# Patient Record
Sex: Male | Born: 1940 | Race: Black or African American | Hispanic: No | Marital: Married | Smoking: Former smoker
Health system: Southern US, Community
[De-identification: ages and names within clinical notes are randomized; demographics above are authoritative.]

## PROBLEM LIST (undated history)

## (undated) ENCOUNTER — Emergency Department (HOSPITAL_COMMUNITY): Disposition: A | Discharge status: Still In Hospital | Payer: TRICARE For Life (TFL)

## (undated) DIAGNOSIS — I2510 Atherosclerotic heart disease of native coronary artery without angina pectoris: Secondary | ICD-10-CM

## (undated) DIAGNOSIS — N390 Urinary tract infection, site not specified: Secondary | ICD-10-CM

## (undated) DIAGNOSIS — I4891 Unspecified atrial fibrillation: Secondary | ICD-10-CM

## (undated) DIAGNOSIS — I739 Peripheral vascular disease, unspecified: Secondary | ICD-10-CM

## (undated) DIAGNOSIS — I82401 Acute embolism and thrombosis of unspecified deep veins of right lower extremity: Secondary | ICD-10-CM

## (undated) DIAGNOSIS — I2699 Other pulmonary embolism without acute cor pulmonale: Secondary | ICD-10-CM

## (undated) DIAGNOSIS — N400 Enlarged prostate without lower urinary tract symptoms: Secondary | ICD-10-CM

## (undated) DIAGNOSIS — I10 Essential (primary) hypertension: Secondary | ICD-10-CM

## (undated) DIAGNOSIS — T849XXA Unspecified complication of internal orthopedic prosthetic device, implant and graft, initial encounter: Secondary | ICD-10-CM

## (undated) DIAGNOSIS — F4310 Post-traumatic stress disorder, unspecified: Secondary | ICD-10-CM

## (undated) DIAGNOSIS — Z7901 Long term (current) use of anticoagulants: Secondary | ICD-10-CM

## (undated) DIAGNOSIS — E785 Hyperlipidemia, unspecified: Secondary | ICD-10-CM

## (undated) DIAGNOSIS — K219 Gastro-esophageal reflux disease without esophagitis: Secondary | ICD-10-CM

## (undated) DIAGNOSIS — I4892 Unspecified atrial flutter: Secondary | ICD-10-CM

## (undated) DIAGNOSIS — K922 Gastrointestinal hemorrhage, unspecified: Secondary | ICD-10-CM

## (undated) DIAGNOSIS — Z993 Dependence on wheelchair: Secondary | ICD-10-CM

## (undated) DIAGNOSIS — K589 Irritable bowel syndrome without diarrhea: Secondary | ICD-10-CM

## (undated) DIAGNOSIS — J449 Chronic obstructive pulmonary disease, unspecified: Secondary | ICD-10-CM

## (undated) DIAGNOSIS — F32A Depression, unspecified: Secondary | ICD-10-CM

## (undated) DIAGNOSIS — G8929 Other chronic pain: Secondary | ICD-10-CM

## (undated) DIAGNOSIS — F329 Major depressive disorder, single episode, unspecified: Secondary | ICD-10-CM

## (undated) DIAGNOSIS — M1990 Unspecified osteoarthritis, unspecified site: Secondary | ICD-10-CM

## (undated) HISTORY — DX: Benign prostatic hyperplasia without lower urinary tract symptoms: N40.0

## (undated) HISTORY — DX: Hyperlipidemia, unspecified: E78.5

## (undated) HISTORY — DX: Unspecified atrial fibrillation: I48.91

## (undated) HISTORY — DX: Major depressive disorder, single episode, unspecified: F32.9

## (undated) HISTORY — DX: Irritable bowel syndrome, unspecified: K58.9

## (undated) HISTORY — DX: Post-traumatic stress disorder, unspecified: F43.10

## (undated) HISTORY — DX: Unspecified osteoarthritis, unspecified site: M19.90

## (undated) HISTORY — PX: COLON RESECTION: SHX5231

## (undated) HISTORY — DX: Unspecified atrial flutter: I48.92

## (undated) HISTORY — DX: Essential (primary) hypertension: I10

## (undated) HISTORY — DX: Peripheral vascular disease, unspecified: I73.9

## (undated) HISTORY — PX: TOTAL KNEE ARTHROPLASTY: SHX125

## (undated) HISTORY — DX: Chronic obstructive pulmonary disease, unspecified: J44.9

## (undated) HISTORY — PX: KNEE ARTHROPLASTY: SHX992

## (undated) HISTORY — PX: TOTAL HIP ARTHROPLASTY: SHX124

## (undated) HISTORY — PX: BACK SURGERY: SHX140

## (undated) HISTORY — PX: PROSTATE SURGERY: SHX751

## (undated) HISTORY — PX: CORONARY ANGIOPLASTY WITH STENT PLACEMENT: SHX49

## (undated) HISTORY — DX: Unspecified complication of internal orthopedic prosthetic device, implant and graft, initial encounter: T84.9XXA

## (undated) HISTORY — DX: Depression, unspecified: F32.A

## (undated) HISTORY — DX: Atherosclerotic heart disease of native coronary artery without angina pectoris: I25.10

## (undated) HISTORY — DX: Urinary tract infection, site not specified: N39.0

## (undated) HISTORY — PX: CATARACT EXTRACTION: SUR2

## (undated) HISTORY — DX: Gastro-esophageal reflux disease without esophagitis: K21.9

## (undated) HISTORY — DX: Gastrointestinal hemorrhage, unspecified: K92.2

---

## 2007-02-06 ENCOUNTER — Ambulatory Visit: Payer: Self-pay | Admitting: Cardiology

## 2007-02-08 ENCOUNTER — Encounter: Payer: Self-pay | Admitting: Cardiology

## 2007-09-20 ENCOUNTER — Encounter: Payer: Self-pay | Admitting: Cardiology

## 2007-09-20 ENCOUNTER — Ambulatory Visit: Payer: Self-pay | Admitting: Cardiology

## 2007-09-25 ENCOUNTER — Ambulatory Visit: Payer: Self-pay | Admitting: Orthopedic Surgery

## 2007-09-25 DIAGNOSIS — M25519 Pain in unspecified shoulder: Secondary | ICD-10-CM

## 2007-09-25 DIAGNOSIS — M169 Osteoarthritis of hip, unspecified: Secondary | ICD-10-CM

## 2007-09-25 DIAGNOSIS — M7580 Other shoulder lesions, unspecified shoulder: Secondary | ICD-10-CM

## 2007-09-25 DIAGNOSIS — M25559 Pain in unspecified hip: Secondary | ICD-10-CM

## 2007-09-25 DIAGNOSIS — M549 Dorsalgia, unspecified: Secondary | ICD-10-CM

## 2008-06-27 ENCOUNTER — Encounter: Payer: Self-pay | Admitting: Cardiology

## 2008-07-17 ENCOUNTER — Encounter: Payer: Self-pay | Admitting: Cardiology

## 2008-07-18 ENCOUNTER — Encounter: Payer: Self-pay | Admitting: Cardiology

## 2008-08-07 ENCOUNTER — Encounter: Payer: Self-pay | Admitting: Cardiology

## 2008-09-25 ENCOUNTER — Encounter: Payer: Self-pay | Admitting: Cardiology

## 2008-11-06 ENCOUNTER — Ambulatory Visit: Payer: Self-pay | Admitting: Cardiology

## 2008-11-19 ENCOUNTER — Ambulatory Visit: Payer: Self-pay | Admitting: Cardiology

## 2008-11-19 ENCOUNTER — Encounter: Payer: Self-pay | Admitting: Cardiology

## 2008-12-13 ENCOUNTER — Encounter: Payer: Self-pay | Admitting: Cardiology

## 2009-01-02 ENCOUNTER — Encounter: Payer: Self-pay | Admitting: Cardiology

## 2009-01-06 ENCOUNTER — Encounter: Admission: RE | Admit: 2009-01-06 | Discharge: 2009-01-06 | Payer: Self-pay | Admitting: Orthopedic Surgery

## 2009-03-13 ENCOUNTER — Encounter: Payer: Self-pay | Admitting: Cardiology

## 2009-03-18 ENCOUNTER — Inpatient Hospital Stay (HOSPITAL_COMMUNITY): Admission: RE | Admit: 2009-03-18 | Discharge: 2009-03-21 | Payer: Self-pay | Admitting: Orthopedic Surgery

## 2009-03-21 ENCOUNTER — Encounter: Payer: Self-pay | Admitting: Cardiology

## 2009-04-01 ENCOUNTER — Ambulatory Visit: Payer: Self-pay | Admitting: Infectious Diseases

## 2009-04-01 DIAGNOSIS — T8489XA Other specified complication of internal orthopedic prosthetic devices, implants and grafts, initial encounter: Secondary | ICD-10-CM

## 2009-04-08 ENCOUNTER — Encounter: Payer: Self-pay | Admitting: Cardiology

## 2009-04-09 ENCOUNTER — Encounter: Payer: Self-pay | Admitting: Cardiology

## 2009-05-09 DIAGNOSIS — E785 Hyperlipidemia, unspecified: Secondary | ICD-10-CM | POA: Insufficient documentation

## 2009-05-09 DIAGNOSIS — I10 Essential (primary) hypertension: Secondary | ICD-10-CM | POA: Insufficient documentation

## 2009-06-02 ENCOUNTER — Encounter: Payer: Self-pay | Admitting: Cardiology

## 2009-06-18 ENCOUNTER — Encounter: Payer: Self-pay | Admitting: Cardiology

## 2009-06-20 ENCOUNTER — Ambulatory Visit: Payer: Self-pay | Admitting: Cardiology

## 2009-06-20 DIAGNOSIS — I4892 Unspecified atrial flutter: Secondary | ICD-10-CM

## 2009-06-20 DIAGNOSIS — I739 Peripheral vascular disease, unspecified: Secondary | ICD-10-CM | POA: Insufficient documentation

## 2009-06-25 ENCOUNTER — Ambulatory Visit: Payer: Self-pay | Admitting: Vascular Surgery

## 2009-10-21 ENCOUNTER — Encounter: Payer: Self-pay | Admitting: Cardiology

## 2009-10-23 ENCOUNTER — Ambulatory Visit (HOSPITAL_COMMUNITY): Admission: RE | Admit: 2009-10-23 | Discharge: 2009-10-23 | Payer: Self-pay | Admitting: Ophthalmology

## 2009-11-19 ENCOUNTER — Encounter: Payer: Self-pay | Admitting: Cardiology

## 2009-12-11 ENCOUNTER — Encounter: Payer: Self-pay | Admitting: Cardiology

## 2009-12-18 ENCOUNTER — Ambulatory Visit (HOSPITAL_COMMUNITY): Admission: RE | Admit: 2009-12-18 | Discharge: 2009-12-18 | Payer: Self-pay | Admitting: Ophthalmology

## 2010-01-06 ENCOUNTER — Ambulatory Visit: Payer: Self-pay | Admitting: Cardiology

## 2010-01-28 ENCOUNTER — Ambulatory Visit: Payer: Self-pay | Admitting: Vascular Surgery

## 2010-03-13 ENCOUNTER — Encounter: Admission: RE | Admit: 2010-03-13 | Discharge: 2010-03-13 | Payer: Self-pay | Admitting: Orthopedic Surgery

## 2010-04-15 ENCOUNTER — Encounter: Payer: Self-pay | Admitting: Cardiology

## 2010-06-09 ENCOUNTER — Inpatient Hospital Stay (HOSPITAL_COMMUNITY): Admission: RE | Admit: 2010-06-09 | Discharge: 2010-06-15 | Payer: Self-pay | Admitting: Orthopedic Surgery

## 2010-06-24 ENCOUNTER — Inpatient Hospital Stay (HOSPITAL_COMMUNITY): Admission: AD | Admit: 2010-06-24 | Discharge: 2010-06-25 | Payer: Self-pay | Admitting: Orthopedic Surgery

## 2010-07-26 ENCOUNTER — Ambulatory Visit (HOSPITAL_COMMUNITY)
Admission: EM | Admit: 2010-07-26 | Discharge: 2010-07-26 | Payer: Self-pay | Source: Home / Self Care | Admitting: Emergency Medicine

## 2010-07-28 ENCOUNTER — Inpatient Hospital Stay (HOSPITAL_COMMUNITY)
Admission: RE | Admit: 2010-07-28 | Discharge: 2010-08-05 | Payer: Self-pay | Source: Home / Self Care | Attending: Orthopedic Surgery | Admitting: Orthopedic Surgery

## 2010-08-17 ENCOUNTER — Inpatient Hospital Stay (HOSPITAL_COMMUNITY)
Admission: EM | Admit: 2010-08-17 | Discharge: 2010-08-24 | Payer: Self-pay | Source: Home / Self Care | Attending: Orthopedic Surgery | Admitting: Orthopedic Surgery

## 2010-08-18 ENCOUNTER — Encounter (INDEPENDENT_AMBULATORY_CARE_PROVIDER_SITE_OTHER): Payer: Self-pay | Admitting: *Deleted

## 2010-08-19 ENCOUNTER — Encounter (INDEPENDENT_AMBULATORY_CARE_PROVIDER_SITE_OTHER): Payer: Self-pay | Admitting: *Deleted

## 2010-08-19 ENCOUNTER — Other Ambulatory Visit: Payer: Self-pay | Admitting: Internal Medicine

## 2010-08-19 ENCOUNTER — Encounter: Payer: Self-pay | Admitting: Cardiology

## 2010-08-19 LAB — BASIC METABOLIC PANEL
BUN: 7 mg/dL (ref 6–23)
CO2: 24 mEq/L (ref 19–32)
Calcium: 9 mg/dL (ref 8.4–10.5)
Chloride: 109 mEq/L (ref 96–112)
Creatinine, Ser: 0.94 mg/dL (ref 0.4–1.5)
GFR calc Af Amer: >60 mL/min (ref >60)
GFR calc non Af Amer: >60 mL/min (ref >60)
Glucose, Bld: 102 mg/dL — ABNORMAL HIGH (ref 70–99)
Potassium: 4.1 mEq/L (ref 3.5–5.1)
Sodium: 138 mEq/L (ref 135–145)

## 2010-08-19 LAB — CBC
HCT: 31.2 % — ABNORMAL LOW (ref 39.0–52.0)
Hemoglobin: 10 g/dL — ABNORMAL LOW (ref 13.0–17.0)
MCH: 30.4 pg (ref 26.0–34.0)
MCHC: 32.1 g/dL (ref 30.0–36.0)
MCV: 94.8 fL (ref 78.0–100.0)
Platelets: 195 10*3/uL (ref 150–400)
RBC: 3.29 MIL/uL — ABNORMAL LOW (ref 4.22–5.81)
RDW: 16.6 % — ABNORMAL HIGH (ref 11.5–15.5)
WBC: 5.3 10*3/uL (ref 4.0–10.5)

## 2010-08-19 LAB — CK TOTAL AND CKMB (NOT AT ARMC)
CK, MB: 1 ng/mL (ref 0.3–4.0)
CK, MB: 1.1 ng/mL (ref 0.3–4.0)
CK, MB: 1.1 ng/mL (ref 0.3–4.0)
Relative Index: INVALID (ref 0.0–2.5)
Relative Index: INVALID (ref 0.0–2.5)
Relative Index: INVALID (ref 0.0–2.5)
Total CK: 21 U/L (ref 7–232)
Total CK: 19 U/L (ref 7–232)
Total CK: 36 U/L (ref 7–232)

## 2010-08-19 LAB — D-DIMER, QUANTITATIVE: D-Dimer, Quant: 1.48 ug/mL-FEU — ABNORMAL HIGH (ref 0.00–0.48)

## 2010-08-19 LAB — TROPONIN I
Troponin I: 0.03 ng/mL (ref 0.00–0.06)
Troponin I: 0.03 ng/mL (ref 0.00–0.06)
Troponin I: 0.03 ng/mL (ref 0.00–0.06)

## 2010-08-19 LAB — TSH: TSH: 0.686 u[IU]/mL (ref 0.350–4.500)

## 2010-08-20 LAB — BASIC METABOLIC PANEL
BUN: 7 mg/dL (ref 6–23)
CO2: 24 mEq/L (ref 19–32)
Calcium: 9.2 mg/dL (ref 8.4–10.5)
Chloride: 109 mEq/L (ref 96–112)
Creatinine, Ser: 0.98 mg/dL (ref 0.4–1.5)
GFR calc Af Amer: >60 mL/min (ref >60)
GFR calc non Af Amer: >60 mL/min (ref >60)
Glucose, Bld: 112 mg/dL — ABNORMAL HIGH (ref 70–99)
Potassium: 3.9 mEq/L (ref 3.5–5.1)
Sodium: 138 mEq/L (ref 135–145)

## 2010-08-20 LAB — CBC
HCT: 27.1 % — ABNORMAL LOW (ref 39.0–52.0)
Hemoglobin: 8.6 g/dL — ABNORMAL LOW (ref 13.0–17.0)
MCH: 30.4 pg (ref 26.0–34.0)
MCHC: 31.7 g/dL (ref 30.0–36.0)
MCV: 95.8 fL (ref 78.0–100.0)
Platelets: 189 10*3/uL (ref 150–400)
RBC: 2.83 MIL/uL — ABNORMAL LOW (ref 4.22–5.81)
RDW: 16.7 % — ABNORMAL HIGH (ref 11.5–15.5)
WBC: 5.4 10*3/uL (ref 4.0–10.5)

## 2010-08-21 LAB — BASIC METABOLIC PANEL
BUN: 7 mg/dL (ref 6–23)
CO2: 27 mEq/L (ref 19–32)
Calcium: 9.4 mg/dL (ref 8.4–10.5)
Chloride: 107 mEq/L (ref 96–112)
Creatinine, Ser: 0.93 mg/dL (ref 0.4–1.5)
GFR calc Af Amer: >60 mL/min (ref >60)
GFR calc non Af Amer: >60 mL/min (ref >60)
Glucose, Bld: 105 mg/dL — ABNORMAL HIGH (ref 70–99)
Potassium: 3.8 mEq/L (ref 3.5–5.1)
Sodium: 139 mEq/L (ref 135–145)

## 2010-08-21 LAB — VANCOMYCIN, TROUGH: Vancomycin Tr: 27.9 ug/mL (ref 10.0–20.0)

## 2010-08-24 ENCOUNTER — Encounter (INDEPENDENT_AMBULATORY_CARE_PROVIDER_SITE_OTHER): Payer: Self-pay | Admitting: *Deleted

## 2010-08-27 ENCOUNTER — Encounter: Payer: Self-pay | Admitting: Cardiology

## 2010-08-31 LAB — CBC
HCT: 28.8 % — ABNORMAL LOW (ref 39.0–52.0)
Hemoglobin: 9.2 g/dL — ABNORMAL LOW (ref 13.0–17.0)
MCH: 30.4 pg (ref 26.0–34.0)
MCHC: 31.9 g/dL (ref 30.0–36.0)
MCV: 95 fL (ref 78.0–100.0)
Platelets: 239 10*3/uL (ref 150–400)
RBC: 3.03 MIL/uL — ABNORMAL LOW (ref 4.22–5.81)
RDW: 16.6 % — ABNORMAL HIGH (ref 11.5–15.5)
WBC: 6.9 10*3/uL (ref 4.0–10.5)

## 2010-08-31 LAB — BASIC METABOLIC PANEL
BUN: 6 mg/dL (ref 6–23)
CO2: 27 mEq/L (ref 19–32)
Calcium: 9.5 mg/dL (ref 8.4–10.5)
Chloride: 107 mEq/L (ref 96–112)
Creatinine, Ser: 0.93 mg/dL (ref 0.4–1.5)
GFR calc Af Amer: >60 mL/min (ref >60)
GFR calc non Af Amer: >60 mL/min (ref >60)
Glucose, Bld: 93 mg/dL (ref 70–99)
Potassium: 3.8 mEq/L (ref 3.5–5.1)
Sodium: 139 mEq/L (ref 135–145)

## 2010-09-12 NOTE — Consult Note (Signed)
NAME:  Bryce Berry, Bryce Berry NO.:  [id number]  MEDICAL RECORD NO.:  [id number]          PATIENT TYPE:  INP  LOCATION:  1432                         FACILITY:  [facility]  PHYSICIAN:  Massie Maroon, MD        DATE OF BIRTH:  04-26-1941  DATE OF CONSULTATION: DATE OF DISCHARGE:                                CONSULTATION   REFERRING PHYSICIAN:  Madlyn Frankel. Charlann Boxer, MD  CHIEF COMPLAINT:  Consultation for tachycardia.  HISTORY OF PRESENT ILLNESS:  A 70 year old male with a history of CAD status post stent, ? atrial flutter, apparently was noted today to be slightly tachycardic in the afternoon.  An EKG was later done this evening and the patient was noted to be in atrial fibrillation with a rate of around 150.  The patient himself was asymptomatic and denied any chest pain, palpitations, shortness of breath, nausea, vomiting.  The patient did note that he had not had his metoprolol today because of surgery and was only now getting his first dose of metoprolol.  The patient's heart rate is slightly better, improved about 135, but obviously still needs better rate control.  PAST MEDICAL HISTORY: 1. Osteoarthritis, COPD/emphysema. 2. Hypertension. 3. Hyperlipidemia. 4. CAD, status post stent. 5. ? atrial flutter. 6. Depression. 7. PTSD 8. GERD. 9. Irritable bowel. 10.BPH. 11.Recurrent UTI. 12.Osteoporosis. 13.PAD (June 02, 2009, apparent occlusion of the anterior tibial     arteries bilaterally).  PAST SURGICAL HISTORY: 1. March 18, 2009, left total hip replacement; June 09, 2010,     revision/reimplantation of right hip total replacement utilizing a     58 Gription Pinnacle cup, 2 cancellous screws, 10 mL of cancellous     bone graft into a superior defect contained a 58 x 36 +4 neutral     Altrx liner a size 19 AML small body stem with a 36 +1.5 ball;     June 25, 2010, closed reduction under anesthesia of right hip     for dislocated right total hip  replacement; July 27, 2010,     closed reduction right dislocated total hip arthroplasty under     anesthesia for dislocated right total hip; July 28, 2010,     closed reduction of right total hip under MAC anesthesia; August 02, 2010, revision right total hip replacement using DePuy size 60     mm deep dish Pinnacle revision cup with 2 dome screws and a 3 rim     screws, a 36 +4, 10 degrees face changing liner, Altrx liner and a     36 +8.5 metal ball; August 17, 2010, closed reduction of right     total hip replacement under anesthesia, incision and drainage of     right hip subcutaneous hematoma. 2. Bilateral total knee arthroplasty. 3. L3-L4 fusion. 4. Colonic resection. 5. Prostate surgery.  SOCIAL HISTORY:  The patient is a former smoker, quit about 20 years ago.  He does not drink.  He is a [place] veteran.  He lives with his wife in [place], [place].  FAMILY HISTORY:  Positive for hypertension.  ALLERGIES: 1.  PENICILLIN. 2. SULFA. 3. CONTRAST MEDIA. 4. OMEPRAZOLE. 5. NITROGLYCERIN. 6. ADHESIVE TAPE.  MEDICATIONS: 1. Albuterol 2 puffs b.i.d. 2. Allopurinol 100 mg p.o. daily. 3. Vitamin C 500 mg p.o. daily. 4. Enteric-coated aspirin 325 mg p.o. daily. 5. Wellbutrin 150 mg p.o. daily. 6. Os-Cal plus D 1 p.o. b.i.d. 7. Vitamin D 1000 international units p.o. daily. 8. Pletal 100 mg p.o. daily. 9. Enablex 15 mg p.o. daily. 10.Colace 100 mg p.o. b.i.d. 11.Zetia 10 mg p.o. q.h.s. 12.Fenofibrate 160 mg p.o. q.h.s. 13.Ferrous sulfate 325 mg p.o. t.i.d. 14.Proscar 5 mg p.o. daily. 15.Folic acid 1 mg p.o. daily. 16.Lisinopril 10 mg p.o. daily. 17.Metoprolol 100 mg p.o. b.i.d. 18.Multivitamin 1 p.o. daily. 19.Lovaza 2 g p.o. daily. 20.Protonix 40 mg p.o. b.i.d. 21.Potassium chloride 20 mEq p.o. daily. 22.Terazosin 5 mg p.o. q.h.s. 23.Vancomycin IV. 24.Tylenol 650 mg p.o./PR q.4 h. p.r.n. 25.Dulcolax 10 mg p.o./PR daily p.r.n. 26.Chloraseptic 1  spray topically p.r.n. 27.Norco 1-2 tablets p.o. q.4 h. p.r.n. 28.Dilaudid 0.5-1 mg IV q.2 h. P.r.n. 29.Maalox 30 mL p.o. q.4 h. p.r.n. 30.Cepacol 1 as needed. 31.Robaxin 500 mg p.o. q.6 h. p.r.n. 32.Reglan 5-10 mg p.o./IV q.8 h. p.r.n. nausea, vomiting. 33.Zofran 4 mg p.o./IV q.6 h. p.r.n. nausea, vomiting. 34.MiraLax 17 g p.o. daily as needed. 35.Compazine 5-10 mg p.o./IV q.6 h. p.r.n. nausea, vomiting. 36.Senokot 1 p.o. q.h.s. p.r.n. 37.Fleet enema as needed. 38.Ambien 5 mg p.o. q.h.s. p.r.n. insomnia.  PHYSICAL EXAMINATION:  VITAL SIGNS:  Temperature 98.3, pulse 130, blood pressure 115/75, pulse ox is 96% on room air. HEENT:  Anicteric. NECK:  No JVD. HEART:  Tachycardic, S1, S2.  No murmurs, gallops, or rubs. LUNGS:  Clear to auscultation bilaterally. ABDOMEN:  Soft, nontender, nondistended.  Positive bowel sounds. EXTREMITIES:  No cyanosis, clubbing, or edema.  LABORATORY DATA:  WBC 5.3, hemoglobin 10.0, platelet count 195.  Sodium 138, potassium 4.1, BUN 7, creatinine 0.94.  ASSESSMENT/PLAN:  New onset atrial fibrillation with rapid ventricular response:  CHADS2 score 1, the patient will be moved to telemetry floor. We will check CK, CK-MB, troponin-I q.6 h. x3 sets.  We will check a TSH and check a cardiac 2-D echo.  We will also check a D-dimer and if positive obtain a CTA chest to rule out pulmonary embolus as the cause of tachycardia.  Most likely reason for his tachycardia is that he missed his metoprolol.  Most likely cause of his atrial fibrillation is the fact that he may have missed his metoprolol doses earlier today in light of the fact that he had to undergo surgery.  The patient will be given digoxin 0.25 mg IV x1 and he is obviously restarted on his metoprolol.  If his heart rate is not well controlled, then we will try to give him an extra dose of metoprolol 2.5 mg IV x1.  In terms of anticoagulation, the patient is presently on aspirin.  We will not  use heparin at this time because of the fact that he has had recently a hematoma.  His wife says that he has also been on Xarelto with bleeding in the past and so we will just continue with aspirin for now.  Please consider cardiology consult with  Cardiology in the a.m. for further evaluation of his atrial fibrillation.     Massie Maroon, MD     JYK/MEDQ  D:  08/19/2010  T:  08/19/2010  Job:  [id number]  cc:   Madlyn Frankel Charlann Boxer, M.D. Fax: [phone]  Luis Abed, MD, [facility] [phone]  [address]  [address] [place] [address]  Dr. Barnett Abu, [facility]  Electronically Signed by Pearson Grippe MD on 09/12/2010 12:01:14 AM

## 2010-09-15 NOTE — Progress Notes (Signed)
Summary: Office Visit/ V V OFFICE VISIT  Office Visit/ V V OFFICE VISIT   Imported By: Dorise Hiss 01/06/2010 10:28:21  _____________________________________________________________________  External Attachment:    Type:   Image     Comment:   External Document

## 2010-09-15 NOTE — Letter (Signed)
Summary: [facility] Surgical Clearance   [facility] Surgical Clearance   Imported By: Roderic Ovens 04/27/2010 12:36:07  _____________________________________________________________________  External Attachment:    Type:   Image     Comment:   External Document

## 2010-09-15 NOTE — Assessment & Plan Note (Signed)
Summary: 6 mo fu -srs   Visit Type:  Follow-up Referring Provider:  Fabienne Bruns, MD Primary Provider:  Dr Sherril Croon [facility]  CC:  CAD.  History of Present Illness: The patient is seen for followup of coronary artery disease.  He was seen last June 20, 2009.  He is stable from the cardiac viewpoint.  Unfortunately he is still not ambulating well related to his orthopedic problems.  We know that he has low normal LV function.  He is not having chest pain he had a Myoview scan in zero 2008 showing no ischemia.  He will be following up with Dr.Fields for his peripheral vascular disease.  Preventive Screening-Counseling & Management  Alcohol-Tobacco     Smoking Status: quit     Year Quit: 2008  Current Medications (verified): 1)  Allopurinol 100 Mg Tabs (Allopurinol) .... Take 1 Tablet By Mouth Once A Day 2)  Zetia 10 Mg Tabs (Ezetimibe) .... Take 1 Tablet By Mouth Once A Day 3)  Metoprolol Tartrate 100 Mg Tabs (Metoprolol Tartrate) .... Take 1 Tablet By Mouth Two Times A Day 4)  Ascorbic Acid 500 Mg Tabs (Ascorbic Acid) .... Take 1 Tablet By Mouth Two Times A Day 5)  Zinc 50 Mg Tabs (Zinc) .... Take 1 Tablet By Mouth Once A Day 6)  Multivitamins  Tabs (Multiple Vitamin) .... Take 1 Tablet By Mouth Once A Day 7)  Mylanta 200-200-20 Mg/95ml Susp (Alum & Mag Hydroxide-Simeth) .... 30ml As Needed 8)  Ramipril 10 Mg Caps (Ramipril) .... Take 1 Tablet By Mouth Once A Day 9)  Protonix 40 Mg Tbec (Pantoprazole Sodium) .... Take 1 Tablet By Mouth Once A Day 10)  Morphine Sulfate 30 Mg Tabs (Morphine Sulfate) .... Take 1 Tablet By Mouth Two Times A Day 11)  Budeprion Sr 150 Mg Xr12h-Tab (Bupropion Hcl) .... Take 1 Tablet By Mouth Once A Day 12)  Finasteride 5 Mg Tabs (Finasteride) .... Take 1 Tablet By Mouth Once A Day 13)  Lasix 20 Mg Tabs (Furosemide) .... Take 1 Tablet By Mouth Once A Day 14)  Terazosin Hcl 5 Mg Caps (Terazosin Hcl) .... Take 1 Tablet By Mouth Once A Day 15)  Robaxin 500 Mg  Tabs (Methocarbamol) .... Take One By Mouth Every Six Hours As Needed 16)  Nitrostat 0.4 Mg Subl (Nitroglycerin) .Marland Kitchen.. 1 Tablet Under Tongue At Onset of Chest Pain; You May Repeat Every 5 Minutes For Up To 3 Doses. 17)  Aspir-Trin 325 Mg Tbec (Aspirin) .... Take 1 Tablet By Mouth Once A Day 18)  Lisinopril 20 Mg Tabs (Lisinopril) .... Take 1 Tablet By Mouth Once A Day  Allergies: 1)  ! Pcn 2)  ! * Paper Tape 3)  ! Sulfa 4)  ! Iodine  Comments:  Nurse/Medical Assistant: The patient's medications and allergies were reviewed with the patient and were updated in the Medication and Allergy Lists. Verbally reviewed with wife.  Past History:  Past Medical History: Last updated: 06/20/2009 Osteoarthritis.  COPD/emphysema.  Depression.  Post-traumatic stress disorder.  Reflux disease.  Irritable bowel syndrome.  Prostate disease.  Recurrent urinary tract infections.  Osteoporosis.  HYPERTENSION, UNSPECIFIED (ICD-401.9) HYPERLIPIDEMIA-MIXED (ICD-272.4) CAD...stents.  [place] in the past and [place]..2000  /   myoview 2008...no ischemia EF  50-55%...echo..11/2008...  mild/mod  LVH Atrial Flutter.Marland KitchenMarland KitchenHx in past with syncope and ???  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  Benign prostatic hypertrophy. total hip.. left.. done.. March 18, 2009 Gout PAD  .Marland Kitchen arterial Doppler of the legs.[facility] office)..06/02/2009.Marland KitchenMarland Kitchenapparent occlusion anterior tibial arteries bilaterally.  Findings suggesting hemodynamically significant stenosis of the proximal right superficial femoral artery in the mid left superficial femoral artery.  Ankle brachial indices indicate moderate stenoses within both lower extremities.  Review of Systems       Patient denies fever, chills, headache, sweats,  rash, change in vision, change in hearing, chest pain, cough, shortness of breath, nausea or vomiting, urinary symptoms.  All of the systems are reviewed and are negative.  Vital Signs:  Patient profile:   70 year old male Height:      74 inches Weight:      280 pounds Pulse rate:   70 / minute BP sitting:   123 / 85  (left arm) Cuff size:   large  Vitals Entered By: Carlye Grippe (Jan 06, 2010 1:17 PM)  Physical Exam  General:  patient is overweight and stable. Eyes:  no xanthelasma. Neck:  no jugular distention. Lungs:  lungs are clear.  Respiratory effort is nonlabored. Heart:  cardiac exam is normal S2.  No clicks or significant murmurs. Abdomen:  abdomen is obese but soft. Extremities:  trace peripheral edema. Skin:  no skin rashes. Psych:  patient is oriented to person time and place.  Affect is normal.   Impression & Recommendations:  Problem # 1:  PERIPHERAL VASCULAR DISEASE (ICD-443.9) The patient has early followup with Dr.Fields.  I will be sending a copy of this note to him.  Problem # 2:  ATRIAL FLUTTER (ICD-427.32)  His updated medication list for this problem includes:    Metoprolol Tartrate 100 Mg Tabs (Metoprolol tartrate) .Marland Kitchen... Take 1 tablet by mouth two times a day    Aspir-trin 325 Mg Tbec (Aspirin) .Marland Kitchen... Take 1 tablet by mouth once a day Rhythm is regular.  He does not need an EKG today.  Problem # 3:  HYPERTENSION, UNSPECIFIED (ICD-401.9) Blood pressure stable.  No change in therapy.  Problem # 4:  CAD, NATIVE VESSEL (ICD-414.01) Coronary disease is stable.  No change in therapy.  Patient Instructions: 1)  Your physician wants you to follow-up in: 6 months. You will receive a reminder letter in the mail one-two months in advance. If you don't receive a letter, please call our office to schedule the follow-up appointment. 2)  Your physician recommends that you continue on your current medications as directed. Please refer to the Current Medication  list given to you today.

## 2010-10-07 NOTE — Consult Note (Signed)
Summary: [facility] Consultation Report  [facility] Consultation Report   Imported By: Earl Many 09/28/2010 09:06:29  _____________________________________________________________________  External Attachment:    Type:   Image     Comment:   External Document

## 2010-10-13 ENCOUNTER — Encounter: Payer: Self-pay | Admitting: Cardiology

## 2010-10-18 ENCOUNTER — Encounter: Payer: Self-pay | Admitting: Cardiology

## 2010-10-19 ENCOUNTER — Ambulatory Visit (INDEPENDENT_AMBULATORY_CARE_PROVIDER_SITE_OTHER): Payer: Medicare Other | Admitting: Cardiology

## 2010-10-19 ENCOUNTER — Encounter: Payer: Self-pay | Admitting: Cardiology

## 2010-10-19 DIAGNOSIS — N259 Disorder resulting from impaired renal tubular function, unspecified: Secondary | ICD-10-CM

## 2010-10-19 DIAGNOSIS — I959 Hypotension, unspecified: Secondary | ICD-10-CM

## 2010-10-19 NOTE — Discharge Summary (Signed)
[name], [name]               ACCOUNT NO.:  [id number]  MEDICAL RECORD NO.:  [id number]          PATIENT TYPE:  INP  LOCATION:  1613                         FACILITY:  [facility]  PHYSICIAN:  Madlyn Frankel. Charlann Boxer, M.D.  DATE OF BIRTH:  Sep 01, 1940  DATE OF ADMISSION:  07/28/2010 DATE OF DISCHARGE:  08/05/2010                              DISCHARGE SUMMARY   ADMITTING DIAGNOSIS:  Unstable revised right total hip replacement with recurrent dislocation.  POSTOPERATIVE DIAGNOSIS:  Recurrent instability of reimplanted right total hip with a right revision right total hip replacement procedure performed.  Additionally, a right hip closed reduction on August 02, 2010, followed by a revision hip surgery on August 02, 2010.  BRIEF HISTORY:  [name] is a 70 year old patient of mine with a history of right hip resected arthroplasty due to infection.  He basically had a 2-year [place] procedure.  After successfully undergoing his left hip replacement, he wished to have his right hip reimplanted.  This was done earlier this year.  He has unfortunately had now 3 dislocations on his right hip.  He presented to the emergency room September 29, 2010, with a third dislocation.  The patient was taken to the operating room.  Plan was to be admitted to the hospital for closed reduction and then subsequently set up for revision of his hip due to recurrent instability.  HOSPITAL COURSE:  The patient was seen and evaluated initially through the emergency room on July 28, 2010, for recurrent dislocation of the right hip.  He was taken to the operating room for a closed reduction and anesthesia that was done on July 28, 2010.  He was subsequently transferred to the hospital floor where he remained stable. The plans were made after discussing with the patient and his wife about revision hip surgery.  Surgery was scheduled as soon as it could possibly be done.  Workup in the interim until  the surgical date included getting his labs stabilized and stopping his aspirin and Plavix for the upcoming surgery.  He was using knee immobilizer for hip precautions.  He was given free reign of physical therapy for activity as tolerated and posterior hip precautions.  By August 02, 2010, he was taken back to the operating room for a revision right total hip replacement.  Please see dictated operative note for full details of the procedure.  He remained in the hospital until August 05, 2010, or the third day postop.  He was placed on posterior hip precautions as well as partial weightbearing at 50%.  He received 2 units of packed red blood cells for a perioperative acute blood loss anemia.  His hemoglobin and hematocrit remained stable.  By postop day #3, he was doing well.  His hip remained dry, and he was ready to go home with therapy as opposed to get back to nursing facility.  DISCHARGE INSTRUCTIONS: 1. To keep his wound as dry as possible. 2. Follow up in 2 weeks in [facility], [phone]. 3. He will be seen and evaluated and worked with physical therapy for     partial weightbearing. 4. He will be on  strict posterior hip precautions.  DISCHARGE MEDICATIONS: 1. Vitamin C 500 mg daily. 2. Norco 7.5/325 one to two tablets p.o. every 4-6 hours as needed for     pain. 3. Xarelto 10 mg daily. 4. Tylenol 325 mg p.o. q.4h. as needed. 5. Albuterol inhaler. 6. Allopurinol 100 mg daily. 7. Aspirin 325 mg daily. 8. Bupropion SR 150 mg daily. 9. Calcium carnitine b.i.d. 10.Colace 100 mg b.i.d. for constipation. 11.Zetia 10 mg at bedtime. 12.Fenofibrate 1 tablet q.a.m. 13.Finasteride 5 mg daily. 14.Fish oil over-the-counter. 15.Folic acid daily. 16.Furosemide p.o. daily 20 mg. 17.Iron 325 mg p.o. 2-3 times a day for anemia. 18.Klor-Con 20 mEq p.o. daily. 19.Lisinopril 10 mg p.o. q.a.m. 20.Toprol 100 mg b.i.d. 21.Multivitamin daily. 22.Robaxin 500 mg p.o.  q.6h. as needed for muscle spasm and pain. 23.Pantoprazole 40 mg p.o. q.a.m. 24.Pletal 100 mg q.a.c. 25.MiraLax 17 g p.o. daily. 26.Vitamin D. 27.VESIcare. 28.Terazosin.     Madlyn Frankel Charlann Boxer, M.D.     MDO/MEDQ  D:  10/16/2010  T:  10/17/2010  Job:  [id number]  Electronically Signed by Durene Romans M.D. on 10/19/2010 09:16:19 AM

## 2010-10-19 NOTE — Discharge Summary (Signed)
  NAMESHELTON, SQUARE               ACCOUNT NO.:  [id number]  MEDICAL RECORD NO.:  [id number]          PATIENT TYPE:  INP  LOCATION:  1613                         FACILITY:  [facility]  PHYSICIAN:  Madlyn Frankel. Charlann Boxer, M.D.  DATE OF BIRTH:  1941-02-11  DATE OF ADMISSION:  06/24/2010 DATE OF DISCHARGE:  06/25/2010                              DISCHARGE SUMMARY   ADMITTING DIAGNOSIS:  Dislocated right total hip replacement.  DISCHARGE DIAGNOSIS:  Dislocated right total hip replacement.  ADMITTING HISTORY:  Bryce Berry is a 70 year old male patient who had presented to the office, who is now at this point about 2 weeks out from a failed right total knee replacement.  He was transferred to a nursing facility, fell out of his bed and dislocated his hip.  He was transferred from [facility] down here.  HOSPITAL COURSE:  The patient was admitted to the hospital, was seen and evaluated that day and taken to the operating room for closed reduction of his right hip.  This was performed and evaluated by Dr. Jene Every.  By postop day #1, he was doing well with no major complications with plan to transfer to his nursing facility.  He was placed in a knee immobilizer postoperatively.  He will have posterior hip precautions. He will continue to work on strength.  He at this point is partial weightbearing at 50%.  We will see him back in the office.  Please note that his right hip staples were removed at the time of visit and his wounds appeared clean and dry at this point.     Madlyn Frankel Charlann Boxer, M.D.     MDO/MEDQ  D:  10/16/2010  T:  10/17/2010  Job:  [id number]  Electronically Signed by Durene Romans M.D. on 10/19/2010 09:16:13 AM

## 2010-10-22 NOTE — Letter (Signed)
Summary: [place] D/C DR. OLIN   [place] D/C DR. OLIN   Imported By: Zachary George 10/16/2010 15:50:13  _____________________________________________________________________  External Attachment:    Type:   Image     Comment:   External Document

## 2010-10-23 ENCOUNTER — Telehealth: Payer: Self-pay | Admitting: Cardiology

## 2010-10-26 DIAGNOSIS — I4891 Unspecified atrial fibrillation: Secondary | ICD-10-CM

## 2010-10-26 LAB — CBC
HCT: 23.6 % — ABNORMAL LOW (ref 39.0–52.0)
HCT: 26.5 % — ABNORMAL LOW (ref 39.0–52.0)
HCT: 27.2 % — ABNORMAL LOW (ref 39.0–52.0)
Hemoglobin: 7.4 g/dL — ABNORMAL LOW (ref 13.0–17.0)
Hemoglobin: 8.7 g/dL — ABNORMAL LOW (ref 13.0–17.0)
Hemoglobin: 9.1 g/dL — ABNORMAL LOW (ref 13.0–17.0)
MCH: 30.3 pg (ref 26.0–34.0)
MCH: 30.5 pg (ref 26.0–34.0)
MCH: 30.6 pg (ref 26.0–34.0)
MCH: 31 pg (ref 26.0–34.0)
MCH: 31 pg (ref 26.0–34.0)
MCH: 31.2 pg (ref 26.0–34.0)
MCH: 31.5 pg (ref 26.0–34.0)
MCHC: 31.4 g/dL (ref 30.0–36.0)
MCHC: 31.6 g/dL (ref 30.0–36.0)
MCHC: 32 g/dL (ref 30.0–36.0)
MCHC: 32 g/dL (ref 30.0–36.0)
MCHC: 32.7 g/dL (ref 30.0–36.0)
MCV: 94.7 fL (ref 78.0–100.0)
MCV: 95 fL (ref 78.0–100.0)
MCV: 95.3 fL (ref 78.0–100.0)
MCV: 95.4 fL (ref 78.0–100.0)
MCV: 98.1 fL (ref 78.0–100.0)
MCV: 98.5 fL (ref 78.0–100.0)
Platelets: 168 10*3/uL (ref 150–400)
Platelets: 177 10*3/uL (ref 150–400)
Platelets: 200 10*3/uL (ref 150–400)
RBC: 2.42 MIL/uL — ABNORMAL LOW (ref 4.22–5.81)
RBC: 2.79 MIL/uL — ABNORMAL LOW (ref 4.22–5.81)
RBC: 3 MIL/uL — ABNORMAL LOW (ref 4.22–5.81)
RBC: 3.43 MIL/uL — ABNORMAL LOW (ref 4.22–5.81)
RDW: 16.7 % — ABNORMAL HIGH (ref 11.5–15.5)
RDW: 16.8 % — ABNORMAL HIGH (ref 11.5–15.5)
RDW: 17 % — ABNORMAL HIGH (ref 11.5–15.5)
RDW: 18.1 % — ABNORMAL HIGH (ref 11.5–15.5)
WBC: 5.9 10*3/uL (ref 4.0–10.5)

## 2010-10-26 LAB — ANAEROBIC CULTURE: Gram Stain: NONE SEEN

## 2010-10-26 LAB — BASIC METABOLIC PANEL
BUN: 11 mg/dL (ref 6–23)
BUN: 11 mg/dL (ref 6–23)
CO2: 22 mEq/L (ref 19–32)
CO2: 26 mEq/L (ref 19–32)
CO2: 28 mEq/L (ref 19–32)
CO2: 28 mEq/L (ref 19–32)
CO2: 29 mEq/L (ref 19–32)
Calcium: 8.4 mg/dL (ref 8.4–10.5)
Calcium: 9.4 mg/dL (ref 8.4–10.5)
Calcium: 9.5 mg/dL (ref 8.4–10.5)
Chloride: 106 mEq/L (ref 96–112)
Chloride: 107 mEq/L (ref 96–112)
Chloride: 107 mEq/L (ref 96–112)
Chloride: 109 mEq/L (ref 96–112)
Creatinine, Ser: 1.06 mg/dL (ref 0.4–1.5)
Creatinine, Ser: 1.08 mg/dL (ref 0.4–1.5)
GFR calc Af Amer: >60 mL/min (ref >60)
GFR calc Af Amer: >60 mL/min (ref >60)
GFR calc Af Amer: >60 mL/min (ref >60)
GFR calc Af Amer: >60 mL/min (ref >60)
GFR calc Af Amer: >60 mL/min (ref >60)
GFR calc non Af Amer: 53 mL/min — ABNORMAL LOW (ref >60)
Glucose, Bld: 102 mg/dL — ABNORMAL HIGH (ref 70–99)
Glucose, Bld: 104 mg/dL — ABNORMAL HIGH (ref 70–99)
Potassium: 3.9 mEq/L (ref 3.5–5.1)
Potassium: 4 mEq/L (ref 3.5–5.1)
Sodium: 138 mEq/L (ref 135–145)
Sodium: 138 mEq/L (ref 135–145)

## 2010-10-26 LAB — DIFFERENTIAL
Basophils Absolute: 0 10*3/uL (ref 0.0–0.1)
Lymphocytes Relative: 16 % (ref 12–46)
Lymphs Abs: 1.2 10*3/uL (ref 0.7–4.0)
Monocytes Absolute: 0.5 10*3/uL (ref 0.1–1.0)
Neutro Abs: 5.5 10*3/uL (ref 1.7–7.7)

## 2010-10-26 LAB — APTT: aPTT: 37 seconds (ref 24–37)

## 2010-10-26 LAB — WOUND CULTURE: Gram Stain: NONE SEEN

## 2010-10-26 LAB — TYPE AND SCREEN
ABO/RH(D): O NEG
ABO/RH(D): O NEG
Unit division: 0

## 2010-10-26 LAB — COMPREHENSIVE METABOLIC PANEL
Albumin: 2.5 g/dL — ABNORMAL LOW (ref 3.5–5.2)
BUN: 14 mg/dL (ref 6–23)
Calcium: 8.9 mg/dL (ref 8.4–10.5)
Chloride: 107 mEq/L (ref 96–112)
Creatinine, Ser: 1.12 mg/dL (ref 0.4–1.5)
Total Bilirubin: 0.5 mg/dL (ref 0.3–1.2)
Total Protein: 6.3 g/dL (ref 6.0–8.3)

## 2010-10-26 LAB — PREPARE RBC (CROSSMATCH)

## 2010-10-26 LAB — GRAM STAIN: Gram Stain: NONE SEEN

## 2010-10-26 LAB — PROTIME-INR: Prothrombin Time: 15.7 seconds — ABNORMAL HIGH (ref 11.6–15.2)

## 2010-10-27 LAB — BASIC METABOLIC PANEL
BUN: 12 mg/dL (ref 6–23)
CO2: 23 mEq/L (ref 19–32)
CO2: 29 mEq/L (ref 19–32)
Calcium: 9.2 mg/dL (ref 8.4–10.5)
Chloride: 104 mEq/L (ref 96–112)
Chloride: 109 mEq/L (ref 96–112)
Creatinine, Ser: 1.1 mg/dL (ref 0.4–1.5)
Glucose, Bld: 103 mg/dL — ABNORMAL HIGH (ref 70–99)
Glucose, Bld: 93 mg/dL (ref 70–99)
Sodium: 140 mEq/L (ref 135–145)

## 2010-10-27 LAB — URINALYSIS, ROUTINE W REFLEX MICROSCOPIC
Glucose, UA: NEGATIVE mg/dL
Ketones, ur: NEGATIVE mg/dL
Protein, ur: NEGATIVE mg/dL

## 2010-10-27 LAB — DIFFERENTIAL
Basophils Absolute: 0 10*3/uL (ref 0.0–0.1)
Eosinophils Absolute: 0.3 10*3/uL (ref 0.0–0.7)
Eosinophils Relative: 5 % (ref 0–5)
Lymphs Abs: 1.3 10*3/uL (ref 0.7–4.0)

## 2010-10-27 LAB — CROSSMATCH: Unit division: 0

## 2010-10-27 LAB — CBC
HCT: 23.1 % — ABNORMAL LOW (ref 39.0–52.0)
HCT: 31.4 % — ABNORMAL LOW (ref 39.0–52.0)
Hemoglobin: 10 g/dL — ABNORMAL LOW (ref 13.0–17.0)
MCH: 31.1 pg (ref 26.0–34.0)
MCH: 31.4 pg (ref 26.0–34.0)
MCHC: 31.8 g/dL (ref 30.0–36.0)
MCV: 95.7 fL (ref 78.0–100.0)
MCV: 97.9 fL (ref 78.0–100.0)
MCV: 98.7 fL (ref 78.0–100.0)
Platelets: 176 10*3/uL (ref 150–400)
RBC: 2.42 MIL/uL — ABNORMAL LOW (ref 4.22–5.81)
RDW: 16.8 % — ABNORMAL HIGH (ref 11.5–15.5)
WBC: 6.7 10*3/uL (ref 4.0–10.5)

## 2010-10-27 NOTE — Miscellaneous (Signed)
  Clinical Lists Changes  Observations: Added new observation of PAST MED HX: Osteoarthritis.  COPD/emphysema.  Depression.  Post-traumatic stress disorder.  Reflux disease.  Irritable bowel syndrome.  Prostate disease.  Recurrent urinary tract infections.  Osteoporosis.  HYPERTENSION, UNSPECIFIED (ICD-401.9) HYPERLIPIDEMIA-MIXED (ICD-272.4) CAD...stents.  [place] in the past and [place]..2000  /   myoview 2008...no ischemia EF  50-55%...echo..11/2008...  mild/mod  LVH  /   50-55%...echo...08/19/2010 Atrial Flutter.Marland KitchenMarland KitchenHx in past with syncope and ???  ablation consideration...Marland Kitchenno records Atrial Fib.    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  Benign prostatic hypertrophy. total hip.. left.. done.. March 18, 2009 Gout PAD  .Marland Kitchen arterial Doppler of the legs.[facility] office)..06/02/2009.Marland KitchenMarland Kitchenapparent occlusion anterior tibial arteries bilaterally.  Findings suggesting hemodynamically significant stenosis of the proximal right superficial femoral artery in the mid left superficial femoral artery.  Ankle brachial indices indicate moderate stenoses within both lower extremities. (10/18/2010 17:01) Added new observation of REFERRING MD: Fabienne Bruns, MD (10/18/2010 17:01) Added new observation of PRIMARY MD: Dr Sherril Croon - [facility] (10/18/2010 17:01)       Past History:  Past Medical History: Osteoarthritis.  COPD/emphysema.  Depression.  Post-traumatic stress disorder.  Reflux disease.  Irritable bowel syndrome.  Prostate disease.  Recurrent urinary tract infections.  Osteoporosis.  HYPERTENSION, UNSPECIFIED (ICD-401.9) HYPERLIPIDEMIA-MIXED (ICD-272.4) CAD...stents.  [place] in the past and ..2000  /   myoview 2008...no ischemia EF  50-55%...echo..11/2008...   mild/mod  LVH  /   50-55%...echo...08/19/2010 Atrial Flutter.Marland KitchenMarland KitchenHx in past with syncope and ???  ablation consideration...Marland Kitchenno records Atrial Fib.    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  Benign prostatic hypertrophy. total hip.. left.. done.. March 18, 2009 Gout PAD  .Marland Kitchen arterial Doppler of the legs.[facility] office)..06/02/2009.Marland KitchenMarland Kitchenapparent occlusion anterior tibial arteries bilaterally.  Findings suggesting hemodynamically significant stenosis of the proximal right superficial femoral artery in the mid left superficial femoral artery.  Ankle brachial indices indicate moderate stenoses within both lower extremities.

## 2010-10-27 NOTE — Assessment & Plan Note (Signed)
Summary: EPH-POST [place] 1/8 HIP SURGERY AFIB OCCURED PER WIFE PC...   Visit Type:  post hospital visit Referring Provider:  Fabienne Bruns, MD Primary Provider:  Dr Sherril Croon [facility]  CC:  atrial fibrillation.  History of Present Illness: The patient is seen post hospitalization.  He has been hospitalized on several occasions because of problems with his hip prosthesis.  In January, 2012, it was noted while he was in the hospital he had some atrial fibrillation postoperatively.  He was placed on digoxin and diltiazem.  The patient's wife tells me today that the rhythm reverted to sinus in the hospital.  This issue was not addressed in the discharge summary.  Since being at home he has had some feelings of "jittering in his abdomen."  He does not describe actual palpitations.  Blood was drawn on December 28 and his creatinine was 2.  He was instructed today to hold his lisinopril and diuretic.  In fact his wife and already held his diuretic or 3 days.  He's also had some mild nausea.  His blood pressure was lower than usual at home.  This is the reason why his wife had held medications.  Today he's not having any chest pain or shortness of breath.  While in the hospital in January, 2012, two-dimensional echo revealed an ejection fraction of 50-55% with no focal wall motion abnormalities.  Preventive Screening-Counseling & Management  Alcohol-Tobacco     Smoking Status: quit     Year Quit: 2008  Current Medications (verified): 1)  Allopurinol 100 Mg Tabs (Allopurinol) .... Take 1 Tablet By Mouth Once A Day 2)  Metoprolol Tartrate 100 Mg Tabs (Metoprolol Tartrate) .... Take 1 Tablet By Mouth Two Times A Day 3)  Ascorbic Acid 500 Mg Tabs (Ascorbic Acid) .... Take 1 Tablet By Mouth Two Times A Day 4)  Multivitamins  Tabs (Multiple Vitamin) .... Take 1 Tablet By Mouth Once A Day 5)  Mylanta 200-200-20 Mg/58ml Susp (Alum & Mag Hydroxide-Simeth) .... 30ml As Needed 6)  Protonix 40 Mg Tbec  (Pantoprazole Sodium) .... Take 1 Tablet By Mouth Once A Day 7)  Hydrocodone-Acetaminophen 7.5-325 Mg Tabs (Hydrocodone-Acetaminophen) .... Take 1-2 By Mouth Every 4-6 Hours As Needed Pain 8)  Finasteride 5 Mg Tabs (Finasteride) .... Take 1 Tablet By Mouth Once A Day 9)  Lasix 20 Mg Tabs (Furosemide) .... Take 1 Tablet By Mouth Once A Day(Hold For 3 Days Per Vyas) 10)  Terazosin Hcl 5 Mg Caps (Terazosin Hcl) .... Take 1 Tablet By Mouth Once A Day 11)  Robaxin 500 Mg Tabs (Methocarbamol) .... Take One By Mouth Every Six Hours As Needed 12)  Nitrostat 0.4 Mg Subl (Nitroglycerin) .Marland Kitchen.. 1 Tablet Under Tongue At Onset of Chest Pain; You May Repeat Every 5 Minutes For Up To 3 Doses. 13)  Aspir-Trin 325 Mg Tbec (Aspirin) .... Take 1 Tablet By Mouth Once A Day 14)  Lisinopril 10 Mg Tabs (Lisinopril) .... Take 1 Tablet By Mouth Once A Day 15)  Folic Acid 1 Mg Tabs (Folic Acid) .... Take 1 Tablet By Mouth Once A Day For 30 Days 16)  Klor-Con M20 20 Meq Cr-Tabs (Potassium Chloride Crys Cr) .... Take 1 Tablet By Mouth Once A Day 17)  Digoxin 0.125 Mg Tabs (Digoxin) .... Take 1 Tablet By Mouth Once A Day 18)  Cardizem Cd 120 Mg Xr24h-Cap (Diltiazem Hcl Coated Beads) .... Take 1 Tablet By Mouth Once A Day 19)  Fish Oil 1000 Mg Caps (Omega-3  Fatty Acids) .... Take 1 Tablet By Mouth Three Times A Day 20)  Oscal 500/200 D-3 500-200 Mg-Unit Tabs (Calcium Carbonate-Vitamin D) .... Take 1 Tablet By Mouth Two Times A Day 21)  Ironsorb 18mg  .... Take 1 Tablet By Mouth Once A Day 22)  Bactrim Ds 800-160 Mg Tabs (Sulfamethoxazole-Trimethoprim) .... Take 1 Tablet By Mouth Two Times A Day 23)  Doxycycline Hyclate 100 Mg Solr (Doxycycline Hyclate) .... Take 1 Tablet By Mouth Two Times A Day  Allergies (verified): 1)  ! Pcn 2)  ! * Paper Tape 3)  ! Sulfa 4)  ! Iodine  Comments:  Nurse/Medical Assistant: The patient's medication bottles and allergies were reviewed with the patient and were updated in the Medication  and Allergy Lists.  Past History:  Past Medical History: Osteoarthritis.  COPD/emphysema.  Depression.  Post-traumatic stress disorder.  Reflux disease.  Irritable bowel syndrome.  Prostate disease.  Recurrent urinary tract infections.  Osteoporosis.  HYPERTENSION, UNSPECIFIED (ICD-401.9) HYPERLIPIDEMIA-MIXED (ICD-272.4) CAD...stents.  [place] in the past and [place]..2000  /   myoview 2008...no ischemia EF  50-55%...echo..11/2008...  mild/mod  LVH  /   50-55%...echo...08/19/2010 Atrial Flutter.Marland KitchenMarland KitchenHx in past with syncope and ???  ablation consideration...Marland Kitchenno records Atrial Fib.    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  Benign prostatic hypertrophy. total hip.. left.. done.. March 18, 2009 Gout PAD  .Marland Kitchen arterial Doppler of the legs.[facility] office)..06/02/2009.Marland KitchenMarland Kitchenapparent occlusion anterior tibial arteries bilaterally.  Findings suggesting hemodynamically significant stenosis of the proximal right superficial femoral artery in the mid left superficial femoral artery.  Ankle brachial indices indicate moderate stenoses within both lower extremities. CKD   creatinine 2.1  ... October 13, 2010  Review of Systems       Patient denies fever, chills, headache, sweats, rash, change in vision, change in hearing, chest pain, cough, urinary symptoms.  All other systems are reviewed and are negative other than the history of present illness.  Vital Signs:  Patient profile:   70 year old male Height:      74 inches Weight:      264 pounds BMI:     34.02 Pulse rate:   87 / minute BP sitting:   119 / 79  (left arm) Cuff size:   large  Vitals Entered By: Carlye Grippe (October 19, 2010 10:02 AM)  Nutrition Counseling: Patient's BMI is greater than 25 and therefore  counseled on weight management options. CC: atrial fibrillation   Physical Exam  General:  patient is overweight but stable today.  He comes in with a wheelchair. Head:  head is atraumatic. Eyes:  no xanthelasma. Neck:  no jugular venous distention. Chest Wall:  no chest wall tenderness. Lungs:  lungs are clear.  Respiratory effort is nonlabored. Heart:  cardiac exam reveals an S1 and S2.  The rhythm is regular. Abdomen:  abdomen is soft. Msk:  no musculoskeletal deformities. Extremities:  no peripheral edema. Skin:  no skin rashes. Psych:  patient is oriented to person time and place.  Affect is normal.  He is here with his wife today.   Impression & Recommendations:  Problem # 1:  RENAL INSUFFICIENCY (ICD-588.9)  The patient's most recent creatinine is 2.0.  On this basis his primary care team has suggested that he hold his lisinopril and Lasix.  His Lasix has actually been held already  for 3 days.  He will have a followup chemistry test today and this was all be sent to his primary physician.  I have suggested that he continue to remain off lisinopril.  Orders: T-Basic Metabolic Panel ([id number])  Problem # 2:  HYPERTENSION, UNSPECIFIED (ICD-401.9) The patient's most recent blood pressures at home have been low and not high.  Cardizem is a new medication since the hospital.  Therefore I would continue to hold his lisinopril to see the response with his renal function and blood pressure.  Problem # 3:  ATRIAL FIBRILLATION, HX OF (ICD-V12.59)  Atrial fibrillation was documented in the hospital in January, 2012.  By history he converted to regular rhythm in the hospital.  The atrial fib occurred postoperatively.  The patient is not anticoagulated.  He is on aspirin.  EKG is done today and reviewed by me.  There is sinus rhythm.  No medications include Cardizem and digoxin.  He has had some nausea and his creatinine is 2.0.  I've chosen to check a digoxin level today and stop the  digoxin.  He will be kept on Cardizem.  His wife describes palpitations that seem to come and go at home.  He may be going in and out of a total fibrillation.  If this is the case he will need to be anticoagulated.  An event recorder will be obtained and we'll see him back for followup.  Orders: T-Digoxin ([id number]) Cardionet/Event Monitor (Cardionet/Event)  Problem # 4:  CAD, NATIVE VESSEL (ICD-414.01) Coronary disease is stable.  During his hospitalization his ejection fraction was 50-55% with no focal wall motion abnormalities.  EKG is done today and reviewed by me.  There is no significant change in the QRS.  As part of the evaluation today I have reviewed the patient's recent notes and his hospital records.  I have also reviewed the labs drawn on October 14, 2010.  I spent an extensive amount of time reviewing old records and discussing all the issues with the patient and his wife.  Other Orders: EKG w/ Interpretation (93000)  Patient Instructions: 1)  Follow up with Dr. Myrtis Ser on  TUESDAY November 17, 2010 AT 2PM. 2)  Your physician recommends that you go to the [facility] for lab work: DO TODAY. 3)  Your physician has recommended that you wear an event monitor.  Event monitors are medical devices that record the heart's electrical activity. Doctors most often use these monitors to diagnose arrhythmias. Arrhythmias are problems with the speed or rhythm of the heartbeat. The monitor is a small, portable device. You can wear one while you do your normal daily activities. This is usually used to diagnose what is causing palpitations/syncope (passing out). 4)  Hold Digoxin. 5)  Continue to hold Lisinopril. 6)  You may resume Lasix (furosemide).

## 2010-10-28 LAB — TYPE AND SCREEN
Antibody Screen: NEGATIVE
Unit division: 0
Unit division: 0

## 2010-10-28 LAB — URINALYSIS, ROUTINE W REFLEX MICROSCOPIC
Glucose, UA: NEGATIVE mg/dL
Hgb urine dipstick: NEGATIVE
Ketones, ur: NEGATIVE mg/dL
Protein, ur: NEGATIVE mg/dL
pH: 7 (ref 5.0–8.0)

## 2010-10-28 LAB — BASIC METABOLIC PANEL
BUN: 11 mg/dL (ref 6–23)
CO2: 26 mEq/L (ref 19–32)
CO2: 27 mEq/L (ref 19–32)
CO2: 29 mEq/L (ref 19–32)
Calcium: 9.1 mg/dL (ref 8.4–10.5)
Chloride: 110 mEq/L (ref 96–112)
Chloride: 111 mEq/L (ref 96–112)
Creatinine, Ser: 1.02 mg/dL (ref 0.4–1.5)
Creatinine, Ser: 1.13 mg/dL (ref 0.4–1.5)
Creatinine, Ser: 1.33 mg/dL (ref 0.4–1.5)
GFR calc Af Amer: >60 mL/min (ref >60)
GFR calc Af Amer: >60 mL/min (ref >60)
Potassium: 3.4 mEq/L — ABNORMAL LOW (ref 3.5–5.1)
Potassium: 4.3 mEq/L (ref 3.5–5.1)
Sodium: 135 mEq/L (ref 135–145)

## 2010-10-28 LAB — SURGICAL PCR SCREEN
MRSA, PCR: NEGATIVE
Staphylococcus aureus: NEGATIVE
Staphylococcus aureus: POSITIVE — AB

## 2010-10-28 LAB — CBC
HCT: 20.7 % — ABNORMAL LOW (ref 39.0–52.0)
HCT: 26.1 % — ABNORMAL LOW (ref 39.0–52.0)
Hemoglobin: 8.8 g/dL — ABNORMAL LOW (ref 13.0–17.0)
MCH: 27 pg (ref 26.0–34.0)
MCH: 29 pg (ref 26.0–34.0)
MCH: 29.4 pg (ref 26.0–34.0)
MCHC: 32.1 g/dL (ref 30.0–36.0)
MCV: 84.1 fL (ref 78.0–100.0)
MCV: 85.8 fL (ref 78.0–100.0)
MCV: 86.4 fL (ref 78.0–100.0)
Platelets: 173 10*3/uL (ref 150–400)
Platelets: 96 10*3/uL — ABNORMAL LOW (ref 150–400)
RBC: 3.02 MIL/uL — ABNORMAL LOW (ref 4.22–5.81)
RDW: 17.1 % — ABNORMAL HIGH (ref 11.5–15.5)
RDW: 18.8 % — ABNORMAL HIGH (ref 11.5–15.5)
WBC: 5.7 10*3/uL (ref 4.0–10.5)
WBC: 5.9 10*3/uL (ref 4.0–10.5)

## 2010-10-28 LAB — POCT I-STAT 4, (NA,K, GLUC, HGB,HCT)
HCT: 27 % — ABNORMAL LOW (ref 39.0–52.0)
Sodium: 140 mEq/L (ref 135–145)

## 2010-10-28 LAB — URINE MICROSCOPIC-ADD ON

## 2010-10-28 LAB — DIFFERENTIAL
Basophils Absolute: 0 10*3/uL (ref 0.0–0.1)
Eosinophils Absolute: 0.3 10*3/uL (ref 0.0–0.7)
Eosinophils Relative: 4 % (ref 0–5)
Monocytes Absolute: 0.4 10*3/uL (ref 0.1–1.0)

## 2010-10-28 LAB — PROTIME-INR: Prothrombin Time: 15.5 seconds — ABNORMAL HIGH (ref 11.6–15.2)

## 2010-10-28 LAB — HEMOGLOBIN AND HEMATOCRIT, BLOOD: HCT: 20.3 % — ABNORMAL LOW (ref 39.0–52.0)

## 2010-11-03 NOTE — Progress Notes (Signed)
Summary: Increased HR  Phone Note Call from Patient Call back at Home Phone [phone] Call back at [phone]   Summary of Call: Pt's wife called concerned about pt's heart rate. She staes it has been has high as 120's and rarely gets below 100. She states he has not complain of palpitations. She does state she can feel his heart irregular at times but not all of the time. Pt's monitor should arrive on Monday. Pt's wife is concerned about HR being elevated all the time.  Initial call taken by: Cyril Loosen, RN, BSN,  October 23, 2010 8:34 AM  Follow-up for Phone Call        Please call later today to see how pt. is and if monitor arrived. Talitha Givens, MD, [facility]  October 26, 2010 10:26 AM  Left message to call back on voicemail. Cyril Loosen, RN, BSN  October 26, 2010 4:49 PM  Pt's wife called back and states HR seems a little better but still not staying below 100. Pt denies any chest pain, SOB, dizziness or other problems. He is currently wearing monitor. According to his wife, they received it today.  Follow-up by: Cyril Loosen, RN, BSN,  October 26, 2010 4:58 PM     Appended Document: Increased HR Let's see what monitor shows.

## 2010-11-09 LAB — BASIC METABOLIC PANEL
CO2: 25 mEq/L (ref 19–32)
Chloride: 103 mEq/L (ref 96–112)
Creatinine, Ser: 1.71 mg/dL — ABNORMAL HIGH (ref 0.4–1.5)
GFR calc Af Amer: 49 mL/min — ABNORMAL LOW (ref >60)
Glucose, Bld: 111 mg/dL — ABNORMAL HIGH (ref 70–99)
Sodium: 134 mEq/L — ABNORMAL LOW (ref 135–145)

## 2010-11-09 LAB — HEMOGLOBIN AND HEMATOCRIT, BLOOD: HCT: 31.6 % — ABNORMAL LOW (ref 39.0–52.0)

## 2010-11-17 ENCOUNTER — Ambulatory Visit (INDEPENDENT_AMBULATORY_CARE_PROVIDER_SITE_OTHER): Payer: Medicare Other | Admitting: Internal Medicine

## 2010-11-17 ENCOUNTER — Encounter: Payer: Self-pay | Admitting: Cardiology

## 2010-11-17 ENCOUNTER — Other Ambulatory Visit (INDEPENDENT_AMBULATORY_CARE_PROVIDER_SITE_OTHER): Payer: Self-pay | Admitting: Internal Medicine

## 2010-11-17 ENCOUNTER — Ambulatory Visit (INDEPENDENT_AMBULATORY_CARE_PROVIDER_SITE_OTHER): Payer: TRICARE For Life (TFL) | Admitting: Cardiology

## 2010-11-17 ENCOUNTER — Encounter: Payer: Self-pay | Admitting: *Deleted

## 2010-11-17 DIAGNOSIS — E785 Hyperlipidemia, unspecified: Secondary | ICD-10-CM | POA: Insufficient documentation

## 2010-11-17 DIAGNOSIS — R112 Nausea with vomiting, unspecified: Secondary | ICD-10-CM

## 2010-11-17 DIAGNOSIS — N189 Chronic kidney disease, unspecified: Secondary | ICD-10-CM | POA: Insufficient documentation

## 2010-11-17 DIAGNOSIS — K219 Gastro-esophageal reflux disease without esophagitis: Secondary | ICD-10-CM

## 2010-11-17 DIAGNOSIS — I10 Essential (primary) hypertension: Secondary | ICD-10-CM | POA: Insufficient documentation

## 2010-11-17 DIAGNOSIS — I4891 Unspecified atrial fibrillation: Secondary | ICD-10-CM

## 2010-11-17 DIAGNOSIS — N289 Disorder of kidney and ureter, unspecified: Secondary | ICD-10-CM | POA: Insufficient documentation

## 2010-11-17 DIAGNOSIS — I739 Peripheral vascular disease, unspecified: Secondary | ICD-10-CM | POA: Insufficient documentation

## 2010-11-17 DIAGNOSIS — I2510 Atherosclerotic heart disease of native coronary artery without angina pectoris: Secondary | ICD-10-CM

## 2010-11-17 DIAGNOSIS — M109 Gout, unspecified: Secondary | ICD-10-CM | POA: Insufficient documentation

## 2010-11-17 DIAGNOSIS — I4892 Unspecified atrial flutter: Secondary | ICD-10-CM

## 2010-11-17 DIAGNOSIS — F4310 Post-traumatic stress disorder, unspecified: Secondary | ICD-10-CM | POA: Insufficient documentation

## 2010-11-17 DIAGNOSIS — J449 Chronic obstructive pulmonary disease, unspecified: Secondary | ICD-10-CM | POA: Insufficient documentation

## 2010-11-17 MED ORDER — DILTIAZEM HCL ER COATED BEADS 240 MG PO CP24: 240.0000 mg | ORAL_CAPSULE | Freq: Every day | ORAL | Status: DC

## 2010-11-17 NOTE — Assessment & Plan Note (Signed)
At this time the event recorder shows that the patient does have a burden of atrial fibrillation.  He had several hours of atrial fib on 3 or 4 days out of 10.  He does not have marked symptoms with this.  Currently his CHADS2 score is one.  I discussed that we would consider Coumadin over time but it is not an absolute necessity of the point.  I chosen not to put him back on digoxin.  His blood pressure had been on the low side we held lisinopril and will now be stopped.  His Cardizem dose was increased.  I will then see him for follow.

## 2010-11-17 NOTE — Assessment & Plan Note (Signed)
There was no definite atrial flutter seen on the event recorder.

## 2010-11-17 NOTE — Patient Instructions (Signed)
   Stop Lisinopril  Stop Digoxin  Increase Cardizem (diltiazem) to 240mg  daily. You may take 2 of your 120mg  capsules until completed and then get new prescription for 240mg  capsules.  Return heart monitor.

## 2010-11-17 NOTE — Progress Notes (Signed)
HPI The patient is seen for followup of atrial fibrillation.  I saw him last on October 19, 2010.  An event recorder was placed to see if I could get a handle on the amount of atrial fib that he has.  We had seen that he has atrial fibrillation in the hospital.  I reviewed the event recorder completely today.  It shows that he does have intermittent atrial fibrillation.  He has variation in his heart rates both when he has atrial fib and when he is in sinus rhythm.  The overall volume of his atrial fibrillation shows that he has in the range of 24 hours on 3/10 days.  We cannot tell he has any particular symptoms with this.  Allergies  Allergen Reactions  . Iodine     REACTION: rash  . Penicillins   . Sulfonamide Derivatives     Current Outpatient Prescriptions  Medication Sig Dispense Refill  . allopurinol (ZYLOPRIM) 100 MG tablet Take 100 mg by mouth daily.        Marland Kitchen alum & mag hydroxide-simeth (MYLANTA) 200-200-20 MG/5ML suspension Take by mouth as needed.        Marland Kitchen ascorbic acid (VITAMIN C) 500 MG tablet Take 500 mg by mouth 2 (two) times daily.        Marland Kitchen aspirin 325 MG tablet Take 325 mg by mouth daily.        . calcium-vitamin D (OSCAL WITH D) 500-200 MG-UNIT per tablet Take 1 tablet by mouth 2 (two) times daily.        Marland Kitchen diltiazem (CARDIZEM CD) 120 MG 24 hr capsule Take 120 mg by mouth daily.        Marland Kitchen doxycycline (DORYX) 100 MG EC tablet Take 100 mg by mouth 2 (two) times daily.        . Ferrous Fumarate (IRON) 18 MG TBCR Take 1 tablet by mouth daily.        . finasteride (PROSCAR) 5 MG tablet Take 5 mg by mouth daily.        . furosemide (LASIX) 20 MG tablet Take 20 mg by mouth daily.        Marland Kitchen HYDROcodone-acetaminophen (NORCO) 7.5-325 MG per tablet Take 1-2 tablets by mouth every 4-6 hours as needed pain.       . methocarbamol (ROBAXIN) 500 MG tablet Take 500 mg by mouth every 6 (six) hours as needed.        . metoprolol (LOPRESSOR) 100 MG tablet Take 100 mg by mouth 2 (two) times daily.         . Multiple Vitamin (MULTIVITAMIN) tablet Take 1 tablet by mouth daily.        . nitroGLYCERIN (NITROSTAT) 0.4 MG SL tablet Place 0.4 mg under the tongue every 5 (five) minutes as needed.        . Omega-3 Fatty Acids (FISH OIL) 1000 MG CAPS Take 1 capsule by mouth 3 (three) times daily.        . pantoprazole (PROTONIX) 40 MG tablet Take 40 mg by mouth daily.        . potassium chloride SA (K-DUR,KLOR-CON) 20 MEQ tablet Take 20 mEq by mouth daily.        Marland Kitchen sulfamethoxazole-trimethoprim (BACTRIM DS,SEPTRA DS) 800-160 MG per tablet Take 1 tablet by mouth 2 (two) times daily.        Marland Kitchen terazosin (HYTRIN) 5 MG capsule Take 5 mg by mouth daily.        Marland Kitchen lisinopril (PRINIVIL,ZESTRIL) 10 MG  tablet Take 10 mg by mouth daily.          History   Social History  . Marital Status: Married    Spouse Name: N/A    Number of Children: N/A  . Years of Education: N/A   Occupational History  . Paramedic     Retired   Social History Main Topics  . Smoking status: Former Smoker    Quit date: 08/16/2006  . Smokeless tobacco: Not on file  . Alcohol Use: No  . Drug Use: Not on file  . Sexually Active: Not on file   Other Topics Concern  . Not on file   Social History Narrative  . No narrative on file    No family history on file.  Past Medical History  Diagnosis Date  . Atrial fibrillation     Postop hip surgery, January, 2012, digoxin and diltiazem, reverted to sinus in the hospital  . Renal insufficiency     Creatinine 2.0  ??  . Osteoarthritis   . COPD (chronic obstructive pulmonary disease)     Emphysema  . Depression   . Posttraumatic stress disorder   . GERD (gastroesophageal reflux disease)   . Irritable bowel   . BPH (benign prostatic hyperplasia)   . Urinary tract bacterial infections   . Osteoporosis   . Hypertension     EF 50-55%, echo, January, 2012  . Dyslipidemia   . CAD (coronary artery disease)     Stents placed in [place] and [place] in the past  /Myoview 2008,  no ischemia, /   . Atrial flutter     History of the past with syncope and  ?? H. and consideration, no records  . 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 Hip pain   . Shoulder pain   . Gout   . Peripheral arterial disease     Dopplers Dr.Vyas office October, 2010, apparent occlusion anterior tibial arteries bilaterally., findings suggest hemodynamically significant stenosis of the proximal right provisional femoral artery and the mid left superficial artery.  Ankle-brachial indices indicate moderate stenosis within both lower extremities  . Chronic kidney disease     Creatinine 2.1, October 13, 2010    Past Surgical History  Procedure Date  . Knee arthroplasty     Right  . Total knee arthroplasty     Left  . Total hip arthroplasty     Right-complicated hx  . Colon resection   . Back surgery     [place]  . Prostate surgery     ROS  Patient denies fever, chills, headache, sweats, rash, change in vision, change in hearing, chest pain, cough, nausea vomiting, urinary symptoms.  All other systems are reviewed and are negative. PHYSICAL EXAM Patient is stable.  He is here with his wife.  He is oriented to person time and place.  Affect is normal.  Head is atraumatic.  There is no xanthelasma.  No carotid bruits.  There is no jugular venous distention.  Lungs are clear.  Respiratory effort is nonlabored.  Cardiac exam reveals S1 and S2.  No clicks or significant murmurs.  The abdomen is soft.  There are no musculoskeletal deformities.  There is no peripheral edema.  No skin rashes. Filed Vitals:   11/17/10 1417  BP: 112/66  Pulse: 85  Height: 6\' 2"  (1.88 m)  Weight: 269 lb (122.018 kg)    EKG No EKG is done today.  As outlined I reviewed his CardioNet monitor  extensively.  I discussed all the specifics with the patient and his wife.  ASSESSMENT & PLAN

## 2010-11-17 NOTE — Assessment & Plan Note (Signed)
Coronary disease is stable.  Because he has coronary disease I cannot use some of the similar antiarrhythmics.  With this current atrial fib burn and lack of symptoms, I chosen not to put him on sotalol, amiodarone or tickosyn.

## 2010-11-17 NOTE — Assessment & Plan Note (Signed)
Blood pressure is under good control.  We'll be increasing the Cardizem for rate control.

## 2010-11-21 LAB — BASIC METABOLIC PANEL
CO2: 25 mEq/L (ref 19–32)
CO2: 27 mEq/L (ref 19–32)
Calcium: 7.9 mg/dL — ABNORMAL LOW (ref 8.4–10.5)
Calcium: 8.3 mg/dL — ABNORMAL LOW (ref 8.4–10.5)
Chloride: 107 mEq/L (ref 96–112)
Creatinine, Ser: 1.01 mg/dL (ref 0.4–1.5)
GFR calc Af Amer: >60 mL/min (ref >60)
GFR calc Af Amer: >60 mL/min (ref >60)
GFR calc non Af Amer: >60 mL/min (ref >60)
Glucose, Bld: 93 mg/dL (ref 70–99)
Glucose, Bld: 95 mg/dL (ref 70–99)
Potassium: 3.6 mEq/L (ref 3.5–5.1)
Sodium: 135 mEq/L (ref 135–145)
Sodium: 138 mEq/L (ref 135–145)
Sodium: 138 mEq/L (ref 135–145)

## 2010-11-21 LAB — CBC
HCT: 30 % — ABNORMAL LOW (ref 39.0–52.0)
Hemoglobin: 7.6 g/dL — CL (ref 13.0–17.0)
Hemoglobin: 9.4 g/dL — ABNORMAL LOW (ref 13.0–17.0)
Hemoglobin: 9.9 g/dL — ABNORMAL LOW (ref 13.0–17.0)
MCHC: 33 g/dL (ref 30.0–36.0)
MCHC: 33 g/dL (ref 30.0–36.0)
MCV: 86.4 fL (ref 78.0–100.0)
Platelets: 134 10*3/uL — ABNORMAL LOW (ref 150–400)
RBC: 3.26 MIL/uL — ABNORMAL LOW (ref 4.22–5.81)
RBC: 3.48 MIL/uL — ABNORMAL LOW (ref 4.22–5.81)
RDW: 17.7 % — ABNORMAL HIGH (ref 11.5–15.5)
WBC: 7.2 10*3/uL (ref 4.0–10.5)

## 2010-11-21 LAB — URINE CULTURE: Colony Count: >=100000

## 2010-11-22 LAB — URINE MICROSCOPIC-ADD ON

## 2010-11-22 LAB — BASIC METABOLIC PANEL
BUN: 22 mg/dL (ref 6–23)
CO2: 28 mEq/L (ref 19–32)
Calcium: 9.1 mg/dL (ref 8.4–10.5)
GFR calc non Af Amer: 50 mL/min — ABNORMAL LOW (ref >60)
Glucose, Bld: 113 mg/dL — ABNORMAL HIGH (ref 70–99)
Potassium: 4.2 mEq/L (ref 3.5–5.1)
Sodium: 137 mEq/L (ref 135–145)

## 2010-11-22 LAB — URINALYSIS, ROUTINE W REFLEX MICROSCOPIC
Bilirubin Urine: NEGATIVE
Glucose, UA: NEGATIVE mg/dL
Specific Gravity, Urine: 1.012 (ref 1.005–1.030)
Urobilinogen, UA: 0.2 mg/dL (ref 0.0–1.0)
pH: 5.5 (ref 5.0–8.0)

## 2010-11-22 LAB — DIFFERENTIAL
Basophils Absolute: 0 10*3/uL (ref 0.0–0.1)
Basophils Relative: 0 % (ref 0–1)
Eosinophils Relative: 3 % (ref 0–5)
Lymphocytes Relative: 12 % (ref 12–46)
Monocytes Absolute: 0.4 10*3/uL (ref 0.1–1.0)

## 2010-11-22 LAB — TYPE AND SCREEN

## 2010-11-22 LAB — CBC
HCT: 32 % — ABNORMAL LOW (ref 39.0–52.0)
Hemoglobin: 10.4 g/dL — ABNORMAL LOW (ref 13.0–17.0)
MCHC: 32.5 g/dL (ref 30.0–36.0)
Platelets: 171 10*3/uL (ref 150–400)
RDW: 18.3 % — ABNORMAL HIGH (ref 11.5–15.5)

## 2010-11-22 LAB — PROTIME-INR: Prothrombin Time: 14.5 seconds (ref 11.6–15.2)

## 2010-11-23 ENCOUNTER — Encounter (HOSPITAL_COMMUNITY): Admission: RE | Admit: 2010-11-23 | Source: Ambulatory Visit

## 2010-11-27 ENCOUNTER — Other Ambulatory Visit (HOSPITAL_COMMUNITY): Payer: Medicare Other

## 2010-11-30 ENCOUNTER — Encounter (HOSPITAL_COMMUNITY): Payer: Self-pay

## 2010-11-30 ENCOUNTER — Encounter (HOSPITAL_COMMUNITY)
Admission: RE | Admit: 2010-11-30 | Discharge: 2010-11-30 | Disposition: A | Discharge status: Home / Self Care | Payer: Medicare Other | Source: Ambulatory Visit | Attending: Internal Medicine | Admitting: Internal Medicine

## 2010-11-30 DIAGNOSIS — R109 Unspecified abdominal pain: Secondary | ICD-10-CM | POA: Insufficient documentation

## 2010-11-30 DIAGNOSIS — K219 Gastro-esophageal reflux disease without esophagitis: Secondary | ICD-10-CM | POA: Insufficient documentation

## 2010-11-30 DIAGNOSIS — R112 Nausea with vomiting, unspecified: Secondary | ICD-10-CM | POA: Insufficient documentation

## 2010-11-30 MED ORDER — TECHNETIUM TC 99M SULFUR COLLOID
2.0000 | Freq: Once | INTRAVENOUS | Status: AC | PRN
  Administered 2010-11-30: 1.88 via INTRAVENOUS

## 2010-12-01 NOTE — Consult Note (Addendum)
NAME:  OLIS, VIVERETTE               ACCOUNT NO.:  [id number]  MEDICAL RECORD NO.:  [id number]           PATIENT TYPE:  LOCATION:                                 FACILITY:  PHYSICIAN:  Lionel December, M.D.    DATE OF BIRTH:  1941-04-11  DATE OF CONSULTATION: DATE OF DISCHARGE:                                CONSULTATION   REASON FOR CONSULT:  GERD.  HISTORY OF PRESENT ILLNESS:  Mr. Berkovich is a 70 year old black male referred to our office by Dr. Sherril Croon for GERD.  Mr. Minnehan states at times he will have a sour taste in his mouth.  He also states that occasionally he will have to vomit his food back up.  He states that foods feel like they are sitting in his stomach.  He says that he has had these symptoms for "a long time."  He denies any dysphagia.  He does occasionally have acid reflux if he eats tomatoes or pizzas.  He says his appetite has been good.  There has been no weight loss.  He usually has a bowel movement daily or every other day.  His last colonoscopy was by Dr. Linna Darner a year ago which showed he had diverticulosis.  There has been no weight loss.  No appetite changes.  No fever.  He does what as I said occasionally have acid reflux.  ALLERGIES:  PENICILLIN.  SURGERIES:  He has had a left and right hip replacement.  He has had left and right knee replacement.  He has had back surgery L3 and L4.  He has history of diverticulitis and he has had a colon resection for this. He has a history of hypertension, gout, high cholesterol, COPD and he does have vascular problems.  He does walk with a walker.  MEDICAL HISTORY:  His mother is deceased from childbirth.  Father is deceased of unknown cause.  No brothers or sisters.  He is married.  He is retired from the [employer].  He smoked for 30 years, but quit 5 years ago.  No EtOH and he has one child in good health.  HOME MEDICATIONS: 1. Allopurinol 100 mg a day. 2. Lasix 20 mg a day. 3. Fenofibrate 145 mg a day. 4. Protonix  40 mg a day. 5. Vitamin C 500 mg 2 a day. 6. Finasteride 5 mg a day. 7. Pletal 10 mg a day. 8. Klor-Con 20 mEq a day. 9. Fish oil 2 three times a day. 10.Bactrim DS twice a day. 11.Colace 100 mg twice a day. 12.Robaxin 500 mg one q.6 h. p.r.n. 13.Aspirin before meals 325 one a day. 14.Hydrocodone 5/500 twice a day as needed. 15.Vitamin D3 1000 units a day. 16.Metoprolol 100 mg one a day. 17.Maalox or Mylanta p.r.n. 18.Colchicine p.r.n.  OBJECTIVE:  VITAL SIGNS:  His weight is 271, his height is 6 feet 2 inches, his temperature is 98.6, his blood pressure is 142/78 and his pulse is 80. GENERAL:  Mr. Aristeo was examined from the chair.  He would have too much difficulty getting on the stretcher. EYES:  His conjunctivae are pink.  His sclerae are  anicteric. NECK:  His thyroid is normal.  There is no cervical lymphadenopathy. LUNGS:  Clear. HEART:  Regular rate and rhythm. ABDOMEN:  Obese.  Bowel sounds are positive.  No masses. EXTREMITIES:  He does have edema to his left ankle.  His right ankle does not have edema.  LABORATORY DATA:  Glucose 108.  This was on October 13, 2010, glucose 108, BUN 37, creatinine 2.01, calcium 9.4, total protein 6.5, albumin 3.2.  Sodium 134, ALP 35, AST 38, total bilirubin 0.5, potassium is 4.8, chloride 106, hemoglobin is 9.9, hematocrit is 31.0, MCV is 93.8.  ASSESSMENT:  Daevon is a 70 year old black male who will occasionally have a sour taste in his mouth and at times his foods feels like it is sitting in his stomach.  He may have a slow emptying stomach or gastroparesis.  He does state that his acid reflux for the most part controlled with Protonix.  RECOMMENDATIONS:  I would like to get a gastric emptying study on him and further recommendations once we have this study back.    ______________________________ Dorene Ar, NP   ______________________________ Lionel December, M.D.    TS/MEDQ  D:  11/17/2010  T:  11/18/2010  Job:   [id number]  Electronically Signed by Lionel December M.D. on 12/25/2010 12:17:19 PM

## 2010-12-09 ENCOUNTER — Encounter (INDEPENDENT_AMBULATORY_CARE_PROVIDER_SITE_OTHER): Payer: Medicare Other | Admitting: Internal Medicine

## 2010-12-09 ENCOUNTER — Ambulatory Visit (HOSPITAL_COMMUNITY)
Admission: RE | Admit: 2010-12-09 | Discharge: 2010-12-09 | Disposition: A | Discharge status: Home / Self Care | Payer: Medicare Other | Source: Ambulatory Visit | Attending: Internal Medicine | Admitting: Internal Medicine

## 2010-12-09 DIAGNOSIS — K449 Diaphragmatic hernia without obstruction or gangrene: Secondary | ICD-10-CM | POA: Insufficient documentation

## 2010-12-09 DIAGNOSIS — Z79899 Other long term (current) drug therapy: Secondary | ICD-10-CM | POA: Insufficient documentation

## 2010-12-09 DIAGNOSIS — K2100 Gastro-esophageal reflux disease with esophagitis, without bleeding: Secondary | ICD-10-CM | POA: Insufficient documentation

## 2010-12-09 DIAGNOSIS — Z7982 Long term (current) use of aspirin: Secondary | ICD-10-CM | POA: Insufficient documentation

## 2010-12-09 DIAGNOSIS — E785 Hyperlipidemia, unspecified: Secondary | ICD-10-CM | POA: Insufficient documentation

## 2010-12-09 DIAGNOSIS — R1110 Vomiting, unspecified: Secondary | ICD-10-CM | POA: Insufficient documentation

## 2010-12-09 DIAGNOSIS — K259 Gastric ulcer, unspecified as acute or chronic, without hemorrhage or perforation: Secondary | ICD-10-CM | POA: Insufficient documentation

## 2010-12-09 DIAGNOSIS — K219 Gastro-esophageal reflux disease without esophagitis: Secondary | ICD-10-CM

## 2010-12-09 DIAGNOSIS — R933 Abnormal findings on diagnostic imaging of other parts of digestive tract: Secondary | ICD-10-CM

## 2010-12-09 DIAGNOSIS — J4489 Other specified chronic obstructive pulmonary disease: Secondary | ICD-10-CM | POA: Insufficient documentation

## 2010-12-09 DIAGNOSIS — J449 Chronic obstructive pulmonary disease, unspecified: Secondary | ICD-10-CM | POA: Insufficient documentation

## 2010-12-09 DIAGNOSIS — I10 Essential (primary) hypertension: Secondary | ICD-10-CM | POA: Insufficient documentation

## 2010-12-09 DIAGNOSIS — R1013 Epigastric pain: Secondary | ICD-10-CM | POA: Insufficient documentation

## 2010-12-18 ENCOUNTER — Observation Stay (HOSPITAL_COMMUNITY)
Admission: EM | Admit: 2010-12-18 | Discharge: 2010-12-19 | Disposition: A | Discharge status: Home / Self Care | Payer: Medicare Other | Source: Home / Self Care | Attending: Emergency Medicine | Admitting: Emergency Medicine

## 2010-12-18 ENCOUNTER — Emergency Department (HOSPITAL_COMMUNITY): Payer: Medicare Other

## 2010-12-18 DIAGNOSIS — J4489 Other specified chronic obstructive pulmonary disease: Secondary | ICD-10-CM | POA: Insufficient documentation

## 2010-12-18 DIAGNOSIS — IMO0001 Reserved for inherently not codable concepts without codable children: Secondary | ICD-10-CM

## 2010-12-18 DIAGNOSIS — Z96659 Presence of unspecified artificial knee joint: Secondary | ICD-10-CM | POA: Insufficient documentation

## 2010-12-18 DIAGNOSIS — M109 Gout, unspecified: Secondary | ICD-10-CM | POA: Insufficient documentation

## 2010-12-18 DIAGNOSIS — Z01812 Encounter for preprocedural laboratory examination: Secondary | ICD-10-CM | POA: Insufficient documentation

## 2010-12-18 DIAGNOSIS — I2510 Atherosclerotic heart disease of native coronary artery without angina pectoris: Secondary | ICD-10-CM | POA: Insufficient documentation

## 2010-12-18 DIAGNOSIS — X500XXA Overexertion from strenuous movement or load, initial encounter: Secondary | ICD-10-CM | POA: Insufficient documentation

## 2010-12-18 DIAGNOSIS — Y92009 Unspecified place in unspecified non-institutional (private) residence as the place of occurrence of the external cause: Secondary | ICD-10-CM | POA: Insufficient documentation

## 2010-12-18 DIAGNOSIS — Z79899 Other long term (current) drug therapy: Secondary | ICD-10-CM | POA: Insufficient documentation

## 2010-12-18 DIAGNOSIS — J449 Chronic obstructive pulmonary disease, unspecified: Secondary | ICD-10-CM | POA: Insufficient documentation

## 2010-12-18 DIAGNOSIS — T84029A Dislocation of unspecified internal joint prosthesis, initial encounter: Secondary | ICD-10-CM | POA: Insufficient documentation

## 2010-12-18 DIAGNOSIS — Z981 Arthrodesis status: Secondary | ICD-10-CM | POA: Insufficient documentation

## 2010-12-18 DIAGNOSIS — Z96649 Presence of unspecified artificial hip joint: Secondary | ICD-10-CM | POA: Insufficient documentation

## 2010-12-18 DIAGNOSIS — I10 Essential (primary) hypertension: Secondary | ICD-10-CM | POA: Insufficient documentation

## 2010-12-18 LAB — CBC
HCT: 33.5 % — ABNORMAL LOW (ref 39.0–52.0)
MCHC: 32.8 g/dL (ref 30.0–36.0)
MCV: 90.3 fL (ref 78.0–100.0)
Platelets: 158 10*3/uL (ref 150–400)
RDW: 15.9 % — ABNORMAL HIGH (ref 11.5–15.5)

## 2010-12-18 LAB — BASIC METABOLIC PANEL
BUN: 17 mg/dL (ref 6–23)
Calcium: 9.9 mg/dL (ref 8.4–10.5)
GFR calc non Af Amer: 59 mL/min — ABNORMAL LOW (ref >60)
Glucose, Bld: 75 mg/dL (ref 70–99)
Sodium: 134 mEq/L — ABNORMAL LOW (ref 135–145)

## 2010-12-18 LAB — DIFFERENTIAL
Basophils Absolute: 0 10*3/uL (ref 0.0–0.1)
Eosinophils Absolute: 0.3 10*3/uL (ref 0.0–0.7)
Eosinophils Relative: 5 % (ref 0–5)
Lymphocytes Relative: 26 % (ref 12–46)
Monocytes Absolute: 0.8 10*3/uL (ref 0.1–1.0)

## 2010-12-20 ENCOUNTER — Emergency Department (HOSPITAL_COMMUNITY): Payer: Medicare Other

## 2010-12-20 ENCOUNTER — Inpatient Hospital Stay (HOSPITAL_COMMUNITY)
Admission: EM | Admit: 2010-12-20 | Discharge: 2010-12-26 | DRG: 467 | Disposition: A | Discharge status: Home Health | Payer: Medicare Other | Attending: Orthopedic Surgery | Admitting: Orthopedic Surgery

## 2010-12-20 DIAGNOSIS — E871 Hypo-osmolality and hyponatremia: Secondary | ICD-10-CM | POA: Diagnosis not present

## 2010-12-20 DIAGNOSIS — K5730 Diverticulosis of large intestine without perforation or abscess without bleeding: Secondary | ICD-10-CM | POA: Diagnosis present

## 2010-12-20 DIAGNOSIS — I739 Peripheral vascular disease, unspecified: Secondary | ICD-10-CM | POA: Diagnosis present

## 2010-12-20 DIAGNOSIS — Z79899 Other long term (current) drug therapy: Secondary | ICD-10-CM

## 2010-12-20 DIAGNOSIS — Z9861 Coronary angioplasty status: Secondary | ICD-10-CM

## 2010-12-20 DIAGNOSIS — I10 Essential (primary) hypertension: Secondary | ICD-10-CM | POA: Diagnosis present

## 2010-12-20 DIAGNOSIS — X58XXXA Exposure to other specified factors, initial encounter: Secondary | ICD-10-CM | POA: Diagnosis present

## 2010-12-20 DIAGNOSIS — Y939 Activity, unspecified: Secondary | ICD-10-CM

## 2010-12-20 DIAGNOSIS — E669 Obesity, unspecified: Secondary | ICD-10-CM | POA: Diagnosis present

## 2010-12-20 DIAGNOSIS — Y92009 Unspecified place in unspecified non-institutional (private) residence as the place of occurrence of the external cause: Secondary | ICD-10-CM

## 2010-12-20 DIAGNOSIS — Z7982 Long term (current) use of aspirin: Secondary | ICD-10-CM

## 2010-12-20 DIAGNOSIS — D62 Acute posthemorrhagic anemia: Secondary | ICD-10-CM | POA: Diagnosis not present

## 2010-12-20 DIAGNOSIS — Z96659 Presence of unspecified artificial knee joint: Secondary | ICD-10-CM

## 2010-12-20 DIAGNOSIS — M109 Gout, unspecified: Secondary | ICD-10-CM | POA: Diagnosis present

## 2010-12-20 DIAGNOSIS — K219 Gastro-esophageal reflux disease without esophagitis: Secondary | ICD-10-CM | POA: Diagnosis present

## 2010-12-20 DIAGNOSIS — N400 Enlarged prostate without lower urinary tract symptoms: Secondary | ICD-10-CM | POA: Diagnosis present

## 2010-12-20 DIAGNOSIS — J449 Chronic obstructive pulmonary disease, unspecified: Secondary | ICD-10-CM | POA: Diagnosis present

## 2010-12-20 DIAGNOSIS — I4891 Unspecified atrial fibrillation: Secondary | ICD-10-CM | POA: Diagnosis not present

## 2010-12-20 DIAGNOSIS — T84029A Dislocation of unspecified internal joint prosthesis, initial encounter: Principal | ICD-10-CM | POA: Diagnosis present

## 2010-12-20 DIAGNOSIS — I2510 Atherosclerotic heart disease of native coronary artery without angina pectoris: Secondary | ICD-10-CM | POA: Diagnosis present

## 2010-12-20 DIAGNOSIS — Z981 Arthrodesis status: Secondary | ICD-10-CM

## 2010-12-20 DIAGNOSIS — K449 Diaphragmatic hernia without obstruction or gangrene: Secondary | ICD-10-CM | POA: Diagnosis present

## 2010-12-20 DIAGNOSIS — Z96649 Presence of unspecified artificial hip joint: Secondary | ICD-10-CM

## 2010-12-20 DIAGNOSIS — Y998 Other external cause status: Secondary | ICD-10-CM

## 2010-12-20 DIAGNOSIS — Z9049 Acquired absence of other specified parts of digestive tract: Secondary | ICD-10-CM

## 2010-12-20 DIAGNOSIS — Z880 Allergy status to penicillin: Secondary | ICD-10-CM

## 2010-12-20 DIAGNOSIS — J4489 Other specified chronic obstructive pulmonary disease: Secondary | ICD-10-CM | POA: Diagnosis present

## 2010-12-20 DIAGNOSIS — I959 Hypotension, unspecified: Secondary | ICD-10-CM | POA: Diagnosis not present

## 2010-12-20 DIAGNOSIS — E785 Hyperlipidemia, unspecified: Secondary | ICD-10-CM | POA: Diagnosis present

## 2010-12-20 LAB — CBC
HCT: 31.8 % — ABNORMAL LOW (ref 39.0–52.0)
Hemoglobin: 10.4 g/dL — ABNORMAL LOW (ref 13.0–17.0)
MCH: 29.7 pg (ref 26.0–34.0)
MCV: 90.9 fL (ref 78.0–100.0)
RBC: 3.5 MIL/uL — ABNORMAL LOW (ref 4.22–5.81)

## 2010-12-20 LAB — BASIC METABOLIC PANEL
CO2: 21 mEq/L (ref 19–32)
Chloride: 105 mEq/L (ref 96–112)
Creatinine, Ser: 0.99 mg/dL (ref 0.4–1.5)
GFR calc Af Amer: >60 mL/min (ref >60)
Glucose, Bld: 99 mg/dL (ref 70–99)

## 2010-12-20 LAB — PROTIME-INR: INR: 1.13 (ref 0.00–1.49)

## 2010-12-21 ENCOUNTER — Encounter: Payer: Self-pay | Admitting: Cardiology

## 2010-12-21 ENCOUNTER — Inpatient Hospital Stay (HOSPITAL_COMMUNITY): Payer: Medicare Other

## 2010-12-21 LAB — GRAM STAIN

## 2010-12-21 LAB — HEMOGLOBIN AND HEMATOCRIT, BLOOD: HCT: 32 % — ABNORMAL LOW (ref 39.0–52.0)

## 2010-12-22 LAB — BASIC METABOLIC PANEL
BUN: 13 mg/dL (ref 6–23)
Calcium: 8.3 mg/dL — ABNORMAL LOW (ref 8.4–10.5)
Calcium: 8 mg/dL — ABNORMAL LOW (ref 8.4–10.5)
Chloride: 108 mEq/L (ref 96–112)
Creatinine, Ser: 1.2 mg/dL (ref 0.4–1.5)
GFR calc Af Amer: >60 mL/min (ref >60)
GFR calc non Af Amer: >60 mL/min (ref >60)
GFR calc non Af Amer: >60 mL/min (ref >60)
Potassium: 4.6 mEq/L (ref 3.5–5.1)

## 2010-12-22 LAB — MAGNESIUM: Magnesium: 1.2 mg/dL — ABNORMAL LOW (ref 1.5–2.5)

## 2010-12-22 LAB — CBC
MCV: 91 fL (ref 78.0–100.0)
Platelets: 130 10*3/uL — ABNORMAL LOW (ref 150–400)
RDW: 15.4 % (ref 11.5–15.5)
WBC: 8.8 10*3/uL (ref 4.0–10.5)

## 2010-12-22 LAB — PHOSPHORUS: Phosphorus: 2.4 mg/dL (ref 2.3–4.6)

## 2010-12-22 LAB — GLUCOSE, CAPILLARY: Glucose-Capillary: 145 mg/dL — ABNORMAL HIGH (ref 70–99)

## 2010-12-23 DIAGNOSIS — R578 Other shock: Secondary | ICD-10-CM

## 2010-12-23 DIAGNOSIS — I4891 Unspecified atrial fibrillation: Secondary | ICD-10-CM

## 2010-12-23 LAB — CBC
Hemoglobin: 7.4 g/dL — ABNORMAL LOW (ref 13.0–17.0)
MCHC: 33.3 g/dL (ref 30.0–36.0)
Platelets: 104 10*3/uL — ABNORMAL LOW (ref 150–400)
RDW: 15.5 % (ref 11.5–15.5)

## 2010-12-23 LAB — BASIC METABOLIC PANEL
BUN: 13 mg/dL (ref 6–23)
GFR calc Af Amer: >60 mL/min (ref >60)
Sodium: 132 mEq/L — ABNORMAL LOW (ref 135–145)

## 2010-12-24 ENCOUNTER — Inpatient Hospital Stay (HOSPITAL_COMMUNITY): Payer: Medicare Other

## 2010-12-24 ENCOUNTER — Telehealth: Payer: Self-pay | Admitting: *Deleted

## 2010-12-24 LAB — CBC
HCT: 25.9 % — ABNORMAL LOW (ref 39.0–52.0)
Hemoglobin: 8.7 g/dL — ABNORMAL LOW (ref 13.0–17.0)
MCH: 29.9 pg (ref 26.0–34.0)
MCHC: 33.6 g/dL (ref 30.0–36.0)
MCV: 89 fL (ref 78.0–100.0)
RDW: 15.2 % (ref 11.5–15.5)

## 2010-12-24 LAB — CROSSMATCH
ABO/RH(D): O NEG
Antibody Screen: NEGATIVE
Unit division: 0
Unit division: 0

## 2010-12-24 LAB — BASIC METABOLIC PANEL
BUN: 12 mg/dL (ref 6–23)
CO2: 22 mEq/L (ref 19–32)
Calcium: 8.8 mg/dL (ref 8.4–10.5)
GFR calc non Af Amer: >60 mL/min (ref >60)
Glucose, Bld: 105 mg/dL — ABNORMAL HIGH (ref 70–99)

## 2010-12-24 NOTE — Telephone Encounter (Signed)
Message copied by Hoover Brunette on Thu Dec 24, 2010  1:58 PM ------      Message from: Lewayne Bunting      Created: Tue Dec 22, 2010 11:09 AM      Regarding: Cardionet Monitor       CardioNet monitor report: Several episodes of atrial fibrillation with rapid response, all were asymptomatic.  One episode of nonsustained ventricular tachycardia heart rate hundred and 14 beats/min, asymptomatic.  Otherwise normal sinus rhythm and sinus tachycardia      Apparently accordingly to Dr. Myrtis Ser is note this was already discussed with the patient during the clinic visit.

## 2010-12-24 NOTE — Telephone Encounter (Signed)
Patient already seen in office by Dr. Myrtis Ser on 4/3.

## 2010-12-25 ENCOUNTER — Inpatient Hospital Stay (HOSPITAL_COMMUNITY): Payer: Medicare Other

## 2010-12-25 LAB — BASIC METABOLIC PANEL
BUN: 9 mg/dL (ref 6–23)
CO2: 23 mEq/L (ref 19–32)
Calcium: 9.8 mg/dL (ref 8.4–10.5)
Chloride: 107 mEq/L (ref 96–112)
Creatinine, Ser: 0.82 mg/dL (ref 0.4–1.5)
GFR calc Af Amer: >60 mL/min (ref >60)

## 2010-12-25 LAB — CBC
Hemoglobin: 8.4 g/dL — ABNORMAL LOW (ref 13.0–17.0)
MCH: 30.3 pg (ref 26.0–34.0)
MCHC: 34 g/dL (ref 30.0–36.0)
MCV: 89.2 fL (ref 78.0–100.0)
Platelets: 107 10*3/uL — ABNORMAL LOW (ref 150–400)

## 2010-12-25 NOTE — Op Note (Signed)
  NAME:  Bryce Berry, Bryce Berry               ACCOUNT NO.:  [id number]  MEDICAL RECORD NO.:  [id number]           PATIENT TYPE:  O  LOCATION:  DAYP                          FACILITY:  [facility]  PHYSICIAN:  Lionel December, M.D.    DATE OF BIRTH:  [birth year]  DATE OF PROCEDURE:  12/09/2010 DATE OF DISCHARGE:                              OPERATIVE REPORT   PROCEDURE:  Esophagogastroduodenoscopy.  INDICATION:  Bryce Berry is a 70 year old African American male with multiple medical problems who is in several medications including PPI who presents with recurrent regurgitation and sour taste in his mouth and intermittent vomiting.  He had abnormal gastric emptying study 95% of the trace was there in his stomach at 2 hours.  He is undergoing a diagnostic EGD to make sure he does not have peptic ulcer disease or pyloric stenosis and not true gastropareses.  Procedure risks were reviewed with the patient and informed consent was obtained.  MEDICATIONS FOR CONSCIOUS SEDATION: 1. Cetacaine spray for oropharyngeal topical anesthesia. 2. Demerol 50 mg IV. 3. Versed 5 mg IV.  FINDINGS:  Procedure performed in endoscopy suite.  The patient's vital signs and O2 sats were monitored during the procedure and remained stable.  The patient was placed in left lateral recumbent position and Pentax videoscope was passed through oropharynx without any difficulty into esophagus.  Esophagus; mucosa of the esophagus normal.  There was single small erosion at GE junction which was located at 35-cm.  A large hernia with dependent segment on the left side.  There were multiple erosions noted involving the herniated part of the stomach and there was single ulcer about 3-4 mm at the level of hiatus.  Stomach; part of the stomach that was below the diaphragm was normal. No mucosal abnormalities noted.  Mucosa at body, antrum, pyloric channel as well as angularis.  Hernia was easily seen on retroflexed view.  Duodenum; bulbar  mucosa was normal.  Scope was passed into second part of the duodenal mucosa and folds were normal.  Endoscope was withdrawn. The patient tolerated the procedure well.  FINAL DIAGNOSES: 1. Erosive reflux esophagitis. 2. A large sliding hiatal hernia with multiple erosions and single ulcer     at the level of hiatus. 3. I suspect abnormal GES may be due to the fact that bolus has stayed     in the herniated part of the stomach.  RECOMMENDATIONS: 1. He will continue antireflux measures and a PPI as before. 2. A short trial with metoclopramide 10 mg before each meal     prescription given for 2 weeks without any refills.  Should he     experience any side effects, he will let me know.  He may     eventually need to take a domperidone. 3. The patient will call us with progress report in 2 weeks.          ______________________________ Lionel December, M.D.     NR/MEDQ  D:  12/09/2010  T:  12/10/2010  Job:  [id number]  Electronically Signed by Lionel December M.D. on 12/25/2010 12:16:43 PM

## 2010-12-26 LAB — ANAEROBIC CULTURE

## 2010-12-26 LAB — CBC
MCH: 30.6 pg (ref 26.0–34.0)
MCV: 90.7 fL (ref 78.0–100.0)
Platelets: 117 10*3/uL — ABNORMAL LOW (ref 150–400)
RBC: 2.68 MIL/uL — ABNORMAL LOW (ref 4.22–5.81)
RDW: 15.7 % — ABNORMAL HIGH (ref 11.5–15.5)

## 2010-12-28 NOTE — Op Note (Signed)
NAME:  Bryce Berry, Bryce Berry               ACCOUNT NO.:  [id number]  MEDICAL RECORD NO.:  [id number]           PATIENT TYPE:  I  LOCATION:  1616                         FACILITY:  [facility]  PHYSICIAN:  Madlyn Frankel. Charlann Boxer, M.D.  DATE OF BIRTH:  [birth year]  DATE OF PROCEDURE:  12/21/2010 DATE OF DISCHARGE:                              OPERATIVE REPORT   PREOPERATIVE DIAGNOSIS:  Right hip instability with dislocation.  POSTOPERATIVE DIAGNOSIS:  Right hip instability with dislocation.  PROCEDURE:  Right total hip revision utilizing DePuy Deep Profile 64 mm cup with 2 cancellous screws, a 58 mm inner marathon liner, +4 10-degree lip for 36 ball, 36 +12 ball was placed.  SURGEON:  Madlyn Frankel. Charlann Boxer, MD  ASSISTANT:  Jaquelyn Bitter. Chabon, P.A.  ANESTHESIA:  General.  BLOOD LOSS:  2 L.  SPECIMEN:  I did send joint fluid by swab.  No bacteria seen and rare white blood cells seen.  DRAINS:  One Hemovac.  INDICATIONS FOR PROCEDURE:  Mr. Granja is a 70 year old gentleman, patient of mine with a longstanding history of a Girdlestone, right hip. He underwent a reimplantation attempt, it was complicated by early dislocation in the past followed by revision surgery and then dislocation and chronic wound drainage.  He had done well over the past 3 months or so until dislocating this past weekend.  Another attempt at reducing his hip on Sunday, Dec 20, 2010, his hip was unable to be reduced and maintained reduced.  He was thus placed in knee immobilizer, admitted to the hospital.  I had a lengthy discussion with him and his wife today.  He has slightly elevated CRP and sed rate values and attempted hip aspiration was negative for any fluid.  I reviewed with him that if I got into his hip and found there was concern for infection, that I would plan to resect this hip.  If I felt that his tissues were of normal condition, then I would plan on revising his hip.  Risks and benefits were discussed at length  with him and his wife.  Consent was obtained.  PROCEDURE IN DETAIL:  The patient was brought to operative theater. Once adequate anesthesia, preoperative antibiotics, 1 g of vancomycin administered, he was positioned into left lateral decubitus position with bony prominences padded.  The right hip was then prepped and draped in sterile fashion.  The patient's old incision was identified and excised.  Sharp dissection was carried down and soft tissue plane was created over the iliotibial band and gluteal fascia.  The patient noted to be oozing quite a bit due to his Pletal use and the scar tissue in this area.  An incision was made in the fascial plane for a posterior approach.  The patient was noted to be dislocated with the superior.  There was noted to be significant deformation of his acetabular liner once I got to that point.  Once I opened up the joint capsule, there was some fluid in the joint that I was able to swab and sent off for stat Gram stain.  Significant portion of the case at this point, probably greater  than an hour of the time, was spent at debridement of scar and soft tissue, initially in the posterior half to two-thirds of the hip.  I removed this, lot of heterotopic bone was present inferiorly as well as the anteriorly.  Following all this debridement, the assessment of the femoral stem noted that it was stable and had decent anteversion.  The previously placed cup was noted to be significantly forward flexed and vertical compared to what would normally be oriented.  Once I had the cup adequately exposed at this point, I removed the liner and then removed screws.  Two dome screws were identified and found to be very difficult to remove.  I feel perhaps his old cup had shifted forward, which could have led to the problems that we are dealing with.  There were 2 peripheral screws identified removed.  I then used the cup extractor set and I was able to remove  the acetabular shell without any significant bone loss.  This was a 60-mm cup.  I made a determination at this point to ream just to a 62 mm reamer and used a 64 Deep Profile cup.  Following preparation of acetabulum, debridement of any soft tissues present, the 64 cup was impacted.  Then using a curved impactor, it seem to be positioned about 35 degrees of abduction and forward flexed only 20 degrees.  This cup had a very good initial scratch fit. I placed 2 cancellous screws in to the ilium that had good purchase in the ilium.  At this point, I did a trial reduction with 36 +4 neutral liner with a +12 ball in place and there was no evidence of impingement with extension or external rotation.  The only issue I found is when his leg was adducted in the sleep position and then internally rotated about 40 degrees, he started to subluxate out.  Digitally palpating what was going on, I felt there was some residual anterior trochanter that was perhaps impinging on the ilium.  I had already removed a significant amount of scar anteriorly, freeing this up.  At this point, I spent time to try to remove this remaining bone.  Once this was done to my satisfaction, I removed the trial components again, placed the  +4 10-degree lip liner and the +12 ball again.  With this, I found no evidence of any subluxation in the hip and felt much more stable.  I felt I had more stability than before.  I thus chose to use these as my final components.  The hip was dislocated, the femoral head removed, the trial liner removed.  Following irrigation of the shell and drying it, the final 36 +4 10-degree liner to match with 64 Deep Profile cup was then impacted as I had oriented it for the trial with the lip positioned at about 9 to 9:30.  The final 36 +12 aSphere ball was the impacted on to clean and dry trunnion.  The hip was reduced.  We irrigated the hip throughout the case, again at this point.  At this  point, I reapproximated posterior pseudocapsule, superior  pseudocapsule with #1 Vicryl.  I placed a #1 Hemovac.  The Hemovac drain was then placed deep.  I then reapproximated the iliotibial band and gluteal fascia using #1 Vicryl.  The remainder of the wound was closed with 2-0 Vicryl and staples on the skin.  The wound was then cleaned, dried, and dressed sterilely using Xeroform and Mepilex dressing.  He was then  brought to recovery room extubated, tolerating the procedure thus fine so far.     Madlyn Frankel Charlann Boxer, M.D.     MDO/MEDQ  D:  12/21/2010  T:  12/21/2010  Job:  [id number]  Electronically Signed by Durene Romans M.D. on 12/28/2010 03:44:07 PM

## 2010-12-28 NOTE — Discharge Summary (Signed)
NAME:  Bryce Berry, Bryce Berry               ACCOUNT NO.:  [id number]  MEDICAL RECORD NO.:  [id number]           PATIENT TYPE:  LOCATION:                                 FACILITY:  PHYSICIAN:  Madlyn Frankel. Charlann Boxer, M.D.  DATE OF BIRTH:  [birth year]  DATE OF ADMISSION:  12/18/2010 DATE OF DISCHARGE:  12/26/2010                              DISCHARGE SUMMARY   ADMITTING DIAGNOSIS:  Dislocation, right total hip arthroplasty, question infection.  DISCHARGE DIAGNOSES: 1. Revision, right total hip arthroplasty. 2. Episode of atrial fibrillation and hypotension.  BRIEF HISTORY:  This is a 70 year old gentleman with a history of total hip arthroplasty, had subsequent dislocations and questionable infection.  He was admitted to the hospital by Dr. Simonne Come had a closed reduction of his dislocated total hip, subsequent re-dislocation and relocation with Dr. Lestine Box, subsequent recurrent dislocation and admission for open reduction and revision of his hip.  LABORATORY VALUES:  Admission CBC on the 4th showed a hemoglobin of 11.0, hematocrit 33.5.  Admission BMET showed sodium 134 and GFR normal. Admission PT/PTT within normal limits.  MRSA screen was negative.  ESR 36.  C-reactive protein 5.4.  Gram stain at operative time revealed WBCs, PMNs and mononuclears with no organisms seen.  Hemoglobin and hematocrit dropped to 7.4 and 22.2.  On the 9th, he was transfused 2 units of packed cells and on the 11th was 8.4, 24.7.  BMET showed some mild hyponatremia of 130-134 range, mildly elevated glucoses and BMET at discharge was within normal limits.  Wound cultures showed no growth. The procalcitonin was 0.10.  B-natriuretic peptide was 284.  Venous lactic acid in the unit on the 9th was 2.8.  Magnesium on the 8th was 1.2.  COURSE IN THE HOSPITAL:  The patient tolerated the operative procedure well.  Postoperatively in the recovery room, he had problems with hypotension, was placed on a neo drip and  subsequently admitted to the critical care unit for management of his hypotension.  On his second postoperative day in the evening, he had a further episode of hypotension necessitating continued neo drip and an episode of AFib which was treated by Critical Care and he converted back to normal sinus rhythm.  Third postoperative day, his vital signs were stable.  He was afebrile and his wound was dry.  4th postoperative day, vital signs were stable.  He was afebrile.  He had a mild tachycardia.  Hemoglobin 7.4, hematocrit 22.2, sodium 132.  Culture showed no growth and no organisms.  His drain was removed.  Neurovascular status in the leg intact.  No drainage from the surgical site and again his drain was removed.  Lungs were clear.  Heart sounds regular at this time after AFib was treated by the Critical Care Team.  With his anemia, he was transfused 2 units of packed cells.  Cardiology also saw the patient and maintained him on his medications for his AFib.  Medicine consult was also obtained for treatment of his anemia, hyponatremia, and COPD.  On the third postoperative day, he was doing better.  The wound was dry. Neurovascular status intact.  Calves negative.  He was stable from Cardiology and Critical Care Medicine perspective and was subsequently transfused back to the floor, being discharged from the Cardiology and Medicine Service, to be called if needed.  On the floor on the 10th, his vital signs were stable.  He was afebrile.  His wound was benign. Calves were negative.  Lungs: Clear.  Heart sounds normal.  Bowel sounds sluggish.  Culture showing no growth.  He was resumed for physical therapy for probable discontinue on Saturday.  PICC line to be placed. On May 11, his vital signs were stable.  He is afebrile.  Hemoglobin 8.4, hematocrit 24.7.  BMET within normal limits.  Lungs: Clear.  Heart sound normal bowel sounds active.  Calves negative.  Wound dry and benign.  He will  get his PICC line today and he will go to physical therapy today and should be discharged in the morning after physical therapy at stable.  CONDITION ON DISCHARGE:  Improved.  DISCHARGE MEDICATIONS:  See home med rec list.  He will also be on vancomycin protocol for 4 weeks IV postop.  He will remain on his antiplatelets for his DVT prophylaxis postoperatively.  He will keep the wound clean and dry for 3 weeks.  He will return to the office in 10 days for recheck or sooner p.r.n. problems.     Jaquelyn Bitter. Chabon, P.A.   ______________________________ Madlyn Frankel Charlann Boxer, M.D.    SJC/MEDQ  D:  12/25/2010  T:  12/25/2010  Job:  [id number]  cc:   Luis Abed, MD, [facility] [address]  [address] [place] [address]  Electronically Signed by Jodene Nam P.A. on 12/28/2010 11:53:00 AM Electronically Signed by Durene Romans M.D. on 12/28/2010 03:44:51 PM

## 2010-12-29 NOTE — Discharge Summary (Signed)
Bryce Berry, Bryce Berry               ACCOUNT NO.:  [id number]   MEDICAL RECORD NO.:  [id number]          PATIENT TYPE:  INP   LOCATION:  1616                         FACILITY:  [facility]   PHYSICIAN:  Madlyn Frankel. Charlann Boxer, M.D.  DATE OF BIRTH:  03-07-1941   DATE OF ADMISSION:  03/18/2009  DATE OF DISCHARGE:  03/21/2009                               DISCHARGE SUMMARY   ADMITTING DIAGNOSIS:  Left hip advanced osteoarthritis.   DISCHARGE DIAGNOSES:  1. Left hip advanced osteoarthritis, status post left total hip      replacement on March 18, 2009.  2. Acute blood loss anemia perioperatively.  3. Chronic obstructive pulmonary disease/emphysema.  4. Hypertension.  5. Coronary artery disease.  6. Hypercholesterolemia.  7. Depression.  8. Post-traumatic stress disorder.  9. Reflux disease.  10.Irritable bowel syndrome.  11.Prostate disease.  12.Recurrent urinary tract infections.  13.Osteoporosis.   BRIEF HISTORY:  Bryce Berry is a 70 year old gentleman self-referred for  evaluation and management of his hip issues.  He had a complicated right  hip history, including resection arthroplasty due to infection.   His left hip had radiographic changes of advanced osteoarthritis.  After  initial workup in the office to rule out source of infection  __________make sure that our situation would be best optimized.  Once  this was cleared and we knew we were not dealing with that, we set him  up for left hip replacement to provide him with the best stable  construct at the time of discharge for potential mobility.  He has been  overall limited since his right hip surgeries over the past year.   He has had a history of bilateral knee replacements.   Further past medical history can be obtained through the admitting  history and physical done.   HOSPITAL COURSE:  The patient was admitted for same-day surgery on  March 18, 2009.  He underwent an uncomplicated left total hip  replacement.  There was  some extra bleeding due to his bone quality, but  no evidence of active bleeding or oozing.   Please see dictated operative note for full details of the procedure.  We did utilize DePuy components for the procedure.   Postoperatively, he was transferred from the recovery room to the  orthopedic ward where he remained for the entire hospital stay.  On  postop day #1, he was noted to have a drop in his hematocrit to 23.1,  and for that reason he received 2 units of packed red blood cells.  His  Foley remained in until postop day #2.  At the time of his hospital  admission and placement of a Foley, we were concerned about persistent  cloudy urine and rechecked the culture.  Preoperatively, he was placed  on ciprofloxacin.  The culture came back on postop day #2 that he was  resistant to ciprofloxacin but was sensitive to Rocephin, as well as to  Macrobid or nitrofurantoin.  We had changed that medication on postop  day #2, with an IV dose of Rocephin and changing him over to oral  Macrodantin b.i.d.   By  postop day #3, his labs remained stable and he was afebrile with  stable vital signs.  His hematocrit was 30.  He had normal electrolytes.  He has been placed on the Macrodantin.  I reviewed questions with his he  and his wife each morning.  We will plan for him to be discharged to a  nursing facility on postop day #3.  He had been seen and evaluated by  physical therapy.  His mobility is significantly limited due to his  right hip Girdlestone and soreness on his left hip at this point.  They  want to return back to the [place] area closer to their home.   DISCHARGE INSTRUCTIONS:  I would like for Bryce Berry to follow-up with  Dr. Durene Romans at [facility] office, number [phone]  in 2 weeks for wound evaluation and mobility purposes.  We will obtain  radiographs at 6-week follow-up.  If there are any orthopedic issues  that need to be addressed, then the office can be  contacted and have  those addressed.   DISCHARGE MEDICATIONS:  His discharge medications will be his home  medications, which includes:  1. Colchicine 0.6 mg p.o. q.8 q.a.m.  2. Bupropion 150 mg q.a.m.  3. Finasteride 5 5 mg q.a.m.  4. Furosemide 20 mg q.a.m.  5. Pantoprazole 40 mg q.a.m.  6. Amlodipine 5 mg b.i.d.  7. Metoprolol 100 mg b.i.d.  8. Indomethacin 25 mg, I am going to have that on hold please.  9. Terazosin 10 mg nightly.  10.Lisinopril 40 mg nightly.  11.__________145 mg nightly.  12.Allopurinol 100 mg nightly.  13.Ezetimibe 10 mg/simvastatin 80 mg nightly.  14.Catapres patch weekly.  15.Morphine sulfate 30 mg q.a.m. or b.i.d. as needed for pain.  16.Hydromorphone 4 mg tablets 1-2 tablets every 4 hours p.r.n. for      pain.   ADDITIONAL MEDICATIONS:  At this point will be;  1. Robaxin 500 mg p.o. q.6 h., p.r.n. for pain and muscle spasms.  2. Celebrex 200 mg daily for multiple joint aches and pains.  3. Macrodantin (nitrofurantoin) at 100 mg p.o. b.i.d. to start on      March 21, 2009, and continue through March 27, 2009, with a stop      date at that point.  4. Lovenox 40 mg subcu daily for 2 weeks with a stop date on April 01, 2009.   DISCHARGE WOUND INSTRUCTIONS:  The wound should remain dry for 2 weeks  until follow-up.   DISCHARGE PHYSICAL THERAPY ORDERS:  He is to be weightbearing as  tolerated on the left lower extremity.  He is to work on generalized  upper and lower extremity strengthening.  He does have a Girdlestone on  the right side which can be touchdown weightbearing and work on muscle  strengthening.  If there are any questions regarding this, it can be  addressed to our office.      Madlyn Frankel Charlann Boxer, M.D.  Electronically Signed     MDO/MEDQ  D:  03/21/2009  T:  03/21/2009  Job:  [id number]

## 2010-12-29 NOTE — Assessment & Plan Note (Signed)
[facility]                          [facility] OFFICE NOTE   Bryce, Berry                      MRN:          [id number]  DATE:11/06/2008                            DOB:          [birth year]    HISTORY OF PRESENT ILLNESS:  [name] is here for cardiac evaluation.  He has known coronary artery disease.  He was hospitalized most recently  with severe hip pain, tachycardia, and weakness.  He is stabilized and  ultimately went to rehab.  He may need further hip surgery.  He has  known coronary artery disease with stents placed in 2000.  He had a  Myoview scan in 2008 with no significant ischemia.  He is not having any  significant chest pain.  He has no major shortness of breath, although  he is quite limited in his activity due to his hip disease.   PAST MEDICAL HISTORY:   ALLERGIES:  PENICILLIN,  SULFA, IODINE, NITRO, and OMEPRAZOLE.   MEDICATIONS:  1. Lasix.  2. Lisinopril.  3. Sertraline.  4. Magnesium oxide.  5. Potassium.  6. Finasteride.  7. Bupropion.  8. Allopurinol.  9. __________.  10.Terazosin.  11.Pantoprazole.  12.Norvasc.  13.Acetamide.  14.Simvastatin.  He is on 80 of a simvastatin with his acetamide.  We      will cut this back to 40 with the recent recommendations from the      FDA.  15.Morphine.  16.Docusate.  17.Metoprolol.  18.Albuterol.  19.Diovan.  20.Catapres patch 0.2 mg weekly to be decrease back to 0.1 mg weekly.   OTHER MEDICAL PROBLEMS:  See the list below.   REVIEW OF SYSTEMS:  The patient is not having any fevers or chills.  He  has discomfort from his hip, as outlined.  He is not had any skin  rashes.  He has no headaches or eye problems.  He is significantly  overweight.  Otherwise, his review of systems is negative.   PHYSICAL EXAMINATION:  VITAL SIGNS:  Blood pressure is 106/66 with a  pulse of 58.  GENERAL:  The patient is oriented to person, time and place.  Affect is  normal.  He  is here with his wife today.  HEENT:  Reveals no xanthelasma.  He has normal extraocular motion.  His  head is normocephalic.  He has no significant dental problems today.  He  is significantly overweight.  LUNGS:  Clear.  Respiratory effort is not labored.  CARDIAC:  Reveals S1 with an S2.  There are no clicks or significant  murmurs.  ABDOMEN:  Obese, but soft.  He is in a wheelchair.  He has no  significant peripheral edema.  SKIN:  He has no major skin rashes.   LABORATORY DATA:  No labs were done today.   PROBLEMS:  Include  1. Coronary artery disease.  He is status post stenting x3 at [facility] in [place], [place] in the past and also stenting      at [facility] in 2000.  His coronary status appears to be  stable.  I do not feel that he needs a Myoview at this time.      However, he does need a 2-D echo to reassess left ventricular      function.  If there is no major change in his left ventricular      function, he can be cleared for potential hip surgery.  2. History of atrial flutter with syncope in the past.  There was      question of radiofrequency catheter ablation to be considered, but      actually we cannot prove that it was ever done.  3. Hypertension.  His blood pressure is actually on the low side      today.  His Catapres patch will be dropped back to 0.1 mg.  4. Dyslipidemia.  Because of recent FDA recommendations we will cut      his simvastatin back from 80-40.  5. Hiatal hernia.  6. Benign prostatic hypertrophy.  7. Severe arthritis with hip problems.  He may need surgery and he      will be cleared if his 2-D is good.  8. Gout.  9. History of a colon resection in the past.   PLAN:  Therefore is to decrease his Catapres patch from 0.1.  He will  have a 2-D echo.  His simvastatin will be decreased to 40 mg daily.     Bryce Abed, MD, [facility]  Electronically Signed    JDK/MedQ  DD: 11/06/2008  DT: 11/07/2008  Job #: [id number]    cc:   Kirstie Peri, MD  [facility]

## 2010-12-29 NOTE — Op Note (Signed)
NAME:  Bryce Berry, Bryce Berry               ACCOUNT NO.:  [id number]   MEDICAL RECORD NO.:  [id number]          PATIENT TYPE:  INP   LOCATION:  0002                         FACILITY:  [facility]   PHYSICIAN:  Madlyn Frankel. Charlann Boxer, M.D.  DATE OF BIRTH:  June 17, 1941   DATE OF PROCEDURE:  03/18/2009  DATE OF DISCHARGE:                               OPERATIVE REPORT   PREOPERATIVE DIAGNOSIS:  Left hip osteoarthritis.   POSTOPERATIVE DIAGNOSIS:  Left hip osteoarthritis.   PROCEDURE:  Left total hip replacement.   COMPONENTS USED:  DePuy hip system with a size 58 Pinnacle cup, 2  cancellous bone screw, a 36/+4 10-degree liner to match the 58 cup, a  size 6 high Tri-Lock stem with a 36/+5 ball.   ASSISTANT:  Dwyane Luo, PA-C   ANESTHESIA:  General.   SPECIMENS:  None.   FINDINGS:  As above.   BLOOD LOSS:  100 mL.   DRAINS:  One Hemovac.   COMPLICATIONS:  None.   INDICATIONS FOR PROCEDURE:  Bryce Berry is a 70 year old gentleman who  presented to the office with a fairly complex history.  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  He has advanced bone-  on-bone arthritis on his left hip.  He was worked up in the office for  infection with ruling things out with an aspiration and lab work.  This  was for both hips.  Based on his current situation, I felt that it would  be best to go ahead and attempt to provide him with a more stable left  hip joint with less pain.  Further discussion down the road with regards  to his right hip can be undertaken as time goes on and that we would  proceed with the left hip.  The risks and benefits of the procedure were  reviewed with him as he was well into the risks of infection but also  stress dislocation, DVT, component failure, need for revision surgery.  Consent was obtained for the benefit of pain relief.   PROCEDURE IN DETAIL:  The patient was brought to the operative  theater.  Once adequate anesthesia, preoperative antibiotics, vancomycin  administered.  The patient was positioned to the right lateral decubitus  positioned with left side up.  The left lower extremity was prescrubbed,  prepped, and draped in a sterile fashion.  Time-out was performed  identifying the patient, planned procedure, and extremity.  The lateral  based incision was made for posterior approach to the hip.  The  iliotibial band and gluteal fascia were incised posteriorly.  The  patient's gluteal musculature was noted to not have as much of a red  color as normal indicating perhaps some disuse atrophy with time.   The short external rotators were then taken down and separated from the  posterior capsule.  An L-capsulotomy was performed.  The hip was  dislocated.  Severe degenerative changes were noted within the femoral  and acetabular side of the hip with bony destruction.  A neck osteotomy was made based off anatomic landmarks with preoperative  templating.  Following this, attention was first directed to the femur.  The femoral canal was opened with a drill, hand reamed once, and  irrigated to prevent emboli.  I then began broaching with a size 1  setting anteversion at 20 degrees.   Following broaching up to a size 5, I packed the femur and attended to  the acetabulum.  Acetabular exposure was obtained.  I had very  degenerative type tissues.  There was calcification of the labrum.  The  labral tissue was removed.  The bony acetabulum was identified and a  curette used to remove a lot of fibrinous tissue within the acetabulum  itself.  I began reaming with a 50 reamer and reamed up to 57 reamer at  which point I impacted a 58 Pinnacle cup.   Two cancellous bone screws were placed and a 36 neutral, +4 liner was  placed.  At this point, I felt that my anteversion of the acetabulum was  adequate at 20 degrees and abduction appeared to be 35-40 degrees.  A  trial reduction  was now carried out and I placed a 5 broach that sat at  the level of my neck cut.  However, initially, I went with a standard  offset based on his radiographs.  I immediately went over to the high  offset neck length with 36/1.5 ball.  Combined anteversion appeared to  be about 40-45 degrees.  However, there was a little bit of subluxation  perhaps related to soft tissue impingement with 7 degrees of internal  rotation, neutral abduction, and hip flexion.  Given this, I decided to  go ahead and provide a little extra length on this side to provide more  stability.  In addition, I decided to use of +4, 10-degree liner.  Given  all of these changes, we removed the trial components, a hole eliminator  was placed in the acetabular shell, and a 36, 58, +4, 10-degree liner  was impacted with a 10-degree portion positioned at approximately 2  o'clock for this left hip.  It was impacted and well seated.   I broached up to a size 6 and then used a 6 high Tri-Lock stem.  It was  impacted at the level where the broach was which sat at this point about  3 mm above the neck cut.  I then retrialed with a 36/+5 ball.  With this  combination, the hip was very stable without evidence of any  subluxation.  Please note that I did remove some anterior osteophyte  which was probably was some old calcified labrum.  The combination of  all of these moves made a very stable hip.  Given the condition of his  right hip, leg lengthening was not a concern to me as much more  concerning would be any concerns of just instability.   The final 36/+5 ball was then impacted onto a clean and dry trunnion and  the hip was reduced.   The hip was irrigated throughout the case.  Again at this point, I  reapproximated the posterior capsule superior leaflet using #1 Vicryl.  We placed a medium Hemovac drain deep in the subcapsular tissues.  The  iliotibial band and gluteal fascia were then reapproximated using #1  Vicryl.  A  2-0 Vicryl was used in the subcu layer and running 4-0  Monocryl in the skin.  The skin was cleaned, dried, and dressed  sterilely with Steri-Strips and a Mepilex dressing.  He was then brought  to the recovery room, extubated, in stable condition, tolerating the  procedure well.      Madlyn Frankel Charlann Boxer, M.D.  Electronically Signed     MDO/MEDQ  D:  03/18/2009  T:  03/18/2009  Job:  [id number]

## 2010-12-29 NOTE — H&P (Signed)
NAME:  Bryce Berry, Bryce Berry               ACCOUNT NO.:  [id number]   MEDICAL RECORD NO.:  [id number]          PATIENT TYPE:  INP   LOCATION:                               FACILITY[facility]  [facility]   PHYSICIAN:  Madlyn Frankel. Charlann Boxer, M.D.       DATE OF BIRTH:   DATE OF ADMISSION:  03/18/2009  DATE OF DISCHARGE:                              HISTORY & PHYSICAL   PROCEDURE:  Left total hip replacement.   CHIEF COMPLAINT:  Left hip pain.   HISTORY OF PRESENT ILLNESS:  A 70 year old male with a history of left  hip pain secondary to osteoarthritis.  It is bone on bone in nature.  It  has been refractory to all conservative treatment.  He does have a  history of right hip girdlestone.  Therefore, the left extremity is in  important and helps in transfers.  He is limited in his ambulatory  ability due to this right hip girdlestone.   PRIMARY CARE PHYSICIAN:  Dr. Sherril Croon.   OTHER PHYSICIANS:  Luis Abed, MD in Cardiology.   PAST MEDICAL HISTORY:  1. Osteoarthritis.  2. COPD/emphysema.  3. Hypertension.  4. Coronary artery disease.  5. Hypercholesterolemia.  6. Coronary artery disease with heart stenting.  7. Depression.  8. Post-traumatic stress disorder.  9. Reflux disease.  10.Irritable bowel syndrome.  11.Prostate disease.  12.Recurrent urinary tract infections.  13.Osteoporosis.   PREVIOUS SURGERIES:  1. Right knee replacement.  2. Left knee replacement.  3. Right hip girdlestone.  4. Bowel resection.  5. Back surgery.   SOCIAL HISTORY:  Married, retired from [employer].  Primary caregiver  postoperatively, he is planning a rehab stay at the [facility] in [place].   ALLERGIES:  NO KNOWN DRUG ALLERGIES.   MEDICATIONS:  1. Metoprolol.  2. Amlodipine.  3. Colchicine.  4. Lasix.  5. K-Dur.  6. Finasteride.  7. Bupropion.  8. Allopurinol.  9. H2 blockers.  Verify all dosages and frequencies with the patient      at time of admission.   REVIEW OF SYSTEMS:   DERMATOLOGY:  He has intermittent itching.  RESPIRATORY:  He has cough and wheezing.  Last chest x-ray was done in  May 2010.  GI:  He has diarrhea, constipation.  GENITOURINARY:  He has  issues with urinary incontinence and weak stream.  MUSCULOSKELETAL:  He  has joint swelling, muscular pain, back pain.  Otherwise see HPI.   PHYSICAL EXAMINATION:  VITAL SIGNS:  Pulse 72, respirations 16, blood  pressure 104/70.  GENERAL:  Awake, alert and oriented in wheelchair.  HEENT:  Normocephalic.  NECK:  Supple.  No carotid bruits.  CHEST:  Lung sounds are clear to auscultation bilaterally.  BREASTS:  Deferred.  HEART:  S1-S2 distinct.  ABDOMEN:  Soft, nontender, bowel sounds are present.  PELVIS:  Stable.  GENITOURINARY:  Deferred.  EXTREMITIES:  Left hip has increased pain with range of motion.  SKIN:  No cellulitis of the left hip.  NEUROLOGIC:  Intact distal sensibilities left lower extremity.   LABORATORY DATA:  Labs, EKG, chest x-ray are  all pending presurgical  testing.   IMPRESSION:  Left hip osteoarthritis.   PLAN OF ACTION:  Left total hip replacement by Dr. Charlann Boxer at [facility]  on March 18, 2009.  Risks, complications were discussed.  The patient is  planning a rehab stay at [facility] in [place]  postoperatively.     ______________________________  Yetta Glassman [address], [place]      Madlyn Frankel. Charlann Boxer, M.D.  Electronically Signed    BLM/MEDQ  D:  03/05/2009  T:  03/05/2009  Job:  [id number]   cc:   Sherril Croon, MD   Luis Abed, MD, [facility]  [address]  [address]  [place]  [place] [address]

## 2010-12-29 NOTE — Assessment & Plan Note (Signed)
OFFICE VISIT   Bryce Berry, Bryce Berry  DOB:  [birth year]                                       06/25/2009  UEAVW#:[id number]   The patient is a 70 year old male referred by Dr. Sherril Croon for evaluation of  lower extremity claudication.   CHIEF COMPLAINT:  Left leg pain.   PRESENT ILLNESS:  The patient has experienced pain in his left leg which  extends from the groin all way down to the leg and in toes 2 and 3 on  the left foot.  This has been going on for the last 2 months.  He has  been nonambulatory since July 2009 when a right hip replacement became  infected and had to be removed.  He is still able to transfer primarily  using his left leg.  His right leg is now approximately 2 inches shorter  than the left.   He denies classic rest pain type symptoms.  He described some type of a  stent procedure that was done in [place] 10 years ago.  This was also  done on his left leg.   His atherosclerotic risk factors include hypertension, elevated  cholesterol both of which are well controlled.  He also has history  coronary artery disease that is followed by Dr. Myrtis Ser.  He is a former  smoker and quit a year and a half ago.  He denies history diabetes.   PAST MEDICAL HISTORY:  Also remarkable for COPD.   PAST SURGICAL HISTORY:  He had bilateral knee replacements, the right  has been replaced twice, the left once.  He has had lumbar spine  surgery, colon resection for diverticulitis, multiple prior coronary  stents.   FAMILY HISTORY:  Is not remarkable for vascular disease at young age.   SOCIAL HISTORY:  He is married and has one child.  Smoking history as  listed above.  Does not consume alcohol regularly.   REVIEW OF SYSTEMS:  He is 6 feet 2 inches, 287 pounds.  He has been  gaining some weight over the last few years.  Full review of systems was  performed and this was all negative except for musculoskeletal which is  remarkable for multiple joint arthritis  pain, pulmonary, which is  remarkable for occasional wheezing and productive cough.  Otherwise his  12-point review of systems was unremarkable.   Medical records from Dr. Sherril Croon' office as well as ABIs performed at  [facility] were reviewed.  His ABIs at [facility] on June 02, 2009 were 0.6 bilaterally.  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   MEDICATIONS:  Include:  1. Furosemide.  2. Lisinopril.  3. Sertraline.  4. Magnesium oxide.  5. K-Dur.  6. Finasteride.  7. Bupropion.  8. Allopurinol.  9. Indocin.  10.Metoprolol,  11.Diovan.  12.Amlodipine.  13.Pantoprazole.  14.Oxycodone.  15.Colchicine.  16.Benadryl.  17.Mometasone inhaler.  18.Albuterol ipratropium nebulizer.  19.Fenofibrate.  20.Aspirin.  21.Catapres patch.  22.Terazosin.  23.Ezetimibe.  24.Simvastatin.  25.Morphine SA 30 mg twice a day.  26.Colace.  27.Aspirin 325 mg once a day.   ALLERGIES:  He has listed allergies to PENICILLIN, IODINE AND PAPER  TAPE.   PHYSICAL EXAM:  Blood pressure 121/79 in the left arm, heart rate 67 and  regular.  Temperature is 98.4.  HEENT:  Unremarkable.  He has no  lymphadenopathy.  Neck:  Has 2+ carotid pulses without bruit.  Chest is  clear to auscultation.  Heart:  Exam is regular rate rhythm without  murmur.  Abdomen is obese, soft, nontender, nondistended with well-  healed midline scar.  Extremities:  He has 2+ radial pulses bilaterally.  He has 1+ right femoral pulse.  He has absent left femoral pulse.  He  has absent popliteal and pedal pulses bilaterally.  He has valgus  deformity of both knee joints with well-healed knee replacement scars.  The right lower extremity is approximately 2 inches shorter and  externally rotated compared to the left.  Neurologic exam shows  symmetric upper extremity strength which is 5/5.  He also has fairly  symmetric lower extremity motor strength which is  4/5.  Sensation is  intact.  Skin:  Has no rashes.   In summary, Mr. 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  The left leg is more symptomatic than  the right.  He probably has multifactorial reasons for his left lower  extremity pain.  I believe this is probably secondary to problems with  his back as well as his orthopedic problems on the left side.  Also by  the fact that the left leg is more stressed compared to the right due to  his problem with the right hip.  He currently does not have limb  threatening ischemia and I have reassured him that his risk of limb loss  lifetime is less than 5% for his left lower extremity if he continues to  refrain from smoking and remains free of diabetes.  If his symptoms  continue to worsen over time we could consider an arteriogram to  reevaluate his left lower extremity system.  I believe the best option  for right now though is continued conservative management with risk  factor modification, aspirin use, and he is going to try to exercise  some in the pool to lose some weight.  If his symptoms continue to  persist or become worse we could consider an arteriogram at some point  in the future.  He might be a reasonable candidate for percutaneous  intervention.  However, he is not a very good open operative candidate  and I would not consider that in his current debilitated state.  He will  follow up with me in six months' time for repeat ABIs or sooner if he  wishes to have some sort of percutaneous option entertained.   [name], MD  Electronically Signed   CEF/MEDQ  D:  06/25/2009  T:  06/26/2009  Job:  [id number]   cc:   Doreen Beam, MD

## 2010-12-30 NOTE — Op Note (Signed)
  NAME:  Bryce Berry, Bryce Berry               ACCOUNT NO.:  [id number]  MEDICAL RECORD NO.:  [id number]           PATIENT TYPE:  I  LOCATION:  1612                         FACILITY[facility]  [facility]  PHYSICIAN:  Marlowe Kays, M.D.  DATE OF BIRTH:  [birth year]  DATE OF PROCEDURE: DATE OF DISCHARGE:                              OPERATIVE REPORT   PREOPERATIVE DIAGNOSIS:  Recurrent dislocations right total hip arthroplasty.  POSTOPERATIVE DIAGNOSIS:  Recurrent dislocations right total hip arthroplasty.  OPERATION:  Closed reduction of recurrent dislocation right total hip arthroplasty.  SURGEON:  Marlowe Kays, M.D.  ASSISTANTDruscilla Brownie. Idolina Primer, [facility], [place]  ANESTHESIA:  General pathology.  DESCRIPTION OF PROCEDURE:  He apparently has had multiple dislocations once passes 4, the others at 6.  We had films of one dislocation in January. This apparently came out yesterday and it is unclear while they came to the emergency room late this afternoon.  PROCEDURE:  Satisfactory general anesthesia.  C-arm deployed . The hip was superiorly dislocated; and with traction and rotation, it appeared to be reduced concentrically.  It rotated in normal fashion and also seemed to be fairly loose on initial reduction.  He was placed in abduction pillow with plans to admit him overnight for observation and probably place him in a brace, but Dr. __________ who is his primary treating physician, will be notified tomorrow for further instructions and care.          ______________________________ Marlowe Kays, M.D.     JA/MEDQ  D:  12/18/2010  T:  12/19/2010  Job:  [id number]  Electronically Signed by Marlowe Kays M.D. on 12/30/2010 04:20:58 PM

## 2010-12-30 NOTE — H&P (Signed)
  NAME:  Bryce Berry, ASCHOFF NO.:  [id number]  MEDICAL RECORD NO.:  [id number]           PATIENT TYPE:  I  LOCATION:  1612                         FACILITY:  [facility]  PHYSICIAN:  Marlowe Kays, M.D.  DATE OF BIRTH:  Jun 04, 1941  DATE OF ADMISSION:  12/18/2010 DATE OF DISCHARGE:                             HISTORY & PHYSICAL   ADDENDUM: After further interviewing this patient, I found out both from him and his wife that this dislocation of his right total hip occurred yesterday around 5 or 5:30 in the evening.  The mechanism of the injury was getting into bed with the same.  They waited overnight.  He put on his knee immobilizer to hopefully it would "go back in."  Unfortunately, it did not and he presented to the emergency room as in my previously dictated history and physical.     Dooley L. Cherlynn June.   ______________________________ Marlowe Kays, M.D.    DLU/MEDQ  D:  12/18/2010  T:  12/19/2010  Job:  [id number]  cc:   Dr. Clydene Pugh  Electronically Signed by Marlowe Kays M.D. on 12/30/2010 09:81:19 PM

## 2010-12-30 NOTE — H&P (Signed)
NAME:  Bryce Berry, Bryce Berry NO.:  [id number]  MEDICAL RECORD NO.:  [id number]           PATIENT TYPE:  I  LOCATION:  1612                         [facility]ACILITY:  [facility]  PHYSICIAN:  Marlowe Kays, M.D.  DATE OF BIRTH:  05-04-1941  DATE OF ADMISSION:  12/18/2010 DATE OF DISCHARGE:                             HISTORY & PHYSICAL   CHIEF COMPLAINT:  "Problems with my right hip."  PRESENT ILLNESS:  This 70 year old white male was at his home earlier today sitting on the side of his bed in his bedroom.  He was lifting his legs in order to get in to bed to take a nap when he noticed some hip pain.  His wife immediately noticed that he had shortening of his right lower extremity and difficulty doing range of motion of the right hip. He has had previous dislocations to this hip over the years, last one being in January of this year.  They were well aware that the patient had indeed re-dislocated his right hip and brought him to the emergency room.  X-rays here revealed a superior dislocation of the hip without evidence of any fractures.  No loosening of components were noted as well.  When seen in the emergency room, the patient was in no real acute distress.  He had is well aware that he had dislocated his hip. Neurovascularly intact to the right lower extremity including motor, sensory, and circulation.  It was decided that we will go ahead with him as operating room schedule permits for open versus closed reduction of the right total hip replacement prosthesis.  PAST MEDICAL HISTORY:  The patient has been in relatively good health. Dr. Sherril Croon in [place] is his family physician.  MEDICAL HISTORY:  Includes COPD, hypertension, gout.  He also has coronary artery disease with stents in placement.  SURGICAL HISTORY:  Includes bilateral knee replacements, bilateral hip replacements, and he had colectomy of at least 12 inches for some unknown problem done in the past.  He also had  a L3-L4 spinal fusion.  CURRENT MEDICATIONS:  Allopurinol, ascorbic acid, aspirin, Bactrim, Cardizem, digoxin, doxycycline monohydrate, finasteride, folic acid, Klor-Con M 10, Lasix, lisinopril, Lopressor, Mylanta, Protonix, Robaxin, terazosin.  ALLERGIES:  He is allergic to PENICILLIN.  SOCIAL HISTORY:  The patient neither smokes nor drinks.  FAMILY HISTORY:  Noncontributory.  REVIEW OF SYSTEMS:  CNS:  No seizures or paralysis.  No Numbness or double vision.  RESPIRATORY:  The patient has dyspnea and some shortness of breath with exertion.  No hemoptysis.  GASTROINTESTINAL:  No nausea, vomiting, melena or bloody stools.  GENITOURINARY:  No discharge, dysuria, hematuria.  MUSCULOSKELETAL:  Primarily in present illness. CARDIOVASCULAR:  No chest pain, no angina, no orthopnea.  PHYSICAL EXAMINATION:  GENERAL:  Alert, cooperative, friendly, fully- oriented 70 year old black male seen lying on the emergency room stretcher accompanied by his wife. VITAL SIGNS:  Blood pressure 112/77, pulse 74 and regular, temperature 98.6, respirations 16 and unlabored. HEENT:  Normocephalic, PERRLA.  EOMs intact.  Oropharynx is clear.  He is edentulous. NECK:  Supple.  No lymphadenopathy. CHEST:  Clear to auscultation.  No rhonchi,  no rales, no wheezes with the patient semisupine. HEART:  Regular rate and rhythm.  No murmurs are heard. ABDOMEN:  Obese, soft, nontender.  Liver and spleen not felt. GENITALIA:  Rectal not done, not pertinent to present illness. EXTREMITIES:  Right lower extremity is seen shortened of at least 1-1/2 inches. NEURO:  Neurovascularly intact.  ADMISSION DIAGNOSES: 1. Superior dislocation, right total hip replacement prosthesis. 2. Hypertension. 3. Acute coronary artery disease. 4. Chronic obstructive pulmonary disease. 5. Gout.  PLAN:  The patient will undergo closed versus open reduction right total hip prosthesis dislocation with possible placement of hip  abduction brace.  We will keep him overnight for recovery from anesthesia as well as observation until we can get the brace ordered, then we will have him in a knee immobilizer to prevent flexion of the hip.     Dooley L. Cherlynn June.   ______________________________ Marlowe Kays, M.D.    DLU/MEDQ  D:  12/18/2010  T:  12/19/2010  Job:  [id number]  cc:   Doreen Beam, MD Fax: [phone]  Electronically Signed by Marlowe Kays M.D. on 12/30/2010 04:20:38 PM

## 2010-12-31 ENCOUNTER — Encounter: Payer: Self-pay | Admitting: Cardiology

## 2010-12-31 NOTE — Consult Note (Signed)
NAME:  Bryce Berry, Bryce Berry NO.:  [id number]  MEDICAL RECORD NO.:  [id number]           PATIENT TYPE:  E  LOCATION:  WLED                         FACILITY:  [facility]  PHYSICIAN:  Leonides Grills, M.D.     DATE OF BIRTH:  01-11-1941  DATE OF CONSULTATION: DATE OF DISCHARGE:                                CONSULTATION   CHIEF COMPLAINT:  Right hip pain.  HISTORY OF PRESENT ILLNESS:  Mr. Fermin is a 70 year old male who presents to [facility] ER today with a chief complaint of right hip pain.  The patient underwent hip replacement by Dr. Charlann Boxer in October 2011.  The patient seen 2 days ago in the ER and was taken to the OR for closed reduction of his right hip, which was dislocated.  He states that he took his abduction brace off around 3 a.m. today, rolled over in bed and had some groin pain.  His wife later this morning noticed his leg was shortened.  The patient was brought to the [facility] ER, where radiographs showed the right hip to be posteriorly dislocated.  PAST MEDICAL HISTORY:  Positive for; 1. COPD. 2. Coronary artery disease. 3. Diverticulitis. 4. GERD. 5. Gout. 6. Hiatal hernia. 7. Hypertension. 8. PTSD. 9. History of coronary artery stenting.  SURGICAL HISTORY: 1. Colon resection. 2. Bilateral hip replacements. 3. Knee replacement. 4. Spinal fusion. 5. Bilateral knee splints.  SOCIAL HISTORY:  Denies any tobacco use.  No drug abuse.  Occasional EtOH.  DRUG ALLERGIES: 1. PENICILLIN. 2. IODINE.  MEDICATIONS:  Allopurinol, Toprol, multivitamin, Mylanta, Protonix, hydrocodone, Lasix, terazosin, finasteride, ascorbic acid, Nitrostat, Robaxin, aspirin, folic acid, Klor-Con, Cardizem, fish oil, calcium plus D, iron with stool softener, Bactrim, doxycycline.  REVIEW OF SYSTEMS:  Positive for coronary artery disease, COPD, hypertension, diverticulitis, gout, PTSD, right hip pain; otherwise negative and noncontributory.  PHYSICAL EXAMINATION:   VITAL SIGNS:  Blood pressure 132/76, pulse 71, respiratory rate 16, temperature is 97.7. GENERAL:  The patient is a well-developed, well-nourished male, in no acute distress. RESPIRATIONS:  Diaphragms rise about symmetrically. PSYCH:  The patient is alert, oriented x3. VASCULAR:  Dorsal pedal pulses 2+ bilaterally. NEURO:  Sensation intact in bilateral feet to light touch, L4 to S1. EXTREMITIES:  Lower extremities, right lower extremity shortened and externally rotated.  Otherwise, extremities without any gross deformities. SKIN:  No rash, no lesion, no ulceration in bilateral lower extremities.  RADIOGRAPHS:  Radiographs 3-view, AP pelvis and AP right hip and lateral right hip shows the patient to be status post bilateral total hip arthroplasty.  There is posterosuperior dislocation of right hip.  The left hip is well located without hardware failure.  No acute fracture identified in pelvis or bilateral hips.  IMPRESSION:  Recurrent right total hip dislocation.  PLAN:  Conscious sedation in the ER with a closed reduction of the right total hip.  The patient will follow with Dr. Charlann Boxer in office next week. The patient is to call [phone] for appointment.  The patient needs to be in abduction brace or knee immobilizer at all times.     Richardean Canal, P.A.  ______________________________ Leonides Grills, M.D.    GC/MEDQ  D:  12/20/2010  T:  12/20/2010  Job:  [id number]  cc:   Leonides Grills, M.D. Fax: [phone]  Electronically Signed by Richardean Canal P.A. on 12/21/2010 12:19:52 PM Electronically Signed by Leonides Grills M.D. on 12/31/2010 03:09:18 PM

## 2010-12-31 NOTE — Consult Note (Signed)
NAME:  Bryce Berry, Bryce Berry               ACCOUNT NO.:  [id number]  MEDICAL RECORD NO.:  [id number]           PATIENT TYPE:  I  LOCATION:  1229                         FACILITY:  [facility]  PHYSICIAN:  Thad Ranger, MD       DATE OF BIRTH:  26-Sep-1940  DATE OF CONSULTATION: DATE OF DISCHARGE:                                CONSULTATION   REASON FOR CONSULTATION:  Medical co-management.  REQUESTING PHYSICIAN:  Orthopedics service.  BRIEF HISTORY OF PRESENT ILLNESS AND HOSPITALIZATION COURSE:  Mr. [name] is a 70 year old male who was admitted under orthopedic service on Dec 20, 2010, for revision of the right hip.  The patient underwent right total hip revision on Dec 21, 2010.  Postoperatively, the patient developed paroxysmal atrial fibrillation with rapid ventricular response on Dec 22, 2010.  The patient was at that time placed on Cardizem drip; however, the patient also developed hypotension requiring vasopressors which has been transitioned at present.  He also required IV digoxin loading.  Postoperatively, the patient also developed acute blood loss anemia from the surgery and hemoglobin was noted to be 7.4 this morning. Medicine service was also consulted for co-management.  PAST MEDICAL HISTORY: 1. History of osteoarthritis. 2. COPD with emphysema. 3. Hypertension. 4. Hyperlipidemia. 5. History of coronary artery disease, status post stent. 6. History of questionable paroxysmal atrial flutter. 7. History of depression. 8. GERD. 9. BPH. 10.History of peripheral arterial disease/peripheral vascular disease. 11.Multiple hip replacement surgery. 12.Bilateral total knee arthroplasty. 13.L3-L4 fusion. 14.History of colonic resection. 15.History of prostate surgery.  SOCIAL HISTORY:  The patient quit smoking 20 years ago.  Denies any alcohol use.  PHYSICAL EXAMINATION:  VITAL SIGNS:  Temperature 98, heart rate 101 to 108, sinus rhythm.  BP 144/66, respiration 18, and O2 sats 98%  on room air. GENERAL:  The patient is alert and awake, currently also working with physical therapy. HEENT:  Unremarkable.  Anicteric sclerae and conjunctivae.  Pupils reactive to light and accommodation.  EOMI. NECK:  Supple.  No lymphadenopathy, no JVD. LUNGS:  Decreased breath sounds at the bases. CARDIOVASCULAR:  S1, S2 clear.  Currently, regular rate and rhythm. ABDOMEN:  Obese.  Normal bowel sounds.  No tenderness. EXTREMITIES:  1+ bilateral edema with Foley catheter.  No cyanosis or clubbing. NEUROLOGIC:  No focal neurological deficits noted.  DIAGNOSTIC DATA:  BNP 284.2.  Lactic acid 2.8. Wound culture, rare wbc's present, both PMN and mononuclear.  BMET, sodium 132, potassium 4.0, BUN 13, creatinine 1.0.  CBC showed white count of 6.8, hemoglobin 7.4, hematocrit 22.2, platelets 104.  IMPRESSION AND PLAN: 1. Paroxysmal atrial fibrillation with hypotension, currently in     normal sinus rhythm and rate better controlled.  The patient's     heart rate is noted to be in low 100s on ambulating with the     physical therapy.  He is off vasopressors.  Cardiology was also     consulted by Orthopedic Service and recommend continuing Lopressor     and increasing the Cardizem dose.  No recommendations of Coumadin     at present time from Cardiology Service. 2. Anemia.  Postoperative day 2, given his cardiac history and recent     event of paroxysmal atrial fibrillation with RVR, recommend to     transfuse 2 units packed RBCs to keep hematocrit around 28.  I will     also give 1 dose of Lasix in between the transfusion. 3. History of coronary artery disease.  Recommendations per Cardiology     service. 4. Hypertension, currently stable. 5. Chronic obstructive pulmonary disease, stable.  No significant     rhonchi or wheezing noted.  He is satting in 90s.  Transiently, the     patient did have a drop in his sats on ambulation to low 80s.     However, recovered fairly quickly.  I will  also place the patient     on Xopenex breathing treatments. 6. Gastroesophageal reflux disease.  The patient placed on proton pump     inhibitors.  DISPOSITION:  Per orthopedic service.  They will continue to follow with you.     Thad Ranger, MD     RR/MEDQ  D:  12/23/2010  T:  12/23/2010  Job:  [id number]  cc:   Madlyn Frankel Charlann Boxer, M.D. Fax: [phone]  Electronically Signed by RIPUDEEP RAI  on 12/31/2010 01:35:44 PM

## 2010-12-31 NOTE — Op Note (Signed)
NAME:  Bryce Berry, Bryce Berry NO.:  [id number]  MEDICAL RECORD NO.:  [id number]           PATIENT TYPE:  E  LOCATION:  WLED                         [facility]ACILITY[facility]  PHYSICIAN:  Leonides Grills, M.D.     DATE OF BIRTH:  10-Jul-1941  DATE OF PROCEDURE:  12/20/2010 DATE OF DISCHARGE:                              OPERATIVE REPORT   PREOPERATIVE DIAGNOSIS:  Posterior right dislocated total hip arthroplasty.  POSTOPERATIVE DIAGNOSIS:  Posterior right dislocated total hip arthroplasty.  OPERATION:  Closed reduction under anesthesia of dislocated right total hip arthroplasty.  ANESTHESIA:  General with etomiade, morphine and Versed.  SURGEON:  Leonides Grills, M.D.  ASSISTANT:  Richardean Canal, P.A.  ESTIMATED BLOOD LOSS:  None.  Postreduction x-ray showed subluxation with probable soft tissue interposition.  DISPOSITION:  Stable to PR.  INDICATIONS:  This is a 70 year old gentleman who has had a dislocated right total hip arthroplasty in the past required revision.  Post revision was doing well up until Friday when he dislocated his total hip arthroplasty.  This was reduced by Dr. Simonne Come at that time and he was sent home.  While he was at home, he was having still persistent right hip pain.  His wife noticed that the one leg was shorter than the other. She does not recall when the hip dislocated.  It may have happened in the middle of the night, he is not sure.  He says he has always had hip pain and difficulty moving it.  He then presented to [facility] ED where x-rays were obtained and was consulted for further evaluation and treatment.  He was consented to above procedure.  The possibility of not being able to reduce it, iatrogenic fracture and recurrence of subluxation and possibility of revision were all explained, questions were encouraged and answered.  In the ED,  initially morphine and Versed were administered.  We then administered etomidate and once he  was significantly relaxed, we were able to reduce the hip relatively easily.  Once it was then with just slight internal rotation of the hip it would again sublux out, so we then re-reduced it once again but again it was not as satisfying clunk like we typically get.  We obtained an x-ray and it showed that on AP and lateral views that it was actually subluxed slightly superiorly.  We thought that it may have been still posterior to the cup, so we obtained a lateral which verified that this was anterior to the cup but there appeared to be some type of soft tissue interpositioning due to the amount of space between where the ball and the actual cup.  He was in hip abduction splint.  We obviously reapplied the hip abduction splint. The patient was stable postprocedure.  I explained this to both the patient as well as the wife that there may be some soft tissue interpositioning of the implant and the question is did he have soft tissue positioning after his reduction on Friday and this was he possibly may have subluxed even earlier or he has no true time when he felt it popped out of  place from Friday until now.  We will discuss this case with Dr. Charlann Boxer and we admitted him today and kept him n.p.o. for possible open reduction tomorrow.  We went also in great detail with the patient as well as his wife and all questions were encouraged and answered.     Leonides Grills, M.D.     PB/MEDQ  D:  12/20/2010  T:  12/20/2010  Job:  [id number]  Electronically Signed by Leonides Grills M.D. on 12/31/2010 03:09:15 PM

## 2011-01-01 NOTE — Assessment & Plan Note (Signed)
OFFICE VISIT   Bryce, Berry  DOB:  05/29/1941                                       01/28/2010  NUUVO#:[id number]   The patient returns for followup today.  He was last seen in November  2010 for lower extremity arterial occlusive disease.  He is currently  not ambulatory due to removal of an infected right hip prosthesis.  He  still is able to transfer some but for the most part is confined to a  wheelchair.  He denies any rest pain in his foot.  He denies any  symptoms of claudication.   PAST MEDICAL HISTORY:  Chronic medical problems include hypertension,  elevated cholesterol and coronary artery disease.  These are all  currently controlled and followed by Dr. Sherril Croon and Dr. Myrtis Ser.  He denies  any history of diabetes.  Past medical history is also significant for  COPD.   REVIEW OF SYSTEMS:  He denies any shortness of breath or chest pain  currently.   The family and social history is unchanged from November 2010.   PHYSICAL EXAMINATION:  Blood pressure 115/76 in the left arm, heart rate  85 and regular.  Oxygen saturation is 97% on room air.  HEENT:  Unremarkable.  Chest:  Clear to auscultation.  Cardiac:  Regular rate  and rhythm without murmur.  Abdomen:  Obese, soft, nontender,  nondistended.  No masses.  Musculoskeletal:  His right leg is  approximately 2 inches shorter than his left.  Skin:  Has no open ulcers  or rashes.  Neurologic:  Symmetric upper extremity and lower extremity  motor strength which is 4/5.  The lower extremities have absent pedal  pulses.  He has 2+ femoral pulses bilaterally.   He had bilateral ABIs performed today which were reviewed and  interpreted by myself.  They were 0.91 on the right and 0.78 on the  left.  Both of these are increased from his previous ABIs.   In summary, the patient's current perfusion to his lower extremities is  adequate for wound healing.  He is currently not at risk of amputation.  Since  he is not ambulatory, he does not really have any symptoms related  to this.  I believe the best course of management for now would be  continued risk factor modification with repeat ABIs in 6 months' time.  He is hopeful that he will be able to have his hip fixed at some point  in the future and then possibly become ambulatory again at that point.     [name], MD  Electronically Signed   CEF/MEDQ  D:  01/29/2010  T:  01/29/2010  Job:  [id number]   cc:   Doreen Beam, MD

## 2011-01-06 NOTE — Consult Note (Signed)
NAME:  Bryce Berry, Bryce Berry               ACCOUNT NO.:  [id number]  MEDICAL RECORD NO.:  [id number]           PATIENT TYPE:  I  LOCATION:  1229                         FACILITY:  [facility]  PHYSICIAN:  Noralyn Pick. Eden Emms, MD, FACCDATE OF BIRTH:  October 09, 1940  DATE OF CONSULTATION: DATE OF DISCHARGE:                                CONSULTATION   A 70 year old patient of Dr. Myrtis Ser admitted for revision of his right hip by Dr. Charlann Boxer.  Postop course complicated by significant anemia with hematocrit of 22 and paroxysmal atrial fibrillation.  The patient has had a history of paroxysmal atrial fibrillation with his last hip surgery in January.  He also had rapid AFIB which converted spontaneously.  His AFIB has already converted after less than 36 hours.  He has not been on Coumadin in the past.  His CHADS1 score is only 1.  The patient is currently hemodynamically stable, complaining of right hip pain.  He has a distant history of coronary disease with stenting in [place] in 2000.  He had a nonischemic Myoview in 2008 and has an EF of 50-55% by fairly recent echo.  He is currently without cardiology complaints.  He has no palpitations, dyspnea, PND or orthopnea.  No syncope.  REVIEW OF SYSTEMS:  His 10-point review of systems is otherwise negative.  PAST MEDICAL HISTORY:  Remarkable for multiple orthopedic procedures including bilateral hip replacements and bilateral knee replacements, history of COPD, history of coronary disease stable with no cath since he has been in [place] and [place], history of diverticulitis, GERD, gout, history of hiatal hernia, history of hypertension.  PREVIOUS SURGICAL HISTORY:  Includes colon resection, bilateral hip replacements, bilateral knee replacements, spinal fusion.  SOCIAL HISTORY:  The patient is happily married.  His wife is very knowledgeable and was in the room with him.  He is fairly sedentary given his multiple orthopedic issues.  He does not smoke or  drink.  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  ALLERGIES:  He is allergic to PENICILLIN and IODINE.  CURRENT MEDICATIONS:  In the hospital include: 1. Allopurinol 100 mg a day. 2. Pletal 100 mg a day. 3. Cardizem 240 a day. 4. Zetia 10 a day. 5. Iron 325 mg t.i.d. 6. Lasix 20 mg a day. 7. Metoprolol 100 mg b.i.d. 8. Protonix. 9. Hytrin 5 mg q.h.s. 10.Antibiotics. 11.Inhalers. 12.P.r.n. medicines for pain.  PHYSICAL EXAMINATION:  GENERAL:  His exam is remarkable for an overweight black male in no distress. VITAL SIGNS:  Blood pressure is 128/70, pulse is 80 and regular, respiratory rate 14, afebrile. HEENT:  Unremarkable. NECK:  Carotids are without bruit.  No lymphadenopathy, thyromegaly or JVP elevation. LUNGS:  Have decreased breath sounds at the base.  There is S1, S2 with distant heart sounds. ABDOMEN:  Protuberant.  Bowel sounds are positive.  No tenderness.  No hepatosplenomegaly, hepatojugular reflux. EXTREMITIES:  Distal pulses are difficult to palpate.  He has bilateral knee replacements.  He has +1 bilateral edema.  He has Foley catheter. NEUROLOGIC:  Nonfocal. SKIN:  Warm and dry. MUSCULAR:  Exam not performed.  His  current EKG and telemetry show normal sinus rhythm.  He has a chest x-ray from May 4 that showed mild cardiomegaly and no CHF.  His current lab work is remarkable for a potassium of 4.0, creatinine of 1.01, hematocrit of 22.2.  IMPRESSION/PLAN: 1. Paroxysmal atrial fibrillation, history of such before, likely     precipitated by surgery, pain and anemia, currently converted to     sinus rhythm.  No indication for Coumadin unless there is a need     for it regarding his hip surgery.  The patient will continue his     Lopressor 100 b.i.d. and his Cardizem at slightly higher dose of     240 a day.  His normal home dose is 120 a day.  There does not     appear to be any acute ischemic event. 2. Significant anemia in the setting  of orthopedic surgery.  Given the     patient's cardiac history and PAF, I would consider transfusing him     to a hematocrit of 28.  I will leave this up to the orthopedic     surgeons.  Good pain control is also important as well as DVT     prophylaxis. 3. History of coronary disease, distant, nonischemic Myoview in 2008.     No ischemic changes on ECG.  Continue current medical therapy which     should include at least 81 mg of aspirin. 4. The patient can follow up with Dr. Myrtis Ser as an outpatient.     Noralyn Pick. Eden Emms, MD, [facility]     PCN/MEDQ  D:  12/23/2010  T:  12/23/2010  Job:  [id number]  cc:   [facility]  Electronically Signed by Charlton Haws MD [facility] on 01/06/2011 03:42:28 PM

## 2011-01-19 ENCOUNTER — Ambulatory Visit: Payer: Medicare Other | Admitting: Cardiology

## 2011-01-26 ENCOUNTER — Encounter: Payer: Self-pay | Admitting: Cardiology

## 2011-01-28 ENCOUNTER — Encounter (INDEPENDENT_AMBULATORY_CARE_PROVIDER_SITE_OTHER): Payer: Medicare Other

## 2011-01-28 DIAGNOSIS — I739 Peripheral vascular disease, unspecified: Secondary | ICD-10-CM

## 2011-02-03 ENCOUNTER — Telehealth: Payer: Self-pay | Admitting: *Deleted

## 2011-02-03 NOTE — Telephone Encounter (Signed)
Pt's wife called stating pt's BP has been around 160/80. This morning it was 170/82. She states it is running a little on the high side. Dr. Myrtis Ser had taken off of Lisinopril and increased Cardizem to 240 mg. Pt's wife states pt denies any problems but is concerned about increased BP.

## 2011-02-03 NOTE — Telephone Encounter (Signed)
Left message to call back on voice mail

## 2011-02-03 NOTE — Telephone Encounter (Signed)
Restart lisinopril (in addition to cardizem) and let us know what his BP is.

## 2011-02-04 NOTE — Telephone Encounter (Signed)
Pt's wife notified. He will restart Lisinopril. Pt has OV next week. They will bring record of BP's at this time.

## 2011-02-12 ENCOUNTER — Encounter: Payer: Self-pay | Admitting: Cardiology

## 2011-02-12 ENCOUNTER — Ambulatory Visit (INDEPENDENT_AMBULATORY_CARE_PROVIDER_SITE_OTHER): Payer: Medicare Other | Admitting: Cardiology

## 2011-02-12 DIAGNOSIS — I4891 Unspecified atrial fibrillation: Secondary | ICD-10-CM

## 2011-02-12 DIAGNOSIS — I2510 Atherosclerotic heart disease of native coronary artery without angina pectoris: Secondary | ICD-10-CM

## 2011-02-12 NOTE — Patient Instructions (Signed)
Your physician wants you to follow-up in: 6 months. You will receive a reminder letter in the mail one-two months in advance. If you don't receive a letter, please call our office to schedule the follow-up appointment. Your physician recommends that you continue on your current medications as directed. Please refer to the Current Medication list given to you today. 

## 2011-02-12 NOTE — Assessment & Plan Note (Signed)
He had atrial fibrillation when having pain after redo of his hip surgery.  He is now back in sinus rhythm.  With this limited atrial fib once again we do not need to start other medications.  Also Coumadin is not to be used.  See him back in 6 months.

## 2011-02-12 NOTE — Assessment & Plan Note (Signed)
Coronary disease is stable. No change in therapy. 

## 2011-02-12 NOTE — Progress Notes (Signed)
HPI   Patient is seen for cardiology followup.  He is doing well.  I saw him last in April, 2012.  Since then he has been hospitalized again.  He had more problems with his hip.  He had atrial fib after his procedure.  He then reverted back to sinus rhythm.  He is in sinus rhythm again today. Allergies  Allergen Reactions  . Iodine     REACTION: rash  . Penicillins   . Sulfonamide Derivatives     Current Outpatient Prescriptions  Medication Sig Dispense Refill  . allopurinol (ZYLOPRIM) 100 MG tablet Take 100 mg by mouth daily.        Marland Kitchen alum & mag hydroxide-simeth (MYLANTA) 200-200-20 MG/5ML suspension Take by mouth as needed.        Marland Kitchen ascorbic acid (VITAMIN C) 500 MG tablet Take 500 mg by mouth 2 (two) times daily.        Marland Kitchen aspirin 325 MG tablet Take 325 mg by mouth daily.        . calcium-vitamin D (OSCAL WITH D) 500-200 MG-UNIT per tablet Take 1 tablet by mouth 2 (two) times daily.        . digoxin (LANOXIN) 0.125 MG tablet Take 125 mcg by mouth daily. HOLD       . diltiazem (CARTIA XT) 240 MG 24 hr capsule Take 1 capsule (240 mg total) by mouth daily.  30 capsule  6  . doxycycline (DORYX) 100 MG EC tablet Take 100 mg by mouth 2 (two) times daily.        . Ferrous Fumarate (IRON) 18 MG TBCR Take 1 tablet by mouth daily.        . finasteride (PROSCAR) 5 MG tablet Take 5 mg by mouth daily.        . furosemide (LASIX) 20 MG tablet Take 20 mg by mouth daily.        Marland Kitchen HYDROcodone-acetaminophen (NORCO) 7.5-325 MG per tablet Take 1-2 tablets by mouth every 4-6 hours as needed pain.       Marland Kitchen lisinopril (PRINIVIL,ZESTRIL) 10 MG tablet Take 10 mg by mouth 2 (two) times daily.       . methocarbamol (ROBAXIN) 500 MG tablet Take 500 mg by mouth every 6 (six) hours as needed.        . metoprolol (LOPRESSOR) 100 MG tablet Take 100 mg by mouth 2 (two) times daily.        . Multiple Vitamin (MULTIVITAMIN) tablet Take 1 tablet by mouth daily.        . nitroGLYCERIN (NITROSTAT) 0.4 MG SL tablet Place 0.4  mg under the tongue every 5 (five) minutes as needed.        . NON FORMULARY Take 18 mg by mouth daily. IRONSORB       . Omega-3 Fatty Acids (FISH OIL) 1000 MG CAPS Take 1 capsule by mouth 3 (three) times daily.        . pantoprazole (PROTONIX) 40 MG tablet Take 40 mg by mouth daily.        . potassium chloride SA (K-DUR,KLOR-CON) 20 MEQ tablet Take 20 mEq by mouth daily.        Marland Kitchen sulfamethoxazole-trimethoprim (BACTRIM DS,SEPTRA DS) 800-160 MG per tablet Take 1 tablet by mouth 2 (two) times daily.        Marland Kitchen terazosin (HYTRIN) 5 MG capsule Take 5 mg by mouth daily.          History   Social History  . Marital Status:  Married    Spouse Name: N/A    Number of Children: N/A  . Years of Education: N/A   Occupational History  . Paramedic     Retired  . Vet     Retired   Social History Main Topics  . Smoking status: Former Smoker -- 1.0 packs/day for 30 years    Types: Cigarettes    Quit date: 08/16/2006  . Smokeless tobacco: Never Used  . Alcohol Use: No  . Drug Use: Not on file  . Sexually Active: Not on file   Other Topics Concern  . Not on file   Social History Narrative   Lives with wife at home.    No family history on file.  Past Medical History  Diagnosis Date  . Atrial fibrillation     Postop hip surgery, January, 2012, digoxin and diltiazem, reverted to sinus in the hospital  . Renal insufficiency     Creatinine 2.0  ??  . Osteoarthritis   . COPD (chronic obstructive pulmonary disease)     Emphysema  . Depression   . Posttraumatic stress disorder   . GERD (gastroesophageal reflux disease)   . Irritable bowel   . BPH (benign prostatic hyperplasia)   . Urinary tract bacterial infections   . Osteoporosis   . Hypertension     EF 50-55%, echo, January, 2012  . Dyslipidemia   . CAD (coronary artery disease)     Stents placed in [place] and [place] in the past  /Myoview 2008, no ischemia, /   . Atrial flutter     History of the past with syncope and  ?? H.  and consideration, no records  . 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 Hip pain   . Shoulder pain   . Gout   . Peripheral arterial disease     Dopplers Dr.Vyas office October, 2010, apparent occlusion anterior tibial arteries bilaterally., findings suggest hemodynamically significant stenosis of the proximal right provisional femoral artery and the mid left superficial artery.  Ankle-brachial indices indicate moderate stenosis within both lower extremities  . Chronic kidney disease     Creatinine 2.1, October 13, 2010  . Hyperlipidemia     Mixed    Past Surgical History  Procedure Date  . Knee arthroplasty     Right  . Total knee arthroplasty     Left  . Total hip arthroplasty     Right-complicated hx  . Colon resection   . Back surgery     [place]  . Prostate surgery     ROS  Patient denies fever, chills, headache, sweats, rash, change in vision, change in hearing, chest pain, cough, nausea vomiting, urinary symptoms.  All other systems are reviewed and are negative.  PHYSICAL EXAM Patient is stable today.  He is here with his wife.  He is oriented to person time and place.  Affect is normal.  Head is atraumatic.  Lungs are clear.  Respiratory effort is nonlabored.  Cardiac exam reveals S1-S2.  No clicks or significant murmurs.  The abdomen is soft.  No peripheral edema. Filed Vitals:   02/12/11 1320  BP: 124/80  Pulse: 72  Height: 6\' 2"  (1.88 m)  Weight: 283 lb (128.368 kg)    EKG Is done today and reviewed by me.  He has sinus rhythm.  ASSESSMENT & PLAN

## 2011-05-06 ENCOUNTER — Ambulatory Visit (INDEPENDENT_AMBULATORY_CARE_PROVIDER_SITE_OTHER): Payer: Medicare Other | Admitting: Internal Medicine

## 2011-05-06 ENCOUNTER — Encounter (INDEPENDENT_AMBULATORY_CARE_PROVIDER_SITE_OTHER): Payer: Self-pay | Admitting: Internal Medicine

## 2011-05-06 VITALS — BP 130/78 | HR 96 | Temp 98.9°F | Ht 74.0 in | Wt 262.5 lb

## 2011-05-06 DIAGNOSIS — R197 Diarrhea, unspecified: Secondary | ICD-10-CM

## 2011-05-06 NOTE — Progress Notes (Signed)
Subjective:     Patient ID: Bryce Berry, male   DOB: 1941-04-21, 70 y.o.   MRN: [id number]  HPI  Bryce Berry is a 70 yr old male presenting today with bouts of diarrhea.   His diarrhea has been off and on. These symptoms have been going on for about for years since undergoing a colon resection for diverticulitis.  He alternates between diarrhea and constipation.  He says he has constipation about every week. prn  He also says he has swelling to his left upper quadrant for a couple of months.  He underwent a CT and was normal.  His appetite is good.  He has weight loss which is intentional.  He is having a BM  Every 3 days or daily.  This actually varies.   EGD 12/09/2010: Erosive reflux esophagitis. A large sliding hiatal hernia with multiple erosion single ulcer at the level of hiatus.  Suspect GES may be due to the fact that bolus has stayed in the herniated part of the stomach. GES: Markedly delayed gastric emptying with only 5% of trace from the stomach at 2 hours. His last colonoscopy was 09/22/2009: pancolonic diverticulosis. Wide open colonic anastomosis at 28cm from the anal margin.  Repeat in 5 yrs.   He gives a hx of diverticulitis and had 12 inches of his colon removed.  Review of Systems  See hpi Current Outpatient Prescriptions  Medication Sig Dispense Refill  . allopurinol (ZYLOPRIM) 100 MG tablet Take 100 mg by mouth daily.        Marland Kitchen alum & mag hydroxide-simeth (MYLANTA) 200-200-20 MG/5ML suspension Take by mouth as needed.        Marland Kitchen ascorbic acid (VITAMIN C) 500 MG tablet Take 500 mg by mouth daily.       Marland Kitchen aspirin 325 MG tablet Take 325 mg by mouth daily.        . calcium-vitamin D (OSCAL WITH D) 500-200 MG-UNIT per tablet Take 1 tablet by mouth daily.       Marland Kitchen diltiazem (CARTIA XT) 240 MG 24 hr capsule Take 1 capsule (240 mg total) by mouth daily.  30 capsule  6  . docusate sodium (COLACE) 100 MG capsule Take 100 mg by mouth 2 (two) times daily.        Marland Kitchen doxycycline (DORYX) 100 MG EC  tablet Take 100 mg by mouth 2 (two) times daily.        . Ferrous Fumarate (IRON) 18 MG TBCR Take 1 tablet by mouth daily.        . finasteride (PROSCAR) 5 MG tablet Take 5 mg by mouth daily.        . furosemide (LASIX) 20 MG tablet Take 20 mg by mouth daily.        Marland Kitchen HYDROcodone-acetaminophen (NORCO) 7.5-325 MG per tablet Take 1-2 tablets by mouth every 4-6 hours as needed pain.       . methocarbamol (ROBAXIN) 500 MG tablet Take 500 mg by mouth every 6 (six) hours as needed.        . metoprolol (LOPRESSOR) 100 MG tablet Take 100 mg by mouth 2 (two) times daily.        . Multiple Vitamin (MULTIVITAMIN) tablet Take 1 tablet by mouth daily.        . nitroGLYCERIN (NITROSTAT) 0.4 MG SL tablet Place 0.4 mg under the tongue every 5 (five) minutes as needed.        . NON FORMULARY Take 18 mg by mouth daily. IRONSORB       .  Omega-3 Fatty Acids (FISH OIL) 1000 MG CAPS Take 1 capsule by mouth daily.       . potassium chloride SA (K-DUR,KLOR-CON) 20 MEQ tablet Take 20 mEq by mouth daily.        . solifenacin (VESICARE) 10 MG tablet Take 5 mg by mouth daily.        Marland Kitchen terazosin (HYTRIN) 5 MG capsule Take 5 mg by mouth daily.        . pantoprazole (PROTONIX) 40 MG tablet Take 40 mg by mouth daily.         History   Social History  . Marital Status: Married    Spouse Name: N/A    Number of Children: N/A  . Years of Education: N/A   Occupational History  . Paramedic     Retired  . Vet     Retired   Social History Main Topics  . Smoking status: Never Smoker   . Smokeless tobacco: Never Used  . Alcohol Use: No  . Drug Use: No  . Sexually Active: Not on file   Other Topics Concern  . Not on file   Social History Narrative   Lives with wife at home.   No family status information on file.       Objective:   Physical Exam Filed Vitals:   05/06/11 1149  BP: 130/78  Pulse: 96  Temp: 98.9 F (37.2 C)  Height: 6\' 2"  (1.88 m)  Weight: 262 lb 8 oz (119.069 kg)    Alert and  oriented. Skin warm and dry.    Sclera anicteric, conjunctivae is pink. Thyroid not enlarged. No cervical lymphadenopathy. Lungs clear. Heart regular rate and rhythm.  Abdomen is soft. Bowel sounds are positive. No hepatomegaly. No abdominal masses felt.    I can appreciate any swelling to his left upper quadrant.No tenderness.  No edema to lower extremities. Patient is alert and oriented. He walks with a walker.     Assessment:    Diarrhea/constipation  Suspect this may be due to his previous colon surgery for diverticulitis. Plan:    Immodium prn.  Back off on the Colace to once a day. OV with Dr. Karilyn Cota in November.

## 2011-05-27 ENCOUNTER — Encounter (INDEPENDENT_AMBULATORY_CARE_PROVIDER_SITE_OTHER): Payer: Self-pay | Admitting: *Deleted

## 2011-06-21 ENCOUNTER — Other Ambulatory Visit: Payer: Self-pay | Admitting: Cardiology

## 2011-06-21 ENCOUNTER — Ambulatory Visit (INDEPENDENT_AMBULATORY_CARE_PROVIDER_SITE_OTHER): Payer: Medicare Other | Admitting: Internal Medicine

## 2011-07-13 ENCOUNTER — Ambulatory Visit (INDEPENDENT_AMBULATORY_CARE_PROVIDER_SITE_OTHER): Payer: Medicare Other | Admitting: Internal Medicine

## 2011-07-13 ENCOUNTER — Encounter (INDEPENDENT_AMBULATORY_CARE_PROVIDER_SITE_OTHER): Payer: Self-pay | Admitting: Internal Medicine

## 2011-07-13 DIAGNOSIS — K3184 Gastroparesis: Secondary | ICD-10-CM

## 2011-07-13 DIAGNOSIS — K5900 Constipation, unspecified: Secondary | ICD-10-CM | POA: Insufficient documentation

## 2011-07-13 DIAGNOSIS — R112 Nausea with vomiting, unspecified: Secondary | ICD-10-CM

## 2011-07-13 MED ORDER — METOCLOPRAMIDE HCL 10 MG PO TABS: 10.0000 mg | ORAL_TABLET | Freq: Two times a day (BID) | ORAL | Status: DC

## 2011-07-13 NOTE — Progress Notes (Signed)
Presenting complaint; Recurrent nausea, vomiting, LUQ abdominal pain and bloating. Subjective: Bryce Berry is 70 year old African American male who was initially evaluated in April this year for intermittent vomiting sour taste in his mouth and bloating. Gastric emptying study was very abnormal with 95% of activity in the stomach at 2 hours. EGD revealed erosive reflux esophagitis, large hiatal hernia with erosions involving the herniated part of the stomach and single ulcer at the level of hiatus. In addition to anti-reflex measures he was asked to continue his PPI and given 2 weeks of metoclopramide. He was asked to call us with a progress report which he never did. His wife states they just dropped the ball and did not followup. He is back to having his usual symptoms. He vomits at least once or twice a week. Last episode occurred soon after he finished Thanksgiving dinner and vomited every bit of it. He denies hematemesis or melena. He continues to complain of fullness in left upper quadrant pain he has some discomfort and he also complains of constipation with hard stools. His bloating is worse with meals. He has gained another 3 pounds despite his symptoms. He states he cannot do much walking or exercise on account of his arthritis and back pain. He is using cane to move around. He does not recall that he had any side effects of metoclopramide and believes he did feel better while he was on this medication. Current Medications Current Outpatient Prescriptions on File Prior to Visit  Medication Sig Dispense Refill  . allopurinol (ZYLOPRIM) 100 MG tablet Take 100 mg by mouth daily.        Marland Kitchen alum & mag hydroxide-simeth (MYLANTA) 200-200-20 MG/5ML suspension Take by mouth as needed.        Marland Kitchen ascorbic acid (VITAMIN C) 500 MG tablet Take 500 mg by mouth daily.       Marland Kitchen aspirin 325 MG tablet Take 325 mg by mouth daily.        . calcium-vitamin D (OSCAL WITH D) 500-200 MG-UNIT per tablet Take 1 tablet by  mouth daily.       Marland Kitchen diltiazem (CARDIZEM CD) 240 MG 24 hr capsule TAKE ONE CAPSULE BY MOUTH EVERY DAY  30 capsule  6  . docusate sodium (COLACE) 100 MG capsule Take 100 mg by mouth 2 (two) times daily.        Marland Kitchen doxycycline (DORYX) 100 MG EC tablet Take 100 mg by mouth 2 (two) times daily.        . Ferrous Fumarate (IRON) 18 MG TBCR Take 1 tablet by mouth daily.        . finasteride (PROSCAR) 5 MG tablet Take 5 mg by mouth daily.        . furosemide (LASIX) 20 MG tablet Take 20 mg by mouth daily.        Marland Kitchen HYDROcodone-acetaminophen (NORCO) 7.5-325 MG per tablet Take 1-2 tablets by mouth every 4-6 hours as needed pain.       . methocarbamol (ROBAXIN) 500 MG tablet Take 500 mg by mouth every 6 (six) hours as needed.        . metoprolol (LOPRESSOR) 100 MG tablet Take 100 mg by mouth 2 (two) times daily.        . Multiple Vitamin (MULTIVITAMIN) tablet Take 1 tablet by mouth daily.        . nitroGLYCERIN (NITROSTAT) 0.4 MG SL tablet Place 0.4 mg under the tongue every 5 (five) minutes as needed.        Marland Kitchen  Omega-3 Fatty Acids (FISH OIL) 1000 MG CAPS Take 1 capsule by mouth daily.       . pantoprazole (PROTONIX) 40 MG tablet Take 40 mg by mouth daily.        . potassium chloride SA (K-DUR,KLOR-CON) 20 MEQ tablet Take 20 mEq by mouth daily.        . solifenacin (VESICARE) 10 MG tablet Take 5 mg by mouth daily.        Marland Kitchen terazosin (HYTRIN) 5 MG capsule Take 5 mg by mouth daily.         Objective: BP 130/74  Pulse 78  Temp(Src) 98.8 F (37.1 C) (Oral)  Resp 16  Ht 6\' 2"  (1.88 m)  Wt 273 lb (123.832 kg)  BMI 35.05 kg/m2 Conjunctiva is pink.sclera is nonicteric. Oropharyngeal mucosa is normal. No neck masses or thyromegaly noted. Abdomen is protuberant slightly asymmetric with prominence under the left rib cage. Bowel sounds are normal. It is soft on palpation and mild tenderness below LCM but no hepatosplenomegaly or masses. He has trace edema around his ankles.  Assessment: #1. Nausea/vomiting.  These symptoms are secondary to gastroparesis  and large hiatal hernia. Symptoms not well controlled with anti-reflux therapy. If symptoms are primarily due to hiatal hernia weight loss would help but I'm not sure how  this would be accomplished given limited physical activity. Symptoms are primarily due to gastroparesis he needs  pro-motility agent. Choices are very limited. #2. Loading and LUQ abdominal pain. These symptoms most likely secondary to constipation and gastroparesis. He had abdominopelvic CT few months: Emergency room and this reportedly was normal.   Plan: 1. Patient will continue pantoprazole 40 mg daily but take it 30 minutes before breakfast. 2. Antireflux measures reinforced. 3. Patient advised to use glycerin or Dulcolax suppository every other day unless he has a spontaneous bowel movement. 4. Short-term therapy with metoclopramide 10 mg by mouth before breakfast and evening meal. Prescription given for 30 days. Patient advised to stop the medication if he has any side effects and also let us know. Will consider domperidone when and if it is available locally. 5. Office visit in 2 months.

## 2011-07-13 NOTE — Patient Instructions (Signed)
Continue anti-reflex measures as before. Take pantoprazole 40 mg by mouth 30-60 minutes before breakfast. Take metoclopramide 10 mg by mouth 30-60 minutes before breakfast and evening meal. Notify if you experience any side effects as discussed. Can use glycerin  or Dulcolax suppository every other day as needed. We will get a copy of your CT abdomen report from [facility] in go over the results with you.

## 2011-08-09 ENCOUNTER — Telehealth: Payer: Self-pay | Admitting: Cardiology

## 2011-08-09 NOTE — Telephone Encounter (Signed)
Left message for patient to cancel appointment on 12/26.  Advised in message we need to reschedule patient, patient should contact our office to reschedule.

## 2011-08-11 ENCOUNTER — Ambulatory Visit: Payer: Medicare Other | Admitting: Cardiology

## 2011-08-16 ENCOUNTER — Ambulatory Visit (INDEPENDENT_AMBULATORY_CARE_PROVIDER_SITE_OTHER): Payer: Medicare Other | Admitting: Cardiology

## 2011-08-16 ENCOUNTER — Encounter: Payer: Self-pay | Admitting: Cardiology

## 2011-08-16 VITALS — BP 117/82 | HR 61 | Ht 74.0 in | Wt 278.0 lb

## 2011-08-16 DIAGNOSIS — I4892 Unspecified atrial flutter: Secondary | ICD-10-CM

## 2011-08-16 MED ORDER — NITROGLYCERIN 0.4 MG SL SUBL: 0.4000 mg | SUBLINGUAL_TABLET | SUBLINGUAL | Status: DC | PRN

## 2011-08-16 MED ORDER — FENOFIBRATE 145 MG PO TABS: 145.0000 mg | ORAL_TABLET | Freq: Every day | ORAL | Status: AC

## 2011-08-16 NOTE — Patient Instructions (Signed)
Continue all current medications. Your physician wants you to follow up in: 6 months with Dr. Myrtis Ser.  You will receive a reminder letter in the mail one-two months in advance.  If you don't receive a letter, please call our office to schedule the follow up appointment

## 2011-08-17 NOTE — Progress Notes (Signed)
Bryce Bottoms, MD, [facility] ABIM Board Certified in Adult Cardiovascular Medicine,Internal Medicine and Critical Care Medicine    CC: routine follow up patient with a history of postoperative atrial fibrillation after hip surgery and asymptomatic peripheral arterial disease  HPI:  The patient is a 71 year old veteran of [place] war with posttraumatic stress syndrome.  The patient has been followed by Dr. Myrtis Ser for atrial fibrillation but is currently normal sinus rhythm.  He also has renal insufficiency.  He has known coronary artery disease and status post stent placement in 2008.  His appointment was rescheduled due to a full schedule of Dr. Myrtis Ser. The patient has been doing well from a cardiovascular perspective.  He denies any palpitations.  He reports no shortness of breath but is limited in his activity and walks often with a walker sometimes with a cane.  He has had hip surgery approximately a year ago and has not been fully ambulatory yet.  Sometimes he reports swelling in the left leg.  He does report also a small amount of generalized lower extremity edema.  He reports left foot pain and thinks this is gout clinically this does not have the appearance of gout.  Otherwise, cardiovascular standpoint the patient is stable.     PMH: reviewed and listed in Problem List in Electronic Records (and see below) Past Medical History  Diagnosis Date  . atrial fibrillation     Postop hip surgery, January, 2012, digoxin and diltiazem, reverted to sinus in the hospital  . Renal insufficiency     Creatinine 2.0  ??  . Osteoarthritis   . COPD (chronic obstructive pulmonary disease)     Emphysema  . Depression   . Posttraumatic stress disorder   . GERD (gastroesophageal reflux disease)   . Irritable bowel   . BPH (benign prostatic hyperplasia)   . Urinary tract bacterial infections   . Osteoporosis   . Hypertension     EF 50-55%, echo, January, 2012  . Dyslipidemia   . CAD (coronary artery  disease)     Stents placed in [place] and [place] in the past  /Myoview 2008, no ischemia, /   . Atrial flutter     History of the past with syncope and  ?? H. and consideration, no records  . 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 Hip pain   . Shoulder pain   . Gout   . Peripheral arterial disease     Dopplers Dr.Vyas office October, 2010, apparent occlusion anterior tibial arteries bilaterally., findings suggest hemodynamically significant stenosis of the proximal right provisional femoral artery and the mid left superficial artery.  Ankle-brachial indices indicate moderate stenosis within both lower extremities  . Chronic kidney disease     Creatinine 2.1, October 13, 2010  . Hyperlipidemia     Mixed      Allergies/SH/FHX : available in Electronic Records for review  Allergies  Allergen Reactions  . Iodine     REACTION: rash  . Penicillins   . Sulfonamide Derivatives    History   Social History  . Marital Status: Married    Spouse Name: N/A    Number of Children: N/A  . Years of Education: N/A   Occupational History  . Paramedic     Retired  . Vet     Retired   Social History Main Topics  . Smoking status: Former Smoker -- 1.0 packs/day for 30 years    Types: Cigarettes    Quit date:  08/16/2006  . Smokeless tobacco: Never Used   Comment: Patient smoked a pack a day for about 30 years  . Alcohol Use: No  . Drug Use: No  . Sexually Active: Not on file   Other Topics Concern  . Not on file   Social History Narrative   Lives with wife at home.   Family History  Problem Relation Age of Onset  . Diabetes Daughter   . Hypertension Daughter   . Obesity Daughter     Medications: Current Outpatient Prescriptions  Medication Sig Dispense Refill  . allopurinol (ZYLOPRIM) 100 MG tablet Take 200 mg by mouth daily.       Marland Kitchen alum & mag hydroxide-simeth (MYLANTA) 200-200-20 MG/5ML suspension Take by mouth as needed.        Marland Kitchen ascorbic acid (VITAMIN C)  500 MG tablet Take 500 mg by mouth daily.       Marland Kitchen aspirin 325 MG tablet Take 325 mg by mouth daily.        . calcium-vitamin D (OSCAL WITH D) 500-200 MG-UNIT per tablet Take 1 tablet by mouth daily.       . cilostazol (PLETAL) 100 MG tablet Take 100 mg by mouth daily.        . colchicine 0.6 MG tablet Take 0.6 mg by mouth daily.        Marland Kitchen diltiazem (CARDIZEM CD) 240 MG 24 hr capsule TAKE ONE CAPSULE BY MOUTH EVERY DAY  30 capsule  6  . docusate sodium (COLACE) 100 MG capsule Take 100 mg by mouth 2 (two) times daily.        Marland Kitchen doxycycline (DORYX) 100 MG EC tablet Take 100 mg by mouth 2 (two) times daily.        . fenofibrate (TRICOR) 145 MG tablet Take 1 tablet (145 mg total) by mouth daily.  30 tablet  6  . Ferrous Sulfate (RA IRON) 27 MG TABS Take 1 tablet by mouth daily.        . finasteride (PROSCAR) 5 MG tablet Take 5 mg by mouth daily.        . furosemide (LASIX) 20 MG tablet Take 20 mg by mouth daily.        Marland Kitchen HYDROcodone-acetaminophen (NORCO) 10-325 MG per tablet Take 1 tablet by mouth 2 (two) times daily as needed.        . methocarbamol (ROBAXIN) 500 MG tablet Take 500 mg by mouth 3 (three) times daily.       . metoCLOPramide (REGLAN) 10 MG tablet Take 10 mg by mouth 2 (two) times daily.        . metoprolol (LOPRESSOR) 100 MG tablet Take 100 mg by mouth 2 (two) times daily.        . Multiple Vitamin (MULTIVITAMIN) tablet Take 1 tablet by mouth daily.        . nitroGLYCERIN (NITROSTAT) 0.4 MG SL tablet Place 1 tablet (0.4 mg total) under the tongue every 5 (five) minutes as needed.  25 tablet  3  . Omega-3 Fatty Acids (FISH OIL) 1000 MG CAPS Take 2 capsules by mouth 3 (three) times daily.       . pantoprazole (PROTONIX) 40 MG tablet Take 40 mg by mouth daily.        . potassium chloride SA (K-DUR,KLOR-CON) 20 MEQ tablet Take 20 mEq by mouth daily.        . rosuvastatin (CRESTOR) 40 MG tablet Take 20 mg by mouth daily.        Marland Kitchen  solifenacin (VESICARE) 10 MG tablet Take 10 mg by mouth daily.        Marland Kitchen terazosin (HYTRIN) 5 MG capsule Take 5 mg by mouth daily.          ROS: No nausea or vomiting. No fever or chills.No melena or hematochezia.No bleeding.No claudication, the patient is limited in his activity  Physical Exam: BP 117/82  Pulse 61  Ht 6\' 2"  (1.88 m)  Wt 278 lb (126.1 kg)  BMI 35.69 kg/m2 General:overweight African American male in no distress Neck:short neck.  JVD is difficult to appreciate.  No thyromegaly non-nodular thyroid.  No definite carotid bruits. Lungs:clear breath sounds bilaterally without any wheezing Cardiac:regular rate and rhythm with normal S1 and S2 and I could not hear any pathological murmurs Vascular:1+ peripheral pitting edema diminished distal pulses dorsalis pedis and posterior tibial pulses Skin:warm and dry Physcologic:somewhat depressed affect  12lead ZOX:WRUEAV sinus rhythm with nonspecific ST-T wave changes Limited bedside ECHO:N/A   Assessment and Plan  Patient Active Problem List  Diagnoses  . HYPERLIPIDEMIA-MIXED  . HYPERTENSION, UNSPECIFIED  . CAD, NATIVE VESSEL  . ATRIAL FLUTTER  . PERIPHERAL VASCULAR DISEASE  . DEGENERATIVE JOINT DISEASE, HIPS  . SHOULDER PAIN  . HIP PAIN  . BACK PAIN  . IMPINGEMENT SYNDROME  . OTH COMPLICATIONS DUE INTERNAL JOINT PROSTHESIS  . RENAL INSUFFICIENCY  . ATRIAL FIBRILLATION, HX OF  . Atrial fibrillation  . Renal insufficiency  . COPD (chronic obstructive pulmonary disease)  . Posttraumatic stress disorder  . GERD (gastroesophageal reflux disease)  . Hypertension  . Dyslipidemia  . CAD (coronary artery disease)  . Atrial flutter  . Gout  . Peripheral arterial disease  . Chronic kidney disease  . Gastroparesis  . Constipation    Plan:   I suggested to the patient to consider sleep study.  I think he is at high risk for obstructive sleep apnea.  From the perspective of atrial fibrillation he is stable.  He denies any recurrent palpitations.  He's not on Coumadin as this was  a postoperative complication after hip surgery.  He has renal insufficiency and is followed by his primary care physician and nephrology.  I do not think repeat ABIs need to be considered anytime soon as the patient has no symptoms of claudication and is relatively limited in his physical activity.  I did explain however that he has poor healing wounds to consider repeat ABIs.  From coronary disease perspective the patient is stable and reports no recurrent chest pain.  He suggested continued risk factor modification and current drug therapy.

## 2011-08-25 ENCOUNTER — Ambulatory Visit: Payer: Medicare Other | Admitting: Cardiology

## 2011-08-30 ENCOUNTER — Other Ambulatory Visit (INDEPENDENT_AMBULATORY_CARE_PROVIDER_SITE_OTHER): Payer: Self-pay | Admitting: Internal Medicine

## 2011-09-13 ENCOUNTER — Ambulatory Visit (INDEPENDENT_AMBULATORY_CARE_PROVIDER_SITE_OTHER): Payer: Medicare Other | Admitting: Internal Medicine

## 2011-10-12 ENCOUNTER — Ambulatory Visit (INDEPENDENT_AMBULATORY_CARE_PROVIDER_SITE_OTHER): Payer: Medicare Other | Admitting: Internal Medicine

## 2011-10-12 ENCOUNTER — Other Ambulatory Visit: Payer: Self-pay | Admitting: Cardiology

## 2011-10-26 ENCOUNTER — Telehealth: Payer: Self-pay | Admitting: *Deleted

## 2011-10-26 NOTE — Telephone Encounter (Addendum)
Pt's wife states pt's HR has been running rapid. His HR seldom gets below 95. It is now running in the 95-125. He take diltiazem and metoprolol. Pt's wife states pt is not having any symptoms with this. Pt's wife also states pt has not had recent FLP/LFT labs done.

## 2011-10-27 NOTE — Telephone Encounter (Signed)
Recommend pt present for RN visit for VSS and EKG. He has h/o post op PAF, and may be back in A Fib.

## 2011-10-27 NOTE — Telephone Encounter (Signed)
Left message to call back on voice mail

## 2011-10-28 NOTE — Telephone Encounter (Signed)
Spoke with pt's wife who states pt started having rectal bleeding. She took him to [facility]. He has a h/o diverticulitis and they think this is what is going on. She will mention to Dr. Sherril Croon about his increased HR and have this evaluated while he is in the hospital.

## 2011-11-01 ENCOUNTER — Encounter (INDEPENDENT_AMBULATORY_CARE_PROVIDER_SITE_OTHER): Payer: Self-pay | Admitting: Internal Medicine

## 2011-11-01 ENCOUNTER — Ambulatory Visit (INDEPENDENT_AMBULATORY_CARE_PROVIDER_SITE_OTHER): Payer: Medicare Other | Admitting: Internal Medicine

## 2011-11-01 VITALS — BP 118/78 | HR 120 | Temp 98.3°F | Resp 22 | Ht 74.0 in | Wt 269.2 lb

## 2011-11-01 DIAGNOSIS — Z8719 Personal history of other diseases of the digestive system: Secondary | ICD-10-CM

## 2011-11-01 DIAGNOSIS — K3184 Gastroparesis: Secondary | ICD-10-CM

## 2011-11-01 DIAGNOSIS — D649 Anemia, unspecified: Secondary | ICD-10-CM

## 2011-11-01 DIAGNOSIS — K589 Irritable bowel syndrome without diarrhea: Secondary | ICD-10-CM

## 2011-11-01 MED ORDER — PSYLLIUM 28 % PO PACK: 1.0000 | PACK | Freq: Every day | ORAL | Status: DC

## 2011-11-01 MED ORDER — METOCLOPRAMIDE HCL 10 MG PO TABS: 10.0000 mg | ORAL_TABLET | Freq: Two times a day (BID) | ORAL | Status: DC

## 2011-11-01 NOTE — Patient Instructions (Addendum)
Notify if rectal bleeding recurs. Hemoglobin and hematocrit in 2 weeks. Metamucil 3-4 g by mouth daily. Skip morning dose of metoclopramide and just take evening dose before meal. Can go back on twice a day schedule  If vomiting recurs patient will call if he has another episode of rectal bleeding

## 2011-11-02 NOTE — Telephone Encounter (Addendum)
Pt's wife calls again regarding increased HR. She states when he was in the hospital the nurses told him that HR of 120s wasn't that high and increased HR wasn't evaluated at that time. Pt's wife remains concerned regarding increased HR and would like this evaluated. She states pt is still taking Cardizem and Metoprolol as prescribed.   BP has been running a little on the high side. Pt is not taking Lisinopril at this time but Dr. Sherril Croon told them to resume Lisinopril 10 mg daily if BP gets high. Her main concern is his HR. Pt scheduled with Gene for April 18th which is first available for Gene or Dr. Myrtis Ser. She is aware note will be sent to PA to decide if any changes should be made before this appt. It does not appear EKG was done during recent hospitalization.

## 2011-11-08 NOTE — Progress Notes (Signed)
Presenting complaint;  Rectal bleeding. Subjective:  Bryce Berry a 71 year old Afro-American male was here for scheduled visit accompanied by his wife. I last saw on 1 07/13/2011 for nausea vomiting secondary to large hiatal hernia and gastroparesis. He now comes in with complaints of rectal bleeding. He was admitted to [facility] on 10/27/2011 following an episode of large-volume hematochezia. He had multiple bloody bowel movements. In the hospital he was treated with IV antibiotic he did have a CT but no procedure was performed. Since he left the hospital he has not passed any more blood per rectum. He rarely vomits. His heartburn is well controlled with therapy. He is not experiencing any side effects with metoclopramide. He underwent sigmoid resection in 1992 for sigmoid diverticulitis. He had 4 on a polyps removed in 2008. Last  colonoscopy was in February 2000 03/04/2010. He remains with good appetite. His wife has noted his heart rate to be fast and he has an appointment with his cardiologist.  Current Medications: Current Outpatient Prescriptions  Medication Sig Dispense Refill  . allopurinol (ZYLOPRIM) 100 MG tablet Take 200 mg by mouth daily.       Marland Kitchen alum & mag hydroxide-simeth (MYLANTA) 200-200-20 MG/5ML suspension Take by mouth as needed.        Marland Kitchen ascorbic acid (VITAMIN C) 500 MG tablet Take 500 mg by mouth daily.       Marland Kitchen aspirin 325 MG tablet Take 325 mg by mouth daily.        . calcium-vitamin D (OSCAL WITH D) 500-200 MG-UNIT per tablet Take 1 tablet by mouth daily.       . cilostazol (PLETAL) 100 MG tablet Take 100 mg by mouth daily.        . colchicine 0.6 MG tablet Take 0.6 mg by mouth daily.        Marland Kitchen diltiazem (CARDIZEM CD) 240 MG 24 hr capsule TAKE ONE CAPSULE BY MOUTH EVERY DAY  30 capsule  6  . docusate sodium (COLACE) 100 MG capsule Take 100 mg by mouth 2 (two) times daily.        Marland Kitchen doxycycline (DORYX) 100 MG EC tablet Take 100 mg by mouth 2 (two) times daily.        . fenofibrate  (TRICOR) 145 MG tablet Take 1 tablet (145 mg total) by mouth daily.  30 tablet  6  . Ferrous Sulfate (RA IRON) 27 MG TABS Take 1 tablet by mouth daily.        . finasteride (PROSCAR) 5 MG tablet Take 5 mg by mouth daily.        . furosemide (LASIX) 20 MG tablet Take 20 mg by mouth daily.        Marland Kitchen HYDROcodone-acetaminophen (NORCO) 10-325 MG per tablet Take 1 tablet by mouth 2 (two) times daily as needed.        . metoCLOPramide (REGLAN) 10 MG tablet Take 1 tablet (10 mg total) by mouth 2 (two) times daily before a meal.  60 tablet  3  . metoprolol (LOPRESSOR) 100 MG tablet Take 100 mg by mouth 2 (two) times daily.        . Multiple Vitamin (MULTIVITAMIN) tablet Take 1 tablet by mouth daily.        . nitroGLYCERIN (NITROSTAT) 0.4 MG SL tablet Place 1 tablet (0.4 mg total) under the tongue every 5 (five) minutes as needed.  25 tablet  3  . Omega-3 Fatty Acids (FISH OIL) 1000 MG CAPS Take 2 capsules by mouth 3 (three) times  daily.       . pantoprazole (PROTONIX) 40 MG tablet Take 40 mg by mouth daily.        . potassium chloride SA (K-DUR,KLOR-CON) 20 MEQ tablet Take 20 mEq by mouth daily.        . rosuvastatin (CRESTOR) 40 MG tablet Take 20 mg by mouth daily.        Marland Kitchen terazosin (HYTRIN) 5 MG capsule Take 5 mg by mouth daily.        . psyllium (METAMUCIL SMOOTH TEXTURE) 28 % packet Take 1 packet by mouth at bedtime.  30 packet  11     Objective: Blood pressure 118/78, pulse 120, temperature 98.3 F (36.8 C), temperature source Oral, resp. rate 22, height 6\' 2"  (1.88 m), weight 269 lb 3.2 oz (122.108 kg). Patient is in no acute distress Conjunctiva is pink. Sclera is nonicteric Oropharyngeal mucosa is normal. No neck masses or thyromegaly noted. Cardiac exam with irregular rhythm normal S1 and S2. No murmur or gallop noted. Lungs are clear to auscultation. Abdomen is obese but soft and nontender without organomegaly or masses.  He has trace edema around his ankles.    Assessment:  #1.  Recent episode of rectal bleeding requiring brief hospitalization. He did not require blood transfusion. Suspect he bled from colonic diverticulosis. Do not feel there is indication or need for colonoscopy unless this was to recur. Last colonoscopy was in February 2011. He also has IBS. #2. Recurrent nausea and vomiting secondary to large hiatal hernia and gastroparesis. He is tolerating low-dose metoclopramide.   Plan:  Patient will call if he has another episode or rectal bleeding. H&H in 2 weeks. Metamucil 3-4 g daily. He will try metoclopramide 10 mg a day before his evening meal. If he experiences emesis in the a.m. he can go back to twice a day schedule. Office visit in 4 months.

## 2011-12-02 ENCOUNTER — Encounter: Payer: Self-pay | Admitting: Physician Assistant

## 2011-12-02 ENCOUNTER — Ambulatory Visit (INDEPENDENT_AMBULATORY_CARE_PROVIDER_SITE_OTHER): Payer: Medicare Other | Admitting: Physician Assistant

## 2011-12-02 VITALS — BP 95/69 | HR 90 | Ht 74.0 in | Wt 275.2 lb

## 2011-12-02 DIAGNOSIS — Z79899 Other long term (current) drug therapy: Secondary | ICD-10-CM

## 2011-12-02 DIAGNOSIS — R002 Palpitations: Secondary | ICD-10-CM

## 2011-12-02 DIAGNOSIS — E785 Hyperlipidemia, unspecified: Secondary | ICD-10-CM

## 2011-12-02 DIAGNOSIS — E782 Mixed hyperlipidemia: Secondary | ICD-10-CM

## 2011-12-02 DIAGNOSIS — I2510 Atherosclerotic heart disease of native coronary artery without angina pectoris: Secondary | ICD-10-CM

## 2011-12-02 DIAGNOSIS — N289 Disorder of kidney and ureter, unspecified: Secondary | ICD-10-CM

## 2011-12-02 DIAGNOSIS — R0602 Shortness of breath: Secondary | ICD-10-CM

## 2011-12-02 DIAGNOSIS — Z8679 Personal history of other diseases of the circulatory system: Secondary | ICD-10-CM

## 2011-12-02 MED ORDER — ASPIRIN EC 81 MG PO TBEC: 81.0000 mg | DELAYED_RELEASE_TABLET | Freq: Every day | ORAL | Status: AC

## 2011-12-02 NOTE — Patient Instructions (Addendum)
Follow up in 2 months. Decrease Aspirin to 81 mg daily. Your physician has requested that you have an echocardiogram. Echocardiography is a painless test that uses sound waves to create images of your heart. It provides your doctor with information about the size and shape of your heart and how well your heart's chambers and valves are working. This procedure takes approximately one hour. There are no restrictions for this procedure. Your physician recommends that you go to the [facility] for a FASTING lipid profile and liver function labs. Do not eat or drink after midnight. We will also check your thyroid hormone level. If the results of your test are normal or stable, you will receive a letter. If they are abnormal, the nurse will contact you by phone.

## 2011-12-02 NOTE — Assessment & Plan Note (Signed)
Mild renal sufficiency noted during recent brief hospitalization, with creatinine 1.3 initially, and 1.0 by time of discharge.

## 2011-12-02 NOTE — Assessment & Plan Note (Signed)
Will reassess lipid status with a FLP/LFT profile. Aggressive management recommended with target LDL 70 or less, if feasible. 

## 2011-12-02 NOTE — Assessment & Plan Note (Signed)
No current evidence of recurrent atrial fibrillation/flutter, by EKG. Patient denied any tachycardia palpitations, during recent period of persistent tachycardia. We'll continue to monitor closely. Recent hospital labs were reviewed, and notable for stable anemia with hemoglobin in 10.0 range. Will check TSH level. Continue current dose of Cardizem and Lopressor for rate control.

## 2011-12-02 NOTE — Progress Notes (Signed)
HPI: Patient presents for evaluation of tachycardia palpitations. He was last seen here in clinic in December, by Dr. Andee Lineman, for scheduled followup. No medication adjustments were made.  The patient's wife called our office one month ago, concerned that the patient had developed persistent tachycardia. She noted pulse readings in the 95-120 range, which persisted for approximately one month. Interestingly, however, the patient denied any associated symptoms, during this time frame. He does cite one singular episode of chest pain, while lying supine, for which he took 1 NTG tablet, with apparent relief. He has not had any recurrent episodes. He denies any exertional CP, when ambulating with his rolling walker. He is on a PPI, but denies any symptoms suggestive of active reflux disease. Of note, he does suggest some worsening of his baseline DOE.  Regarding the tachycardia palpitations, the patient states that he was unaware that his heart rate was elevated. His wife states that this has since normalized. Of note, he was briefly hospitalized here at [facility] in late March, with acute LGIB, secondary to possible diverticulitis. Hemoglobin was 9.7. Stool samples for occult blood were not obtained.  EKG today indicates NSR at 86 bpm, with no ischemic changes.  Allergies  Allergen Reactions  . Iodine     REACTION: rash  . Penicillins   . Sulfonamide Derivatives     Current Outpatient Prescriptions  Medication Sig Dispense Refill  . allopurinol (ZYLOPRIM) 100 MG tablet Take 200 mg by mouth daily.       Marland Kitchen alum & mag hydroxide-simeth (MYLANTA) 200-200-20 MG/5ML suspension Take by mouth as needed.        Marland Kitchen ascorbic acid (VITAMIN C) 500 MG tablet Take 500 mg by mouth daily.       Marland Kitchen aspirin 325 MG tablet Take 325 mg by mouth daily.        . calcium-vitamin D (OSCAL WITH D) 500-200 MG-UNIT per tablet Take 1 tablet by mouth daily.       . cilostazol (PLETAL) 100 MG tablet Take 100 mg by mouth daily.          . colchicine 0.6 MG tablet Take 0.6 mg by mouth daily as needed.       . diltiazem (CARDIZEM CD) 240 MG 24 hr capsule TAKE ONE CAPSULE BY MOUTH EVERY DAY  30 capsule  6  . docusate sodium (COLACE) 100 MG capsule Take 100 mg by mouth 2 (two) times daily.        Marland Kitchen doxycycline (DORYX) 100 MG EC tablet Take 100 mg by mouth 2 (two) times daily.        . fenofibrate (TRICOR) 145 MG tablet Take 1 tablet (145 mg total) by mouth daily.  30 tablet  6  . Ferrous Sulfate (RA IRON) 27 MG TABS Take 1 tablet by mouth daily.        . finasteride (PROSCAR) 5 MG tablet Take 5 mg by mouth daily.        . furosemide (LASIX) 20 MG tablet Take 20 mg by mouth daily.        Marland Kitchen HYDROcodone-acetaminophen (NORCO) 10-325 MG per tablet Take 1 tablet by mouth 2 (two) times daily as needed.        . metoCLOPramide (REGLAN) 10 MG tablet Take 10 mg by mouth daily.      . metoprolol (LOPRESSOR) 100 MG tablet Take 100 mg by mouth 2 (two) times daily.        . Multiple Vitamin (MULTIVITAMIN) tablet Take 1 tablet by  mouth daily.        . nitroGLYCERIN (NITROSTAT) 0.4 MG SL tablet Place 1 tablet (0.4 mg total) under the tongue every 5 (five) minutes as needed.  25 tablet  3  . Omega-3 Fatty Acids (FISH OIL) 1000 MG CAPS Take 1 capsule by mouth 2 (two) times daily.       . pantoprazole (PROTONIX) 40 MG tablet Take 40 mg by mouth daily.        . potassium chloride SA (K-DUR,KLOR-CON) 20 MEQ tablet Take 20 mEq by mouth daily.        . psyllium (METAMUCIL SMOOTH TEXTURE) 28 % packet Take 1 packet by mouth at bedtime.  30 packet  11  . rosuvastatin (CRESTOR) 40 MG tablet Take 20 mg by mouth daily.        Marland Kitchen terazosin (HYTRIN) 5 MG capsule Take 5 mg by mouth daily.        Marland Kitchen DISCONTD: metoCLOPramide (REGLAN) 10 MG tablet Take 1 tablet (10 mg total) by mouth 2 (two) times daily before a meal.  60 tablet  3  . DISCONTD: solifenacin (VESICARE) 10 MG tablet Take 10 mg by mouth daily.         Past Medical History  Diagnosis Date  . Atrial  fibrillation     Postop hip surgery, January, 2012, digoxin and diltiazem, reverted to sinus in the hospital  . Renal insufficiency     Creatinine 2.0  ??  . Osteoarthritis   . COPD (chronic obstructive pulmonary disease)     Emphysema  . Depression   . Posttraumatic stress disorder   . GERD (gastroesophageal reflux disease)   . Irritable bowel   . BPH (benign prostatic hyperplasia)   . Urinary tract bacterial infections   . Osteoporosis   . Hypertension     EF 50-55%, echo, January, 2012  . Dyslipidemia   . CAD (coronary artery disease)     Stents placed in [place] and [place] in the past  /Myoview 2008, no ischemia, /   . Atrial flutter     History of the past with syncope and  ?? H. and consideration, no records  . 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 Hip pain   . Shoulder pain   . Gout   . Peripheral arterial disease     Dopplers Dr.Vyas office October, 2010, apparent occlusion anterior tibial arteries bilaterally., findings suggest hemodynamically significant stenosis of the proximal right provisional femoral artery and the mid left superficial artery.  Ankle-brachial indices indicate moderate stenosis within both lower extremities  . Chronic kidney disease     Creatinine 2.1, October 13, 2010  . Hyperlipidemia     Mixed    Past Surgical History  Procedure Date  . Knee arthroplasty     Right  . Total knee arthroplasty     Left  . Total hip arthroplasty     Right-complicated hx  . Colon resection   . Back surgery     [place]  . Prostate surgery     History   Social History  . Marital Status: Married    Spouse Name: N/A    Number of Children: N/A  . Years of Education: N/A   Occupational History  . Paramedic     Retired  . Vet     Retired   Social History Main Topics  . Smoking status: Former Smoker -- 1.0 packs/day for 30 years    Types: Cigarettes  Quit date: 08/16/2006  . Smokeless tobacco: Never Used   Comment: Patient  smoked a pack a day for about 30 years  . Alcohol Use: No  . Drug Use: No  . Sexually Active: Not on file   Other Topics Concern  . Not on file   Social History Narrative   Lives with wife at home.    Family History  Problem Relation Age of Onset  . Diabetes Daughter   . Hypertension Daughter   . Obesity Daughter     ROS: no nausea, vomiting; no fever, chills; no melena, hematochezia; no claudication  PHYSICAL EXAM: BP 95/69  Pulse 90  Ht 6\' 2"  (1.88 m)  Wt 275 lb 4 oz (124.853 kg)  BMI 35.34 kg/m2 GENERAL: 71 year old male, obese; NAD HEENT: NCAT, PERRLA, EOMI; sclera clear; no xanthelasma NECK: palpable bilateral carotid pulses, no bruits; no JVD; no TM LUNGS: CTA bilaterally CARDIAC: RRR (S1, S2); no significant murmurs; no rubs or gallops ABDOMEN: Protuberant EXTREMETIES: no significant peripheral edema SKIN: warm/dry; no obvious rash/lesions MUSCULOSKELETAL: no joint deformity NEURO: no focal deficit; NL affect   EKG: reviewed and available in Electronic Records   ASSESSMENT & PLAN:

## 2011-12-02 NOTE — Assessment & Plan Note (Signed)
Stable on current medication regimen. Will decrease aspirin to 81 mg daily, in light of chronic anemia and recent LGIB. We'll reassess LVF with a 2-D echocardiogram, with further recommendations to follow. Of note, patient has not had an ischemic evaluation since 2008, when he had a negative Myoview. He had normal LVF by echocardiogram, 1/12.

## 2011-12-22 ENCOUNTER — Other Ambulatory Visit: Payer: Self-pay

## 2011-12-22 ENCOUNTER — Other Ambulatory Visit (INDEPENDENT_AMBULATORY_CARE_PROVIDER_SITE_OTHER): Payer: Medicare Other

## 2011-12-22 DIAGNOSIS — R002 Palpitations: Secondary | ICD-10-CM

## 2011-12-22 DIAGNOSIS — I2510 Atherosclerotic heart disease of native coronary artery without angina pectoris: Secondary | ICD-10-CM

## 2011-12-22 DIAGNOSIS — R0602 Shortness of breath: Secondary | ICD-10-CM

## 2011-12-23 ENCOUNTER — Encounter: Payer: Self-pay | Admitting: *Deleted

## 2012-01-03 ENCOUNTER — Telehealth: Payer: Self-pay | Admitting: *Deleted

## 2012-01-03 MED ORDER — ROSUVASTATIN CALCIUM 40 MG PO TABS: 40.0000 mg | ORAL_TABLET | Freq: Every day | ORAL | Status: DC

## 2012-01-03 NOTE — Telephone Encounter (Signed)
Message copied by Murriel Hopper on Mon Jan 03, 2012 12:23 PM ------      Message from: Prescott Parma C      Created: Wed Dec 22, 2011 12:00 PM       NL TSH. LDL 82, increase Crestor to 40 daily and repeat FLP/LFTs in 12 weeks.

## 2012-01-03 NOTE — Telephone Encounter (Signed)
Notes Recorded by Lesle Chris, LPN on 1/61/0960 at 11:14 AM Patient notified and verbalized understanding. Will send new rx to [facility]

## 2012-01-06 ENCOUNTER — Telehealth: Payer: Self-pay | Admitting: *Deleted

## 2012-01-06 NOTE — Telephone Encounter (Signed)
Left message for patient to call office r/e questions relating to prior authorization for crestor.

## 2012-01-06 NOTE — Telephone Encounter (Signed)
Spoke with wife and she said patient has taken vytorin before that was ineffective.

## 2012-02-07 ENCOUNTER — Encounter: Payer: Self-pay | Admitting: Physician Assistant

## 2012-02-07 ENCOUNTER — Ambulatory Visit (INDEPENDENT_AMBULATORY_CARE_PROVIDER_SITE_OTHER): Payer: Medicare Other | Admitting: Physician Assistant

## 2012-02-07 ENCOUNTER — Encounter: Payer: Self-pay | Admitting: *Deleted

## 2012-02-07 VITALS — BP 140/83 | HR 80 | Ht 74.0 in | Wt 295.4 lb

## 2012-02-07 DIAGNOSIS — R0989 Other specified symptoms and signs involving the circulatory and respiratory systems: Secondary | ICD-10-CM

## 2012-02-07 DIAGNOSIS — I2510 Atherosclerotic heart disease of native coronary artery without angina pectoris: Secondary | ICD-10-CM

## 2012-02-07 DIAGNOSIS — R002 Palpitations: Secondary | ICD-10-CM

## 2012-02-07 DIAGNOSIS — E785 Hyperlipidemia, unspecified: Secondary | ICD-10-CM

## 2012-02-07 MED ORDER — DILTIAZEM HCL ER COATED BEADS 240 MG PO CP24: 240.0000 mg | ORAL_CAPSULE | Freq: Every day | ORAL | Status: DC

## 2012-02-07 MED ORDER — NITROGLYCERIN 0.4 MG SL SUBL: 0.4000 mg | SUBLINGUAL_TABLET | SUBLINGUAL | Status: DC | PRN

## 2012-02-07 MED ORDER — ROSUVASTATIN CALCIUM 40 MG PO TABS: 40.0000 mg | ORAL_TABLET | Freq: Every evening | ORAL | Status: DC

## 2012-02-07 MED ORDER — METOPROLOL TARTRATE 100 MG PO TABS: 100.0000 mg | ORAL_TABLET | Freq: Two times a day (BID) | ORAL | Status: DC

## 2012-02-07 MED ORDER — POTASSIUM CHLORIDE CRYS ER 20 MEQ PO TBCR: 20.0000 meq | EXTENDED_RELEASE_TABLET | Freq: Every day | ORAL | Status: DC

## 2012-02-07 MED ORDER — FUROSEMIDE 20 MG PO TABS: 20.0000 mg | ORAL_TABLET | Freq: Every day | ORAL | Status: DC

## 2012-02-07 NOTE — Progress Notes (Signed)
HPI: Patient presents for scheduled followup. When last seen, I ordered a followup 2-D echo, and this indicated EF 50-55%, severe LVH, and severe inferior HK (grade 1 diastolic dysfunction). LVF unchanged since 2010. I also reassess lipid status, and LDL was 82, on current therapy.  Patient denies any interim development of exertional CP, tachycardia palpitations. He has mild, stable DOE, but no orthopnea, PND, or LE edema.   Allergies  Allergen Reactions  . Iodine     REACTION: rash  . Penicillins   . Sulfonamide Derivatives     Current Outpatient Prescriptions  Medication Sig Dispense Refill  . allopurinol (ZYLOPRIM) 100 MG tablet Take 200 mg by mouth daily.       Marland Kitchen alum & mag hydroxide-simeth (MYLANTA) 200-200-20 MG/5ML suspension Take by mouth as needed.        Marland Kitchen ascorbic acid (VITAMIN C) 500 MG tablet Take 500 mg by mouth daily.       Marland Kitchen aspirin EC 81 MG tablet Take 1 tablet (81 mg total) by mouth daily.      . calcium-vitamin D (OSCAL WITH D) 500-200 MG-UNIT per tablet Take 1 tablet by mouth daily.       . cilostazol (PLETAL) 100 MG tablet Take 100 mg by mouth daily.        . colchicine 0.6 MG tablet Take 0.6 mg by mouth daily as needed.       . diltiazem (CARDIZEM CD) 240 MG 24 hr capsule TAKE ONE CAPSULE BY MOUTH EVERY DAY  30 capsule  6  . docusate sodium (COLACE) 100 MG capsule Take 100 mg by mouth 2 (two) times daily.        Marland Kitchen doxycycline (DORYX) 100 MG EC tablet Take 100 mg by mouth 2 (two) times daily.        . fenofibrate (TRICOR) 145 MG tablet Take 1 tablet (145 mg total) by mouth daily.  30 tablet  6  . Ferrous Sulfate (RA IRON) 27 MG TABS Take 1 tablet by mouth daily.        . finasteride (PROSCAR) 5 MG tablet Take 5 mg by mouth daily.        . furosemide (LASIX) 20 MG tablet Take 20 mg by mouth daily.        Marland Kitchen HYDROcodone-acetaminophen (NORCO) 10-325 MG per tablet Take 1 tablet by mouth 2 (two) times daily as needed.        . metoCLOPramide (REGLAN) 10 MG tablet Take 10  mg by mouth daily.      . metoprolol (LOPRESSOR) 100 MG tablet Take 100 mg by mouth 2 (two) times daily.        . Multiple Vitamin (MULTIVITAMIN) tablet Take 1 tablet by mouth daily.        . nitroGLYCERIN (NITROSTAT) 0.4 MG SL tablet Place 1 tablet (0.4 mg total) under the tongue every 5 (five) minutes as needed.  25 tablet  3  . Omega-3 Fatty Acids (FISH OIL) 1000 MG CAPS Take 1 capsule by mouth 2 (two) times daily.       . pantoprazole (PROTONIX) 40 MG tablet Take 40 mg by mouth daily.        . potassium chloride SA (K-DUR,KLOR-CON) 20 MEQ tablet Take 20 mEq by mouth daily.        . psyllium (METAMUCIL SMOOTH TEXTURE) 28 % packet Take 1 packet by mouth at bedtime.  30 packet  11  . rosuvastatin (CRESTOR) 40 MG tablet Take 1 tablet (40 mg total)  by mouth daily.  30 tablet  6  . terazosin (HYTRIN) 5 MG capsule Take 5 mg by mouth daily.        Marland Kitchen DISCONTD: solifenacin (VESICARE) 10 MG tablet Take 10 mg by mouth daily.         Past Medical History  Diagnosis Date  . Atrial fibrillation     Postop hip surgery, January, 2012, digoxin and diltiazem, reverted to sinus in the hospital  . Renal insufficiency     Creatinine 2.0  ??  . Osteoarthritis   . COPD (chronic obstructive pulmonary disease)     Emphysema  . Depression   . Posttraumatic stress disorder   . GERD (gastroesophageal reflux disease)   . Irritable bowel   . BPH (benign prostatic hyperplasia)   . Urinary tract bacterial infections   . Osteoporosis   . Hypertension     EF 50-55%, echo, January, 2012  . Dyslipidemia   . CAD (coronary artery disease)     Stents placed in [place] and [place] in the past  /Myoview 2008, no ischemia, /   . Atrial flutter     History of the past with syncope and  ?? H. and consideration, no records  . 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 Hip pain   . Shoulder pain   . Gout   . Peripheral arterial disease     Dopplers Dr.Vyas office October, 2010, apparent occlusion anterior tibial  arteries bilaterally., findings suggest hemodynamically significant stenosis of the proximal right provisional femoral artery and the mid left superficial artery.  Ankle-brachial indices indicate moderate stenosis within both lower extremities  . Chronic kidney disease     Creatinine 2.1, October 13, 2010  . Hyperlipidemia     Mixed    Past Surgical History  Procedure Date  . Knee arthroplasty     Right  . Total knee arthroplasty     Left  . Total hip arthroplasty     Right-complicated hx  . Colon resection   . Back surgery     [place]  . Prostate surgery     History   Social History  . Marital Status: Married    Spouse Name: N/A    Number of Children: N/A  . Years of Education: N/A   Occupational History  . Paramedic     Retired  . Vet     Retired   Social History Main Topics  . Smoking status: Former Smoker -- 1.0 packs/day for 30 years    Types: Cigarettes    Quit date: 08/16/2006  . Smokeless tobacco: Never Used   Comment: Patient smoked a pack a day for about 30 years  . Alcohol Use: No  . Drug Use: No  . Sexually Active: Not on file   Other Topics Concern  . Not on file   Social History Narrative   Lives with wife at home.    Family History  Problem Relation Age of Onset  . Diabetes Daughter   . Hypertension Daughter   . Obesity Daughter     ROS: no nausea, vomiting; no fever, chills; no melena, hematochezia; no claudication  PHYSICAL EXAM: BP 140/83  Pulse 80  Ht 6\' 2"  (1.88 m)  Wt 295 lb 6.4 oz (133.993 kg)  BMI 37.93 kg/m2 GENERAL: 71 year old male, obese; NAD  HEENT: NCAT, PERRLA, EOMI; sclera clear; no xanthelasma  NECK: palpable bilateral carotid pulses, no bruits; no JVD; no TM  LUNGS: CTA bilaterally  CARDIAC: RRR (S1, S2); no significant murmurs; no rubs or gallops  ABDOMEN: Protuberant  EXTREMETIES: no significant peripheral edema  SKIN: warm/dry; no obvious rash/lesions  MUSCULOSKELETAL: no joint deformity    NEURO: no focal deficit; NL affect    EKG:    ASSESSMENT & PLAN:  CAD, NATIVE VESSEL Recommend a Lexiscan Myoview to rule out occult ischemia. Patient has not had an ischemic evaluation since 2008, at which time Myoview was negative. Recent echo indicated preserved LVF (EF 50-55%), with severe LVH. If study is negative for ischemia, then patient can return for routine followup in 6 months, with Dr. Andee Lineman. However, if there is evidence of ischemia, we will need to consider more aggressive evaluation with cardiac catheterization.  HYPERLIPIDEMIA-MIXED Will reassess lipid status with a FLP/LFT profile in 2 months, following recent increase in Crestor to maximum dose. This was following LDL of 82. Recommended target LDL 72 or less, if feasible.  ATRIAL FIBRILLATION, HX OF Quiescent by history and exam. Patient in NSR during most recent OV. Continue low-dose aspirin, with history of chronic anemia and recent acute LGIB.    Gene Annaliah Rivenbark, PAC

## 2012-02-07 NOTE — Assessment & Plan Note (Signed)
Quiescent by history and exam. Patient in NSR during most recent OV. Continue low-dose aspirin, with history of chronic anemia and recent acute LGIB.

## 2012-02-07 NOTE — Patient Instructions (Addendum)
   Lexiscan Myoview (stress test)  Labs:  Fasting lipid and liver panel in 2 months  Office will contact with results. Your physician wants you to follow up in: 6 months.  You will receive a reminder letter in the mail one-two months in advance.  If you don't receive a letter, please call our office to schedule the follow up appointment

## 2012-02-07 NOTE — Assessment & Plan Note (Signed)
Recommend a Lexiscan Myoview to rule out occult ischemia. Patient has not had an ischemic evaluation since 2008, at which time Myoview was negative. Recent echo indicated preserved LVF (EF 50-55%), with severe LVH. If study is negative for ischemia, then patient can return for routine followup in 6 months, with Dr. Andee Lineman. However, if there is evidence of ischemia, we will need to consider more aggressive evaluation with cardiac catheterization.

## 2012-02-07 NOTE — Assessment & Plan Note (Addendum)
Will reassess lipid status with a FLP/LFT profile in 2 months, following recent increase in Crestor to maximum dose. This was following LDL of 82. Recommended target LDL 72 or less, if feasible.

## 2012-02-10 ENCOUNTER — Other Ambulatory Visit: Payer: Self-pay | Admitting: Physician Assistant

## 2012-02-10 ENCOUNTER — Telehealth: Payer: Self-pay

## 2012-02-10 NOTE — Telephone Encounter (Signed)
Tues July 2 @ 7:30 Rest Lexiscan Myoview at [facility]

## 2012-02-10 NOTE — Telephone Encounter (Signed)
Pt has Medicare and T[employer]are.  No precert required °

## 2012-02-15 DIAGNOSIS — R079 Chest pain, unspecified: Secondary | ICD-10-CM

## 2012-02-23 ENCOUNTER — Telehealth: Payer: Self-pay | Admitting: *Deleted

## 2012-02-23 NOTE — Telephone Encounter (Signed)
Message copied by Eustace Moore on Wed Feb 23, 2012  8:19 AM ------      Message from: Rande Brunt      Created: Tue Feb 22, 2012 10:37 AM       NL stress test. F/u with GD as scheduled.

## 2012-02-23 NOTE — Telephone Encounter (Signed)
Wife informed

## 2012-02-29 ENCOUNTER — Encounter (INDEPENDENT_AMBULATORY_CARE_PROVIDER_SITE_OTHER): Payer: Self-pay | Admitting: Internal Medicine

## 2012-02-29 ENCOUNTER — Ambulatory Visit (INDEPENDENT_AMBULATORY_CARE_PROVIDER_SITE_OTHER): Payer: Medicare Other | Admitting: Internal Medicine

## 2012-02-29 VITALS — BP 158/72 | HR 76 | Temp 97.8°F | Ht 74.0 in | Wt 294.6 lb

## 2012-02-29 DIAGNOSIS — D649 Anemia, unspecified: Secondary | ICD-10-CM

## 2012-02-29 DIAGNOSIS — R635 Abnormal weight gain: Secondary | ICD-10-CM

## 2012-02-29 DIAGNOSIS — R194 Change in bowel habit: Secondary | ICD-10-CM

## 2012-02-29 DIAGNOSIS — R198 Other specified symptoms and signs involving the digestive system and abdomen: Secondary | ICD-10-CM

## 2012-02-29 MED ORDER — POLYETHYLENE GLYCOL 3350 17 GM/SCOOP PO POWD: 17.0000 g | Freq: Every day | ORAL | Status: AC

## 2012-02-29 NOTE — Patient Instructions (Signed)
Consultation with dietitian to be arranged. Take polyethylene glycol or  MiraLax daily but can titrate the dose from half to one scoop daily.

## 2012-03-01 DIAGNOSIS — D649 Anemia, unspecified: Secondary | ICD-10-CM | POA: Insufficient documentation

## 2012-03-01 NOTE — Progress Notes (Signed)
Presenting complaint;  Irregular bowel movements. My doctor wants me to have a colonoscopy.  Subjective:  Bryce Berry a 71 year old African male was here for scheduled visit accompanied by his wife. He previously has been seen by me for nausea and vomiting felt to be secondary to gastroparesis and most recently in March 2013 following an episode of rectal bleeding for which she was hospitalized at [facility] in [facility]. His bleeding episode was felt to be secondary to colonic diverticulosis. His last colonoscopy was in July 2011 and therefore another one was not recommended unless the bleeding was to recur. He now presents with irregular bowel movements. He has diarrhea and/or constipation. When he is constipated he can go 2 or 3 days without a bowel movement and when he is having diarrhea he has 3-4 bowel movements per day. He denies melena or rectal bleeding. He is taking polyethylene glycol on as-needed basis. He states his appetite is very good. His wife states that he does not eat 3 meals but eats a large meal in the evening. He is using a walker to move around which is very little if any. He denies abdominal pain nausea vomiting or heartburn. He is not having any side effects a single dose of metoclopromide.  Current Medications: Current Outpatient Prescriptions  Medication Sig Dispense Refill  . allopurinol (ZYLOPRIM) 100 MG tablet Take 200 mg by mouth daily.       Marland Kitchen alum & mag hydroxide-simeth (MYLANTA) 200-200-20 MG/5ML suspension Take by mouth as needed.        Marland Kitchen ascorbic acid (VITAMIN C) 500 MG tablet Take 500 mg by mouth daily.       Marland Kitchen aspirin EC 81 MG tablet Take 1 tablet (81 mg total) by mouth daily.      . calcium-vitamin D (OSCAL WITH D) 500-200 MG-UNIT per tablet Take 1 tablet by mouth daily.       . cilostazol (PLETAL) 100 MG tablet Take 100 mg by mouth daily.        . colchicine 0.6 MG tablet Take 0.6 mg by mouth daily as needed.       . diltiazem (CARDIZEM CD) 240 MG 24 hr  capsule Take 1 capsule (240 mg total) by mouth daily.  90 capsule  3  . docusate sodium (COLACE) 100 MG capsule Take 100 mg by mouth 2 (two) times daily.        Marland Kitchen doxycycline (DORYX) 100 MG EC tablet Take 100 mg by mouth 2 (two) times daily.        . fenofibrate (TRICOR) 145 MG tablet Take 1 tablet (145 mg total) by mouth daily.  30 tablet  6  . Ferrous Sulfate (RA IRON) 27 MG TABS Take 1 tablet by mouth daily.        . finasteride (PROSCAR) 5 MG tablet Take 5 mg by mouth daily.        . furosemide (LASIX) 20 MG tablet Take 1 tablet (20 mg total) by mouth daily.  90 tablet  3  . HYDROcodone-acetaminophen (NORCO) 10-325 MG per tablet Take 1 tablet by mouth 2 (two) times daily as needed.        . metoCLOPramide (REGLAN) 10 MG tablet Take 10 mg by mouth daily.      . metoprolol (LOPRESSOR) 100 MG tablet Take 1 tablet (100 mg total) by mouth 2 (two) times daily.  180 tablet  3  . Multiple Vitamin (MULTIVITAMIN) tablet Take 1 tablet by mouth daily.        Marland Kitchen  nitroGLYCERIN (NITROSTAT) 0.4 MG SL tablet Place 1 tablet (0.4 mg total) under the tongue every 5 (five) minutes as needed.  25 tablet  3  . Omega-3 Fatty Acids (FISH OIL) 1000 MG CAPS Take 1 capsule by mouth 2 (two) times daily.       . pantoprazole (PROTONIX) 40 MG tablet Take 40 mg by mouth daily.        . potassium chloride SA (K-DUR,KLOR-CON) 20 MEQ tablet Take 1 tablet (20 mEq total) by mouth daily.  90 tablet  3  . rosuvastatin (CRESTOR) 40 MG tablet Take 1 tablet (40 mg total) by mouth every evening.  90 tablet  3  . terazosin (HYTRIN) 5 MG capsule Take 5 mg by mouth daily.        . polyethylene glycol powder (GLYCOLAX/MIRALAX) powder Take 17 g by mouth daily.  527 g  3  . psyllium (METAMUCIL SMOOTH TEXTURE) 28 % packet Take 1 packet by mouth as needed.      Marland Kitchen DISCONTD: solifenacin (VESICARE) 10 MG tablet Take 10 mg by mouth daily.          Objective: Blood pressure 158/72, pulse 76, temperature 97.8 F (36.6 C), height 6\' 2"  (1.88 m),  weight 294 lb 9.6 oz (133.63 kg). Patient is alert and able to move from chair to examination table without any assistance. Conjunctiva is pink. Sclera is nonicteric Oropharyngeal mucosa is normal. No neck masses or thyromegaly noted. Cardiac exam with regular rhythm normal S1 and S2. No murmur or gallop noted. Lungs are clear to auscultation. Abdomen is obese. Bowel sounds are normal. Shifting dullness is absent. Abdomen is soft and nontender without organomegaly or masses the  He has trace edema around his ankles and legs. No clubbing noted.    Assessment:  #1. Irregular bowel movements. His primary problem is constipation secondary to his medications. He is using polyethylene glycol on a when necessary basis and experiencing diarrhea. He needs to be taking polyethylene glycol on daily basis but at a reduced dose. #2. Anemia. Will request copy of blood work from Dr. Bonnita Levan office and determine the next step. He had lower GI bleed in March 20 13th felt to be secondary to diverticulosis. He hasn't passed blood per rectum since then. #3. History of colonic polyps. Last colonoscopy was in July 2011. Unless he has recurrent bleed he can wait 1 more year. #4. Weight gain. He has gained 25 pounds since his last visit in March 2013. He has trace spell he edema and abdominal exam is negative for ascites. I believe his weight gain is secondary to consumption of too many calories and virtually no physical activity on account of his arthritis. He would benefit from consultation with a dietitian. His TSH has been normal.   Plan:  Will contact patient after all of his blood work reviewed from Dr. Bonnita Levan office. Continue Metamucil 1 pack daily or equivalent. Take polyethylene glycol half to one scoop daily. Consultation with the dietitian in order to help him cut back on calorie intake. He will keep stool diary and return for office visit in 2 months.

## 2012-04-10 ENCOUNTER — Other Ambulatory Visit: Payer: Self-pay | Admitting: *Deleted

## 2012-04-10 ENCOUNTER — Encounter: Payer: Self-pay | Admitting: *Deleted

## 2012-04-10 DIAGNOSIS — E785 Hyperlipidemia, unspecified: Secondary | ICD-10-CM

## 2012-04-10 DIAGNOSIS — I2510 Atherosclerotic heart disease of native coronary artery without angina pectoris: Secondary | ICD-10-CM

## 2012-04-10 DIAGNOSIS — Z79899 Other long term (current) drug therapy: Secondary | ICD-10-CM

## 2012-04-11 ENCOUNTER — Encounter (INDEPENDENT_AMBULATORY_CARE_PROVIDER_SITE_OTHER): Payer: Self-pay

## 2012-05-01 ENCOUNTER — Ambulatory Visit (INDEPENDENT_AMBULATORY_CARE_PROVIDER_SITE_OTHER): Payer: Medicare Other | Admitting: Internal Medicine

## 2012-05-01 ENCOUNTER — Encounter (INDEPENDENT_AMBULATORY_CARE_PROVIDER_SITE_OTHER): Payer: Self-pay | Admitting: Internal Medicine

## 2012-05-01 VITALS — BP 142/72 | HR 76 | Temp 99.1°F | Resp 14 | Ht 74.0 in | Wt 299.3 lb

## 2012-05-01 DIAGNOSIS — K3184 Gastroparesis: Secondary | ICD-10-CM

## 2012-05-01 DIAGNOSIS — D649 Anemia, unspecified: Secondary | ICD-10-CM

## 2012-05-01 DIAGNOSIS — K5900 Constipation, unspecified: Secondary | ICD-10-CM

## 2012-05-01 MED ORDER — METOCLOPRAMIDE HCL 10 MG PO TABS: 10.0000 mg | ORAL_TABLET | Freq: Every day | ORAL | Status: DC

## 2012-05-01 NOTE — Progress Notes (Signed)
Presenting complaint;  Irregular bowel movement. Followup for gastroparesis and rectal bleeding.  Subjective:  Bryce Berry a 71 year old ref American male who is here for scheduled visit accompanied by his wife. On his last visit 2 months ago he was complaining of irregular bowel movements. He was having diarrhea as well as constipation. Now he is complaining of constipation. He hasn't had any episode or rectal bleeding since his lot last visit. He has gained another 5 pounds since his last visit. On account of his severe arthritis he's not able to do much physical activity. He also would like to get a refill on his metoclopramide. She denies any tremors.  Current Medications: Current Outpatient Prescriptions  Medication Sig Dispense Refill  . allopurinol (ZYLOPRIM) 100 MG tablet Take 200 mg by mouth daily.       Marland Kitchen alum & mag hydroxide-simeth (MYLANTA) 200-200-20 MG/5ML suspension Take by mouth as needed.        Marland Kitchen ascorbic acid (VITAMIN C) 500 MG tablet Take 500 mg by mouth daily.       Marland Kitchen aspirin EC 81 MG tablet Take 1 tablet (81 mg total) by mouth daily.      . calcium-vitamin D (OSCAL WITH D) 500-200 MG-UNIT per tablet Take 1 tablet by mouth daily.       . cilostazol (PLETAL) 100 MG tablet Take 100 mg by mouth daily.        . colchicine 0.6 MG tablet Take 0.6 mg by mouth daily as needed.       . diltiazem (CARDIZEM CD) 240 MG 24 hr capsule Take 1 capsule (240 mg total) by mouth daily.  90 capsule  3  . docusate sodium (COLACE) 100 MG capsule Take 100 mg by mouth 2 (two) times daily.        . fenofibrate (TRICOR) 145 MG tablet Take 1 tablet (145 mg total) by mouth daily.  30 tablet  6  . Ferrous Sulfate (RA IRON) 27 MG TABS Take 1 tablet by mouth daily.        . finasteride (PROSCAR) 5 MG tablet Take 5 mg by mouth daily.        . furosemide (LASIX) 20 MG tablet Take 1 tablet (20 mg total) by mouth daily.  90 tablet  3  . HYDROcodone-acetaminophen (NORCO) 10-325 MG per tablet Take 1 tablet by  mouth 2 (two) times daily as needed.        . metoCLOPramide (REGLAN) 10 MG tablet Take 10 mg by mouth daily.      . metoprolol (LOPRESSOR) 100 MG tablet Take 1 tablet (100 mg total) by mouth 2 (two) times daily.  180 tablet  3  . Multiple Vitamin (MULTIVITAMIN) tablet Take 1 tablet by mouth daily.        . nitroGLYCERIN (NITROSTAT) 0.4 MG SL tablet Place 1 tablet (0.4 mg total) under the tongue every 5 (five) minutes as needed.  25 tablet  3  . Omega-3 Fatty Acids (FISH OIL) 1000 MG CAPS Take 1 capsule by mouth 2 (two) times daily.       . pantoprazole (PROTONIX) 40 MG tablet Take 40 mg by mouth daily.        . potassium chloride SA (K-DUR,KLOR-CON) 20 MEQ tablet Take 1 tablet (20 mEq total) by mouth daily.  90 tablet  3  . psyllium (METAMUCIL SMOOTH TEXTURE) 28 % packet Take 1 packet by mouth as needed.      . rosuvastatin (CRESTOR) 40 MG tablet Take 1 tablet (40  mg total) by mouth every evening.  90 tablet  3  . terazosin (HYTRIN) 5 MG capsule Take 5 mg by mouth daily.        Marland Kitchen DISCONTD: solifenacin (VESICARE) 10 MG tablet Take 10 mg by mouth daily.          Objective: Blood pressure 142/72, pulse 76, temperature 99.1 F (37.3 C), temperature source Oral, resp. rate 14, height 6\' 2"  (1.88 m), weight 299 lb 4.8 oz (135.762 kg). Patient is alert. He is able to move from chair to examination table with some help. Conjunctiva is pink. Sclera is nonicteric Oropharyngeal mucosa is normal. No neck masses or thyromegaly noted. Abdomen is obese. It is soft and nontender without organomegaly or masses. He has trace edema around both ankles.   Labs/studies Results: H&H from 03/23/2012 was 11 and 34. WBC 7.5 and platelet count 154K Serum albumin 3.3.  Assessment:   #1. Anemia. He appears to have anemia of chronic disease. His hemoglobin is better than it has been in the past. He has not passed any blood per him in the last 6 months. #2. GERD/gastroparesis. He is doing well with PPI and  low-dose metoclopramide and having no side effects. #3. Constipation. He has irritable bowel syndrome. Previously he was having both diarrhea and constipation and now he is having constipation alone. We need to go back on the dose of polyethylene glycol. #4. Weight gain. It does not appear to be primarily due to  fluid overload. CT that he had few months ago did not show any ascites. I doubt that he has cirrhosis given albumin is 3.3 and no evidence of splenomegaly.   Plan:  Increase polyethyl in glycol to 3/4-1 scoop daily. Use Dulcolax suppository or Fleet enema if you go 2 days without a bowel movement. New prescription for metoclopramide 10 mg daily sent  To his pharmacy. Office visit in 6 months.

## 2012-05-01 NOTE — Patient Instructions (Signed)
Increase polyethylene glycol dose to 3/4 to one scoop daily. Can use Dulcolax suppository or Fleet enema if you go 2 days without a bowel movement.

## 2012-07-03 ENCOUNTER — Encounter (INDEPENDENT_AMBULATORY_CARE_PROVIDER_SITE_OTHER): Payer: Self-pay

## 2012-08-01 ENCOUNTER — Telehealth: Payer: Self-pay | Admitting: *Deleted

## 2012-08-01 DIAGNOSIS — I2510 Atherosclerotic heart disease of native coronary artery without angina pectoris: Secondary | ICD-10-CM

## 2012-08-01 DIAGNOSIS — E785 Hyperlipidemia, unspecified: Secondary | ICD-10-CM

## 2012-08-01 DIAGNOSIS — Z79899 Other long term (current) drug therapy: Secondary | ICD-10-CM

## 2012-08-01 NOTE — Telephone Encounter (Signed)
Spoke with wife Bryce Berry).  Patient has not had any cholesterol labs done recently.  Also, due for 6 month follow up with Dr. Myrtis Ser this December.    Scheduled first available with Dr. Myrtis Ser for 09/22/2012.  She does request to go ahead with labs prior to this OV.  Informed wife that orders will be faxed to [facility] & he can go fasting anytime prior to OV.  She verbalized understanding.  Wife also questioned what all was included in liver function & thinks he is having issue with abdominal distention.  Informed her that if she felt something acute was going on & wanted to be seen sooner, we could offer to see PA here.  Otherwise, she would need to take him to PMD as he has not been seen since June (would need to be evaluated by MD).  Wife stated they would take OV with Dr. Myrtis Ser for February & she will take him to Dr. Sherril Croon in the meantime.    Note:  Per [facility] encounters, patient has recently been seen by GI also.

## 2012-09-18 ENCOUNTER — Encounter: Payer: Self-pay | Admitting: Cardiology

## 2012-09-22 ENCOUNTER — Ambulatory Visit (INDEPENDENT_AMBULATORY_CARE_PROVIDER_SITE_OTHER): Payer: Medicare Other | Admitting: Cardiology

## 2012-09-22 ENCOUNTER — Encounter: Payer: Self-pay | Admitting: Cardiology

## 2012-09-22 VITALS — BP 129/86 | HR 103 | Ht 74.0 in | Wt 297.0 lb

## 2012-09-22 DIAGNOSIS — IMO0002 Reserved for concepts with insufficient information to code with codable children: Secondary | ICD-10-CM | POA: Insufficient documentation

## 2012-09-22 DIAGNOSIS — I4891 Unspecified atrial fibrillation: Secondary | ICD-10-CM

## 2012-09-22 DIAGNOSIS — E785 Hyperlipidemia, unspecified: Secondary | ICD-10-CM

## 2012-09-22 DIAGNOSIS — I2510 Atherosclerotic heart disease of native coronary artery without angina pectoris: Secondary | ICD-10-CM

## 2012-09-22 DIAGNOSIS — I739 Peripheral vascular disease, unspecified: Secondary | ICD-10-CM

## 2012-09-22 DIAGNOSIS — I10 Essential (primary) hypertension: Secondary | ICD-10-CM

## 2012-09-22 DIAGNOSIS — R9430 Abnormal result of cardiovascular function study, unspecified: Secondary | ICD-10-CM | POA: Insufficient documentation

## 2012-09-22 DIAGNOSIS — I4892 Unspecified atrial flutter: Secondary | ICD-10-CM

## 2012-09-22 DIAGNOSIS — M549 Dorsalgia, unspecified: Secondary | ICD-10-CM

## 2012-09-22 NOTE — Assessment & Plan Note (Signed)
There has been no clinical return of atrial flutter.

## 2012-09-22 NOTE — Patient Instructions (Addendum)

## 2012-09-22 NOTE — Assessment & Plan Note (Signed)
The patient has peripheral arterial disease but he is not symptomatic at rest.

## 2012-09-22 NOTE — Assessment & Plan Note (Signed)
In general the patient has many aches and pains in this limits his ability to be active.

## 2012-09-22 NOTE — Assessment & Plan Note (Signed)
The patient is receiving cholesterol medications. We had recommended that his labs rechecked when he was here last. His wife admits that they failed to follow through. Labs are being checked today and results will be sent also to his primary physician.

## 2012-09-22 NOTE — Assessment & Plan Note (Addendum)
The patient has known coronary disease. His last nuclear study was July, 2013. There was no significant ischemia. He's not having any significant symptoms. No further workup is needed.  Today I spent greater than 25 minutes reviewing all of his records and updating his chart and talking to the patient and his wife. More than half of this time was spent directly talking with them and reviewing all of his issues.

## 2012-09-22 NOTE — Assessment & Plan Note (Signed)
He had some atrial fibrillation after hip surgery in 2012. He reverted to sinus rhythm and this has not returned.

## 2012-09-22 NOTE — Progress Notes (Signed)
HPI  Patient is seen today to follow coronary disease and dyslipidemia and a history of atrial fibrillation that was short lived. I had seen the patient in the remote past. He was then seen by others in the group and most recently was seen by Mr.Serpe in June, 2013. As part of today's evaluation I have read reviewed the old records and completely updated the electronic medical record. Prior to being seen in June, 2013 the patient had a 2-D echo revealing an ejection fraction of 50-55%. There was inferior hypokinesis. After his last visit he had a nuclear SPECT stress study. This showed no definite ischemia.  Patient has significant arthritis that limits his activity. It is therefore very difficult to assess his exercise capacity. He's not having any chest pain or significant shortness of breath at rest. His wife describes some edema despite his 20 mg of Lasix daily. She does give him an extra dose from time to time. I discussed this with her and made it clear that using 40 mg a day would be safe.  Allergies  Allergen Reactions  . Iodine     REACTION: rash  . Penicillins   . Sulfonamide Derivatives     Current Outpatient Prescriptions  Medication Sig Dispense Refill  . allopurinol (ZYLOPRIM) 100 MG tablet Take 200 mg by mouth daily.       Marland Kitchen alum & mag hydroxide-simeth (MYLANTA) 200-200-20 MG/5ML suspension Take by mouth as needed.        Marland Kitchen ascorbic acid (VITAMIN C) 500 MG tablet Take 500 mg by mouth daily.       Marland Kitchen aspirin EC 81 MG tablet Take 1 tablet (81 mg total) by mouth daily.      . calcium-vitamin D (OSCAL WITH D) 500-200 MG-UNIT per tablet Take 1 tablet by mouth daily.       . cilostazol (PLETAL) 100 MG tablet Take 100 mg by mouth daily.        . colchicine 0.6 MG tablet Take 0.6 mg by mouth daily as needed.       . diltiazem (CARDIZEM CD) 240 MG 24 hr capsule Take 1 capsule (240 mg total) by mouth daily.  90 capsule  3  . fenofibrate (TRICOR) 145 MG tablet Take 1 tablet (145 mg  total) by mouth daily.  30 tablet  6  . Ferrous Sulfate (RA IRON) 27 MG TABS Take 1 tablet by mouth daily.        . finasteride (PROSCAR) 5 MG tablet Take 5 mg by mouth daily.        . furosemide (LASIX) 20 MG tablet Take 1 tablet (20 mg total) by mouth daily.  90 tablet  3  . HYDROcodone-acetaminophen (NORCO) 10-325 MG per tablet Take 1 tablet by mouth 2 (two) times daily as needed.        . meloxicam (MOBIC) 7.5 MG tablet Take 7.5 mg by mouth daily.      . metoCLOPramide (REGLAN) 10 MG tablet Take 1 tablet (10 mg total) by mouth daily.  30 tablet  5  . metoprolol (LOPRESSOR) 100 MG tablet Take 1 tablet (100 mg total) by mouth 2 (two) times daily.  180 tablet  3  . Multiple Vitamin (MULTIVITAMIN) tablet Take 1 tablet by mouth daily.        . nitroGLYCERIN (NITROSTAT) 0.4 MG SL tablet Place 1 tablet (0.4 mg total) under the tongue every 5 (five) minutes as needed.  25 tablet  3  . Omega-3 Fatty Acids (  FISH OIL) 1000 MG CAPS Take 1 capsule by mouth 2 (two) times daily.       . pantoprazole (PROTONIX) 40 MG tablet Take 40 mg by mouth daily.        . potassium chloride SA (K-DUR,KLOR-CON) 20 MEQ tablet Take 1 tablet (20 mEq total) by mouth daily.  90 tablet  3  . psyllium (METAMUCIL SMOOTH TEXTURE) 28 % packet Take 1 packet by mouth as needed.      . rosuvastatin (CRESTOR) 40 MG tablet Take 1 tablet (40 mg total) by mouth every evening.  90 tablet  3  . terazosin (HYTRIN) 5 MG capsule Take 5 mg by mouth daily.        . [DISCONTINUED] solifenacin (VESICARE) 10 MG tablet Take 10 mg by mouth daily.         History   Social History  . Marital Status: Married    Spouse Name: N/A    Number of Children: N/A  . Years of Education: N/A   Occupational History  . Paramedic     Retired  . Vet     Retired   Social History Main Topics  . Smoking status: Former Smoker -- 1.0 packs/day for 30 years    Types: Cigarettes    Quit date: 08/16/2006  . Smokeless tobacco: Never Used     Comment:  Patient smoked a pack a day for about 30 years  . Alcohol Use: No  . Drug Use: No  . Sexually Active: Not on file   Other Topics Concern  . Not on file   Social History Narrative   Lives with wife at home.    Family History  Problem Relation Age of Onset  . Diabetes Daughter   . Hypertension Daughter   . Obesity Daughter     Past Medical History  Diagnosis Date  . Atrial fibrillation     Postop hip surgery, January, 2012, digoxin and diltiazem, reverted to sinus in the hospital  . Osteoarthritis   . COPD (chronic obstructive pulmonary disease)     Emphysema  . Depression   . Posttraumatic stress disorder   . GERD (gastroesophageal reflux disease)   . Irritable bowel   . BPH (benign prostatic hyperplasia)   . Urinary tract bacterial infections   . Osteoporosis   . Hypertension   . Dyslipidemia   . CAD (coronary artery disease)     Stents placed in [place] and [place] in the past  /Myoview 2008, no ischemia, /   . Atrial flutter     History of the past with syncope and  ?? H. and consideration, no records  . 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 Hip pain   . Shoulder pain   . Gout   . Peripheral arterial disease     Dopplers Dr.Vyas office October, 2010, apparent occlusion anterior tibial arteries bilaterally., findings suggest hemodynamically significant stenosis of the proximal right provisional femoral artery and the mid left superficial artery.  Ankle-brachial indices indicate moderate stenosis within both lower extremities  . Chronic kidney disease     Creatinine 2.1, October 13, 2010  . Hyperlipidemia     Mixed  . GI bleed   . Ejection fraction     EF 50-55%, echo, May, 2013, severe inferior hypokinesis    Past Surgical History  Procedure Date  . Knee arthroplasty     Right  . Total knee arthroplasty     Left  . Total hip arthroplasty  Right-complicated hx  . Colon resection   . Back surgery     [place]  . Prostate surgery      Patient Active Problem List  Diagnosis  . HYPERLIPIDEMIA-MIXED  . HYPERTENSION, UNSPECIFIED  . ATRIAL FLUTTER  . PERIPHERAL VASCULAR DISEASE  . DEGENERATIVE JOINT DISEASE, HIPS  . SHOULDER PAIN  . HIP PAIN  . BACK PAIN  . IMPINGEMENT SYNDROME  . OTH COMPLICATIONS DUE INTERNAL JOINT PROSTHESIS  . RENAL INSUFFICIENCY  . ATRIAL FIBRILLATION, HX OF  . Atrial fibrillation  . Renal insufficiency  . COPD (chronic obstructive pulmonary disease)  . Posttraumatic stress disorder  . GERD (gastroesophageal reflux disease)  . Hypertension  . Dyslipidemia  . CAD (coronary artery disease)  . Atrial flutter  . Gout  . Peripheral arterial disease  . Chronic kidney disease  . Gastroparesis  . Constipation  . History of lower GI bleeding  . Anemia  . Ejection fraction    ROS   Patient denies fever, chills, headache, sweats, rash, change in vision, change in hearing, chest pain, cough, nausea vomiting, urinary symptoms. All other systems are reviewed and are negative other than the history of present illness.  PHYSICAL EXAM  Patient is overweight. He is in a wheelchair. He is oriented to person time and place. Affect is normal. There is no jugular venous distention. Lungs are clear. Respiratory effort is nonlabored. Cardiac exam reveals S1 and S2. There no clicks or significant murmurs. The abdomen is soft. There is trace peripheral edema. There are no skin rashes.  Filed Vitals:   09/22/12 1326  BP: 129/86  Pulse: 103  Height: 6\' 2"  (1.88 m)  Weight: 297 lb (134.718 kg)   EKG is done today and reviewed by me. There is sinus rhythm. There is mild diffuse decrease in voltage. There no diagnostic abnormalities. There is no significant change from the past.  ASSESSMENT & PLAN

## 2012-09-22 NOTE — Assessment & Plan Note (Signed)
Blood pressure is controlled today. No change in therapy. 

## 2012-10-02 ENCOUNTER — Ambulatory Visit (INDEPENDENT_AMBULATORY_CARE_PROVIDER_SITE_OTHER): Payer: Medicare Other | Admitting: Internal Medicine

## 2012-10-02 ENCOUNTER — Encounter (INDEPENDENT_AMBULATORY_CARE_PROVIDER_SITE_OTHER): Payer: Self-pay | Admitting: Internal Medicine

## 2012-10-02 VITALS — BP 162/80 | HR 84 | Temp 98.5°F | Ht 74.0 in | Wt 289.8 lb

## 2012-10-02 DIAGNOSIS — R109 Unspecified abdominal pain: Secondary | ICD-10-CM

## 2012-10-02 NOTE — Patient Instructions (Addendum)
Colonoscopy wotj Dr Karilyn Cota. Needs medical clearance before proceeding.

## 2012-10-02 NOTE — Progress Notes (Signed)
Subjective:     Patient ID: Bryce Berry, male   DOB: Sep 24, 1940, 72 y.o.   MRN: [id number]  HPI Presents today with c/o diarrhea since last Tuesday. He is alternating between diarrhea/constipation. He now has diarrhea. He has had the same symptoms. Seen in September for same.  He saw blood in his stool in December, but none since.  He continues to take Reglan for his gastroparesis. His wife says he has been extremely weak. He has lost 10 pounds since his last visit in September. He has had a low grade fever 2 days ago.  He is coughing up greenish mucous.  In the past few days he has not had a fever.  He had 2 BMs yesterday and loose. He also says he has pain left lower abdominal pain. Hx of diverticulitis with hx of colon resection.  He says he is more comfortable lying on his rt side.  The pain occurs about every other day. 09/22/2012 ALP 66, AST 56, ALT 48. His last colonoscopy was in 2011 for pancolonic  Divcerticulosis. Small diverticula scqattered throughout his colon. Wide open colon anastomosis at 28cm from the anal margin. CBC    Component Value Date/Time   WBC 5.0 12/26/2010 0525   RBC 2.68* 12/26/2010 0525   HGB 8.2* 12/26/2010 0525   HCT 24.3* 12/26/2010 0525   PLT 117* 12/26/2010 0525   MCV 90.7 12/26/2010 0525   MCH 30.6 12/26/2010 0525   MCHC 33.7 12/26/2010 0525   RDW 15.7* 12/26/2010 0525   LYMPHSABS 1.6 12/18/2010 1559   MONOABS 0.8 12/18/2010 1559   EOSABS 0.3 12/18/2010 1559   BASOSABS 0.0 12/18/2010 1559      Review of Systems  See hpi Current Outpatient Prescriptions  Medication Sig Dispense Refill  . allopurinol (ZYLOPRIM) 100 MG tablet Take 200 mg by mouth daily.       Marland Kitchen alum & mag hydroxide-simeth (MYLANTA) 200-200-20 MG/5ML suspension Take by mouth as needed.        Marland Kitchen ascorbic acid (VITAMIN C) 500 MG tablet Take 500 mg by mouth daily.       Marland Kitchen aspirin EC 81 MG tablet Take 1 tablet (81 mg total) by mouth daily.      . calcium-vitamin D (OSCAL WITH D) 500-200 MG-UNIT per  tablet Take 1 tablet by mouth daily.       . cilostazol (PLETAL) 100 MG tablet Take 100 mg by mouth daily.        . colchicine 0.6 MG tablet Take 0.6 mg by mouth daily as needed.       . diltiazem (CARDIZEM CD) 240 MG 24 hr capsule Take 1 capsule (240 mg total) by mouth daily.  90 capsule  3  . fenofibrate (TRICOR) 145 MG tablet Take 1 tablet (145 mg total) by mouth daily.  30 tablet  6  . Ferrous Sulfate (RA IRON) 27 MG TABS Take 1 tablet by mouth daily.        . finasteride (PROSCAR) 5 MG tablet Take 5 mg by mouth daily.        . furosemide (LASIX) 20 MG tablet Take 1 tablet (20 mg total) by mouth daily.  90 tablet  3  . HYDROcodone-acetaminophen (NORCO) 10-325 MG per tablet Take 1 tablet by mouth 2 (two) times daily as needed.        . meloxicam (MOBIC) 7.5 MG tablet Take 7.5 mg by mouth daily.      . metoCLOPramide (REGLAN) 10 MG tablet Take 1  tablet (10 mg total) by mouth daily.  30 tablet  5  . metoprolol (LOPRESSOR) 100 MG tablet Take 1 tablet (100 mg total) by mouth 2 (two) times daily.  180 tablet  3  . Multiple Vitamin (MULTIVITAMIN) tablet Take 1 tablet by mouth daily.        . nitroGLYCERIN (NITROSTAT) 0.4 MG SL tablet Place 1 tablet (0.4 mg total) under the tongue every 5 (five) minutes as needed.  25 tablet  3  . Omega-3 Fatty Acids (FISH OIL) 1000 MG CAPS Take 1 capsule by mouth 2 (two) times daily.       . pantoprazole (PROTONIX) 40 MG tablet Take 40 mg by mouth daily.        . potassium chloride SA (K-DUR,KLOR-CON) 20 MEQ tablet Take 1 tablet (20 mEq total) by mouth daily.  90 tablet  3  . psyllium (METAMUCIL SMOOTH TEXTURE) 28 % packet Take 1 packet by mouth as needed.      . rosuvastatin (CRESTOR) 40 MG tablet Take 1 tablet (40 mg total) by mouth every evening.  90 tablet  3  . terazosin (HYTRIN) 5 MG capsule Take 5 mg by mouth daily.        . [DISCONTINUED] solifenacin (VESICARE) 10 MG tablet Take 10 mg by mouth daily.        No current facility-administered medications for  this visit.   Past Medical History  Diagnosis Date  . Atrial fibrillation     Postop hip surgery, January, 2012, digoxin and diltiazem, reverted to sinus in the hospital  . Osteoarthritis   . COPD (chronic obstructive pulmonary disease)     Emphysema  . Depression   . Posttraumatic stress disorder   . GERD (gastroesophageal reflux disease)   . Irritable bowel   . BPH (benign prostatic hyperplasia)   . Urinary tract bacterial infections   . Osteoporosis   . Hypertension   . Dyslipidemia   . CAD (coronary artery disease)     Stents placed in [place] and [place] in the past  /Myoview 2008, no ischemia, /   . Atrial flutter     History of the past with syncope and  ?? H. and consideration, no records  . 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 Hip pain   . Shoulder pain   . Gout   . Peripheral arterial disease     Dopplers Dr.Vyas office October, 2010, apparent occlusion anterior tibial arteries bilaterally., findings suggest hemodynamically significant stenosis of the proximal right provisional femoral artery and the mid left superficial artery.  Ankle-brachial indices indicate moderate stenosis within both lower extremities  . Chronic kidney disease     Creatinine 2.1, October 13, 2010  . Hyperlipidemia     Mixed  . GI bleed   . Ejection fraction     EF 50-55%, echo, May, 2013, severe inferior hypokinesis   Allergies  Allergen Reactions  . Iodine     REACTION: rash  . Penicillins   . Sulfonamide Derivatives           Objective:   Physical Exam  Filed Vitals:   10/02/12 1424  BP: 162/80  Pulse: 84  Temp: 98.5 F (36.9 C)  Height: 6\' 2"  (1.88 m)  Weight: 289 lb 12.8 oz (131.452 kg)  Patient examined from wheelchair. Alert and oriented. Skin warm and dry. Oral mucosa is moist.   . Sclera anicteric, conjunctivae is pink. Thyroid not enlarged. No cervical lymphadenopathy. Lungs clear. Heart regular rate  and rhythm.  Abdomen is soft. Bowel sounds are  positive.  . No abdominal masses felt.   Tenderness to rt lower abdomen.  No edema to lower extremities.      Assessment:    Left lower quadrant pain. Hx of IBS diarrhea/constipation. Discussed with Dr. Karilyn Cota    Plan:    Colonoscopy. Needs medical clearance before proceeding with procedure.    0 0

## 2012-10-06 ENCOUNTER — Telehealth: Payer: Self-pay | Admitting: *Deleted

## 2012-10-06 NOTE — Telephone Encounter (Signed)
Message copied by Eustace Moore on Fri Oct 06, 2012  9:50 AM ------      Message from: Lesle Chris      Created: Thu Oct 05, 2012  5:15 PM                   ----- Message -----         From: Luis Abed, MD         Sent: 10/04/2012   5:41 PM           To: Lesle Chris, LPN            Cholesterol lab looks good ------

## 2012-10-06 NOTE — Telephone Encounter (Signed)
Patient informed and copy sent to PCP. 

## 2012-10-13 DIAGNOSIS — I4892 Unspecified atrial flutter: Secondary | ICD-10-CM

## 2012-10-13 DIAGNOSIS — I2510 Atherosclerotic heart disease of native coronary artery without angina pectoris: Secondary | ICD-10-CM

## 2012-10-14 ENCOUNTER — Encounter: Payer: Self-pay | Admitting: Cardiology

## 2012-10-15 DIAGNOSIS — I2699 Other pulmonary embolism without acute cor pulmonale: Secondary | ICD-10-CM

## 2012-10-15 DIAGNOSIS — I4892 Unspecified atrial flutter: Secondary | ICD-10-CM

## 2012-10-16 ENCOUNTER — Inpatient Hospital Stay (HOSPITAL_COMMUNITY)
Admission: AD | Admit: 2012-10-16 | Discharge: 2012-10-19 | DRG: 175 | Disposition: A | Discharge status: Home Health | Payer: Medicare Other | Source: Other Acute Inpatient Hospital | Attending: Internal Medicine | Admitting: Internal Medicine

## 2012-10-16 ENCOUNTER — Encounter (HOSPITAL_COMMUNITY): Payer: Self-pay | Admitting: Nurse Practitioner

## 2012-10-16 DIAGNOSIS — I4891 Unspecified atrial fibrillation: Secondary | ICD-10-CM

## 2012-10-16 DIAGNOSIS — K589 Irritable bowel syndrome without diarrhea: Secondary | ICD-10-CM | POA: Diagnosis present

## 2012-10-16 DIAGNOSIS — J449 Chronic obstructive pulmonary disease, unspecified: Secondary | ICD-10-CM | POA: Diagnosis present

## 2012-10-16 DIAGNOSIS — J189 Pneumonia, unspecified organism: Secondary | ICD-10-CM | POA: Diagnosis present

## 2012-10-16 DIAGNOSIS — K219 Gastro-esophageal reflux disease without esophagitis: Secondary | ICD-10-CM | POA: Diagnosis present

## 2012-10-16 DIAGNOSIS — F4310 Post-traumatic stress disorder, unspecified: Secondary | ICD-10-CM | POA: Diagnosis present

## 2012-10-16 DIAGNOSIS — I2699 Other pulmonary embolism without acute cor pulmonale: Principal | ICD-10-CM

## 2012-10-16 DIAGNOSIS — I82401 Acute embolism and thrombosis of unspecified deep veins of right lower extremity: Secondary | ICD-10-CM | POA: Diagnosis present

## 2012-10-16 DIAGNOSIS — Z96659 Presence of unspecified artificial knee joint: Secondary | ICD-10-CM

## 2012-10-16 DIAGNOSIS — Z87891 Personal history of nicotine dependence: Secondary | ICD-10-CM

## 2012-10-16 DIAGNOSIS — Z7982 Long term (current) use of aspirin: Secondary | ICD-10-CM

## 2012-10-16 DIAGNOSIS — Z79899 Other long term (current) drug therapy: Secondary | ICD-10-CM

## 2012-10-16 DIAGNOSIS — F3289 Other specified depressive episodes: Secondary | ICD-10-CM | POA: Diagnosis present

## 2012-10-16 DIAGNOSIS — N189 Chronic kidney disease, unspecified: Secondary | ICD-10-CM | POA: Diagnosis present

## 2012-10-16 DIAGNOSIS — M109 Gout, unspecified: Secondary | ICD-10-CM | POA: Diagnosis present

## 2012-10-16 DIAGNOSIS — I82409 Acute embolism and thrombosis of unspecified deep veins of unspecified lower extremity: Secondary | ICD-10-CM

## 2012-10-16 DIAGNOSIS — M810 Age-related osteoporosis without current pathological fracture: Secondary | ICD-10-CM | POA: Diagnosis present

## 2012-10-16 DIAGNOSIS — Z8719 Personal history of other diseases of the digestive system: Secondary | ICD-10-CM

## 2012-10-16 DIAGNOSIS — E785 Hyperlipidemia, unspecified: Secondary | ICD-10-CM | POA: Diagnosis present

## 2012-10-16 DIAGNOSIS — K649 Unspecified hemorrhoids: Secondary | ICD-10-CM | POA: Diagnosis present

## 2012-10-16 DIAGNOSIS — I2510 Atherosclerotic heart disease of native coronary artery without angina pectoris: Secondary | ICD-10-CM | POA: Diagnosis present

## 2012-10-16 DIAGNOSIS — J4489 Other specified chronic obstructive pulmonary disease: Secondary | ICD-10-CM | POA: Diagnosis present

## 2012-10-16 DIAGNOSIS — I824Y9 Acute embolism and thrombosis of unspecified deep veins of unspecified proximal lower extremity: Secondary | ICD-10-CM | POA: Diagnosis present

## 2012-10-16 DIAGNOSIS — D649 Anemia, unspecified: Secondary | ICD-10-CM

## 2012-10-16 DIAGNOSIS — E7800 Pure hypercholesterolemia, unspecified: Secondary | ICD-10-CM | POA: Diagnosis present

## 2012-10-16 DIAGNOSIS — K5730 Diverticulosis of large intestine without perforation or abscess without bleeding: Secondary | ICD-10-CM | POA: Diagnosis present

## 2012-10-16 DIAGNOSIS — I10 Essential (primary) hypertension: Secondary | ICD-10-CM | POA: Diagnosis present

## 2012-10-16 DIAGNOSIS — I129 Hypertensive chronic kidney disease with stage 1 through stage 4 chronic kidney disease, or unspecified chronic kidney disease: Secondary | ICD-10-CM | POA: Diagnosis present

## 2012-10-16 DIAGNOSIS — Z9861 Coronary angioplasty status: Secondary | ICD-10-CM

## 2012-10-16 DIAGNOSIS — Z86711 Personal history of pulmonary embolism: Secondary | ICD-10-CM | POA: Diagnosis present

## 2012-10-16 DIAGNOSIS — I739 Peripheral vascular disease, unspecified: Secondary | ICD-10-CM

## 2012-10-16 DIAGNOSIS — M1990 Unspecified osteoarthritis, unspecified site: Secondary | ICD-10-CM | POA: Diagnosis present

## 2012-10-16 DIAGNOSIS — I4892 Unspecified atrial flutter: Secondary | ICD-10-CM

## 2012-10-16 DIAGNOSIS — Z9181 History of falling: Secondary | ICD-10-CM

## 2012-10-16 DIAGNOSIS — I25810 Atherosclerosis of coronary artery bypass graft(s) without angina pectoris: Secondary | ICD-10-CM

## 2012-10-16 DIAGNOSIS — K3184 Gastroparesis: Secondary | ICD-10-CM | POA: Diagnosis present

## 2012-10-16 HISTORY — DX: Acute embolism and thrombosis of unspecified deep veins of right lower extremity: I82.401

## 2012-10-16 HISTORY — DX: Other pulmonary embolism without acute cor pulmonale: I26.99

## 2012-10-16 MED ORDER — ASPIRIN EC 81 MG PO TBEC
81.0000 mg | DELAYED_RELEASE_TABLET | Freq: Every day | ORAL | Status: DC
  Administered 2012-10-16 – 2012-10-19 (×4): 81 mg via ORAL
  Filled 2012-10-16 (×4): qty 1

## 2012-10-16 MED ORDER — ENOXAPARIN SODIUM 150 MG/ML ~~LOC~~ SOLN
1.0000 mg/kg | Freq: Two times a day (BID) | SUBCUTANEOUS | Status: DC
  Administered 2012-10-16 – 2012-10-17 (×2): 130 mg via SUBCUTANEOUS
  Filled 2012-10-16 (×8): qty 1

## 2012-10-16 MED ORDER — ACETAMINOPHEN 325 MG PO TABS
650.0000 mg | ORAL_TABLET | Freq: Four times a day (QID) | ORAL | Status: DC | PRN
  Administered 2012-10-18: 650 mg via ORAL
  Filled 2012-10-16: qty 2

## 2012-10-16 MED ORDER — METOCLOPRAMIDE HCL 5 MG PO TABS
5.0000 mg | ORAL_TABLET | Freq: Three times a day (TID) | ORAL | Status: DC
  Administered 2012-10-16 – 2012-10-19 (×11): 5 mg via ORAL
  Filled 2012-10-16 (×17): qty 1

## 2012-10-16 MED ORDER — PANTOPRAZOLE SODIUM 40 MG PO TBEC
40.0000 mg | DELAYED_RELEASE_TABLET | Freq: Every day | ORAL | Status: DC
  Administered 2012-10-17 – 2012-10-18 (×2): 40 mg via ORAL
  Filled 2012-10-16 (×3): qty 1

## 2012-10-16 MED ORDER — ONDANSETRON HCL 4 MG/2ML IJ SOLN: 4.0000 mg | Freq: Four times a day (QID) | INTRAMUSCULAR | Status: DC | PRN

## 2012-10-16 MED ORDER — ONDANSETRON HCL 4 MG PO TABS: 4.0000 mg | ORAL_TABLET | Freq: Four times a day (QID) | ORAL | Status: DC | PRN

## 2012-10-16 MED ORDER — ALBUTEROL SULFATE (5 MG/ML) 0.5% IN NEBU: 2.5000 mg | INHALATION_SOLUTION | RESPIRATORY_TRACT | Status: DC | PRN

## 2012-10-16 MED ORDER — ACETAMINOPHEN 650 MG RE SUPP: 650.0000 mg | Freq: Four times a day (QID) | RECTAL | Status: DC | PRN

## 2012-10-16 MED ORDER — ALLOPURINOL 100 MG PO TABS
100.0000 mg | ORAL_TABLET | Freq: Every day | ORAL | Status: DC
  Administered 2012-10-17 – 2012-10-19 (×3): 100 mg via ORAL
  Filled 2012-10-16 (×3): qty 1

## 2012-10-16 MED ORDER — FINASTERIDE 5 MG PO TABS
5.0000 mg | ORAL_TABLET | Freq: Every day | ORAL | Status: DC
  Administered 2012-10-17 – 2012-10-19 (×3): 5 mg via ORAL
  Filled 2012-10-16 (×3): qty 1

## 2012-10-16 MED ORDER — ALUM & MAG HYDROXIDE-SIMETH 200-200-20 MG/5ML PO SUSP: 30.0000 mL | Freq: Four times a day (QID) | ORAL | Status: DC | PRN

## 2012-10-16 MED ORDER — SODIUM CHLORIDE 0.9 % IJ SOLN
3.0000 mL | Freq: Two times a day (BID) | INTRAMUSCULAR | Status: DC
  Administered 2012-10-16 – 2012-10-17 (×4): 3 mL via INTRAVENOUS

## 2012-10-16 MED ORDER — DILTIAZEM HCL ER COATED BEADS 240 MG PO CP24
240.0000 mg | ORAL_CAPSULE | Freq: Every day | ORAL | Status: DC
  Administered 2012-10-17 – 2012-10-19 (×3): 240 mg via ORAL
  Filled 2012-10-16 (×3): qty 1

## 2012-10-16 MED ORDER — FUROSEMIDE 20 MG PO TABS
20.0000 mg | ORAL_TABLET | Freq: Every day | ORAL | Status: DC
  Administered 2012-10-16 – 2012-10-19 (×4): 20 mg via ORAL
  Filled 2012-10-16 (×4): qty 1

## 2012-10-16 MED ORDER — SENNOSIDES-DOCUSATE SODIUM 8.6-50 MG PO TABS
1.0000 | ORAL_TABLET | Freq: Every evening | ORAL | Status: DC | PRN
  Filled 2012-10-16: qty 1

## 2012-10-16 MED ORDER — FENOFIBRATE 160 MG PO TABS
160.0000 mg | ORAL_TABLET | Freq: Every day | ORAL | Status: DC
  Administered 2012-10-17 – 2012-10-19 (×3): 160 mg via ORAL
  Filled 2012-10-16 (×3): qty 1

## 2012-10-16 MED ORDER — ENOXAPARIN SODIUM 150 MG/ML ~~LOC~~ SOLN: 1.0000 mg/kg | Freq: Two times a day (BID) | SUBCUTANEOUS | Status: DC

## 2012-10-16 MED ORDER — HYDROCODONE-ACETAMINOPHEN 5-325 MG PO TABS
1.0000 | ORAL_TABLET | ORAL | Status: DC | PRN
  Administered 2012-10-16 – 2012-10-19 (×7): 2 via ORAL
  Filled 2012-10-16 (×7): qty 2

## 2012-10-16 MED ORDER — METOPROLOL TARTRATE 100 MG PO TABS
100.0000 mg | ORAL_TABLET | Freq: Two times a day (BID) | ORAL | Status: DC
  Administered 2012-10-16 – 2012-10-19 (×6): 100 mg via ORAL
  Filled 2012-10-16 (×8): qty 1

## 2012-10-16 NOTE — H&P (Signed)
[facility] History and Physical  ALI MOHL ZOX:[id number] DOB: August 02, 1941 DOA: 10/16/2012  Referring physician:  PCP: Ignatius Specking., MD  Specialists: Dr. Karilyn Cota, GI                      Dr. Charlann Boxer, Ortho  Chief Complaint: SOB / Weakness  HPI: Bryce Berry is a 72 y.o. [name] transferred here today from [facility]. His history is listed below and is significant for coronary artery disease status post 4 stents, multiple orthopedic surgeries, paroxysmal atrial fibrillation, COPD, program blood per rectum.  At [facility] hospital he was diagnosed with a DVT in his right popliteal vein and multiple bilateral pulmonary emboli. He was found to be in a flutter at the time of admission it [place].  Per the patient, he noticed in January 2014 that he had become weak. He normally walks with a walker but had declined and now used a wheelchair. He developed bright red blood per rectum and was seeking clearance from his primary care physician for a colonoscopy with Dr. Renae Fickle.  However, he was feeling so poorly with upper respiratory symptoms that Dr. Sherril Croon prescribed antibiotics. These continued to worsen until he began to spit up sputum with streaks of blood on Friday night February 28. His primary care physician sent him to [facility] emergency department who initially diagnosed him with bilateral pneumonia, with atrial flutter and admitted him. They placed him on Rocephin.  His d-dimer was elevated, but he had an allergy to contrast dye so a CT angios not done. Doppler ultrasound completed on March 1 showed subocclusive DVT in the right popliteal vein and no DVT in the left lower extremity. VQ scan completed March 1 showed high probability for bilateral pulmonary emboli mentioning multiple large bilateral segmental and smaller subsegmental perfusion defects, most pronounced in the right upper lobe but also noted in the left upper lobe and left lower lobe. Cardiology was consulted at [facility]. They recommended continuing Cardizem and the triple to keep his heart rate less than or equal to 110 beats per minute. The patient was placed on Lovenox and Gibson Ramp was being considered for long-term anticoagulation therapy.  From the notes it appears they're waiting on insurance approval.  He has had no recent surgery, and he denies any recent travel. He is rather sedentary and obese. He denies any history of clotting disorders in his family, and he has had no previous history of blood clots.  As an aside, patient mentions that hehas treated with chronic antibiotics after his right hip replacement, from the notes it appears that this may be doxycycline. He has seen no) blood per rectum in his bowel movements over the past month. He is complaining of mild right-sided chest tightness.  Reew of Systems:  the patient reports slight shortness of breath with mild right-sided chest tightness even at rest in his hospital bed. He denies any abdominal pain or anorexia. Your warts that his stools are often irregular swinging from diarrhea to constipation. He denies any headache or changes in vision. He is still having a productive cough but there has been no blood in his sputum since Saturday morning. He endorses a 10 pound weight loss that he consciously obtained. All other systems were reviewed and found to be negative   Past Medical History  Diagnosis Date  . Atrial fibrillation     Postop hip surgery, January, 2012, digoxin and diltiazem, reverted to sinus in the hospital  . Osteoarthritis   .  COPD (chronic obstructive pulmonary disease)     Emphysema  . Depression   . Posttraumatic stress disorder   . GERD (gastroesophageal reflux disease)   . Irritable bowel   . BPH (benign prostatic hyperplasia)   . Urinary tract bacterial infections   . Osteoporosis   . Hypertension   . Dyslipidemia   . CAD (coronary artery disease)     Stents placed in [place] and [place] in the past  /Myoview 2008, no  ischemia, /   . Atrial flutter     History of the past with syncope and  ?? H. and consideration, no records  . 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 Hip pain   . Shoulder pain   . Gout   . Peripheral arterial disease     Dopplers Dr.Vyas office October, 2010, apparent occlusion anterior tibial arteries bilaterally., findings suggest hemodynamically significant stenosis of the proximal right provisional femoral artery and the mid left superficial artery.  Ankle-brachial indices indicate moderate stenosis within both lower extremities  . Chronic kidney disease     Creatinine 2.1, October 13, 2010  . Hyperlipidemia     Mixed  . GI bleed   . Ejection fraction     EF 50-55%, echo, May, 2013, severe inferior hypokinesis   Past Surgical History  Procedure Laterality Date  . Knee arthroplasty      Right  . Total knee arthroplasty      Left  . Total hip arthroplasty      Right-complicated hx  . Colon resection    . Back surgery      [place]  . Prostate surgery     Social History:  reports that he quit smoking about 6 years ago. His smoking use included Cigarettes. He has a 30 pack-year smoking history. He has never used smokeless tobacco. He reports that he does not drink alcohol or use illicit drugs.  he has a history of drinking alcohol but last drank approximately 10 years ago.   He lives at home with his wife normally ambulates with a walker but recently has had to use a wheelchair. He require some assistance with ADLs.   Allergies  Allergen Reactions  . Iodine     REACTION: rash  . Penicillins     Rapid heart rate; flushed  . Sulfonamide Derivatives     Rapid heart rate; flushed    Family History  Problem Relation Age of Onset  . Diabetes Daughter   . Hypertension Daughter   . Obesity Daughter    Mr. Heyward is an only child raised by his grandmother who died at the age of 67 with diabetes, of an infection in her leg. He does not know his parents.    Prior to Admission medications   Medication Sig Start Date End Date Taking? Authorizing Indalecio Malmstrom  allopurinol (ZYLOPRIM) 100 MG tablet Take 100 mg by mouth 2 (two) times daily.    Yes Historical Trea Carnegie, MD  alum & mag hydroxide-simeth (MYLANTA) 200-200-20 MG/5ML suspension Take 15 mLs by mouth as needed for indigestion.    Yes Historical Helina Hullum, MD  ascorbic acid (VITAMIN C) 500 MG tablet Take 500 mg by mouth daily.    Yes Historical Svetlana Bagby, MD  aspirin EC 81 MG tablet Take 1 tablet (81 mg total) by mouth daily. 12/02/11 12/01/12 Yes Prescott Parma, PA  calcium-vitamin D (OSCAL WITH D) 500-200 MG-UNIT per tablet Take 1 tablet by mouth daily.    Yes Historical Kanetra Ho, MD  cilostazol (PLETAL) 100 MG tablet Take 100 mg by mouth daily.     Yes Historical Ethanael Veith, MD  colchicine 0.6 MG tablet Take 0.6 mg by mouth daily as needed (gout flair up).    Yes Historical Auston Halfmann, MD  diltiazem (CARDIZEM CD) 240 MG 24 hr capsule Take 1 capsule (240 mg total) by mouth daily. 02/07/12  Yes Prescott Parma, PA  fenofibrate (TRICOR) 145 MG tablet Take 1 tablet (145 mg total) by mouth daily. 08/16/11  Yes June Leap, MD  Ferrous Sulfate (RA IRON) 27 MG TABS Take 1 tablet by mouth daily.     Yes Historical Suraya Vidrine, MD  finasteride (PROSCAR) 5 MG tablet Take 5 mg by mouth daily.     Yes Historical Massie Mees, MD  furosemide (LASIX) 20 MG tablet Take 1 tablet (20 mg total) by mouth daily. 02/07/12  Yes Prescott Parma, PA  HYDROcodone-acetaminophen (NORCO) 10-325 MG per tablet Take 1 tablet by mouth every 4 (four) hours as needed for pain.    Yes Historical Aeralyn Barna, MD  meloxicam (MOBIC) 7.5 MG tablet Take 7.5 mg by mouth daily.   Yes Historical Brenya Taulbee, MD  metoCLOPramide (REGLAN) 10 MG tablet Take 1 tablet (10 mg total) by mouth daily. 05/01/12  Yes Malissa Hippo, MD  metoprolol (LOPRESSOR) 100 MG tablet Take 1 tablet (100 mg total) by mouth 2 (two) times daily. 02/07/12  Yes Prescott Parma, PA  Multiple Vitamin  (MULTIVITAMIN) tablet Take 1 tablet by mouth daily.     Yes Historical Bridget Westbrooks, MD  nitroGLYCERIN (NITROSTAT) 0.4 MG SL tablet Place 1 tablet (0.4 mg total) under the tongue every 5 (five) minutes as needed. 02/07/12  Yes Prescott Parma, PA  Omega-3 Fatty Acids (FISH OIL) 1000 MG CAPS Take 2 capsules by mouth daily.    Yes Historical Telly Jawad, MD  pantoprazole (PROTONIX) 40 MG tablet Take 40 mg by mouth daily.     Yes Historical Judithe Keetch, MD  potassium chloride SA (K-DUR,KLOR-CON) 20 MEQ tablet Take 1 tablet (20 mEq total) by mouth daily. 02/07/12  Yes Prescott Parma, PA  psyllium (METAMUCIL SMOOTH TEXTURE) 28 % packet Take 1 packet by mouth as needed (for constipation).  11/01/11 10/31/12 Yes Malissa Hippo, MD  rosuvastatin (CRESTOR) 40 MG tablet Take 1 tablet (40 mg total) by mouth every evening. 02/07/12  Yes Prescott Parma, PA  terazosin (HYTRIN) 5 MG capsule Take 5 mg by mouth every evening.    Yes Historical Diesha Rostad, MD   Physical Exam: Filed Vitals:   10/16/12 1316  BP: 132/83  Pulse: 84  Temp: 98.2 F (36.8 C)  TempSrc: Oral  Resp: 18  Height: 6\' 2"  (1.88 m)  Weight: 131.7 kg (290 lb 5.5 oz)  SpO2: 99%     General:   Well-developed morbidly obese, African American gentleman, lying comfortably in bed with very mild work of breathing.  Eyes: sclera clear, conjunctiva pink, pupils equal round    HEENT atraumatic, normocephalic, neck is supple with no obvious lymphadenopathy, oropharynx is without exudates   Cardiovascular: regular rate and rhythm, no murmurs rubs or gallops. No obvious lower extremity edema. Unable to palpate cords on his posterior legs   Abdomen: obese, soft, nontender, nondistended, positive bowel sounds   Skin: No rashes, bruises, lesions. His multiple orthopedic scars appear to be well-healed  Musculoskeletal: able to move all 4 extremities with 5 over 5 strength  Psychiatric: alert and oriented, cooperative, well-groomed, appropriate   Neurologic: cranial  nurse 2 through 12 are grossly intact,  nonfocal   Labs on Admission:  Basic Metabolic Panel: No results found for this basename: NA, K, CL, CO2, GLUCOSE, BUN, CREATININE, CALCIUM, MG, PHOS,  in the last 168 hours Liver Function Tests: No results found for this basename: AST, ALT, ALKPHOS, BILITOT, PROT, ALBUMIN,  in the last 168 hours No results found for this basename: LIPASE, AMYLASE,  in the last 168 hours No results found for this basename: AMMONIA,  in the last 168 hours CBC: No results found for this basename: WBC, NEUTROABS, HGB, HCT, MCV, PLT,  in the last 168 hours Cardiac Enzymes: No results found for this basename: CKTOTAL, CKMB, CKMBINDEX, TROPONINI,  in the last 168 hours  BNP (last 3 results) No results found for this basename: PROBNP,  in the last 8760 hours CBG: No results found for this basename: GLUCAP,  in the last 168 hours  Radiological Exams on Admission: No results found.  EKG:  Pending.  Assessment/Plan  Med Rec not yet done.   Please reconcile home medications.  Bilateral PE and RLE DVT On lovenox Chronic anticoagulation with coumadin or Gibson Ramp will be necessary.  Case management consult requested.   Aflutter / with history of PAfib On Metoprolol and Cardizem [facility] on board. Currently in normal sinus rhythm.  Possible Pneumonia vs right lower lobe atelectasis Need final sputum results from [facility].  Gram stain showed moderate grm + cocci and moderate WBCs. Will not continue rocephin for now.   Hypertension Currently controlled on Metoprolol and Cardizem  High Cholesterol Continue home medications (Fenofibrate)  H/o Gout On Allopurinol  Recent history of falls per wife PT eval requested.   Gastroparesis Stable.  Reglan  Gerd Protonix  BRBPR H/O alternating diarrhea and constipation - possibly hemorrhoids. Monitor Hgb. Guiac Stools.    [facility] on board.      Code Status: full (must indicate code  status--if unknown or must be presumed, indicate so) Family Communication: wife at bedside Disposition Plan: to home when appropriate  Time spent: 60 min.  Conley Canal [facility] Pager [phone]  If 7PM-7AM, please contact night-coverage [email or web address] Password [email or web address] 10/16/2012, 2:39 PM  Attending Patient seen and examined, agree with assessment and plan as outlined above.Transferred from [facility] DVT and high probability for PE on VQ Scan. Also has Aflutter while at [facility]. Will get cards eval, continue with Lovenox for now, monitor CBC, assess fall risk-and then decide on whether to place on Coumadin and Xarelto for long term anticoagulation. D/w patient and wife at bedside.  S Ghimire

## 2012-10-16 NOTE — Progress Notes (Signed)
Patient Name: Bryce Berry Date of Encounter: 10/16/2012   Active Problems:   Right leg DVT   Bilateral pulmonary embolism   Atrial flutter   CAD (coronary artery disease)   Chronic kidney disease   COPD (chronic obstructive pulmonary disease)   GERD (gastroesophageal reflux disease)   Hypertension   Dyslipidemia   Peripheral arterial disease   History of lower GI bleeding   SUBJECTIVE  Pt presented on tx from [facility] today after presenting there over the weekend with progressive dyspnea and subsequently found to be in rapid atrial flutter.  He was rate controlled with dilt/bb and placed on anticoagulation.  D dimer was elevated and subsequent LE u/s showed R DVT, while V:Q scan (performed instead of CT 2/2 contrast allergy) was high prob for bilat PE.  He has been maintained on lovenox and consideration is being given to xarelto therapy pending insurance approval.  He is currently w/o complaint.  CURRENT MEDS . furosemide  20 mg Oral Daily  . metoprolol tartrate  100 mg Oral BID    OBJECTIVE  Filed Vitals:   10/16/12 1316  BP: 132/83  Pulse: 84  Temp: 98.2 F (36.8 C)  TempSrc: Oral  Resp: 18  Height: 6\' 2"  (1.88 m)  Weight: 290 lb 5.5 oz (131.7 kg)  SpO2: 99%   No intake or output data in the 24 hours ending 10/16/12 1519 Filed Weights   10/16/12 1316  Weight: 290 lb 5.5 oz (131.7 kg)   PHYSICAL EXAM  General: Pleasant, NAD. Neuro: Alert and oriented X 3. Moves all extremities spontaneously. Psych: Normal affect. HEENT:  Normal  Neck: Supple without bruits or JVD. Lungs:  Resp regular and unlabored, CTA. Heart: RRR no s3, s4, or murmurs. Abdomen: Soft, non-tender, non-distended, BS + x 4.  Extremities: No clubbing, cyanosis or edema. DP/PT/Radials 2+ and equal bilaterally.  Accessory Clinical Findings  Labs from [facility] reviewed:  H/H  11.2/33.9; WBC  11.3;  PLT 107 Na 137, K 3.7, Cl 106, CO2 26, BUN 14, Creat 0.74, Gluc 100 D  dimer 2.82  TELE  rsr  ECG  From [facility] - 2/28 - aflutter vs coarse afib, 72, lat twi.  Radiology/Studies  *V:Q scan: High prob study for bilat PE. *R LE U/S: subocclusive dvt in the right popliteal vein.  No left sided dvt.  ASSESSMENT AND PLAN  1.  Bilateral Pulmonary Emboli:  In setting of RLE DVT.  Agree with ongoing anticoagulation.  Currently he is on lovenox while xarelto is being considered.  He does have a reported h/o renal insufficiency, though his creat was nl this AM  @ [facility].  He also has a h/o GIB - colonic diverticulosis, and is followed by Dr. Karilyn Cota as an outpt.  He was due to have a colonoscopy in the near future 2/2 recurrent bloody stool in December.  2.  RLE DVT:  Anticoagulation as above.  3.  Afib/flutter:  He had recurrent atrial arrhythmias in setting of acute PE.  He is now in sinus rhythm.  Cont rate controlling agents.  CHADS2=1, CHA2DS2VASc=3.  4.  Colonic Diverticulosis with h/o brbpr:  He was being w/u by GI with colonoscopy pending.  He does not currently have brbpr.  With #1 & #2, anticoagulation is essential.  H/H stable.  Follow with anticoagulation.  Signed, Nicolasa Ducking NP  Patient seen and examined with Flavia Shipper  Briefly a 72 yo with history of CAD, atrial flutter, obesity.  Seen in clinic  in Feb with dyspnea.  He says he has progressively gotten weaker over past 2 months.  Does not move much.  Notes dependent LE edema.   Admitted to [facility] and found to have bilateral PE.  Was in afib/flutter and has since converted to SR. My exam is in H &P above.  Plan:  Echo to eval LV and RV function.  Follow on tele.   Anticoag for PE and afib.   Will follow with you.

## 2012-10-17 DIAGNOSIS — I369 Nonrheumatic tricuspid valve disorder, unspecified: Secondary | ICD-10-CM

## 2012-10-17 LAB — BASIC METABOLIC PANEL
CO2: 25 mEq/L (ref 19–32)
Calcium: 9.4 mg/dL (ref 8.4–10.5)
GFR calc Af Amer: >90 mL/min (ref >90)
GFR calc non Af Amer: 87 mL/min — ABNORMAL LOW (ref >90)
Sodium: 137 mEq/L (ref 135–145)

## 2012-10-17 LAB — PROTIME-INR: Prothrombin Time: 13.6 seconds (ref 11.6–15.2)

## 2012-10-17 LAB — CBC
MCH: 28.7 pg (ref 26.0–34.0)
Platelets: 131 10*3/uL — ABNORMAL LOW (ref 150–400)
RBC: 3.9 MIL/uL — ABNORMAL LOW (ref 4.22–5.81)

## 2012-10-17 MED ORDER — RIVAROXABAN 15 MG PO TABS
15.0000 mg | ORAL_TABLET | Freq: Two times a day (BID) | ORAL | Status: DC
  Administered 2012-10-17 – 2012-10-19 (×4): 15 mg via ORAL
  Filled 2012-10-17 (×5): qty 1

## 2012-10-17 NOTE — Evaluation (Signed)
Physical Therapy Evaluation Patient Details Name: Bryce Berry MRN: [id number] DOB: [birth year] Today's Date: 10/17/2012 Time: 1129-1203 PT Time Calculation (min): 34 min  PT Assessment / Plan / Recommendation Clinical Impression  Bryce Berry is a 72 y.o. with history of coronary artery disease status post 4 stents, multiple orthopedic surgeries, paroxysmal atrial fibrillation, and COPD. Pt transferred here from [facility] where he was diagnosed with a DVT in his right popliteal vein and multiple bilateral pulmonary emboli. He was found to be in a flutter at the time of admission it [place].  Prior to admission pt reports ambulating with rolling walker short distances.  Pt and spouse reporting pt activity tolerance gradually diminishing over past 3 weeks.  Pt has a broken wheelchair at home and old RW in disrepair.  Acute PT to follow pt to improve functional mobility for return to home at baseline level of function.  Follow-up PT to be determined but probably will need HHPT consult.       PT Assessment  Patient needs continued PT services    Follow Up Recommendations  Home health PT;Supervision - Intermittent    Does the patient have the potential to tolerate intense rehabilitation      Barriers to Discharge None none    Equipment Recommendations  Rolling walker with 5" wheels    Recommendations for Other Services     Frequency Min 3X/week    Precautions / Restrictions Precautions Precautions: Fall Restrictions Weight Bearing Restrictions: No    Pertinent Vitals/Pain No c/o pain SpO2 sats >95 throughout session on Room air.        Mobility  Bed Mobility Bed Mobility: Supine to Sit;Sitting - Scoot to Edge of Bed Supine to Sit: 6: Modified independent (Device/Increase time);With rails;HOB elevated (pt has hospital bed at home.) Sitting - Scoot to Edge of Bed: 6: Modified independent (Device/Increase time);With rail Details for Bed Mobility Assistance: no  assistance required. Pt required extra effort with UEs to raise shoulders from bed secondary to core weakness.  Transfers Transfers: Sit to Stand;Stand to Sit Sit to Stand: 5: Supervision;With upper extremity assist;From bed Stand to Sit: 5: Supervision;To chair/3-in-1;With upper extremity assist Details for Transfer Assistance: close supervision for safety.  No physical assistance or verbal cueing required.   Ambulation/Gait Ambulation/Gait Assistance: 5: Supervision Ambulation Distance (Feet): 30 Feet Assistive device: Rolling walker Ambulation/Gait Assistance Details: VCs for upright trunk posture.  Pt reporting medial instability in bilateral knees.  No excessive genu valgus noted by PT during gait.  Pt does present with narrow BOS.   Gait Pattern: Step-through pattern;Decreased dorsiflexion - right;Decreased dorsiflexion - left;Narrow base of support;Trunk flexed Stairs: No Wheelchair Mobility Wheelchair Mobility: No    Exercises     PT Diagnosis: Difficulty walking;Generalized weakness  PT Problem List: Decreased strength;Decreased activity tolerance;Decreased mobility;Obesity;Cardiopulmonary status limiting activity PT Treatment Interventions: Gait training;Functional mobility training;Therapeutic exercise;Therapeutic activities;Neuromuscular re-education;Patient/family education   PT Goals Acute Rehab PT Goals PT Goal Formulation: With patient Time For Goal Achievement: 10/31/12 Potential to Achieve Goals: Good Pt will Transfer Bed to Chair/Chair to Bed: with modified independence PT Transfer Goal: Bed to Chair/Chair to Bed - Progress: Goal set today Pt will Ambulate: 51 - 150 feet;with supervision;with rolling walker PT Goal: Ambulate - Progress: Goal set today Pt will Go Up / Down Stairs: 1-2 stairs;with supervision;with rolling walker PT Goal: Up/Down Stairs - Progress: Goal set today  Visit Information  Last PT Received On: 10/17/12 Assistance Needed: +1     Subjective Data  Subjective: Agree to PT eval Patient Stated Goal: Be able to get around like before.    Prior Functioning  Home Living Lives With: Spouse Available Help at Discharge: Family;Available 24 hours/day Type of Home: House Home Access: Stairs to enter;Ramped entrance Entrance Stairs-Number of Steps: 1 Entrance Stairs-Rails: None Home Layout: One level Bathroom Shower/Tub: Walk-in Contractor: Handicapped height Bathroom Accessibility: Yes Home Adaptive Equipment: Walker - rolling (manual wheelchair is broken) Prior Function Level of Independence: Independent with assistive device(s) (walker ) Able to Take Stairs?: No Driving: No Vocation: On disability Communication Communication: No difficulties    Cognition  Cognition Overall Cognitive Status: Appears within functional limits for tasks assessed/performed Arousal/Alertness: Awake/alert Orientation Level: Oriented X4 / Intact;Appears intact for tasks assessed Behavior During Session: [facility] for tasks performed    Extremity/Trunk Assessment Right Upper Extremity Assessment RUE ROM/Strength/Tone: [facility] for tasks assessed Left Upper Extremity Assessment LUE ROM/Strength/Tone: Within functional levels Right Lower Extremity Assessment RLE ROM/Strength/Tone: Deficits RLE ROM/Strength/Tone Deficits: generalized weakness 4-/5 grossly RLE Sensation: WFL - Light Touch RLE Coordination: WFL - gross motor Left Lower Extremity Assessment LLE ROM/Strength/Tone: Deficits LLE ROM/Strength/Tone Deficits: generalized weakness 4-/5 grossly LLE Sensation: WFL - Light Touch   Balance Balance Balance Assessed: No (No LOB in static sitting or standing with UE support. )  End of Session PT - End of Session Equipment Utilized During Treatment: Gait belt Activity Tolerance: Patient limited by fatigue Patient left: in chair;with call bell/phone within reach Nurse Communication: Mobility status  GP Functional  Assessment Tool Used: clinical judgement Functional Limitation: Mobility: Walking and moving around Mobility: Walking and Moving Around Current Status ([id number]): At least 1 percent but less than 20 percent impaired, limited or restricted Mobility: Walking and Moving Around Goal Status [phone]): 0 percent impaired, limited or restricted   Alahia Whicker 10/17/2012, 12:51 PM Taji Barretto L. Marijayne Rauth DPT [phone]

## 2012-10-17 NOTE — Care Management Note (Signed)
    Page 1 of 2   10/18/2012     2:16:28 PM   CARE MANAGEMENT NOTE 10/18/2012  Patient:  Bryce Berry, Bryce Berry   Account Number:  [id number]  Date Initiated:  10/17/2012  Documentation initiated by:  AMERSON,JULIE  Subjective/Objective Assessment:   PT ADM WITH PULMONARY EMBOLUS ON 10/16/12.  PTA, PT RESIDES AT HOME WITH SPOUSE AND IS INDEPENDENT.     Action/Plan:   PT TO DC HOME ON XARELTO.  COPAY FOR DOSAGE NEEDED IS $51 AT A RETAIL PHARMACY.  WILL FOLLOW FOR HOME NEEDS AS PT PROGRESSES.   Anticipated DC Date:  10/18/2012   Anticipated DC Plan:  HOME W HOME HEALTH SERVICES      DC Planning Services  CM consult      [facility] Choice  HOME HEALTH   Choice offered to / List presented to:  C-1 Patient   DME arranged  Levan Hurst      DME agency  [facility]     [facility] arranged  HH-1 RN  HH-2 PT  HH-3 OT      [facility] agency  [facility]   Status of service:  Completed, signed off Medicare Important Message given?   (If response is "NO", the following Medicare IM given date fields will be blank) Date Medicare IM given:   Date Additional Medicare IM given:    Discharge Disposition:  HOME W HOME HEALTH SERVICES  Per UR Regulation:  Reviewed for med. necessity/level of care/duration of stay  If discussed at Long Length of Stay Meetings, dates discussed:    Comments:  10/18/12 Rosalita Chessman [phone] PT FOR DISCHARGE HOME TODAY.  NEEDS HH FOLLOW UP, ROLLING WALKER FOR HOME.  REFERRAL TO [facility], PER PT CHOICE, FOR HH AND DME NEEDS.  START OF CARE 24-48H POST DC DATE.

## 2012-10-17 NOTE — Progress Notes (Signed)
Patient Name: Bryce Berry Date of Encounter: 10/17/2012   Active Problems:   Atrial flutter   COPD (chronic obstructive pulmonary disease)   GERD (gastroesophageal reflux disease)   Hypertension   Dyslipidemia   CAD (coronary artery disease)   Peripheral arterial disease   Chronic kidney disease   History of lower GI bleeding   Right leg DVT   Bilateral pulmonary embolism   SUBJECTIVE  Pt presented on tx from [facility] today after presenting there over the weekend with progressive dyspnea and subsequently found to be in rapid atrial flutter.  He was rate controlled with dilt/bb and placed on anticoagulation.  D dimer was elevated and subsequent LE u/s showed R DVT, while V:Q scan (performed instead of CT 2/2 contrast allergy) was high prob for bilat PE.  He has been maintained on lovenox and consideration is being given to xarelto therapy pending insurance approval.  He is currently w/o complaint.  Denies CP or SOB today.  CURRENT MEDS . allopurinol  100 mg Oral Daily  . aspirin EC  81 mg Oral Daily  . diltiazem  240 mg Oral Daily  . fenofibrate  160 mg Oral Daily  . finasteride  5 mg Oral Daily  . furosemide  20 mg Oral Daily  . metoCLOPramide  5 mg Oral TID AC & HS  . metoprolol tartrate  100 mg Oral BID  . pantoprazole  40 mg Oral Daily  . rivaroxaban  15 mg Oral BID  . sodium chloride  3 mL Intravenous Q12H    OBJECTIVE  Filed Vitals:   10/16/12 1316 10/16/12 2028 10/17/12 0517  BP: 132/83 132/81 145/84  Pulse: 84 82 82  Temp: 98.2 F (36.8 C) 98.4 F (36.9 C) 98.3 F (36.8 C)  TempSrc: Oral Oral Oral  Resp: 18 20 20   Height: 6\' 2"  (1.88 m)    Weight: 290 lb 5.5 oz (131.7 kg)  289 lb 7.4 oz (131.3 kg)  SpO2: 99% 100% 99%    Intake/Output Summary (Last 24 hours) at 10/17/12 0811 Last data filed at 10/17/12 0700  Gross per 24 hour  Intake    240 ml  Output   1500 ml  Net  -1260 ml   Filed Weights   10/16/12 1316 10/17/12 0517  Weight: 290  lb 5.5 oz (131.7 kg) 289 lb 7.4 oz (131.3 kg)   PHYSICAL EXAM  General: Pleasant, NAD. obese Neuro: Alert and oriented X 3. Moves all extremities spontaneously. Psych: Normal affect. HEENT:  Normal  Neck: Supple without bruits or JVD. Lungs:  Resp regular and unlabored, CTA. Heart: RRR no s3, s4, or murmurs. Abdomen: Soft, non-tender, non-distended, BS + x 4.  Extremities: No clubbing, cyanosis or edema. DP/PT/Radials 2+ and equal bilaterally.  Accessory Clinical Findings  Labs from [facility] reviewed:  H/H  11.2/33.9; WBC  11.3;  PLT 107 Na 137, K 3.7, Cl 106, CO2 26, BUN 14, Creat 0.74, Gluc 100 D dimer 2.82  TELE  - sinus Radiology/Studies  *V:Q scan: High prob study for bilat PE. *R LE U/S: subocclusive dvt in the right popliteal vein.  No left sided dvt.  ASSESSMENT AND PLAN  1.  Bilateral Pulmonary Emboli:  In setting of RLE DVT.  Agree with ongoing anticoagulation.   Echo ordered this am. Pt would like to switch to xarelto. Will therefore stop lovenox and start xarelto 15mg  BID at this time.  Case management will assist with insurance coverage.    2.  RLE  DVT:  Anticoagulation as above.  3.  Afib/flutter:  He had recurrent atrial arrhythmias in setting of acute PE.  He is now in sinus rhythm.  Cont rate controlling agents.  CHADS2=1, CHA2DS2VASc=3.  4.  Colonic Diverticulosis with h/o brbpr:  He was being w/u by GI with colonoscopy pending.  He does not currently have brbpr.  With #1 & #2, anticoagulation is essential.  H/H stable.  Follow with anticoagulation.  Will follow with you.

## 2012-10-17 NOTE — Progress Notes (Signed)
[facility] PROGRESS NOTE  Bryce Berry ZOX:[id number] DOB: [birth year] DOA: 10/16/2012 PCP: Ignatius Specking., MD  Brief Narrative: Bryce Berry is a 72 y.o. [name] transferred here today from [facility]. His history is listed below and is significant for coronary artery disease status post 4 stents, multiple orthopedic surgeries, paroxysmal atrial fibrillation, COPD, blood per rectum in the past. At [facility] he was diagnosed with a DVT in his right popliteal vein and multiple bilateral pulmonary emboli. He was found to be in a flutter at the time of admission it [place].   Assessment/Plan: Bilateral PE and RLE DVT  - On Xeralto. Case management consult requested.  - will see his copay before further decisions.  Aflutter / with history of PAfib  - On Metoprolol and Cardizem  - [facility] on board.  - Currently in normal sinus rhythm.  Possible Pneumonia vs right lower lobe atelectasis  Need final sputum results from [facility] Gram stain showed moderate grm + cocci and moderate WBCs.  Will not continue rocephin for now.  Hypertension  Currently controlled on Metoprolol and Cardizem  High Cholesterol  Continue home medications (Fenofibrate)  H/o Gout  On Allopurinol  Recent history of falls per wife  PT eval requested. Has bilateral knee replacement and has used PT in the past at home.  Gastroparesis  Stable. Reglan  Gerd  Protonix  BRBPR  H/O alternating diarrhea and constipation - possibly hemorrhoids.  Monitor Hgb.  Guiac Stools. Morbid Obesity - counseled about weight loss today.   Code Status: presumed full Family Communication: wife bedside  Disposition Plan: home 1-2 days; PT to evaluate to determine home needs; wean off O2.  Consultants:  Cardiology  Procedures:  2D echo pending  Antibiotics:  none  HPI/Subjective: - no complaints this morning.   Objective: Filed Vitals:   10/16/12 1316 10/16/12 2028 10/17/12 0517   BP: 132/83 132/81 145/84  Pulse: 84 82 82  Temp: 98.2 F (36.8 C) 98.4 F (36.9 C) 98.3 F (36.8 C)  TempSrc: Oral Oral Oral  Resp: 18 20 20   Height: 6\' 2"  (1.88 m)    Weight: 131.7 kg (290 lb 5.5 oz)  131.3 kg (289 lb 7.4 oz)  SpO2: 99% 100% 99%    Intake/Output Summary (Last 24 hours) at 10/17/12 0831 Last data filed at 10/17/12 0700  Gross per 24 hour  Intake    240 ml  Output   1500 ml  Net  -1260 ml   Filed Weights   10/16/12 1316 10/17/12 0517  Weight: 131.7 kg (290 lb 5.5 oz) 131.3 kg (289 lb 7.4 oz)    Exam:   General:  NAD, morbidly obese AA male  Cardiovascular: regular rate and rhythm, without MRG  Respiratory: good air movement, clear to auscultation throughout, no wheezing, ronchi or rales  Abdomen: soft, not tender to palpation, positive bowel sounds  MSK: no peripheral edema  Neuro: CN 2-12 grossly intact, MS 5/5 in all 4  Data Reviewed: Basic Metabolic Panel:  Recent Labs Lab 10/17/12 0518  NA 137  K 4.1  CL 104  CO2 25  GLUCOSE 90  BUN 13  CREATININE 0.83  CALCIUM 9.4   CBC:  Recent Labs Lab 10/17/12 0518  WBC 9.4  HGB 11.2*  HCT 34.3*  MCV 87.9  PLT 131*   Studies: No results found.  Scheduled Meds: . allopurinol  100 mg Oral Daily  . aspirin EC  81 mg Oral Daily  . diltiazem  240  mg Oral Daily  . fenofibrate  160 mg Oral Daily  . finasteride  5 mg Oral Daily  . furosemide  20 mg Oral Daily  . metoCLOPramide  5 mg Oral TID AC & HS  . metoprolol tartrate  100 mg Oral BID  . pantoprazole  40 mg Oral Daily  . rivaroxaban  15 mg Oral BID  . sodium chloride  3 mL Intravenous Q12H   Continuous Infusions:   Active Problems:   Atrial flutter   COPD (chronic obstructive pulmonary disease)   GERD (gastroesophageal reflux disease)   Hypertension   Dyslipidemia   CAD (coronary artery disease)   Peripheral arterial disease   Chronic kidney disease   History of lower GI bleeding   Right leg DVT   Bilateral pulmonary  embolism  Pamella Pert, MD [facility] Pager [phone]. If 7 PM - 7 AM, please contact night-coverage at [email or web address], password [facility] 10/17/2012, 8:31 AM  LOS: 1 day

## 2012-10-17 NOTE — Progress Notes (Signed)
Utilization Review Completed.Dowell, Deborah T3/11/2012  

## 2012-10-17 NOTE — Progress Notes (Signed)
  Echocardiogram 2D Echocardiogram has been performed.  Bryce Berry, Bryce Berry 10/17/2012, 6:01 PM

## 2012-10-18 LAB — BASIC METABOLIC PANEL
CO2: 28 mEq/L (ref 19–32)
Chloride: 106 mEq/L (ref 96–112)
Potassium: 4.1 mEq/L (ref 3.5–5.1)
Sodium: 141 mEq/L (ref 135–145)

## 2012-10-18 LAB — CBC
MCV: 85.9 fL (ref 78.0–100.0)
Platelets: 150 10*3/uL (ref 150–400)
RBC: 3.97 MIL/uL — ABNORMAL LOW (ref 4.22–5.81)
WBC: 9 10*3/uL (ref 4.0–10.5)

## 2012-10-18 LAB — MAGNESIUM: Magnesium: 1.7 mg/dL (ref 1.5–2.5)

## 2012-10-18 NOTE — Evaluation (Signed)
Occupational Therapy Evaluation Patient Details Name: Bryce Berry MRN: [id number] DOB: May 24, 1941 Today's Date: 10/18/2012 Time: 4098-1191 OT Time Calculation (min): 38 min  OT Assessment / Plan / Recommendation Clinical Impression  Pt transferred from [facility] with DVT in his right popliteal vein and multiple bilateral pulmonary emboli. Hx significant for coronary artery disease status post 4 stents, multiple orthopedic surgeries, paroxysmal atrial fibrillation, COPD, blood per rectum in the past. Pt will benefit from continued OT services to address below problem list in prep for return home.     OT Assessment  Patient needs continued OT Services    Follow Up Recommendations  Home health OT;Supervision - Intermittent    Barriers to Discharge None    Equipment Recommendations   (tbd)    Recommendations for Other Services    Frequency  Min 2X/week    Precautions / Restrictions Precautions Precautions: Fall   Pertinent Vitals/Pain See vitals    ADL  Eating/Feeding: Performed;Independent Where Assessed - Eating/Feeding: Edge of bed Grooming: Performed;Wash/dry face;Supervision/safety Where Assessed - Grooming: Unsupported standing Upper Body Bathing: Simulated;Set up Where Assessed - Upper Body Bathing: Unsupported sitting Lower Body Bathing: Simulated;Moderate assistance Where Assessed - Lower Body Bathing: Unsupported sitting Upper Body Dressing: Set up;Performed Where Assessed - Upper Body Dressing: Unsupported sitting Lower Body Dressing: Simulated;Moderate assistance Where Assessed - Lower Body Dressing: Unsupported sitting Toilet Transfer: Simulated;Supervision/safety Toilet Transfer Method: Sit to Barista:  (bed to ambulate in room and return to bed) Equipment Used: Rolling walker;Gait belt Transfers/Ambulation Related to ADLs: close supervision with RW. Ambulated in room ~30 ft. ADL Comments: Pt requires mod assist at basline with ADLs  due to generalized chronic pain.  Pt has used AE in past but does not seem interested in using it at this time (may still benefit from AE review).  Pt reports sponge bathing for past year due to difficulty standing in shower.  Discussed use of shower chair with pt and wife.  Pt reports there is not enough room for in shower when he uses the chair due to his girth.       OT Diagnosis: Generalized weakness;Acute pain  OT Problem List: Decreased strength;Decreased activity tolerance;Decreased knowledge of use of DME or AE OT Treatment Interventions: Self-care/ADL training;DME and/or AE instruction;Therapeutic activities;Patient/family education   OT Goals Acute Rehab OT Goals OT Goal Formulation: With patient/family Time For Goal Achievement: 10/25/12 Potential to Achieve Goals: Good ADL Goals Pt Will Perform Grooming: with modified independence;Standing at sink ADL Goal: Grooming - Progress: Goal set today Pt Will Transfer to Toilet: with modified independence;with DME;Comfort height toilet;Ambulation ADL Goal: Toilet Transfer - Progress: Goal set today Pt Will Perform Toileting - Clothing Manipulation: with modified independence;Standing ADL Goal: Toileting - Clothing Manipulation - Progress: Goal set today Pt Will Perform Toileting - Hygiene: with modified independence;Sit to stand from 3-in-1/toilet ADL Goal: Toileting - Hygiene - Progress: Goal set today Miscellaneous OT Goals Miscellaneous OT Goal #1: Pt will be able to independently verbalize and demonstrate use of AE for LB ADLs in order to minimize BOC on wife and increase I with ADLs. OT Goal: Miscellaneous Goal #1 - Progress: Goal set today Miscellaneous OT Goal #2: Pt will independently state 3 energy conservation strategies to incorporate during ADL activity. OT Goal: Miscellaneous Goal #2 - Progress: Goal set today Miscellaneous OT Goal #3: Pt will perform functional mobility at mod I level. OT Goal: Miscellaneous Goal #3 -  Progress: Goal set today  Visit Information  Last OT  Received On: 10/18/12 Assistance Needed: +1    Subjective Data      Prior Functioning     Home Living Lives With: Spouse Available Help at Discharge: Family;Available 24 hours/day Type of Home: House Home Access: Stairs to enter;Ramped entrance Entrance Stairs-Number of Steps: 1 Entrance Stairs-Rails: None Home Layout: One level Bathroom Shower/Tub: Walk-in Contractor: Handicapped height Bathroom Accessibility: Yes Home Adaptive Equipment: Walker - rolling;Hospital bed (manual w/c is broken) Prior Function Level of Independence: Needs assistance (walker) Needs Assistance: Bathing;Dressing Bath: Minimal Dressing: Moderate Able to Take Stairs?: No Driving: No Vocation: On disability Communication Communication: No difficulties         Vision/Perception Vision - History Baseline Vision: Wears glasses only for reading   Cognition  Cognition Overall Cognitive Status: Appears within functional limits for tasks assessed/performed Arousal/Alertness: Awake/alert Orientation Level: Oriented X4 / Intact;Appears intact for tasks assessed Behavior During Session: [facility] for tasks performed    Extremity/Trunk Assessment Right Upper Extremity Assessment RUE ROM/Strength/Tone: [facility] for tasks assessed;Deficits RUE ROM/Strength/Tone Deficits: decreased shoulder ROM at baseline Left Upper Extremity Assessment LUE ROM/Strength/Tone: [facility] for tasks assessed     Mobility Bed Mobility Bed Mobility: Supine to Sit;Sitting - Scoot to Edge of Bed;Sit to Supine Supine to Sit: 6: Modified independent (Device/Increase time);HOB elevated;With rails Sitting - Scoot to Edge of Bed: 6: Modified independent (Device/Increase time) Sit to Supine: 6: Modified independent (Device/Increase time);HOB flat Details for Bed Mobility Assistance: increased time Transfers Transfers: Sit to Stand;Stand to Sit Sit to Stand: 5:  Supervision;From bed;With upper extremity assist;From elevated surface Stand to Sit: 6: Modified independent (Device/Increase time);To bed;With upper extremity assist Details for Transfer Assistance: required elevated bed to stand without assist.     Exercise     Balance     End of Session OT - End of Session Equipment Utilized During Treatment: Gait belt Activity Tolerance: Patient limited by fatigue Patient left: in bed;with call bell/phone within reach;with family/visitor present Nurse Communication: Mobility status  GO    10/18/2012 Cipriano Mile OTR/L Pager [phone] Office [phone]  Cipriano Mile 10/18/2012, 10:30 AM

## 2012-10-18 NOTE — Progress Notes (Addendum)
[facility] PROGRESS NOTE  Bryce Berry WNU:[id number] DOB: [birth year] DOA: 10/16/2012 PCP: Ignatius Specking., MD  Assessment/Plan: Active Problems:   Atrial flutter   COPD (chronic obstructive pulmonary disease)   GERD (gastroesophageal reflux disease)   Hypertension   Dyslipidemia   CAD (coronary artery disease)   Peripheral arterial disease   Chronic kidney disease   History of lower GI bleeding   Right leg DVT   Bilateral pulmonary embolism    Assessment/Plan:  Bilateral PE and RLE DVT  - On Xeralto. Switched to xarelto yesterday, currently 15 mg BID x 21 days for treatment of DVT/PE; then will resume 20 mg once daily thereafter for chronic anticoagulaton in setting of PAFib/AFlutter  Aflutter / with history of PAfib  - On Metoprolol and Cardizem  - [facility] on board.  - Has had recurrent atrial arrhythmias in setting of acute PE, now in sinus rhythm Last year EF of 50-55%, grade 1 diastolic heart failure, 2-D echo pending May need to increase dose of Lasix to 40 prior to discharge, will defer to cardiology  Possible Pneumonia vs right lower lobe atelectasis  Need final sputum results from [facility]. Gram stain showed moderate grm + cocci and moderate WBCs. , WBC count normal, afebrile, not hypoxic Will not continue rocephin for now.  Will repeat chest x-ray Ambulate and check pulse ox  Hypertension  Currently controlled on Metoprolol and Cardizem  High Cholesterol  Continue home medications (Fenofibrate)  H/o Gout  On Allopurinol  Recent history of falls per wife  PT eval requested. Has bilateral knee replacement and has used PT in the past at home.  Gastroparesis  Stable. Reglan  Gerd  Protonix  BRBPR  H/O alternating diarrhea and constipation - possibly hemorrhoids.  Monitor Hgb.  Guiac Stools.  Morbid Obesity  - counseled about weight loss today.   Code Status: presumed full  Family Communication: wife bedside  Disposition Plan: home  possibly tomorrow      Brief narrative: 72 y.o. [name] transferred here today from [facility]. His history is listed below and is significant for coronary artery disease status post 4 stents, multiple orthopedic surgeries, paroxysmal atrial fibrillation, COPD, program blood per rectum. At [facility] hospital he was diagnosed with a DVT in his right popliteal vein and multiple bilateral pulmonary emboli. He was found to be in a flutter at the time of admission it [place].  Per the patient, he noticed in January 2014 that he had become weak. He normally walks with a walker but had declined and now used a wheelchair. He developed bright red blood per rectum and was seeking clearance from his primary care physician for a colonoscopy with Dr. Renae Fickle. However, he was feeling so poorly with upper respiratory symptoms that Dr. Sherril Croon prescribed antibiotics. These continued to worsen until he began to spit up sputum with streaks of blood on Friday night February 28. His primary care physician sent him to [facility] emergency department who initially diagnosed him with bilateral pneumonia, with atrial flutter and admitted him. They placed him on Rocephin. His d-dimer was elevated, but he had an allergy to contrast dye so a CT angios not done. Doppler ultrasound completed on March 1 showed subocclusive DVT in the right popliteal vein and no DVT in the left lower extremity. VQ scan completed March 1 showed high probability for bilateral pulmonary emboli mentioning multiple large bilateral segmental and smaller subsegmental perfusion defects, most pronounced in the right upper lobe but also noted in the left  upper lobe and left lower lobe. Cardiology was consulted at [facility]. They recommended continuing Cardizem and the triple to keep his heart rate less than or equal to 110 beats per minute. The patient was placed on Lovenox and [facility] was being considered for long-term anticoagulation therapy. From the  notes it appears they're waiting on insurance approval.  He has had no recent surgery, and he denies any recent travel. He is rather sedentary and obese. He denies any history of clotting disorders in his family, and he has had no previous history of blood clots. As an aside, patient mentions that hehas treated with chronic antibiotics after his right hip replacement, from the notes it appears that this may be doxycycline. He has seen no) blood per rectum in his bowel movements over the past month. He is complaining of mild right-sided chest tightness.      Consultants:  Cardiology  Procedures:  None  Antibiotics:  None  HPI/Subjective: Reports that shortness of breath is improving, apparently had 16 beats of V. tach last night  Objective: Filed Vitals:   10/17/12 0517 10/17/12 1350 10/17/12 1946 10/18/12 0450  BP: 145/84 130/76 127/78 135/77  Pulse: 82 76 76 70  Temp: 98.3 F (36.8 C) 98.8 F (37.1 C) 98.3 F (36.8 C) 98.2 F (36.8 C)  TempSrc: Oral Oral Oral Oral  Resp: 20 18 20 22   Height:      Weight: 131.3 kg (289 lb 7.4 oz)   133.4 kg (294 lb 1.5 oz)  SpO2: 99% 96% 100% 97%    Intake/Output Summary (Last 24 hours) at 10/18/12 1119 Last data filed at 10/18/12 0600  Gross per 24 hour  Intake    960 ml  Output   1050 ml  Net    -90 ml    Exam: General: Well developed, well appearing 72 year old male in no acute distress.  Neck: Supple. JVD not elevated.  Lungs: Clear bilaterally to auscultation without wheezes, rales, or rhonchi. Breathing is unlabored.  Heart: RRR S1 S2 without murmurs, rubs, or gallops.  Abdomen: Soft, non-distended.  Extremities: No clubbing or cyanosis. No edema.  Neuro: Alert and oriented X 3. Moves all extremities spontaneously. No focal deficits. .    Data Reviewed: Basic Metabolic Panel:  Recent Labs Lab 10/17/12 0518 10/18/12 0625  NA 137 141  K 4.1 4.1  CL 104 106  CO2 25 28  GLUCOSE 90 87  BUN 13 13  CREATININE 0.83  0.82  CALCIUM 9.4 10.0  MG  --  1.7    Liver Function Tests: No results found for this basename: AST, ALT, ALKPHOS, BILITOT, PROT, ALBUMIN,  in the last 168 hours No results found for this basename: LIPASE, AMYLASE,  in the last 168 hours No results found for this basename: AMMONIA,  in the last 168 hours  CBC:  Recent Labs Lab 10/17/12 0518 10/18/12 0625  WBC 9.4 9.0  HGB 11.2* 11.3*  HCT 34.3* 34.1*  MCV 87.9 85.9  PLT 131* 150    Cardiac Enzymes: No results found for this basename: CKTOTAL, CKMB, CKMBINDEX, TROPONINI,  in the last 168 hours BNP (last 3 results) No results found for this basename: PROBNP,  in the last 8760 hours   CBG: No results found for this basename: GLUCAP,  in the last 168 hours  No results found for this or any previous visit (from the past 240 hour(s)).   Studies: No results found.  Scheduled Meds: . allopurinol  100 mg  Oral Daily  . aspirin EC  81 mg Oral Daily  . diltiazem  240 mg Oral Daily  . fenofibrate  160 mg Oral Daily  . finasteride  5 mg Oral Daily  . furosemide  20 mg Oral Daily  . metoCLOPramide  5 mg Oral TID AC & HS  . metoprolol tartrate  100 mg Oral BID  . pantoprazole  40 mg Oral Daily  . rivaroxaban  15 mg Oral BID  . sodium chloride  3 mL Intravenous Q12H   Continuous Infusions:   Active Problems:   Atrial flutter   COPD (chronic obstructive pulmonary disease)   GERD (gastroesophageal reflux disease)   Hypertension   Dyslipidemia   CAD (coronary artery disease)   Peripheral arterial disease   Chronic kidney disease   History of lower GI bleeding   Right leg DVT   Bilateral pulmonary embolism    Time spent: 40 minutes   [facility]  [facility] Pager [phone]. If 8PM-8AM, please contact night-coverage at [email or web address], password [facility] 10/18/2012, 11:19 AM  LOS: 2 days

## 2012-10-18 NOTE — Progress Notes (Signed)
     Patient: Bryce Berry Date of Encounter: 10/18/2012, 8:40 AM Admit date: 10/16/2012     Subjective  Mr. Dettman has no complaints this AM. He denies CP or palpitations. He reports his SOB is improving.   Objective  Physical Exam: Vitals: BP 135/77  Pulse 70  Temp(Src) 98.2 F (36.8 C) (Oral)  Resp 22  Ht 6\' 2"  (1.88 m)  Wt 294 lb 1.5 oz (133.4 kg)  BMI 37.74 kg/m2  SpO2 97% General: Well developed, well appearing 72 year old male in no acute distress. Neck: Supple. JVD not elevated. Lungs: Clear bilaterally to auscultation without wheezes, rales, or rhonchi. Breathing is unlabored. Heart: RRR S1 S2 without murmurs, rubs, or gallops.  Abdomen: Soft, non-distended. Extremities: No clubbing or cyanosis. No edema.   Neuro: Alert and oriented X 3. Moves all extremities spontaneously. No focal deficits.  Intake/Output:  Intake/Output Summary (Last 24 hours) at 10/18/12 0840 Last data filed at 10/18/12 0600  Gross per 24 hour  Intake    960 ml  Output   1050 ml  Net    -90 ml    Inpatient Medications:  . allopurinol  100 mg Oral Daily  . aspirin EC  81 mg Oral Daily  . diltiazem  240 mg Oral Daily  . fenofibrate  160 mg Oral Daily  . finasteride  5 mg Oral Daily  . furosemide  20 mg Oral Daily  . metoCLOPramide  5 mg Oral TID AC & HS  . metoprolol tartrate  100 mg Oral BID  . pantoprazole  40 mg Oral Daily  . rivaroxaban  15 mg Oral BID  . sodium chloride  3 mL Intravenous Q12H    Labs:  Recent Labs  10/17/12 0518 10/18/12 0625  WBC 9.4 9.0  HGB 11.2* 11.3*  HCT 34.3* 34.1*  MCV 87.9 85.9  PLT 131* 150    Recent Labs  10/17/12 0518  INR 1.05    Radiology/Studies: No results found.  Echocardiogram: report pending Telemetry: SR currently; one episode NSVT ~1:30 AM today, asymptomatic    Assessment and Plan  1. Bilateral pulmonary emboli - n setting of RLE DVT  Agree with ongoing anticoagulation.   Switched to xarelto yesterday, currently 15  mg BID x 21 days for treatment of DVT/PE; then will resume 20 mg once daily thereafter for chronic anticoagulaton in setting of PAFib/AFlutter 2. RLE DVT Anticoagulation as outlined above 3. AFib/flutter Has had recurrent atrial arrhythmias in setting of acute PE, now in sinus rhythm Continue rate control with metoprolol and diltiazem Continue chronic anticoagulation for stroke risk reduction/embolic prophylaxis  Review echo report when available  Dr. Johney Frame to see Signed, EDMISTEN, BROOKE PA-C  I have seen, examined the patient, and reviewed the above assessment and plan.  Changes to above are made where necessary.  Appears to be clinically stable.  Echo is reviewed.  LV function is normal.  PA pressures are elevated but RV function is normal.  Would continue xarelto 15mg  BID to complete 21 days then 20mg  daily thereafter. No further inpatient CV testing planned. OK to discharge from my standpoint. I will see in the office in 4-6 weeks.  Co Sign: Hillis Range, MD 10/18/2012 2:00 PM

## 2012-10-18 NOTE — Progress Notes (Signed)
PATIENT SLEEPING AND HAD 16 BEATS OF V-TACH RATE 160-170. R.N,. AWARE AND REVIEW CHART. KClearence Ped P.A. WAS MADE AWARE .

## 2012-10-19 MED ORDER — RIVAROXABAN 15 MG PO TABS: 15.0000 mg | ORAL_TABLET | Freq: Two times a day (BID) | ORAL | Status: DC

## 2012-10-19 MED ORDER — RIVAROXABAN 20 MG PO TABS: 20.0000 mg | ORAL_TABLET | Freq: Every day | ORAL | Status: DC

## 2012-10-19 MED ORDER — FUROSEMIDE 20 MG PO TABS: 40.0000 mg | ORAL_TABLET | Freq: Every day | ORAL | Status: DC

## 2012-10-19 NOTE — Discharge Summary (Signed)
Physician Discharge Summary  Bryce Berry MRN: [id number] DOB/AGE: 1940-10-14 72 y.o.  PCP: Ignatius Specking., Bryce Berry   Admit date: 10/16/2012 Discharge date: 10/19/2012  Discharge Diagnoses:      Atrial flutter   COPD (chronic obstructive pulmonary disease)   GERD (gastroesophageal reflux disease)   Hypertension   Dyslipidemia   CAD (coronary artery disease)   Peripheral arterial disease   Chronic kidney disease   History of lower GI bleeding   Right leg DVT   Bilateral pulmonary embolism     Medication List    TAKE these medications       allopurinol 100 MG tablet  Commonly known as:  ZYLOPRIM  Take 100 mg by mouth 2 (two) times daily.     ascorbic acid 500 MG tablet  Commonly known as:  VITAMIN C  Take 500 mg by mouth daily.     aspirin EC 81 MG tablet  Take 1 tablet (81 mg total) by mouth daily.     calcium-vitamin D 500-200 MG-UNIT per tablet  Commonly known as:  OSCAL WITH D  Take 1 tablet by mouth daily.     cilostazol 100 MG tablet  Commonly known as:  PLETAL  Take 100 mg by mouth daily.     colchicine 0.6 MG tablet  Take 0.6 mg by mouth daily as needed (gout flair up).     diltiazem 240 MG 24 hr capsule  Commonly known as:  CARDIZEM CD  Take 1 capsule (240 mg total) by mouth daily.     fenofibrate 145 MG tablet  Commonly known as:  TRICOR  Take 1 tablet (145 mg total) by mouth daily.     finasteride 5 MG tablet  Commonly known as:  PROSCAR  Take 5 mg by mouth daily.     Fish Oil 1000 MG Caps  Take 2 capsules by mouth daily.     furosemide 20 MG tablet  Commonly known as:  Bryce Berry  Take 2 tablets (40 mg total) by mouth daily.     HYDROcodone-acetaminophen 10-325 MG per tablet  Commonly known as:  NORCO  Take 1 tablet by mouth every 4 (four) hours as needed for pain.     meloxicam 7.5 MG tablet  Commonly known as:  MOBIC  Take 7.5 mg by mouth daily.     metoCLOPramide 10 MG tablet  Commonly known as:  REGLAN  Take 1 tablet (10 mg  total) by mouth daily.     metoprolol 100 MG tablet  Commonly known as:  LOPRESSOR  Take 1 tablet (100 mg total) by mouth 2 (two) times daily.     multivitamin tablet  Take 1 tablet by mouth daily.     MYLANTA 200-200-20 MG/5ML suspension  Generic drug:  alum & mag hydroxide-simeth  Take 15 mLs by mouth as needed for indigestion.     nitroGLYCERIN 0.4 MG SL tablet  Commonly known as:  NITROSTAT  Place 1 tablet (0.4 mg total) under the tongue every 5 (five) minutes as needed.     pantoprazole 40 MG tablet  Commonly known as:  PROTONIX  Take 40 mg by mouth daily.     potassium chloride SA 20 MEQ tablet  Commonly known as:  K-DUR,KLOR-CON  Take 1 tablet (20 mEq total) by mouth daily.     psyllium 28 % packet  Commonly known as:  METAMUCIL SMOOTH TEXTURE  Take 1 packet by mouth as needed (for constipation).     RA IRON 27  MG Tabs  Generic drug:  Ferrous Sulfate  Take 1 tablet by mouth daily.     Rivaroxaban 15 MG Tabs tablet  Commonly known as:  XARELTO  Take 1 tablet (15 mg total) by mouth 2 (two) times daily.     Rivaroxaban 20 MG Tabs  Commonly known as:  XARELTO  Take 1 tablet (20 mg total) by mouth daily.  Start taking on:  11/08/2012     rosuvastatin 40 MG tablet  Commonly known as:  CRESTOR  Take 1 tablet (40 mg total) by mouth every evening.     terazosin 5 MG capsule  Commonly known as:  HYTRIN  Take 5 mg by mouth every evening.        Discharge Condition: *Stable   Disposition: 06-Home-Health Care Svc   Consults:  Cardiology  Significant Diagnostic Studies: No results found.   2-D echo LV EF: 55% - 60%  ------------------------------------------------------------ Indications: Atrial flutter 427.32.  ------------------------------------------------------------ History: PMH: Coronary artery disease. Chronic obstructive pulmonary disease. Risk factors:  Hypertension. Dyslipidemia.  ------------------------------------------------------------ Study Conclusions  - Left ventricle: The cavity size was normal. There was mild concentric hypertrophy. Systolic function was normal. The estimated ejection fraction was in the Berry of 55% to 60%. - Mitral valve: Calcified annulus. - Left atrium: The atrium was mildly to moderately dilated. - Right atrium: The atrium was mildly dilated. - Pulmonary arteries: PA peak pressure: 48mm Hg (S).   Microbiology: No results found for this or any previous visit (from the past 240 hour(s)).   Labs: Results for orders placed during the hospital encounter of 10/16/12 (from the past 48 hour(s))  CBC     Status: Abnormal   Collection Time    10/18/12  6:25 AM      Result Value Berry   WBC 9.0  4.0 - 10.5 K/uL   RBC 3.97 (*) 4.22 - 5.81 MIL/uL   Hemoglobin 11.3 (*) 13.0 - 17.0 g/dL   HCT 16.1 (*) 09.6 - 04.5 %   MCV 85.9  78.0 - 100.0 fL   MCH 28.5  26.0 - 34.0 pg   MCHC 33.1  30.0 - 36.0 g/dL   RDW 40.9 (*) 81.1 - 91.4 %   Platelets 150  150 - 400 K/uL  BASIC METABOLIC PANEL     Status: Abnormal   Collection Time    10/18/12  6:25 AM      Result Value Berry   Sodium 141  135 - 145 mEq/L   Potassium 4.1  3.5 - 5.1 mEq/L   Chloride 106  96 - 112 mEq/L   CO2 28  19 - 32 mEq/L   Glucose, Bld 87  70 - 99 mg/dL   BUN 13  6 - 23 mg/dL   Creatinine, Ser 7.82  0.50 - 1.35 mg/dL   Calcium 95.6  8.4 - 21.3 mg/dL   GFR calc non Af Amer 88 (*) >90 mL/min   GFR calc Af Amer >90  >90 mL/min   Comment:            The eGFR has been calculated     using the CKD EPI equation.     This calculation has not been     validated in all clinical     situations.     eGFR's persistently     <90 mL/min signify     possible Chronic Kidney Disease.  MAGNESIUM     Status: None   Collection Time    10/18/12  6:25 AM      Result Value Berry   Magnesium 1.7  1.5 - 2.5 mg/dL     HPI :* Brief narrative:  72 y.o.  Bryce Berry transferred here today from [facility]. His history is listed below and is significant for coronary artery disease status post 4 stents, multiple orthopedic surgeries, paroxysmal atrial fibrillation, COPD, program blood per rectum. At [facility] hospital he was diagnosed with a DVT in his right popliteal vein and multiple bilateral pulmonary emboli. He was found to be in a flutter at the time of admission it Bryce Berry.  Per the patient, he noticed in January 2014 that he had become weak. He normally walks with a walker but had declined and now used a wheelchair. He developed bright red blood per rectum and was seeking clearance from his primary care physician for a colonoscopy with Bryce Berry. However, he was feeling so poorly with upper respiratory symptoms that Bryce Berry prescribed antibiotics. These continued to worsen until he began to spit up sputum with streaks of blood on Friday night February 28. His primary care physician sent him to [facility] emergency department who initially diagnosed him with bilateral pneumonia, with atrial flutter and admitted him. They placed him on Bryce Berry. His d-dimer was elevated, but he had an allergy to contrast dye so a CT angios not done. Doppler ultrasound completed on March 1 showed subocclusive DVT in the right popliteal vein and no DVT in the left lower extremity. VQ scan completed March 1 showed high probability for bilateral pulmonary emboli mentioning multiple large bilateral segmental and smaller subsegmental perfusion defects, most pronounced in the right upper lobe but also noted in the left upper lobe and left lower lobe. Cardiology was consulted at [facility]. They recommended continuing Cardizem and the triple to keep his heart rate less than or equal to 110 beats per minute. The patient was placed on Bryce Berry and Bryce Berry was being considered for long-term anticoagulation therapy. From the notes it appears they're waiting on insurance  approval.    HOSPITAL COURSE:  Bilateral PE and RLE DVT  - On Xeralto. Switched to Bryce Berry yesterday, currently 15 mg BID x 21 days for treatment of DVT/PE; then will resume 20 mg once daily thereafter for chronic anticoagulaton in setting of PAFib/AFlutter  Aflutter / with history of PAfib  - On Metoprolol and Cardizem  - Bryce Berry Cardiology saw the patient - Has had recurrent atrial arrhythmias in setting of acute PE, now in sinus rhythm   EF of 50-60%: grade 1 diastolic heart failure, Bryce Berry increased to 40 mg a day and follow up with cardiology in 2 months  Possible Pneumonia vs right lower lobe atelectasis  Initially there was a suspicion for pneumonia because of gram-positive stain from [facility] hospital however the patient did not have any leukocytosis or cough,  Hypertension  Currently controlled on Metoprolol and Cardizem   High Cholesterol  Continue home medications (Fenofibrate)   H/o Gout  On Allopurinol  Recent history of falls per wife  PT eval requested. Recommend home health. Has bilateral knee replacement and has used PT in the past at home.    Gastroparesis  Stable. Reglan  Gerd  Protonix  BRBPR  H/O alternating diarrhea and constipation - possibly hemorrhoids.  Monitor Hgb.  5 negative, repeat CBC in a month  Morbid Obesity  - counseled about weight loss today.     Discharge Exam:   Blood pressure 156/92, pulse 8, temperature 97.6 F (36.4 C), temperature source  Oral, resp. rate 20, height 6\' 2"  (1.88 m), weight 131.543 kg (290 lb), SpO2 97.00%.  General: Well developed, well appearing 72 year old male in no acute distress.  Neck: Supple. JVD not elevated.  Lungs: Clear bilaterally to auscultation without wheezes, rales, or rhonchi. Breathing is unlabored.  Heart: RRR S1 S2 without murmurs, rubs, or gallops.  Abdomen: Soft, non-distended.  Extremities: No clubbing or cyanosis. No edema.  Neuro: Alert and oriented X 3. Moves all extremities  spontaneously. No focal deficits.         Future Appointments Provider Department Dept Phone   10/30/2012 2:15 PM Bryce Hippo, Bryce Berry [facility] [phone]      Follow-up Information   Follow up with Bryce Range, Bryce Berry. Schedule an appointment as soon as possible for a visit in 2 months.   Contact information:   [address], [address] [place] [address] [phone]       Follow up with Bryce Berry,Bryce B., Bryce Berry. Schedule an appointment as soon as possible for a visit in 1 week.   Contact information:   [address] [place] [address] [phone]       Signed: Richarda Berry 10/19/2012, 8:05 AM

## 2012-10-30 ENCOUNTER — Ambulatory Visit (INDEPENDENT_AMBULATORY_CARE_PROVIDER_SITE_OTHER): Payer: Medicare Other | Admitting: Internal Medicine

## 2012-12-01 ENCOUNTER — Other Ambulatory Visit: Payer: Self-pay | Admitting: *Deleted

## 2012-12-01 MED ORDER — NITROGLYCERIN 0.4 MG SL SUBL: 0.4000 mg | SUBLINGUAL_TABLET | SUBLINGUAL | Status: DC | PRN

## 2013-02-27 ENCOUNTER — Telehealth (INDEPENDENT_AMBULATORY_CARE_PROVIDER_SITE_OTHER): Payer: Self-pay | Admitting: *Deleted

## 2013-02-27 ENCOUNTER — Encounter (HOSPITAL_COMMUNITY): Payer: Self-pay | Admitting: Pharmacy Technician

## 2013-02-27 ENCOUNTER — Other Ambulatory Visit (INDEPENDENT_AMBULATORY_CARE_PROVIDER_SITE_OTHER): Payer: Self-pay | Admitting: *Deleted

## 2013-02-27 DIAGNOSIS — Z1211 Encounter for screening for malignant neoplasm of colon: Secondary | ICD-10-CM

## 2013-02-27 DIAGNOSIS — K625 Hemorrhage of anus and rectum: Secondary | ICD-10-CM

## 2013-02-27 DIAGNOSIS — R197 Diarrhea, unspecified: Secondary | ICD-10-CM

## 2013-02-27 MED ORDER — PEG-KCL-NACL-NASULF-NA ASC-C 100 G PO SOLR: 1.0000 | Freq: Once | ORAL | Status: DC

## 2013-02-27 NOTE — Telephone Encounter (Signed)
agree

## 2013-02-27 NOTE — Telephone Encounter (Signed)
  Procedure: tcs  Reason/Indication:  Diarrhea, rectal bleeding  Has patient had this procedure before?  Yes, 2011  If so, when, by whom and where?    Is there a family history of colon cancer?  no  Who?  What age when diagnosed?    Is patient diabetic?   no      Does patient have prosthetic heart valve?  no  Do you have a pacemaker?  no  Has patient ever had endocarditis? no  Has patient had joint replacement within last 12 months?  no  Is patient on Coumadin, Plavix and/or Aspirin? yes  Medications: see EPIC  Allergies: see EPIC  Medication Adjustment: iron 10 days, asa 2 days, xarelto 2 days  Procedure date & time: 03/13/13 at 730

## 2013-03-13 ENCOUNTER — Encounter (HOSPITAL_COMMUNITY)
Admission: RE | Disposition: A | Discharge status: Home / Self Care | Payer: Self-pay | Source: Ambulatory Visit | Attending: Internal Medicine

## 2013-03-13 ENCOUNTER — Ambulatory Visit (HOSPITAL_COMMUNITY)
Admission: RE | Admit: 2013-03-13 | Discharge: 2013-03-13 | Disposition: A | Discharge status: Home / Self Care | Payer: Medicare Other | Source: Ambulatory Visit | Attending: Internal Medicine | Admitting: Internal Medicine

## 2013-03-13 ENCOUNTER — Encounter (HOSPITAL_COMMUNITY): Payer: Self-pay

## 2013-03-13 DIAGNOSIS — Z79899 Other long term (current) drug therapy: Secondary | ICD-10-CM | POA: Insufficient documentation

## 2013-03-13 DIAGNOSIS — Z7901 Long term (current) use of anticoagulants: Secondary | ICD-10-CM | POA: Insufficient documentation

## 2013-03-13 DIAGNOSIS — Z860100 Personal history of colon polyps, unspecified: Secondary | ICD-10-CM | POA: Insufficient documentation

## 2013-03-13 DIAGNOSIS — N189 Chronic kidney disease, unspecified: Secondary | ICD-10-CM | POA: Insufficient documentation

## 2013-03-13 DIAGNOSIS — K5730 Diverticulosis of large intestine without perforation or abscess without bleeding: Secondary | ICD-10-CM | POA: Insufficient documentation

## 2013-03-13 DIAGNOSIS — M1990 Unspecified osteoarthritis, unspecified site: Secondary | ICD-10-CM | POA: Insufficient documentation

## 2013-03-13 DIAGNOSIS — F4310 Post-traumatic stress disorder, unspecified: Secondary | ICD-10-CM | POA: Insufficient documentation

## 2013-03-13 DIAGNOSIS — M810 Age-related osteoporosis without current pathological fracture: Secondary | ICD-10-CM | POA: Insufficient documentation

## 2013-03-13 DIAGNOSIS — M109 Gout, unspecified: Secondary | ICD-10-CM | POA: Insufficient documentation

## 2013-03-13 DIAGNOSIS — D126 Benign neoplasm of colon, unspecified: Secondary | ICD-10-CM | POA: Insufficient documentation

## 2013-03-13 DIAGNOSIS — Z7982 Long term (current) use of aspirin: Secondary | ICD-10-CM | POA: Insufficient documentation

## 2013-03-13 DIAGNOSIS — K625 Hemorrhage of anus and rectum: Secondary | ICD-10-CM | POA: Insufficient documentation

## 2013-03-13 DIAGNOSIS — Z882 Allergy status to sulfonamides status: Secondary | ICD-10-CM | POA: Insufficient documentation

## 2013-03-13 DIAGNOSIS — E785 Hyperlipidemia, unspecified: Secondary | ICD-10-CM | POA: Insufficient documentation

## 2013-03-13 DIAGNOSIS — Z86718 Personal history of other venous thrombosis and embolism: Secondary | ICD-10-CM | POA: Insufficient documentation

## 2013-03-13 DIAGNOSIS — J438 Other emphysema: Secondary | ICD-10-CM | POA: Insufficient documentation

## 2013-03-13 DIAGNOSIS — Z91040 Latex allergy status: Secondary | ICD-10-CM | POA: Insufficient documentation

## 2013-03-13 DIAGNOSIS — K219 Gastro-esophageal reflux disease without esophagitis: Secondary | ICD-10-CM | POA: Insufficient documentation

## 2013-03-13 DIAGNOSIS — K648 Other hemorrhoids: Secondary | ICD-10-CM

## 2013-03-13 DIAGNOSIS — Z86711 Personal history of pulmonary embolism: Secondary | ICD-10-CM | POA: Insufficient documentation

## 2013-03-13 DIAGNOSIS — Z9861 Coronary angioplasty status: Secondary | ICD-10-CM | POA: Insufficient documentation

## 2013-03-13 DIAGNOSIS — I129 Hypertensive chronic kidney disease with stage 1 through stage 4 chronic kidney disease, or unspecified chronic kidney disease: Secondary | ICD-10-CM | POA: Insufficient documentation

## 2013-03-13 DIAGNOSIS — Z880 Allergy status to penicillin: Secondary | ICD-10-CM | POA: Insufficient documentation

## 2013-03-13 DIAGNOSIS — K589 Irritable bowel syndrome without diarrhea: Secondary | ICD-10-CM | POA: Insufficient documentation

## 2013-03-13 DIAGNOSIS — I2510 Atherosclerotic heart disease of native coronary artery without angina pectoris: Secondary | ICD-10-CM | POA: Insufficient documentation

## 2013-03-13 DIAGNOSIS — Z791 Long term (current) use of non-steroidal anti-inflammatories (NSAID): Secondary | ICD-10-CM | POA: Insufficient documentation

## 2013-03-13 DIAGNOSIS — I739 Peripheral vascular disease, unspecified: Secondary | ICD-10-CM | POA: Insufficient documentation

## 2013-03-13 DIAGNOSIS — N400 Enlarged prostate without lower urinary tract symptoms: Secondary | ICD-10-CM | POA: Insufficient documentation

## 2013-03-13 DIAGNOSIS — Z9049 Acquired absence of other specified parts of digestive tract: Secondary | ICD-10-CM | POA: Insufficient documentation

## 2013-03-13 DIAGNOSIS — R197 Diarrhea, unspecified: Secondary | ICD-10-CM

## 2013-03-13 DIAGNOSIS — Z87891 Personal history of nicotine dependence: Secondary | ICD-10-CM | POA: Insufficient documentation

## 2013-03-13 HISTORY — PX: COLONOSCOPY: SHX5424

## 2013-03-13 SURGERY — COLONOSCOPY
Anesthesia: Moderate Sedation

## 2013-03-13 MED ORDER — MEPERIDINE HCL 50 MG/ML IJ SOLN
INTRAMUSCULAR | Status: DC | PRN
  Administered 2013-03-13 (×2): 25 mg via INTRAVENOUS

## 2013-03-13 MED ORDER — MIDAZOLAM HCL 5 MG/5ML IJ SOLN
INTRAMUSCULAR | Status: AC
  Filled 2013-03-13: qty 10

## 2013-03-13 MED ORDER — PSYLLIUM 28 % PO PACK: 1.0000 | PACK | Freq: Every day | ORAL | Status: DC

## 2013-03-13 MED ORDER — STERILE WATER FOR IRRIGATION IR SOLN
Status: DC | PRN
  Administered 2013-03-13: 08:00:00

## 2013-03-13 MED ORDER — MEPERIDINE HCL 50 MG/ML IJ SOLN
INTRAMUSCULAR | Status: AC
  Filled 2013-03-13: qty 1

## 2013-03-13 MED ORDER — SODIUM CHLORIDE 0.9 % IV SOLN
INTRAVENOUS | Status: DC
  Administered 2013-03-13: 08:00:00 via INTRAVENOUS

## 2013-03-13 MED ORDER — MIDAZOLAM HCL 5 MG/5ML IJ SOLN
INTRAMUSCULAR | Status: DC | PRN
  Administered 2013-03-13 (×2): 2 mg via INTRAVENOUS

## 2013-03-13 NOTE — Op Note (Signed)
COLONOSCOPY PROCEDURE REPORT  PATIENT:  Bryce Berry  MR#:  [id number] Birthdate:  1941/06/24, 72 y.o., male Endoscopist:  Dr. Malissa Hippo, MD Referred By:  Dr. Ignatius Specking, MD Procedure Date: 03/13/2013  Procedure:   Colonoscopy  Indications: Patient is 72 year old African American male with multiple medical problems who presents with recurrent rectal bleeding. His last colonoscopy was about 3 years ago.  Informed Consent:  The procedure and risks were reviewed with the patient and informed consent was obtained.  Medications:  Demerol 50 mg IV Versed 4 mg IV  Description of procedure:  After a digital rectal exam was performed, that colonoscope was advanced from the anus through the rectum and colon to the area of the cecum, ileocecal valve and appendiceal orifice. The cecum was deeply intubated. These structures were well-seen and photographed for the record. From the level of the cecum and ileocecal valve, the scope was slowly and cautiously withdrawn. The mucosal surfaces were carefully surveyed utilizing scope tip to flexion to facilitate fold flattening as needed. The scope was pulled down into the rectum where a thorough exam including retroflexion was performed.  Findings:   Prep satisfactory. Scattered diverticula throughout the colon. 4 mm polyp cold snare from transverse colon. Wide-open colonic anastomosis located at 28 cm from the anal margin. Normal rectal mucosa. Small hemorrhoids above the dentate line.   Therapeutic/Diagnostic Maneuvers Performed:  See above  Complications:  None  Cecal Withdrawal Time:  9 minutes  Impression:  Examination performed to cecum. Pancolonic diverticulosis. 4 mm polyp cold snare from transverse colon. Internal hemorrhoids. Suspect he bleeds intermittently from hemorrhoids and or diverticulosis.  Recommendations:  Standard instructions given. Patient will resume xeralto starting this evening. If possible he should  discontinue meloxicam. Metamucil 4 g by mouth daily in addition to high-fiber diet. I will contact patient with biopsy results.  [name]  03/13/2013 8:14 AM  CC: Dr. Ignatius Specking., MD & Dr. Bonnetta Barry ref. provider found

## 2013-03-13 NOTE — H&P (Signed)
Bryce Berry is an 72 y.o. male.   Chief Complaint: Patient's here for colonoscopy. HPI: Patient is 72 year old African male with multiple medical problems who presents with recurrent rectal bleeding. He is at least episodes each month. Feels his bowel movements. He has or constipation. Patient's last colonoscopy was over 3 years ago. He is status post resection of sigmoid colon for diverticulitis or 20 years ago. He also has history of colonic polyps but  these cannot be located. Patient is on xeralto and also is on meloxicam. Family history is negative for CRC.  Past Medical History  Diagnosis Date  . Atrial fibrillation     Postop hip surgery, January 2012, digoxin and diltiazem, reverted to sinus in the hospital  . Osteoarthritis   . COPD (chronic obstructive pulmonary disease)     Emphysema  . Depression   . Posttraumatic stress disorder   . GERD (gastroesophageal reflux disease)   . Irritable bowel   . BPH (benign prostatic hyperplasia)   . Urinary tract bacterial infections   . Osteoporosis   . Hypertension   . Dyslipidemia   . CAD (coronary artery disease)     Stents placed in [place] and [place] in the past  /Myoview 2008, no ischemia, /   . Atrial flutter     History of the past with syncope and  ?? H. and consideration, no records  . 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 Hip pain   . Shoulder pain   . Gout   . Peripheral arterial disease     Dopplers Dr.Vyas office October, 2010, apparent occlusion anterior tibial arteries bilaterally., findings suggest hemodynamically significant stenosis of the proximal right provisional femoral artery and the mid left superficial artery.  Ankle-brachial indices indicate moderate stenosis within both lower extremities  . Chronic kidney disease     Creatinine 2.1, October 13, 2010--> 0.74 10/2012  . Hyperlipidemia     Mixed  . GI bleed   . Ejection fraction     EF 50-55%, echo, May, 2013, severe inferior hypokinesis  .  Bilateral pulmonary embolism     a. 10/2012 - V:Q scan high prob for Bilat PE in setting of dyspnea/hemoptysis-->anticoagulation started.  . Right leg DVT     a. 10/2012 - subocclusive DVT R popliteal vein.    Past Surgical History  Procedure Laterality Date  . Knee arthroplasty      Right  . Total knee arthroplasty      Left  . Total hip arthroplasty      Right-complicated hx  . Colon resection    . Back surgery      [place]  . Prostate surgery      Family History  Problem Relation Age of Onset  . Diabetes Daughter   . Hypertension Daughter   . Obesity Daughter    Social History:  reports that he quit smoking about 6 years ago. His smoking use included Cigarettes. He has a 30 pack-year smoking history. He has never used smokeless tobacco. He reports that he does not drink alcohol or use illicit drugs.  Allergies:  Allergies  Allergen Reactions  . Iodine     REACTION: rash  . Penicillins     Rapid heart rate; flushed  . Sulfonamide Derivatives     Rapid heart rate; flushed    Medications Prior to Admission  Medication Sig Dispense Refill  . allopurinol (ZYLOPRIM) 100 MG tablet Take 100 mg by mouth 2 (two) times daily.       Marland Kitchen  alum & mag hydroxide-simeth (MYLANTA) 200-200-20 MG/5ML suspension Take 15 mLs by mouth as needed for indigestion.       Marland Kitchen ascorbic acid (VITAMIN C) 500 MG tablet Take 500 mg by mouth daily.       Marland Kitchen aspirin EC 81 MG tablet Take 81 mg by mouth daily.      . calcium-vitamin D (OSCAL WITH D) 500-200 MG-UNIT per tablet Take 1 tablet by mouth daily.       . cilostazol (PLETAL) 100 MG tablet Take 100 mg by mouth daily.        . colchicine 0.6 MG tablet Take 0.6 mg by mouth daily as needed (gout flair up).       . diltiazem (CARDIZEM CD) 240 MG 24 hr capsule Take 1 capsule (240 mg total) by mouth daily.  90 capsule  3  . fenofibrate (TRICOR) 145 MG tablet Take 1 tablet (145 mg total) by mouth daily.  30 tablet  6  . Ferrous Sulfate (RA IRON) 27 MG  TABS Take 1 tablet by mouth daily.        . finasteride (PROSCAR) 5 MG tablet Take 5 mg by mouth daily.        . furosemide (LASIX) 20 MG tablet Take 2 tablets (40 mg total) by mouth daily.  90 tablet  3  . HYDROcodone-acetaminophen (NORCO) 10-325 MG per tablet Take 1 tablet by mouth every 4 (four) hours as needed for pain.       . meloxicam (MOBIC) 7.5 MG tablet Take 7.5 mg by mouth daily.      . metoCLOPramide (REGLAN) 10 MG tablet Take 1 tablet (10 mg total) by mouth daily.  30 tablet  5  . metoprolol (LOPRESSOR) 100 MG tablet Take 1 tablet (100 mg total) by mouth 2 (two) times daily.  180 tablet  3  . Multiple Vitamin (MULTIVITAMIN) tablet Take 1 tablet by mouth daily.        . Omega-3 Fatty Acids (FISH OIL) 1000 MG CAPS Take 2 capsules by mouth daily.       . pantoprazole (PROTONIX) 40 MG tablet Take 40 mg by mouth daily.        . peg 3350 powder (MOVIPREP) 100 G SOLR Take 1 kit (100 g total) by mouth once.  1 kit  0  . potassium chloride SA (K-DUR,KLOR-CON) 20 MEQ tablet Take 1 tablet (20 mEq total) by mouth daily.  90 tablet  3  . Rivaroxaban (XARELTO) 20 MG TABS Take 1 tablet (20 mg total) by mouth daily.  30 tablet  3  . rosuvastatin (CRESTOR) 40 MG tablet Take 1 tablet (40 mg total) by mouth every evening.  90 tablet  3  . terazosin (HYTRIN) 5 MG capsule Take 5 mg by mouth every evening.       . nitroGLYCERIN (NITROSTAT) 0.4 MG SL tablet Place 1 tablet (0.4 mg total) under the tongue every 5 (five) minutes as needed.  25 tablet  3    No results found for this or any previous visit (from the past 48 hour(s)). No results found.  ROS  Blood pressure 135/85, pulse 75, temperature 98 F (36.7 C), temperature source Oral, resp. rate 11, height 6\' 2"  (1.88 m), weight 298 lb (135.172 kg), SpO2 97.00%. Physical Exam  Constitutional: He appears well-developed and well-nourished.  HENT:  Mouth/Throat: Oropharynx is clear and moist.  Eyes: Conjunctivae are normal. No scleral icterus.   Neck: No thyromegaly present.  Cardiovascular: Normal rate, regular rhythm  and normal heart sounds.   No murmur heard. Respiratory: Effort normal and breath sounds normal.  GI: Soft. He exhibits no distension and no mass. There is no tenderness.  Musculoskeletal: Edema: 1+ edema around ankles.  Lymphadenopathy:    He has no cervical adenopathy.  Neurological: He is alert.  Skin: Skin is warm and dry.     Assessment/Plan Recurrent rectal bleeding. Irregular bowel movements. Diagnostic colonoscopy.  REHMAN,NAJEEB U 03/13/2013, 7:43 AM

## 2013-03-14 ENCOUNTER — Encounter (HOSPITAL_COMMUNITY): Payer: Self-pay | Admitting: Internal Medicine

## 2013-03-19 ENCOUNTER — Encounter (INDEPENDENT_AMBULATORY_CARE_PROVIDER_SITE_OTHER): Payer: Self-pay | Admitting: *Deleted

## 2013-11-02 ENCOUNTER — Other Ambulatory Visit: Payer: Self-pay | Admitting: *Deleted

## 2013-11-02 ENCOUNTER — Ambulatory Visit (INDEPENDENT_AMBULATORY_CARE_PROVIDER_SITE_OTHER): Payer: Medicare Other | Admitting: Cardiology

## 2013-11-02 ENCOUNTER — Encounter: Payer: Self-pay | Admitting: Cardiology

## 2013-11-02 VITALS — BP 117/80 | HR 85 | Ht 74.5 in | Wt 296.0 lb

## 2013-11-02 DIAGNOSIS — I2699 Other pulmonary embolism without acute cor pulmonale: Secondary | ICD-10-CM

## 2013-11-02 DIAGNOSIS — Z7901 Long term (current) use of anticoagulants: Secondary | ICD-10-CM

## 2013-11-02 DIAGNOSIS — E785 Hyperlipidemia, unspecified: Secondary | ICD-10-CM

## 2013-11-02 DIAGNOSIS — I2510 Atherosclerotic heart disease of native coronary artery without angina pectoris: Secondary | ICD-10-CM

## 2013-11-02 DIAGNOSIS — Z9229 Personal history of other drug therapy: Secondary | ICD-10-CM | POA: Insufficient documentation

## 2013-11-02 DIAGNOSIS — I4892 Unspecified atrial flutter: Secondary | ICD-10-CM

## 2013-11-02 DIAGNOSIS — I10 Essential (primary) hypertension: Secondary | ICD-10-CM

## 2013-11-02 DIAGNOSIS — I4891 Unspecified atrial fibrillation: Secondary | ICD-10-CM

## 2013-11-02 MED ORDER — FUROSEMIDE 20 MG PO TABS: 40.0000 mg | ORAL_TABLET | Freq: Every day | ORAL | Status: DC

## 2013-11-02 NOTE — Assessment & Plan Note (Signed)
Patient is receiving appropriate statin therapy. No change in therapy.

## 2013-11-02 NOTE — Assessment & Plan Note (Signed)
Patient is anticoagulated for his history of pulmonary emboli.  The patient's overall cardiac status is stable. We need to check CBC and chemistries and thyroid functions. Copies will be sent to his primary physician. As part of today's evaluation I spent greater than 25 minutes with his total care. More than half of this time has been with direct contact with the patient and his wife discussing all of his medical issues.

## 2013-11-02 NOTE — Assessment & Plan Note (Signed)
Patient had coronary emboli in March, 2014. He is remaining on anticoagulation.

## 2013-11-02 NOTE — Progress Notes (Signed)
Patient ID: Bryce Berry, male   DOB: [birth year], 73 y.o.   MRN: [id number]    HPI  Patient is seen today for followup history of coronary disease and atrial fibrillation. I had seen him last February, 2014. I have reviewed the hospital records since that time. In February, 2014 he had pulmonary emboli. He has been treated with Xarelto and he has been stable. He's not having any significant chest pain. He's not having any significant palpitations.  The patient's wife mentions that time she has increased edema. She has given him some extra Lasix doses and he responds well.  Allergies  Allergen Reactions  . Iodine     REACTION: rash  . Penicillins     Rapid heart rate; flushed  . Sulfonamide Derivatives     Rapid heart rate; flushed    Current Outpatient Prescriptions  Medication Sig Dispense Refill  . allopurinol (ZYLOPRIM) 100 MG tablet Take 100 mg by mouth 2 (two) times daily.       Marland Kitchen alum & mag hydroxide-simeth (MYLANTA) 956-387-56 MG/5ML suspension Take 15 mLs by mouth as needed for indigestion.       Marland Kitchen ascorbic acid (VITAMIN C) 500 MG tablet Take 500 mg by mouth daily.       Marland Kitchen aspirin EC 81 MG tablet Take 81 mg by mouth daily.      . calcium-vitamin D (OSCAL WITH D) 500-200 MG-UNIT per tablet Take 1 tablet by mouth daily.       . cilostazol (PLETAL) 100 MG tablet Take 100 mg by mouth daily.        . colchicine 0.6 MG tablet Take 0.6 mg by mouth daily as needed (gout flair up).       . diltiazem (CARDIZEM CD) 240 MG 24 hr capsule Take 1 capsule (240 mg total) by mouth daily.  90 capsule  3  . fenofibrate (TRICOR) 145 MG tablet Take 1 tablet (145 mg total) by mouth daily.  30 tablet  6  . Ferrous Sulfate (RA IRON) 27 MG TABS Take 1 tablet by mouth daily.        . finasteride (PROSCAR) 5 MG tablet Take 5 mg by mouth daily.        . furosemide (LASIX) 20 MG tablet Take 2 tablets (40 mg total) by mouth daily.  90 tablet  3  . HYDROcodone-acetaminophen (NORCO) 10-325 MG per tablet  Take 1 tablet by mouth every 4 (four) hours as needed for pain.       . meloxicam (MOBIC) 7.5 MG tablet Take 7.5 mg by mouth daily.      . metoCLOPramide (REGLAN) 10 MG tablet Take 1 tablet (10 mg total) by mouth daily.  30 tablet  5  . metoprolol (LOPRESSOR) 100 MG tablet Take 1 tablet (100 mg total) by mouth 2 (two) times daily.  180 tablet  3  . Multiple Vitamin (MULTIVITAMIN) tablet Take 1 tablet by mouth daily.        . nitroGLYCERIN (NITROSTAT) 0.4 MG SL tablet Place 1 tablet (0.4 mg total) under the tongue every 5 (five) minutes as needed.  25 tablet  3  . Omega-3 Fatty Acids (FISH OIL) 1000 MG CAPS Take 2 capsules by mouth daily.       . pantoprazole (PROTONIX) 40 MG tablet Take 40 mg by mouth daily.        . potassium chloride SA (K-DUR,KLOR-CON) 20 MEQ tablet Take 1 tablet (20 mEq total) by mouth daily.  90 tablet  3  . psyllium (METAMUCIL SMOOTH TEXTURE) 28 % packet Take 1 packet by mouth at bedtime.      . Rivaroxaban (XARELTO) 20 MG TABS Take 1 tablet (20 mg total) by mouth daily.  30 tablet  3  . rosuvastatin (CRESTOR) 40 MG tablet Take 1 tablet (40 mg total) by mouth every evening.  90 tablet  3  . tamsulosin (FLOMAX) 0.4 MG CAPS capsule Take 0.4 mg by mouth daily after supper.      . [DISCONTINUED] solifenacin (VESICARE) 10 MG tablet Take 10 mg by mouth daily.        No current facility-administered medications for this visit.    History   Social History  . Marital Status: Married    Spouse Name: N/A    Number of Children: N/A  . Years of Education: N/A   Occupational History  . Tour manager     Retired  . Vet     Retired   Social History Main Topics  . Smoking status: Former Smoker -- 1.00 packs/day for 30 years    Types: Cigarettes    Quit date: 08/16/2006  . Smokeless tobacco: Never Used     Comment: Patient smoked a pack a day for about 30 years  . Alcohol Use: No  . Drug Use: No  . Sexual Activity: Not on file   Other Topics Concern  . Not on file    Social History Narrative   Lives with wife at home.    Family History  Problem Relation Age of Onset  . Diabetes Daughter   . Hypertension Daughter   . Obesity Daughter     Past Medical History  Diagnosis Date  . Atrial fibrillation     Postop hip surgery, January 2012, digoxin and diltiazem, reverted to sinus in the hospital  . Osteoarthritis   . COPD (chronic obstructive pulmonary disease)     Emphysema  . Depression   . Posttraumatic stress disorder   . GERD (gastroesophageal reflux disease)   . Irritable bowel   . BPH (benign prostatic hyperplasia)   . Urinary tract bacterial infections   . Osteoporosis   . Hypertension   . Dyslipidemia   . CAD (coronary artery disease)     Stents placed in [place] and [place] in the past  /Myoview 2008, no ischemia, /   . Atrial flutter     History of the past with syncope and  ?? H. and consideration, no records  . 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 Hip pain   . Shoulder pain   . Gout   . Peripheral arterial disease     Dopplers Dr.Vyas office October, 2010, apparent occlusion anterior tibial arteries bilaterally., findings suggest hemodynamically significant stenosis of the proximal right provisional femoral artery and the mid left superficial artery.  Ankle-brachial indices indicate moderate stenosis within both lower extremities  . Chronic kidney disease     Creatinine 2.1, October 13, 2010--> 0.74 10/2012  . Hyperlipidemia     Mixed  . GI bleed   . Ejection fraction     EF 50-55%, echo, May, 2013, severe inferior hypokinesis  . Bilateral pulmonary embolism     a. 10/2012 - V:Q scan high prob for Bilat PE in setting of dyspnea/hemoptysis-->anticoagulation started.  . Right leg DVT     a. 10/2012 - subocclusive DVT R popliteal vein.    Past Surgical History  Procedure Laterality Date  . Knee arthroplasty  Right  . Total knee arthroplasty      Left  . Total hip arthroplasty      Right-complicated hx   . Colon resection    . Back surgery      [place]  . Prostate surgery    . Colonoscopy N/A 03/13/2013    Procedure: COLONOSCOPY;  Surgeon: Rogene Houston, MD;  Location: [facility];  Service: Endoscopy;  Laterality: N/A;  730    Patient Active Problem List   Diagnosis Date Noted  . Right leg DVT   . Bilateral pulmonary embolism   . Ejection fraction   . Anemia 03/01/2012  . History of lower GI bleeding 11/01/2011  . Gastroparesis 07/13/2011  . Constipation 07/13/2011  . Atrial fibrillation   . Renal insufficiency   . COPD (chronic obstructive pulmonary disease)   . Posttraumatic stress disorder   . GERD (gastroesophageal reflux disease)   . Hypertension   . Dyslipidemia   . CAD (coronary artery disease)   . Atrial flutter   . Gout   . Peripheral arterial disease   . Chronic kidney disease   . RENAL INSUFFICIENCY 10/19/2010  . Atrial flutter 06/20/2009  . PERIPHERAL VASCULAR DISEASE 06/20/2009  . HYPERLIPIDEMIA-MIXED 05/09/2009  . HYPERTENSION, UNSPECIFIED 05/09/2009  . OTH COMPLICATIONS DUE INTERNAL JOINT PROSTHESIS 04/01/2009  . 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 SHOULDER PAIN 09/25/2007  . HIP PAIN 09/25/2007  . BACK PAIN 09/25/2007  . IMPINGEMENT SYNDROME 09/25/2007    ROS   Patient denies fever, chills, headache, sweats, rash, change in vision, change in hearing, chest pain, cough, nausea or vomiting, urinary symptoms. All other systems are reviewed and are negative.  PHYSICAL EXAM Patient is significantly overweight. He is in a wheelchair. He is here with his wife. He is oriented to person time and place. Affect is normal. There is no jugulovenous distention. Lungs are clear. Respiratory effort is nonlabored. Cardiac exam reveals S1-S2. There no clicks or significant murmurs. The abdomen is soft. There is trace peripheral edema today. There are no musculoskeletal deformities. There are no skin rashes.  Filed Vitals:   11/02/13 1303  BP:  117/80  Pulse: 85  Height: 6' 2.5" (1.892 m)  Weight: 296 lb (134.265 kg)   EKG is done today and reviewed by me. There is normal sinus rhythm. No significant change from the past.  ASSESSMENT & PLAN

## 2013-11-02 NOTE — Assessment & Plan Note (Signed)
Coronary disease is stable. Nuclear scan in 2013 revealed no ischemia.

## 2013-11-02 NOTE — Assessment & Plan Note (Signed)
Patient had brief atrial fibrillation after hip surgery in 2012. This resolved.

## 2013-11-02 NOTE — Assessment & Plan Note (Signed)
Blood pressure is controlled. No change in therapy. 

## 2013-11-02 NOTE — Patient Instructions (Signed)
Your physician recommends that you schedule a follow-up appointment in: 1 year. You will receive a reminder letter in the mail in about 10 months reminding you to call and schedule your appointment. If you don't receive this letter, please contact our office. Your physician recommends that you continue on your current medications as directed. Please refer to the Current Medication list given to you today. Your physician recommends that you have lab work to check your BMET,TSH and CBC.

## 2013-11-13 ENCOUNTER — Telehealth: Payer: Self-pay | Admitting: *Deleted

## 2013-11-13 DIAGNOSIS — N189 Chronic kidney disease, unspecified: Secondary | ICD-10-CM

## 2013-11-13 DIAGNOSIS — I4891 Unspecified atrial fibrillation: Secondary | ICD-10-CM

## 2013-11-13 DIAGNOSIS — I10 Essential (primary) hypertension: Secondary | ICD-10-CM

## 2013-11-13 MED ORDER — POTASSIUM CHLORIDE CRYS ER 20 MEQ PO TBCR: 40.0000 meq | EXTENDED_RELEASE_TABLET | Freq: Every day | ORAL | Status: DC

## 2013-11-13 NOTE — Telephone Encounter (Signed)
Message copied by Merlene Laughter on Tue Nov 13, 2013  4:48 PM ------      Message from: Ron Parker, Elliston D      Created: Fri Nov 09, 2013  5:39 PM       Please let the patient know that her kidney function is good and her thyroid test is good. Her potassium is borderline low. Please check with her to be sure she is taking her potassium. If she is really taking 20 mEq daily, it needs to be increased to 40 mEq daily. If this changes done she needs followup chemistry lab with potassium 3 weeks after this ------

## 2013-11-13 NOTE — Telephone Encounter (Signed)
Wife informed and request lab order be mailed to home address along with copy of lab results. Wife also requested that copy be sent to PCP.

## 2014-05-09 ENCOUNTER — Ambulatory Visit (INDEPENDENT_AMBULATORY_CARE_PROVIDER_SITE_OTHER): Payer: Medicare Other | Admitting: Internal Medicine

## 2014-07-31 ENCOUNTER — Institutional Professional Consult (permissible substitution): Payer: Medicare Other | Admitting: Internal Medicine

## 2014-08-12 ENCOUNTER — Institutional Professional Consult (permissible substitution): Payer: Medicare Other | Admitting: Internal Medicine

## 2014-09-23 ENCOUNTER — Encounter: Payer: Self-pay | Admitting: Cardiology

## 2014-11-15 ENCOUNTER — Encounter: Payer: Self-pay | Admitting: *Deleted

## 2014-11-20 ENCOUNTER — Ambulatory Visit: Payer: Medicare Other | Admitting: Cardiology

## 2014-12-09 ENCOUNTER — Ambulatory Visit (INDEPENDENT_AMBULATORY_CARE_PROVIDER_SITE_OTHER): Payer: Medicare Other | Admitting: Internal Medicine

## 2014-12-09 ENCOUNTER — Encounter (INDEPENDENT_AMBULATORY_CARE_PROVIDER_SITE_OTHER): Payer: Self-pay | Admitting: Internal Medicine

## 2014-12-09 VITALS — BP 136/80 | HR 84 | Temp 98.4°F | Ht 74.0 in | Wt 298.0 lb

## 2014-12-09 DIAGNOSIS — R197 Diarrhea, unspecified: Secondary | ICD-10-CM | POA: Diagnosis not present

## 2014-12-09 MED ORDER — METRONIDAZOLE 250 MG PO TABS: 250.0000 mg | ORAL_TABLET | Freq: Three times a day (TID) | ORAL | Status: DC

## 2014-12-09 NOTE — Patient Instructions (Signed)
GI Pathogen. Imodium BID Flagyl 250mg  TID x 10 days.

## 2014-12-09 NOTE — Progress Notes (Addendum)
Subjective:    Patient ID: Bryce Berry, male    DOB: [birth year], 74 y.o.   MRN: [id number]  HPI Weight is stated. Patient is unable to stand. He has frequent diarrhea. He has diarrhea daily. Has been using Lomotil and Pepto Bismol which really has not helped. Symptoms for about a week. He is having 2 stools a and sometimes more. Stools are very loose. Stools altern ate between diarrhea and hard stools. He has been incontinent. Same symptoms for along time.  Hx of diverticulitis with hx of colon resection. He was on antibiotics about a week ago for a UTI.  Wife could not tell me what the antibiotic was.  There has been no fever. Has been in a w/c for over a year.  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 Laural Golden:   Impression:  Examination performed to cecum. Pancolonic diverticulosis. 4 mm polyp cold snare from transverse colon. Internal hemorrhoids. Suspect he bleeds intermittently from hemorrhoids and or diverticulosis   s with hx of colon resection. He says he is more comfortable lying on his rt side. The pain occurs about every other day. 09/22/2012 ALP 66, AST 56, ALT 48. His last colonoscopy was in 2011 for pancolonic Divcerticulosis. Small diverticula scqattered throughout his colon. Wide open colon anastomosis at 28cm from the anal margin.  09/22/2009 Colonoscopy: hx of colonic polyps Pancolonic diverticulosis. Small diverticula scattered thru out his colon. Wide open colonic anastomosis at 28cm from the anal margin  416/2012 Gastric emptying study: Markedly delayed gastric emptying with only 5% emptying of tracer from the stomach at 2 hours.    Review of Systems   Past Medical History  Diagnosis Date  . Atrial fibrillation     Postop hip surgery, January 2012, digoxin and diltiazem, reverted to sinus in the hospital  . Osteoarthritis   . COPD (chronic obstructive pulmonary disease)    Emphysema  . Depression   . Posttraumatic stress disorder   . GERD (gastroesophageal reflux disease)   . Irritable bowel   . BPH (benign prostatic hyperplasia)   . Urinary tract bacterial infections   . Osteoporosis   . Hypertension   . Dyslipidemia   . CAD (coronary artery disease)     Stents placed in [place] and [place] in the past  /Myoview 2008, no ischemia, /   . Atrial flutter     History of the past with syncope and  ?? H. and consideration, no records  . 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 Hip pain   . Shoulder pain   . Gout   . Peripheral arterial disease     Dopplers Dr.Vyas office October, 2010, apparent occlusion anterior tibial arteries bilaterally., findings suggest hemodynamically significant stenosis of the proximal right provisional femoral artery and the mid left superficial artery.  Ankle-brachial indices indicate moderate stenosis within both lower extremities  . Chronic kidney disease     Creatinine 2.1, October 13, 2010--> 0.74 10/2012  . Hyperlipidemia     Mixed  . GI bleed   . Ejection fraction     EF 50-55%, echo, May, 2013, severe inferior hypokinesis  . Bilateral pulmonary embolism     a. 10/2012 - V:Q scan high prob for Bilat PE in setting of dyspnea/hemoptysis-->anticoagulation started.  . Right leg DVT     a. 10/2012 - subocclusive DVT R popliteal vein.    Past Surgical History  Procedure  Laterality Date  . Knee arthroplasty      Right  . Total knee arthroplasty      Left  . Total hip arthroplasty      Right-complicated hx  . Colon resection    . Back surgery      [place]  . Prostate surgery    . Colonoscopy N/A 03/13/2013    Procedure: COLONOSCOPY;  Surgeon: Rogene Houston, MD;  Location: [facility];  Service: Endoscopy;  Laterality: N/A;  730    Allergies  Allergen Reactions  . Iodine     REACTION: rash  . Penicillins     Rapid heart rate; flushed  . Sulfonamide Derivatives     Rapid heart rate; flushed     Current Outpatient Prescriptions on File Prior to Visit  Medication Sig Dispense Refill  . allopurinol (ZYLOPRIM) 100 MG tablet Take 100 mg by mouth 2 (two) times daily.     Marland Kitchen alum & mag hydroxide-simeth (MYLANTA) 893-810-17 MG/5ML suspension Take 15 mLs by mouth as needed for indigestion.     Marland Kitchen ascorbic acid (VITAMIN C) 500 MG tablet Take 500 mg by mouth daily.     Marland Kitchen aspirin EC 81 MG tablet Take 81 mg by mouth daily.    . calcium-vitamin D (OSCAL WITH D) 500-200 MG-UNIT per tablet Take 1 tablet by mouth daily.     . cilostazol (PLETAL) 100 MG tablet Take 100 mg by mouth daily.      . colchicine 0.6 MG tablet Take 0.6 mg by mouth daily as needed (gout flair up).     . diltiazem (CARDIZEM CD) 240 MG 24 hr capsule Take 1 capsule (240 mg total) by mouth daily. 90 capsule 3  . fenofibrate (TRICOR) 145 MG tablet Take 1 tablet (145 mg total) by mouth daily. 30 tablet 6  . Ferrous Sulfate (RA IRON) 27 MG TABS Take 1 tablet by mouth daily.      . finasteride (PROSCAR) 5 MG tablet Take 5 mg by mouth daily.      . furosemide (LASIX) 20 MG tablet Take 2 tablets (40 mg total) by mouth daily. 270 tablet 3  . HYDROcodone-acetaminophen (NORCO) 10-325 MG per tablet Take 1 tablet by mouth every 4 (four) hours as needed for pain.     . meloxicam (MOBIC) 7.5 MG tablet Take 7.5 mg by mouth daily.    . metoCLOPramide (REGLAN) 10 MG tablet Take 1 tablet (10 mg total) by mouth daily. 30 tablet 5  . metoprolol (LOPRESSOR) 100 MG tablet Take 1 tablet (100 mg total) by mouth 2 (two) times daily. 180 tablet 3  . Multiple Vitamin (MULTIVITAMIN) tablet Take 1 tablet by mouth daily.      . nitroGLYCERIN (NITROSTAT) 0.4 MG SL tablet Place 1 tablet (0.4 mg total) under the tongue every 5 (five) minutes as needed. 25 tablet 3  . Omega-3 Fatty Acids (FISH OIL) 1000 MG CAPS Take 2 capsules by mouth daily.     . pantoprazole (PROTONIX) 40 MG tablet Take 40 mg by mouth daily.      . potassium chloride SA (K-DUR,KLOR-CON) 20  MEQ tablet Take 2 tablets (40 mEq total) by mouth daily. 180 tablet 3  . psyllium (METAMUCIL SMOOTH TEXTURE) 28 % packet Take 1 packet by mouth at bedtime.    . Rivaroxaban (XARELTO) 20 MG TABS Take 1 tablet (20 mg total) by mouth daily. 30 tablet 3  . tamsulosin (FLOMAX) 0.4 MG CAPS capsule Take 0.4 mg by mouth  daily after supper.    . rosuvastatin (CRESTOR) 40 MG tablet Take 1 tablet (40 mg total) by mouth every evening. (Patient not taking: Reported on 12/09/2014) 90 tablet 3  . [DISCONTINUED] solifenacin (VESICARE) 10 MG tablet Take 10 mg by mouth daily.      No current facility-administered medications on file prior to visit.        Objective:   Physical Exam Blood pressure 136/80, pulse 84, temperature 98.4 F (36.9 C), height 6\' 2"  (1.88 m), weight 298 lb (135.172 kg).  Alert and oriented. Skin warm and dry. Oral mucosa is moist.   . Sclera anicteric, conjunctivae is pink. Thyroid not enlarged. No cervical lymphadenopathy. Lungs clear. Heart regular rate and rhythm.  Abdomen is soft. Bowel sounds are positive. No hepatomegaly. No abdominal masses felt. No tenderness. Appears to have deformity to his rt foot/ankle area. .   Patient examined from wheel chair.       Assessment & Plan:  Diarrhea. An infectious process needs to be ruled out.  GI pathogen. Flagyl 250mg  TID x 10 days sent to his pharmacy Further recommendations to follow.  Imodium BID Flagyl 250mg  TID x 10 days after stool samples.

## 2014-12-11 LAB — GASTROINTESTINAL PATHOGEN PANEL PCR
C. difficile Tox A/B, PCR: NEGATIVE
CRYPTOSPORIDIUM, PCR: NEGATIVE
Campylobacter, PCR: NEGATIVE
E COLI 0157, PCR: NEGATIVE
E coli (ETEC) LT/ST PCR: NEGATIVE
E coli (STEC) stx1/stx2, PCR: NEGATIVE
GIARDIA LAMBLIA, PCR: NEGATIVE
Norovirus, PCR: NEGATIVE
ROTAVIRUS, PCR: NEGATIVE
SALMONELLA, PCR: NEGATIVE
SHIGELLA, PCR: NEGATIVE

## 2014-12-16 ENCOUNTER — Encounter: Payer: Self-pay | Admitting: Cardiology

## 2014-12-16 ENCOUNTER — Ambulatory Visit (INDEPENDENT_AMBULATORY_CARE_PROVIDER_SITE_OTHER): Payer: Medicare Other | Admitting: Cardiology

## 2014-12-16 VITALS — BP 102/69 | HR 88 | Ht 74.0 in | Wt 298.0 lb

## 2014-12-16 DIAGNOSIS — I2510 Atherosclerotic heart disease of native coronary artery without angina pectoris: Secondary | ICD-10-CM

## 2014-12-16 DIAGNOSIS — Z7901 Long term (current) use of anticoagulants: Secondary | ICD-10-CM | POA: Diagnosis not present

## 2014-12-16 DIAGNOSIS — I4891 Unspecified atrial fibrillation: Secondary | ICD-10-CM | POA: Diagnosis not present

## 2014-12-16 DIAGNOSIS — R197 Diarrhea, unspecified: Secondary | ICD-10-CM

## 2014-12-16 DIAGNOSIS — I959 Hypotension, unspecified: Secondary | ICD-10-CM | POA: Insufficient documentation

## 2014-12-16 DIAGNOSIS — Z9229 Personal history of other drug therapy: Secondary | ICD-10-CM

## 2014-12-16 DIAGNOSIS — I9589 Other hypotension: Secondary | ICD-10-CM

## 2014-12-16 MED ORDER — DILTIAZEM HCL ER COATED BEADS 120 MG PO CP24: 120.0000 mg | ORAL_CAPSULE | Freq: Every day | ORAL | Status: DC

## 2014-12-16 NOTE — Patient Instructions (Signed)
Your physician has recommended you make the following change in your medication:  Please take cardizem cd 120 mg- 240 mg daily as directed based off blood pressure readings. Hold furosemide for 2 days. Continue all other medications the same. Your physician recommends that you schedule a follow-up appointment in: 6 months. You will receive a reminder letter in the mail in about 4 months reminding you to call and schedule your appointment. If you don't receive this letter, please contact our office.

## 2014-12-16 NOTE — Assessment & Plan Note (Signed)
Recently his had significant diarrhea. He is on Flagyl now. In the past 2 days he's had decreased bowel movements and his wife stopped his Imodium.

## 2014-12-16 NOTE — Assessment & Plan Note (Signed)
The patient had atrial fibrillation around the time of hip surgery in 2012. He had reverted to sinus rhythm. At some point he has reverted back to atrial fibrillation. He did not receive his diltiazem this morning. His rate at this time is 95 without this medication. We will need to continue his diltiazem. He is anticoagulated.

## 2014-12-16 NOTE — Progress Notes (Signed)
Cardiology Office Note   Date:  12/16/2014   ID:  Bryce Berry, DOB 23-Apr-1941, MRN [id number]  PCP:  Glenda Chroman., MD  Cardiologist:  Dola Argyle, MD   Chief Complaint  Patient presents with  . Appointment    Follow up CAD and atrial fibrillation      History of Present Illness: Bryce Berry is a 74 y.o. male who presents today to follow-up atrial fibrillation and coronary disease. I saw him last March, 2015. He has chronic edema. In addition he had pulmonary emboli in 2014. He is on is on xarelto for this and his atrial fibrillation. Recently he's had significant diarrhea. He is receiving treatment for it. His bowel movement frequency has decreased, but his dietary intake has been poor for several days. His blood pressure was lower than usual today and his wife held his diltiazem. Pressure in the office now is 710 systolic. His underlying atrial fibrillation is at 100. He is not having any significant chest pain.    Past Medical History  Diagnosis Date  . Atrial fibrillation     Postop hip surgery, January 2012, digoxin and diltiazem, reverted to sinus in the hospital  . Osteoarthritis   . COPD (chronic obstructive pulmonary disease)     Emphysema  . Depression   . Posttraumatic stress disorder   . GERD (gastroesophageal reflux disease)   . Irritable bowel   . BPH (benign prostatic hyperplasia)   . Urinary tract bacterial infections   . Osteoporosis   . Hypertension   . Dyslipidemia   . CAD (coronary artery disease)     Stents placed in [place] and [place] in the past  /Myoview 2008, no ischemia, /   . Atrial flutter     History of the past with syncope and  ?? H. and consideration, no records  . 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 Hip pain   . Shoulder pain   . Gout   . Peripheral arterial disease     Dopplers Dr.Vyas office October, 2010, apparent occlusion anterior tibial arteries bilaterally., findings suggest hemodynamically significant  stenosis of the proximal right provisional femoral artery and the mid left superficial artery.  Ankle-brachial indices indicate moderate stenosis within both lower extremities  . Chronic kidney disease     Creatinine 2.1, October 13, 2010--> 0.74 10/2012  . Hyperlipidemia     Mixed  . GI bleed   . Ejection fraction     EF 50-55%, echo, May, 2013, severe inferior hypokinesis  . Bilateral pulmonary embolism     a. 10/2012 - V:Q scan high prob for Bilat PE in setting of dyspnea/hemoptysis-->anticoagulation started.  . Right leg DVT     a. 10/2012 - subocclusive DVT R popliteal vein.    Past Surgical History  Procedure Laterality Date  . Knee arthroplasty      Right  . Total knee arthroplasty      Left  . Total hip arthroplasty      Right-complicated hx  . Colon resection    . Back surgery      [place]  . Prostate surgery    . Colonoscopy N/A 03/13/2013    Procedure: COLONOSCOPY;  Surgeon: Rogene Houston, MD;  Location: [facility];  Service: Endoscopy;  Laterality: N/A;  730    Patient Active Problem List   Diagnosis Date Noted  . HX: anticoagulation 11/02/2013  . Right leg DVT   . Bilateral pulmonary embolism   .  Ejection fraction   . Anemia 03/01/2012  . History of lower GI bleeding 11/01/2011  . Gastroparesis 07/13/2011  . Constipation 07/13/2011  . Atrial fibrillation   . Renal insufficiency   . COPD (chronic obstructive pulmonary disease)   . Posttraumatic stress disorder   . GERD (gastroesophageal reflux disease)   . Hypertension   . Dyslipidemia   . CAD (coronary artery disease)   . Atrial flutter   . Gout   . Peripheral arterial disease   . Chronic kidney disease   . Atrial flutter 06/20/2009  . PERIPHERAL VASCULAR DISEASE 06/20/2009  . HYPERLIPIDEMIA-MIXED 05/09/2009  . HYPERTENSION, UNSPECIFIED 05/09/2009  . OTH COMPLICATIONS DUE INTERNAL JOINT PROSTHESIS 04/01/2009  . 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 SHOULDER PAIN 09/25/2007  .  HIP PAIN 09/25/2007  . BACK PAIN 09/25/2007  . IMPINGEMENT SYNDROME 09/25/2007      Current Outpatient Prescriptions  Medication Sig Dispense Refill  . allopurinol (ZYLOPRIM) 100 MG tablet Take 100 mg by mouth 2 (two) times daily.     Marland Kitchen alum & mag hydroxide-simeth (MYLANTA) 130-865-78 MG/5ML suspension Take 15 mLs by mouth as needed for indigestion.     Marland Kitchen ascorbic acid (VITAMIN C) 500 MG tablet Take 500 mg by mouth daily.     Marland Kitchen aspirin EC 81 MG tablet Take 81 mg by mouth daily.    . calcium-vitamin D (OSCAL WITH D) 500-200 MG-UNIT per tablet Take 1 tablet by mouth daily.     . cilostazol (PLETAL) 100 MG tablet Take 100 mg by mouth daily.      . colchicine 0.6 MG tablet Take 0.6 mg by mouth daily as needed (gout flair up).     . fenofibrate (TRICOR) 145 MG tablet Take 1 tablet (145 mg total) by mouth daily. 30 tablet 6  . Ferrous Sulfate (RA IRON) 27 MG TABS Take 1 tablet by mouth daily.      . finasteride (PROSCAR) 5 MG tablet Take 5 mg by mouth daily.      . furosemide (LASIX) 20 MG tablet Take 2 tablets (40 mg total) by mouth daily. 270 tablet 3  . HYDROcodone-acetaminophen (NORCO) 10-325 MG per tablet Take 1 tablet by mouth every 4 (four) hours as needed for pain.     . meloxicam (MOBIC) 7.5 MG tablet Take 7.5 mg by mouth daily.    . metoCLOPramide (REGLAN) 10 MG tablet Take 1 tablet (10 mg total) by mouth daily. 30 tablet 5  . metoprolol (LOPRESSOR) 100 MG tablet Take 1 tablet (100 mg total) by mouth 2 (two) times daily. 180 tablet 3  . metroNIDAZOLE (FLAGYL) 250 MG tablet Take 1 tablet (250 mg total) by mouth 3 (three) times daily. 30 tablet 0  . Multiple Vitamin (MULTIVITAMIN) tablet Take 1 tablet by mouth daily.      . nitroGLYCERIN (NITROSTAT) 0.4 MG SL tablet Place 1 tablet (0.4 mg total) under the tongue every 5 (five) minutes as needed. 25 tablet 3  . Omega-3 Fatty Acids (FISH OIL) 1000 MG CAPS Take 2 capsules by mouth daily.     . pantoprazole (PROTONIX) 40 MG tablet Take 40 mg  by mouth daily.      . potassium chloride SA (K-DUR,KLOR-CON) 20 MEQ tablet Take 2 tablets (40 mEq total) by mouth daily. 180 tablet 3  . psyllium (METAMUCIL SMOOTH TEXTURE) 28 % packet Take 1 packet by mouth at bedtime.    . Rivaroxaban (XARELTO) 20 MG TABS Take 1 tablet (20 mg total) by mouth  daily. 30 tablet 3  . rosuvastatin (CRESTOR) 40 MG tablet Take 1 tablet (40 mg total) by mouth every evening. 90 tablet 3  . tamsulosin (FLOMAX) 0.4 MG CAPS capsule Take 0.4 mg by mouth daily after supper.    . [DISCONTINUED] solifenacin (VESICARE) 10 MG tablet Take 10 mg by mouth daily.      No current facility-administered medications for this visit.    Allergies:   Iodine; Penicillins; and Sulfonamide derivatives    Social History:  The patient  reports that he quit smoking about 8 years ago. His smoking use included Cigarettes. He has a 30 pack-year smoking history. He has never used smokeless tobacco. He reports that he does not drink alcohol or use illicit drugs.   Family History:  The patient's family history includes Diabetes in his daughter; Hypertension in his daughter; Obesity in his daughter.    ROS:  Please see the history of present illness.     The review of systems is taken with the patient and his wife. His short-term memory appears to be decreasing. The patient denies fever, chills, headache, sweats, rash, change in vision, change in hearing, chest pain, cough, nausea or vomiting, urinary symptoms. All other systems are reviewed and are negative.   PHYSICAL EXAM: VS:  BP 102/69 mmHg  Pulse 88  Ht 6\' 2"  (1.88 m)  Wt 298 lb (135.172 kg)  BMI 38.24 kg/m2  SpO2 96% , Patient is overweight. He is in a wheelchair. He is stable. He is oriented to person time and place. Affect is normal. Head is atraumatic. Sclera and conjunctiva are normal. There is no jugular venous distention. Lungs are clear. Respiratory effort is nonlabored. Cardiac exam reveals S1 and S2. The rhythm is irregularly  irregular and the rate is mildly increased. The abdomen is soft. The patient has bilateral deformities of his feet. There is mild edema.  EKG:   EKG is done today and reviewed by me. There is atrial fibrillation with a rate of 99. There is no change in the QRS.   Recent Labs: No results found for requested labs within last 365 days.    Lipid Panel No results found for: CHOL, TRIG, HDL, CHOLHDL, VLDL, LDLCALC, LDLDIRECT    Wt Readings from Last 3 Encounters:  12/16/14 298 lb (135.172 kg)  12/09/14 298 lb (135.172 kg)  11/02/13 296 lb (134.265 kg)      Current medicines are reviewed  The patient's wife understands his medications well.     ASSESSMENT AND PLAN:

## 2014-12-16 NOTE — Assessment & Plan Note (Signed)
The patient will remain anticoagulated with his atrial fibrillation.

## 2014-12-16 NOTE — Assessment & Plan Note (Signed)
Coronary disease is stable. No further workup at this time. 

## 2014-12-16 NOTE — Assessment & Plan Note (Signed)
Blood pressure has been lower than usual yesterday and today. Despite the fact that he has some edema, I believe that he may be total body volume depleted. He has had diarrhea and he is eating poorly. I've suggested that his small dose of diuretics be held for one or 2 days. I'm hoping that his pressure will drift back up. At that time it will be easier for his wife to continue to dose the medications that he needs for atrial fib rate control.

## 2014-12-21 ENCOUNTER — Encounter (HOSPITAL_COMMUNITY): Payer: Self-pay | Admitting: Emergency Medicine

## 2014-12-21 ENCOUNTER — Emergency Department (HOSPITAL_COMMUNITY): Payer: Medicare Other

## 2014-12-21 ENCOUNTER — Inpatient Hospital Stay (HOSPITAL_COMMUNITY)
Admission: EM | Admit: 2014-12-21 | Discharge: 2014-12-25 | DRG: 394 | Disposition: A | Discharge status: Home / Self Care | Payer: Medicare Other | Attending: Internal Medicine | Admitting: Internal Medicine

## 2014-12-21 DIAGNOSIS — K921 Melena: Secondary | ICD-10-CM | POA: Diagnosis present

## 2014-12-21 DIAGNOSIS — Z9229 Personal history of other drug therapy: Secondary | ICD-10-CM

## 2014-12-21 DIAGNOSIS — Z7901 Long term (current) use of anticoagulants: Secondary | ICD-10-CM

## 2014-12-21 DIAGNOSIS — Z7401 Bed confinement status: Secondary | ICD-10-CM | POA: Diagnosis not present

## 2014-12-21 DIAGNOSIS — I4891 Unspecified atrial fibrillation: Secondary | ICD-10-CM | POA: Diagnosis present

## 2014-12-21 DIAGNOSIS — N189 Chronic kidney disease, unspecified: Secondary | ICD-10-CM | POA: Diagnosis present

## 2014-12-21 DIAGNOSIS — Z96653 Presence of artificial knee joint, bilateral: Secondary | ICD-10-CM | POA: Diagnosis present

## 2014-12-21 DIAGNOSIS — I10 Essential (primary) hypertension: Secondary | ICD-10-CM | POA: Diagnosis not present

## 2014-12-21 DIAGNOSIS — Z955 Presence of coronary angioplasty implant and graft: Secondary | ICD-10-CM | POA: Diagnosis not present

## 2014-12-21 DIAGNOSIS — K633 Ulcer of intestine: Principal | ICD-10-CM | POA: Diagnosis present

## 2014-12-21 DIAGNOSIS — Z833 Family history of diabetes mellitus: Secondary | ICD-10-CM | POA: Diagnosis not present

## 2014-12-21 DIAGNOSIS — I739 Peripheral vascular disease, unspecified: Secondary | ICD-10-CM | POA: Diagnosis present

## 2014-12-21 DIAGNOSIS — F4310 Post-traumatic stress disorder, unspecified: Secondary | ICD-10-CM | POA: Diagnosis present

## 2014-12-21 DIAGNOSIS — Z86711 Personal history of pulmonary embolism: Secondary | ICD-10-CM | POA: Diagnosis not present

## 2014-12-21 DIAGNOSIS — D62 Acute posthemorrhagic anemia: Secondary | ICD-10-CM | POA: Diagnosis present

## 2014-12-21 DIAGNOSIS — K219 Gastro-esophageal reflux disease without esophagitis: Secondary | ICD-10-CM | POA: Diagnosis present

## 2014-12-21 DIAGNOSIS — E669 Obesity, unspecified: Secondary | ICD-10-CM | POA: Diagnosis present

## 2014-12-21 DIAGNOSIS — K259 Gastric ulcer, unspecified as acute or chronic, without hemorrhage or perforation: Secondary | ICD-10-CM | POA: Diagnosis present

## 2014-12-21 DIAGNOSIS — Z96643 Presence of artificial hip joint, bilateral: Secondary | ICD-10-CM | POA: Diagnosis present

## 2014-12-21 DIAGNOSIS — E785 Hyperlipidemia, unspecified: Secondary | ICD-10-CM | POA: Diagnosis present

## 2014-12-21 DIAGNOSIS — D649 Anemia, unspecified: Secondary | ICD-10-CM

## 2014-12-21 DIAGNOSIS — A047 Enterocolitis due to Clostridium difficile: Secondary | ICD-10-CM | POA: Diagnosis present

## 2014-12-21 DIAGNOSIS — Z993 Dependence on wheelchair: Secondary | ICD-10-CM

## 2014-12-21 DIAGNOSIS — R109 Unspecified abdominal pain: Secondary | ICD-10-CM

## 2014-12-21 DIAGNOSIS — Z87891 Personal history of nicotine dependence: Secondary | ICD-10-CM | POA: Diagnosis not present

## 2014-12-21 DIAGNOSIS — Z791 Long term (current) use of non-steroidal anti-inflammatories (NSAID): Secondary | ICD-10-CM | POA: Diagnosis not present

## 2014-12-21 DIAGNOSIS — K5733 Diverticulitis of large intestine without perforation or abscess with bleeding: Secondary | ICD-10-CM | POA: Diagnosis not present

## 2014-12-21 DIAGNOSIS — M109 Gout, unspecified: Secondary | ICD-10-CM | POA: Diagnosis present

## 2014-12-21 DIAGNOSIS — M810 Age-related osteoporosis without current pathological fracture: Secondary | ICD-10-CM | POA: Diagnosis present

## 2014-12-21 DIAGNOSIS — I2510 Atherosclerotic heart disease of native coronary artery without angina pectoris: Secondary | ICD-10-CM | POA: Diagnosis present

## 2014-12-21 DIAGNOSIS — Z86718 Personal history of other venous thrombosis and embolism: Secondary | ICD-10-CM

## 2014-12-21 DIAGNOSIS — K5730 Diverticulosis of large intestine without perforation or abscess without bleeding: Secondary | ICD-10-CM | POA: Diagnosis not present

## 2014-12-21 DIAGNOSIS — K449 Diaphragmatic hernia without obstruction or gangrene: Secondary | ICD-10-CM | POA: Diagnosis present

## 2014-12-21 DIAGNOSIS — K5732 Diverticulitis of large intestine without perforation or abscess without bleeding: Secondary | ICD-10-CM | POA: Diagnosis present

## 2014-12-21 DIAGNOSIS — I4892 Unspecified atrial flutter: Secondary | ICD-10-CM | POA: Diagnosis present

## 2014-12-21 DIAGNOSIS — K5792 Diverticulitis of intestine, part unspecified, without perforation or abscess without bleeding: Secondary | ICD-10-CM | POA: Diagnosis present

## 2014-12-21 DIAGNOSIS — Z7982 Long term (current) use of aspirin: Secondary | ICD-10-CM | POA: Diagnosis not present

## 2014-12-21 DIAGNOSIS — K3184 Gastroparesis: Secondary | ICD-10-CM | POA: Diagnosis present

## 2014-12-21 DIAGNOSIS — I129 Hypertensive chronic kidney disease with stage 1 through stage 4 chronic kidney disease, or unspecified chronic kidney disease: Secondary | ICD-10-CM | POA: Diagnosis present

## 2014-12-21 DIAGNOSIS — M1990 Unspecified osteoarthritis, unspecified site: Secondary | ICD-10-CM | POA: Diagnosis present

## 2014-12-21 DIAGNOSIS — Z8249 Family history of ischemic heart disease and other diseases of the circulatory system: Secondary | ICD-10-CM

## 2014-12-21 DIAGNOSIS — D638 Anemia in other chronic diseases classified elsewhere: Secondary | ICD-10-CM | POA: Diagnosis present

## 2014-12-21 DIAGNOSIS — A0472 Enterocolitis due to Clostridium difficile, not specified as recurrent: Secondary | ICD-10-CM | POA: Diagnosis present

## 2014-12-21 DIAGNOSIS — K922 Gastrointestinal hemorrhage, unspecified: Secondary | ICD-10-CM | POA: Diagnosis not present

## 2014-12-21 DIAGNOSIS — Z87440 Personal history of urinary (tract) infections: Secondary | ICD-10-CM | POA: Diagnosis not present

## 2014-12-21 DIAGNOSIS — J449 Chronic obstructive pulmonary disease, unspecified: Secondary | ICD-10-CM | POA: Diagnosis present

## 2014-12-21 DIAGNOSIS — R100 Acute abdomen: Secondary | ICD-10-CM | POA: Diagnosis not present

## 2014-12-21 LAB — COMPREHENSIVE METABOLIC PANEL
ALBUMIN: 2.6 g/dL — AB (ref 3.5–5.0)
ALK PHOS: 43 U/L (ref 38–126)
ALT: 15 U/L — ABNORMAL LOW (ref 17–63)
AST: 20 U/L (ref 15–41)
Anion gap: 4 — ABNORMAL LOW (ref 5–15)
BILIRUBIN TOTAL: 0.4 mg/dL (ref 0.3–1.2)
BUN: 24 mg/dL — ABNORMAL HIGH (ref 6–20)
CHLORIDE: 107 mmol/L (ref 101–111)
CO2: 27 mmol/L (ref 22–32)
Calcium: 8.9 mg/dL (ref 8.9–10.3)
Creatinine, Ser: 1.11 mg/dL (ref 0.61–1.24)
GFR calc Af Amer: >60 mL/min (ref >60)
GFR calc non Af Amer: >60 mL/min (ref >60)
Glucose, Bld: 107 mg/dL — ABNORMAL HIGH (ref 70–99)
POTASSIUM: 4.3 mmol/L (ref 3.5–5.1)
SODIUM: 138 mmol/L (ref 135–145)
Total Protein: 5.9 g/dL — ABNORMAL LOW (ref 6.5–8.1)

## 2014-12-21 LAB — URINALYSIS, ROUTINE W REFLEX MICROSCOPIC
BILIRUBIN URINE: NEGATIVE
GLUCOSE, UA: NEGATIVE mg/dL
Ketones, ur: NEGATIVE mg/dL
Nitrite: NEGATIVE
PH: 6.5 (ref 5.0–8.0)
Protein, ur: NEGATIVE mg/dL
Specific Gravity, Urine: 1.015 (ref 1.005–1.030)
Urobilinogen, UA: 0.2 mg/dL (ref 0.0–1.0)

## 2014-12-21 LAB — CBC WITH DIFFERENTIAL/PLATELET
Basophils Absolute: 0 10*3/uL (ref 0.0–0.1)
Basophils Relative: 0 % (ref 0–1)
EOS ABS: 0 10*3/uL (ref 0.0–0.7)
EOS PCT: 0 % (ref 0–5)
HCT: 27.6 % — ABNORMAL LOW (ref 39.0–52.0)
Hemoglobin: 8.9 g/dL — ABNORMAL LOW (ref 13.0–17.0)
LYMPHS ABS: 1.1 10*3/uL (ref 0.7–4.0)
Lymphocytes Relative: 7 % — ABNORMAL LOW (ref 12–46)
MCH: 28.3 pg (ref 26.0–34.0)
MCHC: 32.2 g/dL (ref 30.0–36.0)
MCV: 87.9 fL (ref 78.0–100.0)
MONOS PCT: 6 % (ref 3–12)
Monocytes Absolute: 0.9 10*3/uL (ref 0.1–1.0)
NEUTROS PCT: 87 % — AB (ref 43–77)
Neutro Abs: 13.9 10*3/uL — ABNORMAL HIGH (ref 1.7–7.7)
Platelets: 258 10*3/uL (ref 150–400)
RBC: 3.14 MIL/uL — ABNORMAL LOW (ref 4.22–5.81)
RDW: 20.4 % — AB (ref 11.5–15.5)
WBC: 15.9 10*3/uL — AB (ref 4.0–10.5)

## 2014-12-21 LAB — PROTIME-INR
INR: 1.84 — ABNORMAL HIGH (ref 0.00–1.49)
Prothrombin Time: 21.4 seconds — ABNORMAL HIGH (ref 11.6–15.2)

## 2014-12-21 LAB — URINE MICROSCOPIC-ADD ON

## 2014-12-21 LAB — HEMOGLOBIN AND HEMATOCRIT, BLOOD
HEMATOCRIT: 24.4 % — AB (ref 39.0–52.0)
HEMOGLOBIN: 8 g/dL — AB (ref 13.0–17.0)

## 2014-12-21 LAB — LIPASE, BLOOD: LIPASE: 14 U/L — AB (ref 22–51)

## 2014-12-21 LAB — MRSA PCR SCREENING: MRSA by PCR: POSITIVE — AB

## 2014-12-21 LAB — TROPONIN I

## 2014-12-21 LAB — ABO/RH: ABO/RH(D): O NEG

## 2014-12-21 MED ORDER — MUPIROCIN 2 % EX OINT
1.0000 "application " | TOPICAL_OINTMENT | Freq: Two times a day (BID) | CUTANEOUS | Status: DC
  Administered 2014-12-21 – 2014-12-25 (×8): 1 via NASAL
  Filled 2014-12-21 (×4): qty 22

## 2014-12-21 MED ORDER — ALLOPURINOL 100 MG PO TABS
100.0000 mg | ORAL_TABLET | Freq: Two times a day (BID) | ORAL | Status: DC
  Administered 2014-12-21 – 2014-12-25 (×8): 100 mg via ORAL
  Filled 2014-12-21 (×8): qty 1

## 2014-12-21 MED ORDER — TAMSULOSIN HCL 0.4 MG PO CAPS
0.4000 mg | ORAL_CAPSULE | Freq: Every day | ORAL | Status: DC
  Administered 2014-12-21 – 2014-12-24 (×4): 0.4 mg via ORAL
  Filled 2014-12-21 (×5): qty 1

## 2014-12-21 MED ORDER — METOCLOPRAMIDE HCL 5 MG/ML IJ SOLN: 10.0000 mg | Freq: Four times a day (QID) | INTRAMUSCULAR | Status: DC | PRN

## 2014-12-21 MED ORDER — METRONIDAZOLE IN NACL 5-0.79 MG/ML-% IV SOLN
500.0000 mg | Freq: Once | INTRAVENOUS | Status: AC
  Administered 2014-12-21: 500 mg via INTRAVENOUS
  Filled 2014-12-21: qty 100

## 2014-12-21 MED ORDER — FINASTERIDE 5 MG PO TABS
5.0000 mg | ORAL_TABLET | Freq: Every day | ORAL | Status: DC
  Administered 2014-12-21 – 2014-12-25 (×4): 5 mg via ORAL
  Filled 2014-12-21 (×7): qty 1

## 2014-12-21 MED ORDER — DILTIAZEM HCL 30 MG PO TABS: 30.0000 mg | ORAL_TABLET | Freq: Four times a day (QID) | ORAL | Status: DC

## 2014-12-21 MED ORDER — FINASTERIDE 5 MG PO TABS
ORAL_TABLET | ORAL | Status: AC
  Filled 2014-12-21: qty 1

## 2014-12-21 MED ORDER — ADULT MULTIVITAMIN W/MINERALS CH
1.0000 | ORAL_TABLET | Freq: Every day | ORAL | Status: DC
  Administered 2014-12-21 – 2014-12-25 (×5): 1 via ORAL
  Filled 2014-12-21 (×5): qty 1

## 2014-12-21 MED ORDER — METRONIDAZOLE IN NACL 5-0.79 MG/ML-% IV SOLN
500.0000 mg | Freq: Three times a day (TID) | INTRAVENOUS | Status: DC
  Administered 2014-12-22 (×4): 500 mg via INTRAVENOUS
  Filled 2014-12-21 (×4): qty 100

## 2014-12-21 MED ORDER — ACETAMINOPHEN 325 MG PO TABS: 650.0000 mg | ORAL_TABLET | Freq: Four times a day (QID) | ORAL | Status: DC | PRN

## 2014-12-21 MED ORDER — FENTANYL 100 MCG/HR TD PT72
100.0000 ug | MEDICATED_PATCH | TRANSDERMAL | Status: DC
  Administered 2014-12-22 – 2014-12-25 (×2): 100 ug via TRANSDERMAL
  Filled 2014-12-21 (×2): qty 1

## 2014-12-21 MED ORDER — ACETAMINOPHEN 650 MG RE SUPP: 650.0000 mg | Freq: Four times a day (QID) | RECTAL | Status: DC | PRN

## 2014-12-21 MED ORDER — ROSUVASTATIN CALCIUM 20 MG PO TABS
40.0000 mg | ORAL_TABLET | Freq: Every evening | ORAL | Status: DC
  Administered 2014-12-21 – 2014-12-24 (×4): 40 mg via ORAL
  Filled 2014-12-21 (×4): qty 2

## 2014-12-21 MED ORDER — SODIUM CHLORIDE 0.9 % IV BOLUS (SEPSIS): 500.0000 mL | Freq: Once | INTRAVENOUS | Status: DC

## 2014-12-21 MED ORDER — PANTOPRAZOLE SODIUM 40 MG IV SOLR
8.0000 mg/h | INTRAVENOUS | Status: DC
  Filled 2014-12-21 (×5): qty 80

## 2014-12-21 MED ORDER — DILTIAZEM HCL ER COATED BEADS 120 MG PO CP24
120.0000 mg | ORAL_CAPSULE | Freq: Every day | ORAL | Status: DC
Start: 2014-12-21 — End: 2014-12-21
  Administered 2014-12-21: 120 mg via ORAL
  Filled 2014-12-21: qty 1

## 2014-12-21 MED ORDER — CIPROFLOXACIN IN D5W 400 MG/200ML IV SOLN
400.0000 mg | Freq: Two times a day (BID) | INTRAVENOUS | Status: DC
  Administered 2014-12-22 – 2014-12-24 (×6): 400 mg via INTRAVENOUS
  Filled 2014-12-21 (×6): qty 200

## 2014-12-21 MED ORDER — CIPROFLOXACIN IN D5W 400 MG/200ML IV SOLN
400.0000 mg | Freq: Once | INTRAVENOUS | Status: AC
  Administered 2014-12-21: 400 mg via INTRAVENOUS
  Filled 2014-12-21: qty 200

## 2014-12-21 MED ORDER — PANTOPRAZOLE SODIUM 40 MG IV SOLR
40.0000 mg | Freq: Two times a day (BID) | INTRAVENOUS | Status: DC
  Administered 2014-12-21 – 2014-12-25 (×9): 40 mg via INTRAVENOUS
  Filled 2014-12-21 (×10): qty 40

## 2014-12-21 MED ORDER — DIPHENOXYLATE-ATROPINE 2.5-0.025 MG PO TABS
1.0000 | ORAL_TABLET | Freq: Four times a day (QID) | ORAL | Status: DC
  Administered 2014-12-21 (×2): 1 via ORAL
  Filled 2014-12-21 (×2): qty 1

## 2014-12-21 MED ORDER — SODIUM CHLORIDE 0.9 % IV SOLN
Freq: Once | INTRAVENOUS | Status: AC
  Administered 2014-12-22: via INTRAVENOUS

## 2014-12-21 MED ORDER — PANTOPRAZOLE SODIUM 40 MG IV SOLR: 40.0000 mg | Freq: Two times a day (BID) | INTRAVENOUS | Status: DC

## 2014-12-21 MED ORDER — MORPHINE SULFATE 4 MG/ML IJ SOLN
4.0000 mg | Freq: Once | INTRAMUSCULAR | Status: AC
  Administered 2014-12-21: 4 mg via INTRAVENOUS
  Filled 2014-12-21: qty 1

## 2014-12-21 MED ORDER — ONDANSETRON HCL 4 MG/2ML IJ SOLN
4.0000 mg | Freq: Once | INTRAMUSCULAR | Status: AC
  Administered 2014-12-21: 4 mg via INTRAVENOUS
  Filled 2014-12-21: qty 2

## 2014-12-21 MED ORDER — SODIUM CHLORIDE 0.9 % IJ SOLN
3.0000 mL | Freq: Two times a day (BID) | INTRAMUSCULAR | Status: DC
  Administered 2014-12-21 – 2014-12-25 (×8): 3 mL via INTRAVENOUS

## 2014-12-21 MED ORDER — PEG 3350-KCL-NABCB-NACL-NASULF 236 G PO SOLR
4000.0000 mL | Freq: Once | ORAL | Status: AC
  Administered 2014-12-22: 4000 mL via ORAL
  Filled 2014-12-21: qty 4000

## 2014-12-21 MED ORDER — HYDROCODONE-ACETAMINOPHEN 5-325 MG PO TABS
1.0000 | ORAL_TABLET | Freq: Four times a day (QID) | ORAL | Status: DC | PRN
  Administered 2014-12-22: 2 via ORAL
  Administered 2014-12-22: 1 via ORAL
  Administered 2014-12-23 – 2014-12-25 (×6): 2 via ORAL
  Filled 2014-12-21 (×4): qty 2
  Filled 2014-12-21: qty 1
  Filled 2014-12-21 (×4): qty 2

## 2014-12-21 MED ORDER — SODIUM CHLORIDE 0.9 % IV SOLN
80.0000 mg | Freq: Once | INTRAVENOUS | Status: DC
  Filled 2014-12-21: qty 80

## 2014-12-21 MED ORDER — CHLORHEXIDINE GLUCONATE CLOTH 2 % EX PADS
6.0000 | MEDICATED_PAD | Freq: Every day | CUTANEOUS | Status: DC
  Administered 2014-12-22 – 2014-12-25 (×4): 6 via TOPICAL

## 2014-12-21 MED ORDER — METOPROLOL TARTRATE 50 MG PO TABS
50.0000 mg | ORAL_TABLET | Freq: Two times a day (BID) | ORAL | Status: DC
  Administered 2014-12-21: 50 mg via ORAL
  Filled 2014-12-21: qty 1

## 2014-12-21 MED ORDER — NITROGLYCERIN 0.4 MG SL SUBL: 0.4000 mg | SUBLINGUAL_TABLET | SUBLINGUAL | Status: DC | PRN

## 2014-12-21 NOTE — Progress Notes (Signed)
ANTIBIOTIC CONSULT NOTE  Pharmacy Consult for Cipro Indication: diverticulitis  Allergies  Allergen Reactions  . Iodine     REACTION: rash  . Penicillins     Rapid heart rate; flushed  . Sulfonamide Derivatives     Rapid heart rate; flushed    Patient Measurements: Height: 6\' 2"  (188 cm) Weight: 300 lb (136.079 kg) IBW/kg (Calculated) : 82.2  Vital Signs: Temp: 98.5 F (36.9 C) (05/07 0809) Temp Source: Oral (05/07 0809) BP: 110/79 mmHg (05/07 1615) Pulse Rate: 103 (05/07 1615) Intake/Output from previous day:   Intake/Output from this shift:    Labs:  Recent Labs  12/21/14 0830 12/21/14 1554  WBC 15.9*  --   HGB 8.9* 8.0*  PLT 258  --   CREATININE 1.11  --    Estimated Creatinine Clearance: 88.3 mL/min (by C-G formula based on Cr of 1.11). No results for input(s): VANCOTROUGH, VANCOPEAK, VANCORANDOM, GENTTROUGH, GENTPEAK, GENTRANDOM, TOBRATROUGH, TOBRAPEAK, TOBRARND, AMIKACINPEAK, AMIKACINTROU, AMIKACIN in the last 72 hours.   Microbiology: No results found for this or any previous visit (from the past 720 hour(s)).  Anti-infectives    Start     Dose/Rate Route Frequency Ordered Stop   12/21/14 2200  metroNIDAZOLE (FLAGYL) IVPB 500 mg     500 mg 100 mL/hr over 60 Minutes Intravenous Every 8 hours 12/21/14 1517     12/21/14 2200  ciprofloxacin (CIPRO) IVPB 400 mg     400 mg 200 mL/hr over 60 Minutes Intravenous Every 12 hours 12/21/14 1607     12/21/14 1315  ciprofloxacin (CIPRO) IVPB 400 mg     400 mg 200 mL/hr over 60 Minutes Intravenous  Once 12/21/14 1312 12/21/14 1431   12/21/14 1315  metroNIDAZOLE (FLAGYL) IVPB 500 mg     500 mg 100 mL/hr over 60 Minutes Intravenous  Once 12/21/14 1312 12/21/14 1431      Assessment: 74 yo obese M with hx diverticulosis and Afib on chronic anticoagulation presented with diverticulitis with diverticular hemorrhage.  Empiric Cipro/Flagyl initiated for possible infection.   Renal function is slightly elevated above  baseline.  NCrCl ~ 7ml/min.   Goal of Therapy:  Eradicate infection.  Plan:  Cipro 400mg  IV q12h Flagyl per MD Change to PO once clinically appropriate Monitor renal function and cx data   Bryce Berry 12/21/2014,4:16 PM

## 2014-12-21 NOTE — Consult Note (Signed)
Referring Provider: No ref. provider found Primary Care Physician:  Glenda Chroman., MD Primary Gastroenterologist:  Dr. Laural Golden  Reason for Consultation:    Rectal bleeding and anemia.  HPI:   Patient is 74 year old African-American male who is well known to me from previous evaluation who was admitted earlier today while emergency room for rectal bleeding and anemia. Patient states he was doing well until about 3 weeks ago when he developed diarrhea which was followed by normal stools. He was seen in the office about 2 weeks ago and GI pathogen panel was negative. He was empirically treated with metronidazole and he thought he was getting better. However his diarrhea relapsed and he began to pass frank blood per rectum. Earlier he had noted blood on the tissue with this bowel movements when he would have loose stools. He has noted mild discomfort in left lower quadrant of his abdomen. He has not experienced fever chills nausea or vomiting. He states he has maintained good appetite during this illness. He has not taken any antibiotics within the last 2-3 months other than metronidazole which was prescribed through my office. Patient lives at home with his wife. He is essentially bed and via chair bound. He is able to transfer himself from bed to chair with transfer board. His wife helps him.  Pertinent GI history; He has has chronic GERD. He has large hiatal hernia. He has gastroparesis. He has been maintained on metoclopramide and luckily has not experienced any side effects. History of diverticulitis for which she had sigmoid colon resection about 20 years ago. He also has history of colonic adenomas. Lasts colonoscopy was in July 2014 revealing pancolonic diverticulosis.     Past Medical History  Diagnosis Date  . Atrial fibrillation     Postop hip surgery, January 2012, digoxin and diltiazem, reverted to sinus in the hospital  . Osteoarthritis   . COPD (chronic obstructive pulmonary  disease)     Emphysema  . Depression   . Posttraumatic stress disorder   . GERD (gastroesophageal reflux disease)   . Irritable bowel   . BPH (benign prostatic hyperplasia)   . Urinary tract bacterial infections   . Osteoporosis   . Hypertension   . Dyslipidemia   . CAD (coronary artery disease)     Stents placed in [place] and [place] in the past  /Myoview 2008, no ischemia, /   . Atrial flutter     History of the past with syncope and  ?? H. and consideration, no records  . 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 Hip pain   . Shoulder pain   . Gout   . Peripheral arterial disease     Dopplers Dr.Vyas office October, 2010, apparent occlusion anterior tibial arteries bilaterally., findings suggest hemodynamically significant stenosis of the proximal right provisional femoral artery and the mid left superficial artery.  Ankle-brachial indices indicate moderate stenosis within both lower extremities  . Chronic kidney disease     Creatinine 2.1, October 13, 2010--> 0.74 10/2012  . Hyperlipidemia     Mixed  . GI bleed   . Ejection fraction     EF 50-55%, echo, May, 2013, severe inferior hypokinesis  . Bilateral pulmonary embolism     a. 10/2012 - V:Q scan high prob for Bilat PE in setting of dyspnea/hemoptysis-->anticoagulation started.  . Right leg DVT     a. 10/2012 - subocclusive DVT R popliteal vein.  Marland Kitchen COPD (chronic obstructive pulmonary disease)     Past Surgical  History  Procedure Laterality Date  . Knee arthroplasty      Right  . Total knee arthroplasty      Left  . Total hip arthroplasty      Right-complicated hx  . Colon resection    . Back surgery      [place]  . Prostate surgery    . Colonoscopy N/A 03/13/2013    Procedure: COLONOSCOPY;  Surgeon: Rogene Houston, MD;  Location: [facility];  Service: Endoscopy;  Laterality: N/A;  730  . Cataract extraction      Prior to Admission medications   Medication Sig Start Date End Date Taking?  Authorizing Provider  allopurinol (ZYLOPRIM) 100 MG tablet Take 100 mg by mouth 2 (two) times daily.    Yes Historical Provider, MD  alum & mag hydroxide-simeth (MYLANTA) 200-200-20 MG/5ML suspension Take 15 mLs by mouth as needed for indigestion.    Yes Historical Provider, MD  ascorbic acid (VITAMIN C) 500 MG tablet Take 500 mg by mouth daily.    Yes Historical Provider, MD  aspirin EC 81 MG tablet Take 81 mg by mouth daily.   Yes Historical Provider, MD  calcium-vitamin D (OSCAL WITH D) 500-200 MG-UNIT per tablet Take 1 tablet by mouth daily.    Yes Historical Provider, MD  cilostazol (PLETAL) 100 MG tablet Take 100 mg by mouth daily.     Yes Historical Provider, MD  colchicine 0.6 MG tablet Take 0.6 mg by mouth daily as needed (gout flair up).    Yes Historical Provider, MD  diltiazem (CARDIZEM CD) 120 MG 24 hr capsule Take 1-2 capsules (120-240 mg total) by mouth daily. As directed based off blood pressure readings 12/16/14  Yes Carlena Bjornstad, MD  diphenoxylate-atropine (LOMOTIL) 2.5-0.025 MG per tablet Take 1 tablet by mouth every 8 (eight) hours as needed for diarrhea or loose stools.  11/18/14  Yes Historical Provider, MD  fenofibrate (TRICOR) 145 MG tablet Take 1 tablet (145 mg total) by mouth daily. 08/16/11  Yes Ezra Sites, MD  fentaNYL (DURAGESIC - DOSED MCG/HR) 100 MCG/HR Place 1 patch onto the skin every 3 (three) days. 11/02/14  Yes Historical Provider, MD  Ferrous Sulfate (RA IRON) 27 MG TABS Take 1 tablet by mouth daily.     Yes Historical Provider, MD  finasteride (PROSCAR) 5 MG tablet Take 5 mg by mouth daily.     Yes Historical Provider, MD  furosemide (LASIX) 20 MG tablet Take 2 tablets (40 mg total) by mouth daily. 11/02/13  Yes Carlena Bjornstad, MD  HYDROcodone-acetaminophen (NORCO/VICODIN) 5-325 MG per tablet Take 1-2 tablets by mouth every 6 (six) hours as needed for moderate pain or severe pain.  11/18/14  Yes Historical Provider, MD  meloxicam (MOBIC) 7.5 MG tablet Take 7.5 mg by  mouth daily.   Yes Historical Provider, MD  metoCLOPramide (REGLAN) 10 MG tablet Take 1 tablet (10 mg total) by mouth daily. 05/01/12  Yes Rogene Houston, MD  metoprolol (LOPRESSOR) 100 MG tablet Take 1 tablet (100 mg total) by mouth 2 (two) times daily. 02/07/12  Yes Donney Dice, PA-C  Multiple Vitamin (MULTIVITAMIN) tablet Take 1 tablet by mouth daily.     Yes Historical Provider, MD  nitroGLYCERIN (NITROSTAT) 0.4 MG SL tablet Place 1 tablet (0.4 mg total) under the tongue every 5 (five) minutes as needed. Patient taking differently: Place 0.4 mg under the tongue every 5 (five) minutes as needed for chest pain.  12/01/12  Yes  Carlena Bjornstad, MD  Omega-3 Fatty Acids (FISH OIL) 1000 MG CAPS Take 2 capsules by mouth daily.    Yes Historical Provider, MD  pantoprazole (PROTONIX) 40 MG tablet Take 40 mg by mouth daily.     Yes Historical Provider, MD  potassium chloride SA (K-DUR,KLOR-CON) 20 MEQ tablet Take 2 tablets (40 mEq total) by mouth daily. 11/13/13  Yes Carlena Bjornstad, MD  psyllium (METAMUCIL SMOOTH TEXTURE) 28 % packet Take 1 packet by mouth at bedtime. 03/13/13  Yes Rogene Houston, MD  Rivaroxaban (XARELTO) 20 MG TABS Take 1 tablet (20 mg total) by mouth daily. 11/08/12  Yes Reyne Dumas, MD  rosuvastatin (CRESTOR) 40 MG tablet Take 1 tablet (40 mg total) by mouth every evening. 02/07/12  Yes Donney Dice, PA-C  tamsulosin (FLOMAX) 0.4 MG CAPS capsule Take 0.4 mg by mouth daily after supper.   Yes Historical Provider, MD  metroNIDAZOLE (FLAGYL) 250 MG tablet Take 1 tablet (250 mg total) by mouth 3 (three) times daily. Patient not taking: Reported on 12/21/2014 12/09/14   Butch Penny, NP    Current Facility-Administered Medications  Medication Dose Route Frequency Provider Last Rate Last Dose  . 0.9 %  sodium chloride infusion   Intravenous Once Janece Canterbury, MD      . acetaminophen (TYLENOL) tablet 650 mg  650 mg Oral Q6H PRN Janece Canterbury, MD       Or  . acetaminophen (TYLENOL)  suppository 650 mg  650 mg Rectal Q6H PRN Janece Canterbury, MD      . allopurinol (ZYLOPRIM) tablet 100 mg  100 mg Oral BID Janece Canterbury, MD   100 mg at 12/21/14 1616  . ciprofloxacin (CIPRO) IVPB 400 mg  400 mg Intravenous Q12H Janece Canterbury, MD      . diphenoxylate-atropine (LOMOTIL) 2.5-0.025 MG per tablet 1 tablet  1 tablet Oral QID Janece Canterbury, MD   1 tablet at 12/21/14 1707  . [START ON 12/22/2014] fentaNYL (DURAGESIC - dosed mcg/hr) 100 mcg  100 mcg Transdermal Q3 days Janece Canterbury, MD      . finasteride (PROSCAR) tablet 5 mg  5 mg Oral Daily Janece Canterbury, MD   5 mg at 12/21/14 1718  . HYDROcodone-acetaminophen (NORCO/VICODIN) 5-325 MG per tablet 1-2 tablet  1-2 tablet Oral Q6H PRN Janece Canterbury, MD      . metoCLOPramide (REGLAN) injection 10 mg  10 mg Intravenous Q6H PRN Janece Canterbury, MD      . metroNIDAZOLE (FLAGYL) IVPB 500 mg  500 mg Intravenous Q8H Janece Canterbury, MD      . multivitamin with minerals tablet 1 tablet  1 tablet Oral Daily Janece Canterbury, MD   1 tablet at 12/21/14 1616  . nitroGLYCERIN (NITROSTAT) SL tablet 0.4 mg  0.4 mg Sublingual Q5 min PRN Janece Canterbury, MD      . pantoprazole (PROTONIX) injection 40 mg  40 mg Intravenous BID Janece Canterbury, MD   40 mg at 12/21/14 1616  . rosuvastatin (CRESTOR) tablet 40 mg  40 mg Oral QPM Janece Canterbury, MD   40 mg at 12/21/14 1707  . sodium chloride 0.9 % bolus 500 mL  500 mL Intravenous Once Janece Canterbury, MD      . sodium chloride 0.9 % injection 3 mL  3 mL Intravenous Q12H Janece Canterbury, MD   3 mL at 12/21/14 1616  . tamsulosin (FLOMAX) capsule 0.4 mg  0.4 mg Oral QPC supper Janece Canterbury, MD   0.4 mg at 12/21/14 1707  Allergies as of 12/21/2014 - Review Complete 12/21/2014  Allergen Reaction Noted  . Iodine    . Penicillins    . Sulfonamide derivatives      Family History  Problem Relation Age of Onset  . Diabetes Daughter   . Hypertension Daughter   . Obesity Daughter     History    Social History  . Marital Status: Married    Spouse Name: N/A  . Number of Children: N/A  . Years of Education: N/A   Occupational History  . Tour manager     Retired  . Vet     Retired   Social History Main Topics  . Smoking status: Former Smoker -- 1.00 packs/day for 30 years    Types: Cigarettes    Quit date: 08/16/2006  . Smokeless tobacco: Never Used     Comment: Patient smoked a pack a day for about 30 years  . Alcohol Use: No  . Drug Use: No  . Sexual Activity: Not on file   Other Topics Concern  . Not on file   Social History Narrative   Lives with wife at home.  Bedbound and mobilized wheelchair.  Two hours 5 days of week of HH aid.          Review of Systems: See HPI, otherwise normal ROS  Physical Exam: Temp:  [98.5 F (36.9 C)] 98.5 F (36.9 C) (05/07 0809) Pulse Rate:  [86-108] 94 (05/07 1800) Resp:  [11-20] 19 (05/07 1800) BP: (89-151)/(75-107) 109/75 mmHg (05/07 1800) SpO2:  [93 %-100 %] 97 % (05/07 1800) Weight:  [300 lb (136.079 kg)] 300 lb (136.079 kg) (05/07 0809) Last BM Date: 12/21/14 Patient is alert and appears to be comfortable. Conjunctiva is pale. Sclerae nonicteric but muddy. Oropharyngeal mucosa is unremarkable. He is edentulous. No neck masses or thyromegaly noted. Cardiac exam with regular rhythm normal S1 and S2. No murmur or gallop noted. Auscultation of lungs reveals clear breath sounds bilaterally. Abdomen is protuberant. Bowel sounds are normal. No bruits noted. On palpation abdomen is soft with mild tenderness in both iliac fossae. No organomegaly or masses. No peripheral edema or clubbing noted. Lab Results:  Recent Labs  12/21/14 0830 12/21/14 1554  WBC 15.9*  --   HGB 8.9* 8.0*  HCT 27.6* 24.4*  PLT 258  --    BMET  Recent Labs  12/21/14 0830  NA 138  K 4.3  CL 107  CO2 27  GLUCOSE 107*  BUN 24*  CREATININE 1.11  CALCIUM 8.9   LFT  Recent Labs  12/21/14 0830  PROT 5.9*  ALBUMIN 2.6*  AST 20   ALT 15*  ALKPHOS 43  BILITOT 0.4   PT/INR  Recent Labs  12/21/14 0830  LABPROT 21.4*  INR 1.84*    Studies/Results: Ct Abdomen Pelvis Wo Contrast  12/21/2014   CLINICAL DATA:  74 year old male with a history of rectal bleeding  EXAM: CT ABDOMEN AND PELVIS WITHOUT CONTRAST  TECHNIQUE: Multidetector CT imaging of the abdomen and pelvis was performed following the standard protocol without IV contrast.  COMPARISON:  CT 01/05/2011  FINDINGS: Lower chest:  Unremarkable appearance of the superficial soft tissues of the chest wall.  Heart size within normal limits. Calcifications in the distribution of the right coronary artery, circumflex coronary artery, and the LAD. Calcifications of the aortic arch. No pericardial fluid/ thickening.  Large hiatal hernia again demonstrated.  Respiratory motion somewhat limits evaluation of the lungs for small nodules. Trace right pleural effusion. No  confluent airspace disease.  Abdomen/ pelvis:  Unremarkable appearance of liver and spleen. Unremarkable appearance of pancreas. Unremarkable appearance the bilateral adrenal glands. Unremarkable gallbladder.  No evidence of hydronephrosis or nephrolithiasis. Unremarkable appearance of the course of the bilateral ureters. Distal ureters not evaluated secondary to streak artifact.  No intraperitoneal air or significant free fluid.  Urinary bladder partially distended with urine. Lobulated contour along the right may represent a diverticulum.  Surgical changes of prior partial colectomy in the region of the sigmoid. There are colonic diverticula throughout the colon. There is subtle stranding within the mesenteric adjacent to the descending colon (image 52 of series 2). Fluid filled colon, including the rectum.  Enteric contrast extends through the length of the GI system. No abnormally distended small bowel.  No mesenteric adenopathy.  No retroperitoneal adenopathy.  Bilateral fact containing inguinal hernia.  Surgical  changes of bilateral total hip arthroplasty contributing to streak artifact at the pelvis.  No displaced fracture identified. Multilevel degenerative changes of the visualized spine.  Atrophy and fatty infiltration of the right greater than left psoas musculature.  Mild infiltration of the soft tissues of the body wall, likely edema.  Atherosclerotic calcification of the abdominal aorta. No aneurysm. Calcification at the origins of the mesenteric vessels.  IMPRESSION: Subtle stranding adjacent to the descending colon/ sigmoid colon with associated diverticula, favored to represent early diverticulitis given the patient's history. Once the patient has been treated for this acute process, correlation with colonoscopy is suggested.  Surgical changes of prior partial colectomy. No evidence of obstruction.  Fluid filled colon, can be seen with enteritis/colitis. Recommend correlation with patient presentation.  Large hiatal hernia.  Atherosclerosis with evidence of coronary artery disease.  Additional findings as above.  Signed,  Dulcy Fanny. Earleen Newport, DO  [facility]  [facility]   Electronically Signed   By: Corrie Mckusick D.O.   On: 12/21/2014 12:21   I have reviewed abdominal pelvic CT films.  Assessment;  Patient is 74 year old African-American male with multiple medical problems who presents with large volume painless hematochezia. His hemoglobin has dropped by more than 3 g and he is getting ready to receive 2 units of PRBCs. Present illness started with diarrhea and small volume hematochezia about 3 weeks ago. Office evaluation two weeks ago revealed negative GI pathogen panel and he was treated with metronidazole. Differential diagnoses includes left-sided colitis given that he has mild tenderness as well as diverticular bleed. He could also have diverticulitis and therefore agree with empiric therapy with antibiotics while workup is underway. Patient is on NSAID  which is relatively contraindicated given that he is on Xarelto and low-dose aspirin.  Recommendations; Agree with plans to give him two units of PRBCs. Discontinue diphenoxylate while he is being prepped for colonoscopy. Colonoscopy on 12/22/2014 following Go[facility] prep. Will check INR with a.m. lab.    LOS: 0 days   Ozell Juhasz U  12/21/2014, 7:41 PM

## 2014-12-21 NOTE — Progress Notes (Signed)
Pt arrived to the floor from the ED with one peripheral IV access.  MD placed orders for 2 IVs to be placed, but ICU RNs were unable to find a new peripheral IV access.  PICC nurse was called for PICC placement if no IV access was found.  PICC nurse was able to find a 20 gauge IV, and told me not to cancel the PICC order and that she will round again in the morning to re-assess the need for PICC.

## 2014-12-21 NOTE — ED Provider Notes (Signed)
CSN: [id number]     Arrival date & time 12/21/14  4268 History  This chart was scribed for Orpah Greek, MD by Starleen Arms, ED Scribe. This patient was seen in room APA09/APA09 and the patient's care was started at 8:03 AM.   Chief Complaint  Patient presents with  . Rectal Bleeding    The history is provided by the patient and the EMS personnel. No language interpreter was used.    HPI Comments: Bryce Berry is a 74 y.o. male with a history of diverticulitis and irritable bowel brought in by ambulance, who presents to the Emergency Department complaining of 2 weeks of rectal bleeding and two days of diarrhea.  Patient has not been seen for this complaint but called [facility] yesterday and was told to take Immodium which he has done without relief.     PCP: Dr. Rolena Infante Past Medical History  Diagnosis Date  . Atrial fibrillation     Postop hip surgery, January 2012, digoxin and diltiazem, reverted to sinus in the hospital  . Osteoarthritis   . COPD (chronic obstructive pulmonary disease)     Emphysema  . Depression   . Posttraumatic stress disorder   . GERD (gastroesophageal reflux disease)   . Irritable bowel   . BPH (benign prostatic hyperplasia)   . Urinary tract bacterial infections   . Osteoporosis   . Hypertension   . Dyslipidemia   . CAD (coronary artery disease)     Stents placed in [place] and [place] in the past  /Myoview 2008, no ischemia, /   . Atrial flutter     History of the past with syncope and  ?? H. and consideration, no records  . 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 Hip pain   . Shoulder pain   . Gout   . Peripheral arterial disease     Dopplers Dr.Vyas office October, 2010, apparent occlusion anterior tibial arteries bilaterally., findings suggest hemodynamically significant stenosis of the proximal right provisional femoral artery and the mid left superficial artery.  Ankle-brachial indices indicate moderate stenosis within  both lower extremities  . Chronic kidney disease     Creatinine 2.1, October 13, 2010--> 0.74 10/2012  . Hyperlipidemia     Mixed  . GI bleed   . Ejection fraction     EF 50-55%, echo, May, 2013, severe inferior hypokinesis  . Bilateral pulmonary embolism     a. 10/2012 - V:Q scan high prob for Bilat PE in setting of dyspnea/hemoptysis-->anticoagulation started.  . Right leg DVT     a. 10/2012 - subocclusive DVT R popliteal vein.   Past Surgical History  Procedure Laterality Date  . Knee arthroplasty      Right  . Total knee arthroplasty      Left  . Total hip arthroplasty      Right-complicated hx  . Colon resection    . Back surgery      Bryce Berry  . Prostate surgery    . Colonoscopy N/A 03/13/2013    Procedure: COLONOSCOPY;  Surgeon: Rogene Houston, MD;  Location: [facility];  Service: Endoscopy;  Laterality: N/A;  730   Family History  Problem Relation Age of Onset  . Diabetes Daughter   . Hypertension Daughter   . Obesity Daughter    History  Substance Use Topics  . Smoking status: Former Smoker -- 1.00 packs/day for 30 years    Types: Cigarettes    Quit date: 08/16/2006  .  Smokeless tobacco: Never Used     Comment: Patient smoked a pack a day for about 30 years  . Alcohol Use: No    Review of Systems  Gastrointestinal: Positive for diarrhea and anal bleeding.  All other systems reviewed and are negative.     Allergies  Iodine; Penicillins; and Sulfonamide derivatives  Home Medications   Prior to Admission medications   Medication Sig Start Date End Date Taking? Authorizing Provider  allopurinol (ZYLOPRIM) 100 MG tablet Take 100 mg by mouth 2 (two) times daily.    Yes Historical Provider, MD  alum & mag hydroxide-simeth (MYLANTA) 200-200-20 MG/5ML suspension Take 15 mLs by mouth as needed for indigestion.    Yes Historical Provider, MD  ascorbic acid (VITAMIN C) 500 MG tablet Take 500 mg by mouth daily.    Yes Historical Provider, MD  aspirin EC  81 MG tablet Take 81 mg by mouth daily.   Yes Historical Provider, MD  calcium-vitamin D (OSCAL WITH D) 500-200 MG-UNIT per tablet Take 1 tablet by mouth daily.    Yes Historical Provider, MD  cilostazol (PLETAL) 100 MG tablet Take 100 mg by mouth daily.     Yes Historical Provider, MD  colchicine 0.6 MG tablet Take 0.6 mg by mouth daily as needed (gout flair up).    Yes Historical Provider, MD  diltiazem (CARDIZEM CD) 120 MG 24 hr capsule Take 1-2 capsules (120-240 mg total) by mouth daily. As directed based off blood pressure readings 12/16/14  Yes Carlena Bjornstad, MD  diphenoxylate-atropine (LOMOTIL) 2.5-0.025 MG per tablet Take 1 tablet by mouth every 8 (eight) hours as needed for diarrhea or loose stools.  11/18/14  Yes Historical Provider, MD  fenofibrate (TRICOR) 145 MG tablet Take 1 tablet (145 mg total) by mouth daily. 08/16/11  Yes Ezra Sites, MD  fentaNYL (DURAGESIC - DOSED MCG/HR) 100 MCG/HR Place 1 patch onto the skin every 3 (three) days. 11/02/14  Yes Historical Provider, MD  Ferrous Sulfate (RA IRON) 27 MG TABS Take 1 tablet by mouth daily.     Yes Historical Provider, MD  finasteride (PROSCAR) 5 MG tablet Take 5 mg by mouth daily.     Yes Historical Provider, MD  furosemide (LASIX) 20 MG tablet Take 2 tablets (40 mg total) by mouth daily. 11/02/13  Yes Carlena Bjornstad, MD  HYDROcodone-acetaminophen (NORCO/VICODIN) 5-325 MG per tablet Take 1-2 tablets by mouth every 6 (six) hours as needed for moderate pain or severe pain.  11/18/14  Yes Historical Provider, MD  meloxicam (MOBIC) 7.5 MG tablet Take 7.5 mg by mouth daily.   Yes Historical Provider, MD  metoCLOPramide (REGLAN) 10 MG tablet Take 1 tablet (10 mg total) by mouth daily. 05/01/12  Yes Rogene Houston, MD  metoprolol (LOPRESSOR) 100 MG tablet Take 1 tablet (100 mg total) by mouth 2 (two) times daily. 02/07/12  Yes Donney Dice, PA-C  Multiple Vitamin (MULTIVITAMIN) tablet Take 1 tablet by mouth daily.     Yes Historical Provider, MD   nitroGLYCERIN (NITROSTAT) 0.4 MG SL tablet Place 1 tablet (0.4 mg total) under the tongue every 5 (five) minutes as needed. Patient taking differently: Place 0.4 mg under the tongue every 5 (five) minutes as needed for chest pain.  12/01/12  Yes Carlena Bjornstad, MD  Omega-3 Fatty Acids (FISH OIL) 1000 MG CAPS Take 2 capsules by mouth daily.    Yes Historical Provider, MD  pantoprazole (PROTONIX) 40 MG tablet Take 40 mg by mouth  daily.     Yes Historical Provider, MD  potassium chloride SA (K-DUR,KLOR-CON) 20 MEQ tablet Take 2 tablets (40 mEq total) by mouth daily. 11/13/13  Yes Carlena Bjornstad, MD  psyllium (METAMUCIL SMOOTH TEXTURE) 28 % packet Take 1 packet by mouth at bedtime. 03/13/13  Yes Rogene Houston, MD  Rivaroxaban (XARELTO) 20 MG TABS Take 1 tablet (20 mg total) by mouth daily. 11/08/12  Yes Reyne Dumas, MD  rosuvastatin (CRESTOR) 40 MG tablet Take 1 tablet (40 mg total) by mouth every evening. 02/07/12  Yes Donney Dice, PA-C  tamsulosin (FLOMAX) 0.4 MG CAPS capsule Take 0.4 mg by mouth daily after supper.   Yes Historical Provider, MD  metroNIDAZOLE (FLAGYL) 250 MG tablet Take 1 tablet (250 mg total) by mouth 3 (three) times daily. Patient not taking: Reported on 12/21/2014 12/09/14   Butch Penny, NP   BP 135/104 mmHg  Pulse 98  Temp(Src) 98.5 F (36.9 C) (Oral)  Resp 14  Ht 6\' 2"  (1.88 m)  Wt 300 lb (136.079 kg)  BMI 38.50 kg/m2  SpO2 99% Physical Exam  Constitutional: He is oriented to person, place, and time. He appears well-developed and well-nourished. No distress.  HENT:  Head: Normocephalic and atraumatic.  Eyes: Conjunctivae and EOM are normal.  Neck: Neck supple. No tracheal deviation present.  Cardiovascular:  Irregular and tachycardic.   Pulmonary/Chest: Effort normal. No respiratory distress.  Abdominal: There is tenderness.  Diffuse abdominal tenderness.   Musculoskeletal: Normal range of motion.  Neurological: He is alert and oriented to person, place, and  time.  Skin: Skin is warm and dry.  Psychiatric: He has a normal mood and affect. His behavior is normal.  Nursing note and vitals reviewed.   ED Course  Procedures (including critical care time)  DIAGNOSTIC STUDIES: Oxygen Saturation is 96% on RA%, normal by my interpretation.    COORDINATION OF CARE:  8:06 AM Discussed treatment plan with patient at bedside.  Patient acknowledges and agrees with plan.    Labs Review Labs Reviewed  CBC WITH DIFFERENTIAL/PLATELET - Abnormal; Notable for the following:    WBC 15.9 (*)    RBC 3.14 (*)    Hemoglobin 8.9 (*)    HCT 27.6 (*)    RDW 20.4 (*)    Neutrophils Relative % 87 (*)    Neutro Abs 13.9 (*)    Lymphocytes Relative 7 (*)    All other components within normal limits  COMPREHENSIVE METABOLIC PANEL - Abnormal; Notable for the following:    Glucose, Bld 107 (*)    BUN 24 (*)    Total Protein 5.9 (*)    Albumin 2.6 (*)    ALT 15 (*)    Anion gap 4 (*)    All other components within normal limits  LIPASE, BLOOD - Abnormal; Notable for the following:    Lipase 14 (*)    All other components within normal limits  URINALYSIS, ROUTINE W REFLEX MICROSCOPIC - Abnormal; Notable for the following:    APPearance HAZY (*)    Hgb urine dipstick LARGE (*)    Leukocytes, UA LARGE (*)    All other components within normal limits  PROTIME-INR - Abnormal; Notable for the following:    Prothrombin Time 21.4 (*)    INR 1.84 (*)    All other components within normal limits  URINE MICROSCOPIC-ADD ON - Abnormal; Notable for the following:    Squamous Epithelial / LPF FEW (*)    Bacteria, UA  MANY (*)    All other components within normal limits  TROPONIN I  I-STAT CG4 LACTIC ACID, ED  TYPE AND SCREEN    Imaging Review Ct Abdomen Pelvis Wo Contrast  12/21/2014   CLINICAL DATA:  74 year old male with a history of rectal bleeding  EXAM: CT ABDOMEN AND PELVIS WITHOUT CONTRAST  TECHNIQUE: Multidetector CT imaging of the abdomen and pelvis was  performed following the standard protocol without IV contrast.  COMPARISON:  CT 01/05/2011  FINDINGS: Lower chest:  Unremarkable appearance of the superficial soft tissues of the chest wall.  Heart size within normal limits. Calcifications in the distribution of the right coronary artery, circumflex coronary artery, and the LAD. Calcifications of the aortic arch. No pericardial fluid/ thickening.  Large hiatal hernia again demonstrated.  Respiratory motion somewhat limits evaluation of the lungs for small nodules. Trace right pleural effusion. No confluent airspace disease.  Abdomen/ pelvis:  Unremarkable appearance of liver and spleen. Unremarkable appearance of pancreas. Unremarkable appearance the bilateral adrenal glands. Unremarkable gallbladder.  No evidence of hydronephrosis or nephrolithiasis. Unremarkable appearance of the course of the bilateral ureters. Distal ureters not evaluated secondary to streak artifact.  No intraperitoneal air or significant free fluid.  Urinary bladder partially distended with urine. Lobulated contour along the right may represent a diverticulum.  Surgical changes of prior partial colectomy in the region of the sigmoid. There are colonic diverticula throughout the colon. There is subtle stranding within the mesenteric adjacent to the descending colon (image 52 of series 2). Fluid filled colon, including the rectum.  Enteric contrast extends through the length of the GI system. No abnormally distended small bowel.  No mesenteric adenopathy.  No retroperitoneal adenopathy.  Bilateral fact containing inguinal hernia.  Surgical changes of bilateral total hip arthroplasty contributing to streak artifact at the pelvis.  No displaced fracture identified. Multilevel degenerative changes of the visualized spine.  Atrophy and fatty infiltration of the right greater than left psoas musculature.  Mild infiltration of the soft tissues of the body wall, likely edema.  Atherosclerotic  calcification of the abdominal aorta. No aneurysm. Calcification at the origins of the mesenteric vessels.  IMPRESSION: Subtle stranding adjacent to the descending colon/ sigmoid colon with associated diverticula, favored to represent early diverticulitis given the patient's history. Once the patient has been treated for this acute process, correlation with colonoscopy is suggested.  Surgical changes of prior partial colectomy. No evidence of obstruction.  Fluid filled colon, can be seen with enteritis/colitis. Recommend correlation with patient presentation.  Large hiatal hernia.  Atherosclerosis with evidence of coronary artery disease.  Additional findings as above.  Signed,  Dulcy Fanny. Earleen Newport, DO  [facility]  [facility]   Electronically Signed   By: Corrie Mckusick D.O.   On: 12/21/2014 12:21     EKG Interpretation   Date/Time:  Saturday Dec 21 2014 08:02:44 EDT Ventricular Rate:  88 PR Interval:    QRS Duration: 83 QT Interval:  326 QTC Calculation: 394 R Axis:   20 Text Interpretation:  Atrial fibrillation Low voltage, precordial leads  Borderline T abnormalities, inferior leads No significant change since  last tracing Confirmed by Leotha Voeltz  MD, Osmel Dykstra [phone]) on 12/21/2014  8:06:01 AM      MDM   Final diagnoses:  Abdominal pain, acute  Diverticulitis of large intestine without perforation or abscess with bleeding  Gastrointestinal hemorrhage, unspecified gastritis, unspecified gastrointestinal hemorrhage type  HX: anticoagulation   Patient presents to the ER for evaluation  of abdominal pain, diarrhea and rectal bleeding. Patient has history of chronic diarrhea. He was seen by his gastroenterologist this week and was initiated on Imodium and Flagyl. He continues to have the diarrhea and today developed maroon bloody stools. He presents with gross blood apparent at examination. He is hemodynamically stable, however. Patient has  leukocytosis, WBC equals 15.9. He also is anemic with a hemoglobin of 8.9. This is concerning because the patient does take Xarelto because of history of atrial fibrillation. CT scan shows diverticular disease with surrounding stranding consistent with diverticulitis. Patient will be admitted to the hospital initiated on Cipro and Flagyl IV.  I personally performed the services described in this documentation, which was scribed in my presence. The recorded information has been reviewed and is accurate.      Orpah Greek, MD 12/21/14 1315

## 2014-12-21 NOTE — ED Notes (Addendum)
Cleaned patient with incontinent cleanser. Full linen and incontinent pad changed. Received consent by patient and wife to throw away sheets patient brought from home.

## 2014-12-21 NOTE — ED Notes (Signed)
Hospitalist at bedside 

## 2014-12-21 NOTE — ED Notes (Signed)
Wife now in room. Per wife patient has had diarrhea "for a while," more prevalent in past 2 weeks. Per wife patient seen by Dr Olevia Perches NP 2 weeks ago and given imodium and Flagyl to take. Per wife noticed dark tarry stools yesterday but thought it could be due to the piptobismol he took. Patient states continuous blood stools started last night. EDP aware of history and that wife is now in room.

## 2014-12-21 NOTE — ED Notes (Signed)
Cleaned patient and top sheet/bed pad changed. Pt eliminated large amount of dark loose stool.

## 2014-12-21 NOTE — H&P (Addendum)
[facility] History and Physical  Bryce Berry DUK:[id number] DOB: 1941-07-31 DOA: 12/21/2014  Referring physician:  Malachy Moan PCP:  Glenda Chroman., MD   Chief Complaint:  Rectal bleeding  HPI:  The patient is a 74 y.o. year-old male with history of chronic anticoagulation with xarelto secondary to chronic a-fib/a-flutter and bilateral PE, CAD s/p 4 stents, diverticulosis s/p partial sigmoidectomy 20 years ago, internal hemorrhoids, COPD, gout, recurrent UTI who presents with diarrhea and rectal bleeding.  In 2014 he was hospitalized for rectal bleeding and was found to have bilateral PEs, right lower extremity DVT, A. fib with RVR.  He had upper and lower endoscopy and was found to have diverticulosis, internal hemorrhoids, and multiple gastric erosions and a small gastric ulcer.  At that time he was started on Xarelto. He has been followed by Dr. Ron Parker for his a-flutter/flutter and Dr. Laural Golden for his diverticulosis.    Over the last few years, he has become progressively more disabled and is currently essentially bedbound and gets around with a motorized wheelchair. His wife cares for him.   Over the last few months, he has had intermittent diarrhea. He was seen by GI 2 weeks ago, at which time he had a negative GI pathogen panel. He was started on Flagyl and Imodium which helped.  He completed his course a couple of days prior to admission.  Since stopping his medication he has had a return of his watery stools, 5 watery bowel movements per day. His wife contacted his primary care doctor who recommended that he start Pepto-Bismol. This caused his stools to turn dark black. The night prior to admission, he developed severe left upper and left lower quadrant cramping which would come and go lasting for a few minutes at a time. Overnight, his wife states that he was continuously having moderate volume watery black stools with some pink tinge and streaks of blood.  He denies fevers, chills. He  has had some nausea without vomiting. He has been on antibiotics for urinary tract infection in the last 3 months. He denies respiratory symptoms. He has not exerted himself because he is bedbound and therefore is uncertain about dyspnea on exertion. He denies lightheadedness, dizziness.  In the emergency department, vital signs notable for heart rate in the low 100s, blood pressure stable. Labs notable for white blood cell count of 15.9, hemoglobin of 8.9, BUN of 24, INR 1.84 on Xarelto. CT scan of the abdomen and pelvis demonstrated stranding adjacent to the descending colon and sigmoid colon with associated diverticula suggestive of early diverticulitis. He had a fluid-filled colon.  He had multiple black stools mixed with blood in the emergency department and is being admitted to stepdown for GIB.    Review of Systems:  General:  Denies fevers, chills, weight loss or gain HEENT:  Denies changes to hearing and vision, rhinorrhea, sinus congestion, sore throat CV:  Denies chest pain and palpitations, chronic stable lower extremity edema.  PULM:  Denies SOB, wheezing, cough.   GI:  Per history of present illness.   GU:  Denies dysuria, frequency, urgency ENDO:  Denies polyuria, polydipsia.   HEME:  Denies hematemesis LYMPH:  Denies lymphadenopathy.   MSK:  Chronic arthralgias, myalgias.   DERM:  Denies skin rash or ulcer.   NEURO:  Denies focal numbness, weakness, slurred speech, confusion, facial droop.  Diffusely weak. PSYCH:  Denies anxiety and depression.    Past Medical History  Diagnosis Date  . Atrial fibrillation  Postop hip surgery, January 2012, digoxin and diltiazem, reverted to sinus in the hospital  . Osteoarthritis   . COPD (chronic obstructive pulmonary disease)     Emphysema  . Depression   . Posttraumatic stress disorder   . GERD (gastroesophageal reflux disease)   . Irritable bowel   . BPH (benign prostatic hyperplasia)   . Urinary tract bacterial infections   .  Osteoporosis   . Hypertension   . Dyslipidemia   . CAD (coronary artery disease)     Stents placed in [place] and [place] in the past  /Myoview 2008, no ischemia, /   . Atrial flutter     History of the past with syncope and  ?? H. and consideration, no records  . 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 Hip pain   . Shoulder pain   . Gout   . Peripheral arterial disease     Dopplers Dr.Vyas office October, 2010, apparent occlusion anterior tibial arteries bilaterally., findings suggest hemodynamically significant stenosis of the proximal right provisional femoral artery and the mid left superficial artery.  Ankle-brachial indices indicate moderate stenosis within both lower extremities  . Chronic kidney disease     Creatinine 2.1, October 13, 2010--> 0.74 10/2012  . Hyperlipidemia     Mixed  . GI bleed   . Ejection fraction     EF 50-55%, echo, May, 2013, severe inferior hypokinesis  . Bilateral pulmonary embolism     a. 10/2012 - V:Q scan high prob for Bilat PE in setting of dyspnea/hemoptysis-->anticoagulation started.  . Right leg DVT     a. 10/2012 - subocclusive DVT R popliteal vein.  Marland Kitchen COPD (chronic obstructive pulmonary disease)    Past Surgical History  Procedure Laterality Date  . Knee arthroplasty      Right  . Total knee arthroplasty      Left  . Total hip arthroplasty      Right-complicated hx  . Colon resection    . Back surgery      [place]  . Prostate surgery    . Colonoscopy N/A 03/13/2013    Procedure: COLONOSCOPY;  Surgeon: Rogene Houston, MD;  Location: [facility];  Service: Endoscopy;  Laterality: N/A;  730  . Cataract extraction     Social History:  reports that he quit smoking about 8 years ago. His smoking use included Cigarettes. He has a 30 pack-year smoking history. He has never used smokeless tobacco. He reports that he does not drink alcohol or use illicit drugs. Lives with wife at home.  Bedbound and mobilized wheelchair.  Two  hours 5 days of week of HH aid.  Allergies  Allergen Reactions  . Iodine     REACTION: rash  . Penicillins     Rapid heart rate; flushed  . Sulfonamide Derivatives     Rapid heart rate; flushed    Family History  Problem Relation Age of Onset  . Diabetes Daughter   . Hypertension Daughter   . Obesity Daughter      Prior to Admission medications   Medication Sig Start Date End Date Taking? Authorizing Provider  allopurinol (ZYLOPRIM) 100 MG tablet Take 100 mg by mouth 2 (two) times daily.    Yes Historical Provider, MD  alum & mag hydroxide-simeth (MYLANTA) 200-200-20 MG/5ML suspension Take 15 mLs by mouth as needed for indigestion.    Yes Historical Provider, MD  ascorbic acid (VITAMIN C) 500 MG tablet Take 500 mg by mouth daily.  Yes Historical Provider, MD  aspirin EC 81 MG tablet Take 81 mg by mouth daily.   Yes Historical Provider, MD  calcium-vitamin D (OSCAL WITH D) 500-200 MG-UNIT per tablet Take 1 tablet by mouth daily.    Yes Historical Provider, MD  cilostazol (PLETAL) 100 MG tablet Take 100 mg by mouth daily.     Yes Historical Provider, MD  colchicine 0.6 MG tablet Take 0.6 mg by mouth daily as needed (gout flair up).    Yes Historical Provider, MD  diltiazem (CARDIZEM CD) 120 MG 24 hr capsule Take 1-2 capsules (120-240 mg total) by mouth daily. As directed based off blood pressure readings 12/16/14  Yes Carlena Bjornstad, MD  diphenoxylate-atropine (LOMOTIL) 2.5-0.025 MG per tablet Take 1 tablet by mouth every 8 (eight) hours as needed for diarrhea or loose stools.  11/18/14  Yes Historical Provider, MD  fenofibrate (TRICOR) 145 MG tablet Take 1 tablet (145 mg total) by mouth daily. 08/16/11  Yes Ezra Sites, MD  fentaNYL (DURAGESIC - DOSED MCG/HR) 100 MCG/HR Place 1 patch onto the skin every 3 (three) days. 11/02/14  Yes Historical Provider, MD  Ferrous Sulfate (RA IRON) 27 MG TABS Take 1 tablet by mouth daily.     Yes Historical Provider, MD  finasteride (PROSCAR) 5 MG  tablet Take 5 mg by mouth daily.     Yes Historical Provider, MD  furosemide (LASIX) 20 MG tablet Take 2 tablets (40 mg total) by mouth daily. 11/02/13  Yes Carlena Bjornstad, MD  HYDROcodone-acetaminophen (NORCO/VICODIN) 5-325 MG per tablet Take 1-2 tablets by mouth every 6 (six) hours as needed for moderate pain or severe pain.  11/18/14  Yes Historical Provider, MD  meloxicam (MOBIC) 7.5 MG tablet Take 7.5 mg by mouth daily.   Yes Historical Provider, MD  metoCLOPramide (REGLAN) 10 MG tablet Take 1 tablet (10 mg total) by mouth daily. 05/01/12  Yes Rogene Houston, MD  metoprolol (LOPRESSOR) 100 MG tablet Take 1 tablet (100 mg total) by mouth 2 (two) times daily. 02/07/12  Yes Donney Dice, PA-C  Multiple Vitamin (MULTIVITAMIN) tablet Take 1 tablet by mouth daily.     Yes Historical Provider, MD  nitroGLYCERIN (NITROSTAT) 0.4 MG SL tablet Place 1 tablet (0.4 mg total) under the tongue every 5 (five) minutes as needed. Patient taking differently: Place 0.4 mg under the tongue every 5 (five) minutes as needed for chest pain.  12/01/12  Yes Carlena Bjornstad, MD  Omega-3 Fatty Acids (FISH OIL) 1000 MG CAPS Take 2 capsules by mouth daily.    Yes Historical Provider, MD  pantoprazole (PROTONIX) 40 MG tablet Take 40 mg by mouth daily.     Yes Historical Provider, MD  potassium chloride SA (K-DUR,KLOR-CON) 20 MEQ tablet Take 2 tablets (40 mEq total) by mouth daily. 11/13/13  Yes Carlena Bjornstad, MD  psyllium (METAMUCIL SMOOTH TEXTURE) 28 % packet Take 1 packet by mouth at bedtime. 03/13/13  Yes Rogene Houston, MD  Rivaroxaban (XARELTO) 20 MG TABS Take 1 tablet (20 mg total) by mouth daily. 11/08/12  Yes Reyne Dumas, MD  rosuvastatin (CRESTOR) 40 MG tablet Take 1 tablet (40 mg total) by mouth every evening. 02/07/12  Yes Donney Dice, PA-C  tamsulosin (FLOMAX) 0.4 MG CAPS capsule Take 0.4 mg by mouth daily after supper.   Yes Historical Provider, MD  metroNIDAZOLE (FLAGYL) 250 MG tablet Take 1 tablet (250 mg total)  by mouth 3 (three) times daily. Patient not taking:  Reported on 12/21/2014 12/09/14   Butch Penny, NP   Physical Exam: Filed Vitals:   12/21/14 1300 12/21/14 1330 12/21/14 1345 12/21/14 1423  BP: 136/89 132/83  141/93  Pulse: 93  105 105  Temp:      TempSrc:      Resp: 16  16 15   Height:      Weight:      SpO2: 97%  93% 97%  Body mass index is 38.5 kg/(m^2).    General:  Obese male, NAD  Eyes:  PERRL, anicteric, non-injected.  ENT:  Nares clear.  OP clear, non-erythematous without plaques or exudates.  MMM.  Pallor of gums and lips  Neck:  Supple without TM or JVD.    Lymph:  No cervical, supraclavicular, or submandibular LAD.  Cardiovascular:  IRRR, normal S1, S2, without m/r/g.  2+ pulses, warm extremities  Respiratory:  CTA bilaterally without increased WOB.  Abdomen:  Hyperactive BS.  Soft, ND, TTP in the LUQ and to the left of the umbilicus.      Skin:  No rashes or focal lesions.  Musculoskeletal:  Normal bulk and tone.  2+ soft pitting bilateral LE edema.  Psychiatric:  A & O x 4.  Appropriate affect.  Neurologic:  CN 3-12 intact.  3-4/5 strength throughout.  Sensation intact.  Labs on Admission:  Basic Metabolic Panel:  Recent Labs Lab 12/21/14 0830  NA 138  K 4.3  CL 107  CO2 27  GLUCOSE 107*  BUN 24*  CREATININE 1.11  CALCIUM 8.9   Liver Function Tests:  Recent Labs Lab 12/21/14 0830  AST 20  ALT 15*  ALKPHOS 43  BILITOT 0.4  PROT 5.9*  ALBUMIN 2.6*    Recent Labs Lab 12/21/14 0830  LIPASE 14*   No results for input(s): AMMONIA in the last 168 hours. CBC:  Recent Labs Lab 12/21/14 0830  WBC 15.9*  NEUTROABS 13.9*  HGB 8.9*  HCT 27.6*  MCV 87.9  PLT 258   Cardiac Enzymes:  Recent Labs Lab 12/21/14 0830  TROPONINI <0.03    BNP (last 3 results) No results for input(s): BNP in the last 8760 hours.  ProBNP (last 3 results) No results for input(s): PROBNP in the last 8760 hours.  CBG: No results for input(s):  GLUCAP in the last 168 hours.  Radiological Exams on Admission: Ct Abdomen Pelvis Wo Contrast  12/21/2014   CLINICAL DATA:  74 year old male with a history of rectal bleeding  EXAM: CT ABDOMEN AND PELVIS WITHOUT CONTRAST  TECHNIQUE: Multidetector CT imaging of the abdomen and pelvis was performed following the standard protocol without IV contrast.  COMPARISON:  CT 01/05/2011  FINDINGS: Lower chest:  Unremarkable appearance of the superficial soft tissues of the chest wall.  Heart size within normal limits. Calcifications in the distribution of the right coronary artery, circumflex coronary artery, and the LAD. Calcifications of the aortic arch. No pericardial fluid/ thickening.  Large hiatal hernia again demonstrated.  Respiratory motion somewhat limits evaluation of the lungs for small nodules. Trace right pleural effusion. No confluent airspace disease.  Abdomen/ pelvis:  Unremarkable appearance of liver and spleen. Unremarkable appearance of pancreas. Unremarkable appearance the bilateral adrenal glands. Unremarkable gallbladder.  No evidence of hydronephrosis or nephrolithiasis. Unremarkable appearance of the course of the bilateral ureters. Distal ureters not evaluated secondary to streak artifact.  No intraperitoneal air or significant free fluid.  Urinary bladder partially distended with urine. Lobulated contour along the right may represent a diverticulum.  Surgical  changes of prior partial colectomy in the region of the sigmoid. There are colonic diverticula throughout the colon. There is subtle stranding within the mesenteric adjacent to the descending colon (image 52 of series 2). Fluid filled colon, including the rectum.  Enteric contrast extends through the length of the GI system. No abnormally distended small bowel.  No mesenteric adenopathy.  No retroperitoneal adenopathy.  Bilateral fact containing inguinal hernia.  Surgical changes of bilateral total hip arthroplasty contributing to streak  artifact at the pelvis.  No displaced fracture identified. Multilevel degenerative changes of the visualized spine.  Atrophy and fatty infiltration of the right greater than left psoas musculature.  Mild infiltration of the soft tissues of the body wall, likely edema.  Atherosclerotic calcification of the abdominal aorta. No aneurysm. Calcification at the origins of the mesenteric vessels.  IMPRESSION: Subtle stranding adjacent to the descending colon/ sigmoid colon with associated diverticula, favored to represent early diverticulitis given the patient's history. Once the patient has been treated for this acute process, correlation with colonoscopy is suggested.  Surgical changes of prior partial colectomy. No evidence of obstruction.  Fluid filled colon, can be seen with enteritis/colitis. Recommend correlation with patient presentation.  Large hiatal hernia.  Atherosclerosis with evidence of coronary artery disease.  Additional findings as above.  Signed,  Dulcy Fanny. Earleen Newport, DO  [facility]  [facility]   Electronically Signed   By: Corrie Mckusick D.O.   On: 12/21/2014 12:21    EKG: Independently reviewed.  Rate controlled a-flutter with isolated T-wave inversion in III, stable from prior  Assessment/Plan Principal Problem:   Melena Active Problems:   Hyperlipidemia   Essential hypertension   Atrial flutter   Peripheral vascular disease   COPD (chronic obstructive pulmonary disease)   CAD (coronary artery disease)   Gout   Gastroparesis   HX: anticoagulation   Diverticulitis  ---  Diverticulosis with diverticulitis and diverticular hemorrhage causing acute blood loss anemia.  Stools have become more maroon colored and large volume.  Dark black appearance was probably due to pepto and less likely due to UGIB -  Monitor in stepdown -  Hold anticoagulation, NSAIDs -  Cycle H&H every 6 hours, baseline hemoglobin unknown -  Strict I/O -  CLD -   PPI BID -  Type and screen completed -  Transfuse to keep hemoglobin greater than 8 or for or evidence of hemodynamic instability with ongoing GI bleed -  Cipro/Flagyl -  Repeat C. Diff but if negative can d/c enteric precautions -  Hold diuretics -  GI consult  CAD with atherosclerosis  -  Hold aspirin due to GIB -  Hold beta blocker due to hypotension  -  Continue statin  HTN/HLD, blood pressure wnl -  Hold beta blocker and CCB -  Continue statin -  Hold fibrate and fish oil for now  A-fib/flutter, rate controlled currently -  Hold BB and dilt -  Holding xarelto due to GIB  PAD, stable -  Hold pletal  History of PE/DVT in 2014 -  Hold anticoagulation.  No need for urgent IVC filter since this was several years go  Polypharmacy with 28 medications on his intake list for this admission -  Will try to taper down to essential medications only to reduce pill burden  Gout, stable, continue allopurinol.  No need for colchicine at this time  GERD with gastric erosions and small ulcer, stable, PPI as above  BPH, stable, continue finasteride and  flomax  Severe gastroparesis, reglan prn   Leukocytosis, may be secondary to diverticulitis, possible UTI or reactive from anemia -  abx and repeat WBC in AM -  F/u urine culture  Difficult stick/difficult IV access  Diet:  CLD Access:  PIV, PICC line ordered IVF:  off Proph:  SCDs  Code Status: full Family Communication: patient, his wife and daughter Disposition Plan: Admit to stepdown  Time spent: 60 min Janece Canterbury [facility] Pager [phone]  If 7PM-7AM, please contact night-coverage [email or web address] Password [email or web address] 12/21/2014, 3:08 PM

## 2014-12-21 NOTE — ED Notes (Signed)
Patient brought in from home via EMS. C/o intermittent rectal bleeding x2 weeks, diarrhea x2 days-poor historian (wife is primary caregiver, will be here shortly per EMS).er patient called Bryce Berry and was told to take imodium. Patient c/o pain "all over" with pain mostly in shoulder. Patient reports taking vicodin last night at 11. Patient tender to palpation of abd.

## 2014-12-22 ENCOUNTER — Encounter (HOSPITAL_COMMUNITY)
Admission: EM | Disposition: A | Discharge status: Home / Self Care | Payer: Self-pay | Source: Home / Self Care | Attending: Internal Medicine

## 2014-12-22 ENCOUNTER — Inpatient Hospital Stay (HOSPITAL_COMMUNITY): Payer: Medicare Other

## 2014-12-22 ENCOUNTER — Encounter (HOSPITAL_COMMUNITY): Payer: Self-pay | Admitting: *Deleted

## 2014-12-22 ENCOUNTER — Encounter (HOSPITAL_COMMUNITY): Payer: Medicare Other

## 2014-12-22 DIAGNOSIS — A0472 Enterocolitis due to Clostridium difficile, not specified as recurrent: Secondary | ICD-10-CM | POA: Diagnosis present

## 2014-12-22 DIAGNOSIS — K5730 Diverticulosis of large intestine without perforation or abscess without bleeding: Secondary | ICD-10-CM

## 2014-12-22 DIAGNOSIS — K5733 Diverticulitis of large intestine without perforation or abscess with bleeding: Secondary | ICD-10-CM

## 2014-12-22 HISTORY — PX: COLONOSCOPY: SHX5424

## 2014-12-22 LAB — BASIC METABOLIC PANEL
ANION GAP: 5 (ref 5–15)
BUN: 21 mg/dL — ABNORMAL HIGH (ref 6–20)
CO2: 28 mmol/L (ref 22–32)
Calcium: 8.8 mg/dL — ABNORMAL LOW (ref 8.9–10.3)
Chloride: 105 mmol/L (ref 101–111)
Creatinine, Ser: 0.92 mg/dL (ref 0.61–1.24)
Glucose, Bld: 99 mg/dL (ref 70–99)
POTASSIUM: 4.4 mmol/L (ref 3.5–5.1)
SODIUM: 138 mmol/L (ref 135–145)

## 2014-12-22 LAB — URINE CULTURE

## 2014-12-22 LAB — CBC
HCT: 27.7 % — ABNORMAL LOW (ref 39.0–52.0)
Hemoglobin: 9.1 g/dL — ABNORMAL LOW (ref 13.0–17.0)
MCH: 28.6 pg (ref 26.0–34.0)
MCHC: 32.9 g/dL (ref 30.0–36.0)
MCV: 87.1 fL (ref 78.0–100.0)
Platelets: 233 10*3/uL (ref 150–400)
RBC: 3.18 MIL/uL — ABNORMAL LOW (ref 4.22–5.81)
RDW: 18.1 % — AB (ref 11.5–15.5)
WBC: 13.3 10*3/uL — ABNORMAL HIGH (ref 4.0–10.5)

## 2014-12-22 LAB — HEMOGLOBIN AND HEMATOCRIT, BLOOD
HCT: 22.7 % — ABNORMAL LOW (ref 39.0–52.0)
Hemoglobin: 7.6 g/dL — ABNORMAL LOW (ref 13.0–17.0)

## 2014-12-22 LAB — PROTIME-INR
INR: 1.36 (ref 0.00–1.49)
PROTHROMBIN TIME: 16.9 s — AB (ref 11.6–15.2)

## 2014-12-22 LAB — PREPARE RBC (CROSSMATCH)

## 2014-12-22 LAB — CLOSTRIDIUM DIFFICILE BY PCR: Toxigenic C. Difficile by PCR: POSITIVE — AB

## 2014-12-22 SURGERY — COLONOSCOPY
Anesthesia: Moderate Sedation

## 2014-12-22 MED ORDER — SODIUM CHLORIDE 0.9 % IV SOLN: Freq: Once | INTRAVENOUS | Status: DC

## 2014-12-22 MED ORDER — METOPROLOL TARTRATE 25 MG PO TABS
25.0000 mg | ORAL_TABLET | Freq: Two times a day (BID) | ORAL | Status: DC
  Administered 2014-12-22 – 2014-12-23 (×2): 25 mg via ORAL
  Filled 2014-12-22 (×2): qty 1

## 2014-12-22 MED ORDER — MEPERIDINE HCL 50 MG/ML IJ SOLN
INTRAMUSCULAR | Status: DC | PRN
  Administered 2014-12-22: 25 mg via INTRAVENOUS

## 2014-12-22 MED ORDER — SODIUM CHLORIDE 0.9 % IJ SOLN
INTRAMUSCULAR | Status: AC
  Filled 2014-12-22: qty 12

## 2014-12-22 MED ORDER — METRONIDAZOLE 500 MG PO TABS
500.0000 mg | ORAL_TABLET | Freq: Three times a day (TID) | ORAL | Status: DC
  Administered 2014-12-22 – 2014-12-25 (×9): 500 mg via ORAL
  Filled 2014-12-22 (×9): qty 1

## 2014-12-22 MED ORDER — TECHNETIUM TC 99M-LABELED RED BLOOD CELLS IV KIT
25.0000 | PACK | Freq: Once | INTRAVENOUS | Status: AC | PRN
  Administered 2014-12-22: 26 via INTRAVENOUS

## 2014-12-22 MED ORDER — SODIUM CHLORIDE 0.9 % IJ SOLN
10.0000 mL | INTRAMUSCULAR | Status: DC | PRN
  Filled 2014-12-22: qty 40

## 2014-12-22 MED ORDER — HEPARIN SOD (PORK) LOCK FLUSH 100 UNIT/ML IV SOLN
INTRAVENOUS | Status: AC
  Filled 2014-12-22: qty 5

## 2014-12-22 MED ORDER — STERILE WATER FOR IRRIGATION IR SOLN
Status: DC | PRN
  Administered 2014-12-22: 10:00:00

## 2014-12-22 MED ORDER — SODIUM CHLORIDE 0.9 % IV SOLN
INTRAVENOUS | Status: DC
Start: 2014-12-22 — End: 2014-12-25
  Administered 2014-12-22: 14:00:00 via INTRAVENOUS

## 2014-12-22 MED ORDER — MIDAZOLAM HCL 5 MG/5ML IJ SOLN
INTRAMUSCULAR | Status: AC
  Filled 2014-12-22: qty 10

## 2014-12-22 MED ORDER — SODIUM CHLORIDE 0.9 % IV SOLN: Freq: Once | INTRAVENOUS | Status: AC

## 2014-12-22 MED ORDER — SODIUM CHLORIDE 0.9 % IJ SOLN
10.0000 mL | Freq: Two times a day (BID) | INTRAMUSCULAR | Status: DC
  Administered 2014-12-22 – 2014-12-24 (×5): 10 mL
  Administered 2014-12-25: 30 mL

## 2014-12-22 MED ORDER — MEPERIDINE HCL 50 MG/ML IJ SOLN
INTRAMUSCULAR | Status: AC
  Filled 2014-12-22: qty 1

## 2014-12-22 MED ORDER — MIDAZOLAM HCL 5 MG/5ML IJ SOLN
INTRAMUSCULAR | Status: DC | PRN
  Administered 2014-12-22: 2 mg via INTRAVENOUS
  Administered 2014-12-22: 1 mg via INTRAVENOUS

## 2014-12-22 NOTE — Progress Notes (Addendum)
[facility] PROGRESS NOTE  Bryce Berry IRW:[id number] DOB: 09/17/1940 DOA: 12/21/2014 PCP: Glenda Chroman., MD  Assessment/Plan: 1. GI bleeding. Suspect lower GI bleeding. CT scan indicated diverticulitis. GI is following and plans on colonoscopy today. Wife reports that he had continued bleeding overnight. Continue supportive treatments. Anticoagulation currently on hold. NSAIDs on hold and will discontinue indefinitely in the setting of anticoagulation. 2. Acute diverticulitis with associated hemorrhage. Patient is on ciprofloxacin and Flagyl. Stool studies have been ordered. Continue current treatments. 3. Acute blood loss anemia. Last Hemoglobin was noted to be 11 from 10/2012. On admission, hemoglobin noted to be low at 8.9, this fell to 8.0. He was transfused 2 units PRBCs with improvement in hemoglobin to 9.1. Continue to follow and transfuse as necessary 4. Atrial fib/flutter. Heart rate appears to be elevated. He has not received his rate control medications including metoprolol and diltiazem. We'll restart low-dose Lopressor. Blood pressure appears to be stable. Anticoagulation on hold. 5. Coronary artery disease. Hold aspirin due to GI bleeding. Will restart low-dose beta blocker. 6. GERD. Continue proton pump inhibitors. 7. History of PE/DVT in 2014. Patient's wife reports that he has a history of DVT as well as bilateral pulmonary emboli. Since the patient is essentially bedbound/wheelchair bound, he would certainly be at high risk for developing DVTs. We'll need to discuss with gastroenterology when it would be safe to restart anticoagulation. 8. Gout. Stable. Continue allopurinol. 9. BPH. Stable. Continue finasteride and Flomax.  Code Status: full code Family Communication: discussed with patient and wife at the bedside Disposition Plan: pending hospital course, likely return home on discharge   Consultants: gastroenterology  Procedures:    Antibiotics:  cipro  5/7>>  Flagyl 5/7>>  HPI/Subjective: Patient continues to have bloody stools overnight. He denies any abdominal pain. No shortness of breath, chest pain or lightheadness  Objective: Filed Vitals:   12/22/14 0600  BP: 161/107  Pulse:   Temp:   Resp: 18    Intake/Output Summary (Last 24 hours) at 12/22/14 0901 Last data filed at 12/22/14 0759  Gross per 24 hour  Intake   1686 ml  Output    300 ml  Net   1386 ml   Filed Weights   12/21/14 0809  Weight: 136.079 kg (300 lb)    Exam:   General:  NAD  Cardiovascular: S1, S2 irregular  Respiratory: cta b  Abdomen: soft, nt, nd, bs+  Musculoskeletal: 2+ pitting edema.  Data Reviewed: Basic Metabolic Panel:  Recent Labs Lab 12/21/14 0830 12/22/14 0739  NA 138 138  K 4.3 4.4  CL 107 105  CO2 27 28  GLUCOSE 107* 99  BUN 24* 21*  CREATININE 1.11 0.92  CALCIUM 8.9 8.8*   Liver Function Tests:  Recent Labs Lab 12/21/14 0830  AST 20  ALT 15*  ALKPHOS 43  BILITOT 0.4  PROT 5.9*  ALBUMIN 2.6*    Recent Labs Lab 12/21/14 0830  LIPASE 14*   No results for input(s): AMMONIA in the last 168 hours. CBC:  Recent Labs Lab 12/21/14 0830 12/21/14 1554 12/22/14 0739  WBC 15.9*  --  13.3*  NEUTROABS 13.9*  --   --   HGB 8.9* 8.0* 9.1*  HCT 27.6* 24.4* 27.7*  MCV 87.9  --  87.1  PLT 258  --  233   Cardiac Enzymes:  Recent Labs Lab 12/21/14 0830  TROPONINI <0.03   BNP (last 3 results) No results for input(s): BNP in the last 8760 hours.  ProBNP (last  3 results) No results for input(s): PROBNP in the last 8760 hours.  CBG: No results for input(s): GLUCAP in the last 168 hours.  Recent Results (from the past 240 hour(s))  MRSA PCR Screening     Status: Abnormal   Collection Time: 12/21/14  2:25 PM  Result Value Ref Range Status   MRSA by PCR POSITIVE (A) NEGATIVE Final    Comment:        The GeneXpert MRSA Assay (FDA approved for NASAL specimens only), is one component of  a comprehensive MRSA colonization surveillance program. It is not intended to diagnose MRSA infection nor to guide or monitor treatment for MRSA infections. RESULT CALLED TO, READ BACK BY AND VERIFIED WITH: HAMITT,S AT 2105 ON 12/21/2014 BY MOSLEY,J      Studies: Ct Abdomen Pelvis Wo Contrast  12/21/2014   CLINICAL DATA:  74 year old male with a history of rectal bleeding  EXAM: CT ABDOMEN AND PELVIS WITHOUT CONTRAST  TECHNIQUE: Multidetector CT imaging of the abdomen and pelvis was performed following the standard protocol without IV contrast.  COMPARISON:  CT 01/05/2011  FINDINGS: Lower chest:  Unremarkable appearance of the superficial soft tissues of the chest wall.  Heart size within normal limits. Calcifications in the distribution of the right coronary artery, circumflex coronary artery, and the LAD. Calcifications of the aortic arch. No pericardial fluid/ thickening.  Large hiatal hernia again demonstrated.  Respiratory motion somewhat limits evaluation of the lungs for small nodules. Trace right pleural effusion. No confluent airspace disease.  Abdomen/ pelvis:  Unremarkable appearance of liver and spleen. Unremarkable appearance of pancreas. Unremarkable appearance the bilateral adrenal glands. Unremarkable gallbladder.  No evidence of hydronephrosis or nephrolithiasis. Unremarkable appearance of the course of the bilateral ureters. Distal ureters not evaluated secondary to streak artifact.  No intraperitoneal air or significant free fluid.  Urinary bladder partially distended with urine. Lobulated contour along the right may represent a diverticulum.  Surgical changes of prior partial colectomy in the region of the sigmoid. There are colonic diverticula throughout the colon. There is subtle stranding within the mesenteric adjacent to the descending colon (image 52 of series 2). Fluid filled colon, including the rectum.  Enteric contrast extends through the length of the GI system. No abnormally  distended small bowel.  No mesenteric adenopathy.  No retroperitoneal adenopathy.  Bilateral fact containing inguinal hernia.  Surgical changes of bilateral total hip arthroplasty contributing to streak artifact at the pelvis.  No displaced fracture identified. Multilevel degenerative changes of the visualized spine.  Atrophy and fatty infiltration of the right greater than left psoas musculature.  Mild infiltration of the soft tissues of the body wall, likely edema.  Atherosclerotic calcification of the abdominal aorta. No aneurysm. Calcification at the origins of the mesenteric vessels.  IMPRESSION: Subtle stranding adjacent to the descending colon/ sigmoid colon with associated diverticula, favored to represent early diverticulitis given the patient's history. Once the patient has been treated for this acute process, correlation with colonoscopy is suggested.  Surgical changes of prior partial colectomy. No evidence of obstruction.  Fluid filled colon, can be seen with enteritis/colitis. Recommend correlation with patient presentation.  Large hiatal hernia.  Atherosclerosis with evidence of coronary artery disease.  Additional findings as above.  Signed,  Dulcy Fanny. Earleen Newport, DO  [facility]  [facility]   Electronically Signed   By: Corrie Mckusick D.O.   On: 12/21/2014 12:21    Scheduled Meds: . allopurinol  100 mg Oral BID  .  Chlorhexidine Gluconate Cloth  6 each Topical Q0600  . ciprofloxacin  400 mg Intravenous Q12H  . fentaNYL  100 mcg Transdermal Q3 days  . finasteride  5 mg Oral Daily  . metronidazole  500 mg Intravenous Q8H  . multivitamin with minerals  1 tablet Oral Daily  . mupirocin ointment  1 application Nasal BID  . pantoprazole (PROTONIX) IV  40 mg Intravenous BID  . rosuvastatin  40 mg Oral QPM  . sodium chloride  500 mL Intravenous Once  . sodium chloride  3 mL Intravenous Q12H  . tamsulosin  0.4 mg Oral QPC supper   Continuous  Infusions:   Principal Problem:   Melena Active Problems:   Hyperlipidemia   Essential hypertension   Atrial flutter   Peripheral vascular disease   COPD (chronic obstructive pulmonary disease)   CAD (coronary artery disease)   Gout   Gastroparesis   HX: anticoagulation   Diverticulitis    Time spent: 40mins    Ridley Dileo  [facility] Pager [phone]. If 7PM-7AM, please contact night-coverage at [email or web address], password [facility] 12/22/2014, 9:01 AM  LOS: 1 day    Addendum:  Informed by staff that bleeding scan was negative for active bleeding. Hemoglobin is lower now than on previous checks. Will transfuse another 2 unit prbc.  Tylar Amborn

## 2014-12-22 NOTE — Op Note (Signed)
COLONOSCOPY PROCEDURE REPORT  PATIENT:  Bryce Berry  MR#:  [id number] Birthdate:  03/17/1941, 74 y.o., male Endoscopist:  Dr. Rogene [place], MD Referred By:  Dr. Edwin Cap, MD  Procedure Date: 12/22/2014  Procedure:   Colonoscopy  Indications:  Patient is 74 year old Caucasian male with multiple medical problems who presents with painless large-volume hematochezia. Patient has been on serology, low-dose aspirin as well as meloxicam and Pletal. He has received 2 units of PRBCs and his hemoglobin has increased from low 8 g to 9.1 g. He has known colonic diverticulosis which is felt to be a source of blood loss. He is undergoing colonoscopy with therapeutic intention.  Informed Consent:  The procedure and risks were reviewed with the patient and informed consent was obtained.  Medications:  Demerol 25 mg IV Versed 3 mg IV  Description of procedure:  After a digital rectal exam was performed, that colonoscope was advanced from the anus through the rectum and colon to the area of the cecum, ileocecal valve and appendiceal orifice. The cecum was deeply intubated. These structures were well-seen and photographed for the record. From the level of the cecum and ileocecal valve, the scope was slowly and cautiously withdrawn. The mucosal surfaces were carefully surveyed utilizing scope tip to flexion to facilitate fold flattening as needed. The scope was pulled down into the rectum where a thorough exam including retroflexion was performed. Terminal ileum was also examined.  Findings:   Prep suboptimal with fresh blood as well as clots throughout the colon. Fresh blood noted in terminal ileum. Scattered diverticula throughout the colon but none with bleeding.    Therapeutic/Diagnostic Maneuvers Performed:   None  Complications:    Cecal Withdrawal Time:  11 minutes  Impression:  Fresh blood noted in terminal ileum. Blood and clots noted throughout the colon but no bleeding lesion  identified. Scattered diverticula throughout the colon.  Recommendations:  GI bleeding scan ASAP. If bleeding scan is positive will consider transfer to [facility] for abdominal angiography.  Karry Barrilleaux U  12/22/2014 10:34 AM  CC: Dr. Glenda Chroman., MD & Dr. Rayne Du ref. provider found

## 2014-12-22 NOTE — Progress Notes (Signed)
GI bleeding scan is negative. C. difficile by PCR is positive. Please note patient did not have any endoscopic evidence of C. difficile colitis. We will switch him to oral metronidazole and continue IV Cipro. Begin clear liquids.

## 2014-12-22 NOTE — Procedures (Signed)
Central Venous Catheter Insertion Procedure Note Bryce Berry [id number] 1940-08-21  Procedure: Insertion of Central Venous Catheter Indications: Assessment of intravascular volume, Drug and/or fluid administration and Frequent blood sampling  Procedure Details Consent: Risks of procedure as well as the alternatives and risks of each were explained to the (patient/caregiver).  Consent for procedure obtained. Time Out: Verified patient identification, verified procedure, site/side was marked, verified correct patient position, special equipment/implants available, medications/allergies/relevent history reviewed, required imaging and test results available.  Performed  Maximum sterile technique was used including antiseptics, cap, gloves, gown, hand hygiene, mask and sheet. Skin prep: Chlorhexidine; local anesthetic administered A antimicrobial bonded/coated triple lumen catheter was placed in the right femoral vein due to multiple attempts, no other available access using the Seldinger technique.  Evaluation Blood flow good Complications: No apparent complications Patient did tolerate procedure well.   [name] A 12/22/2014, 3:11 PM

## 2014-12-22 NOTE — Progress Notes (Signed)
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 No basilic pinhead sized brachial and cephalic. Assessing other arm same visual. But arm appeared better to manipulate. Attempting 4 sticks in brachial unsuccessfully, than attempted basilic got in vessel but unable to thread picc would make it to shoulder and stop. Tried radiology wire to make picc more stable still would not go past right below shoulder. Notified dr Laural Golden he said to let dr Roderic Palau aware to discuss with dr Laural Golden and possible have dr Arnoldo Morale placed a central line.

## 2014-12-23 ENCOUNTER — Encounter (HOSPITAL_COMMUNITY): Payer: Self-pay | Admitting: Internal Medicine

## 2014-12-23 DIAGNOSIS — A047 Enterocolitis due to Clostridium difficile: Secondary | ICD-10-CM

## 2014-12-23 DIAGNOSIS — J449 Chronic obstructive pulmonary disease, unspecified: Secondary | ICD-10-CM

## 2014-12-23 LAB — TYPE AND SCREEN
ABO/RH(D): O NEG
Antibody Screen: NEGATIVE
UNIT DIVISION: 0
Unit division: 0
Unit division: 0
Unit division: 0

## 2014-12-23 LAB — BASIC METABOLIC PANEL
Anion gap: 4 — ABNORMAL LOW (ref 5–15)
BUN: 18 mg/dL (ref 6–20)
CALCIUM: 8.5 mg/dL — AB (ref 8.9–10.3)
CO2: 28 mmol/L (ref 22–32)
CREATININE: 0.88 mg/dL (ref 0.61–1.24)
Chloride: 106 mmol/L (ref 101–111)
GFR calc Af Amer: >60 mL/min (ref >60)
GLUCOSE: 107 mg/dL — AB (ref 70–99)
Potassium: 3.8 mmol/L (ref 3.5–5.1)
Sodium: 138 mmol/L (ref 135–145)

## 2014-12-23 LAB — HEMOGLOBIN AND HEMATOCRIT, BLOOD
HCT: 28.3 % — ABNORMAL LOW (ref 39.0–52.0)
HCT: 29.3 % — ABNORMAL LOW (ref 39.0–52.0)
HCT: 28.6 % — ABNORMAL LOW (ref 39.0–52.0)
HCT: 28 % — ABNORMAL LOW (ref 39.0–52.0)
HEMATOCRIT: 28.8 % — AB (ref 39.0–52.0)
HEMOGLOBIN: 9.7 g/dL — AB (ref 13.0–17.0)
Hemoglobin: 9.7 g/dL — ABNORMAL LOW (ref 13.0–17.0)
Hemoglobin: 9.7 g/dL — ABNORMAL LOW (ref 13.0–17.0)
Hemoglobin: 9.5 g/dL — ABNORMAL LOW (ref 13.0–17.0)
Hemoglobin: 9.4 g/dL — ABNORMAL LOW (ref 13.0–17.0)

## 2014-12-23 LAB — CBC
HCT: 28.6 % — ABNORMAL LOW (ref 39.0–52.0)
Hemoglobin: 9.6 g/dL — ABNORMAL LOW (ref 13.0–17.0)
MCH: 28.8 pg (ref 26.0–34.0)
MCHC: 33.6 g/dL (ref 30.0–36.0)
MCV: 85.9 fL (ref 78.0–100.0)
Platelets: 182 10*3/uL (ref 150–400)
RBC: 3.33 MIL/uL — AB (ref 4.22–5.81)
RDW: 17 % — ABNORMAL HIGH (ref 11.5–15.5)
WBC: 11.5 10*3/uL — ABNORMAL HIGH (ref 4.0–10.5)

## 2014-12-23 MED ORDER — METOPROLOL TARTRATE 50 MG PO TABS
50.0000 mg | ORAL_TABLET | Freq: Two times a day (BID) | ORAL | Status: DC
  Administered 2014-12-23: 50 mg via ORAL
  Filled 2014-12-23: qty 1

## 2014-12-23 MED ORDER — FUROSEMIDE 40 MG PO TABS
40.0000 mg | ORAL_TABLET | Freq: Every day | ORAL | Status: DC
  Administered 2014-12-23 – 2014-12-25 (×3): 40 mg via ORAL
  Filled 2014-12-23 (×3): qty 1

## 2014-12-23 MED ORDER — SODIUM CHLORIDE 0.9 % IV SOLN
INTRAVENOUS | Status: DC
  Administered 2014-12-23: 17:00:00 via INTRAVENOUS

## 2014-12-23 NOTE — Evaluation (Signed)
Physical Therapy Evaluation Patient Details Name: Bryce Berry MRN: [id number] DOB: [birth year] Today's Date: 12/23/2014   History of Present Illness  The patient is a 74 y.o. year-old male with history of chronic anticoagulation with xarelto secondary to chronic a-fib/a-flutter and bilateral PE, CAD s/p 4 stents, diverticulosis s/p partial sigmoidectomy 20 years ago, internal hemorrhoids, COPD, gout, recurrent UTI who presents with diarrhea and rectal bleeding. In 2014 he was hospitalized for rectal bleeding and was found to have bilateral PEs, right lower extremity DVT, A. fib with RVR. He had upper and lower endoscopy and was found to have diverticulosis, internal hemorrhoids, and multiple gastric erosions and a small gastric ulcer. At that time he was started on Xarelto. He has been followed by Dr. Ron Parker for his a-flutter/flutter and Dr. Laural Golden for his diverticulosis. Over the last few years, he has become progressively more disabled and is currently essentially bedbound and gets around with a motorized wheelchair. His wife cares for him.   Clinical Impression   Pt was seen for evaluation.  He is very pleasant and cooperative, morbidly obese.  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  He normally uses a sliding board for transfer bed to chair.  We were unable to assess this due to the difficulty in using this modality with our soft beds and recliner chairs.  I offered to have a PT in home health come out to ensure his ability to still transfer with wife's assist but he declines this.  He feels that he will not have any difficulty with this transfer.    Follow Up Recommendations No PT follow up    Equipment Recommendations  None recommended by PT    Recommendations for Other Services   none    Precautions / Restrictions Precautions Precautions: Fall Precaution Comments: c diff precautions Restrictions Weight  Bearing Restrictions: No      Mobility  Bed Mobility               General bed mobility comments: not tested....unable to do a sliding board transfer from hospital bed that pt is in...he declines getting out of bed with lift  Transfers                    Ambulation/Gait                Stairs            Wheelchair Mobility    Modified Rankin (Stroke Patients Only)       Balance                                             Pertinent Vitals/Pain Pain Assessment: No/denies pain    Home Living Family/patient expects to be discharged to:: Private residence   Available Help at Discharge: Family;Personal care attendant Type of Home: House Home Access: Ramped entrance Entrance Stairs-Rails: None   Home Layout: One level Home Equipment: Environmental consultant - 2 wheels;Wheelchair - power;Hospital bed      Prior Function Level of Independence: Needs assistance   Gait / Transfers Assistance Needed: Pt transfers surface->surface using transfer board with wife's assistance   ADL's / Homemaking Assistance Needed: Pt's wife and HH aid provide max assistance for B/IADL needs  Hand Dominance   Dominant Hand: Right    Extremity/Trunk Assessment               Lower Extremity Assessment: RLE deficits/detail;LLE deficits/detail RLE Deficits / Details: minimal hip and knee 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...significant weakness throughout LLE Deficits / Details: strength generally 2/5     Communication   Communication: No difficulties  Cognition Arousal/Alertness: Awake/alert Behavior During Therapy: WFL for tasks assessed/performed Overall Cognitive Status: Within Functional Limits for tasks assessed                      General Comments      Exercises        Assessment/Plan    PT Assessment Patent does not need any further PT services  PT Diagnosis     PT Problem List    PT Treatment Interventions     PT  Goals (Current goals can be found in the Care Plan section) Acute Rehab PT Goals PT Goal Formulation: All assessment and education complete, DC therapy    Frequency     Barriers to discharge        Co-evaluation               End of Session   Activity Tolerance: Patient tolerated treatment well Patient left: in bed;with call bell/phone within reach;with bed alarm set           Time: 1538-1601 PT Time Calculation (min) (ACUTE ONLY): 23 min   Charges:   PT Evaluation $Initial PT Evaluation Tier I: 1 Procedure     PT G CodesDemetrios Isaacs L 12/23/2014, 4:10 PM

## 2014-12-23 NOTE — Progress Notes (Addendum)
Patient is agreeable to proceeding with small bowel given capsule study should be scheduled for 12/24/2014. My biggest concern is that he may have incomplete study secondary to gastroparesis. Patient is on IV metoclopramide which should help.

## 2014-12-23 NOTE — Progress Notes (Signed)
  Subjective:  Patient has no complaints. He had 3 bowel movements since last night. First two were large bowel movements and the third one was small with old blood. He denies abdominal pain nausea or vomiting. He also denies chest pain or shortness of breath.  Objective: Blood pressure 144/89, pulse 91, temperature 97.9 F (36.6 C), temperature source Oral, resp. rate 19, height 6\' 2"  (1.88 m), weight 301 lb 13 oz (136.9 kg), SpO2 100 %. Patient is alert and appears to be comfortable. Abdomen is obese but soft and nontender without organomegaly or masses. No LE edema or clubbing noted.  Labs/studies Results:   Recent Labs  12/21/14 0830  12/22/14 0739 12/22/14 1545 12/23/14 0200 12/23/14 0557  WBC 15.9*  --  13.3*  --   --  11.5*  HGB 8.9*  < > 9.1* 7.6* 9.7* 9.6*  9.7*  HCT 27.6*  < > 27.7* 22.7* 28.3* 28.6*  28.8*  PLT 258  --  233  --   --  182  < > = values in this interval not displayed.  BMET   Recent Labs  12/21/14 0830 12/22/14 0739 12/23/14 0557  NA 138 138 138  K 4.3 4.4 3.8  CL 107 105 106  CO2 27 28 28   GLUCOSE 107* 99 107*  BUN 24* 21* 18  CREATININE 1.11 0.92 0.88  CALCIUM 8.9 8.8* 8.5*    LFT   Recent Labs  12/21/14 0830  PROT 5.9*  ALBUMIN 2.6*  AST 20  ALT 15*  ALKPHOS 43  BILITOT 0.4    PT/INR   Recent Labs  12/21/14 0830 12/22/14 0739  LABPROT 21.4* 16.9*  INR 1.84* 1.36     Assessment:  #1. GI bleed possibly from colonic diverticulosis or small bowel. Bleeding site not identified on colonoscopy yesterday. Subsequent GI bleeding scan was negative. Patient has received 4 units of PRBCs. H&H has been stable over the last few hours. #2. Anemia secondary to GI bleed but he also has an element of anemia of chronic disease.  Recommendations;  Advance diet. Will continue to monitor H&H. If he remains stable will consider small bowel given capsule study tomorrow.

## 2014-12-23 NOTE — Progress Notes (Signed)
Pt a/o.vss. Resting in bed watching tv. IV patent. No complaints of any distress. Report given to [facility], RN. Pt to be transferred via bed to room 337.

## 2014-12-23 NOTE — Care Management Note (Signed)
Case Management Note  Patient Details  Name: CHADWIN FURY MRN: [id number] Date of Birth: 01-Dec-1940   Expected Discharge Date:  12/24/14               Expected Discharge Plan:  [place]  In-House Referral:  NA  Discharge planning Services  CM Consult  Post Acute Care Choice:  Resumption of Svcs/PTA Provider Choice offered to:     DME Arranged:    DME Agency:     HH Arranged:    [place] Agency:     Status of Service:  In process, will continue to follow  Medicare Important Message Given:    Date Medicare IM Given:    Medicare IM give by:    Date Additional Medicare IM Given:    Additional Medicare Important Message give by:     If discussed at [place] of Stay Meetings, dates discussed:    Additional Comments: Pt is from home, lives with wife. Pt is dependent at baseline. Pt wheelchair bound, uses a sliding board to get into his electric wheelchair. Pt says he has no DME needs. Pt has CAP aid through [facility] that comes 2 hrs a day and his wife cares for him. Pt anticipates DC home with resumption of previous arrangements. No CM needs identified at this time, will cont to follow.  Sherald Barge, RN 12/23/2014, 3:08 PM

## 2014-12-23 NOTE — Progress Notes (Signed)
Occupational Therapy Evaluation Patient Details Name: Bryce Berry MRN: [id number] DOB: 1941/05/23 Today's Date: 12/23/2014    History of Present Illness The patient is a 74 y.o. year-old male with history of chronic anticoagulation with xarelto secondary to chronic a-fib/a-flutter and bilateral PE, CAD s/p 4 stents, diverticulosis s/p partial sigmoidectomy 20 years ago, internal hemorrhoids, COPD, gout, recurrent UTI who presents with diarrhea and rectal bleeding. In 2014 he was hospitalized for rectal bleeding and was found to have bilateral PEs, right lower extremity DVT, A. fib with RVR. He had upper and lower endoscopy and was found to have diverticulosis, internal hemorrhoids, and multiple gastric erosions and a small gastric ulcer. At that time he was started on Xarelto. He has been followed by Dr. Ron Parker for his a-flutter/flutter and Dr. Laural Golden for his diverticulosis. Over the last few years, he has become progressively more disabled and is currently essentially bedbound and gets around with a motorized wheelchair. His wife cares for him.    Clinical Impression   PTA pt lived at home with wife. Pt awake and alert this am, wife present. Pt demonstrates limited range of motion and strength of BUE due to pain from arthritis at baseline. Pt does report tingling and numbness in right hand, onset approximately 2-3 months ago. Pt and wife report pt requires max assist with B/IADL tasks at baseline, HH aid assists with bathing and dressing each day. Pt completes BUE exercises with theraband every day to maintain ability to assist in transfers using sliding board. Pt is at baseline with B/IADL tasks, no further acute OT services required at this time.     Follow Up Recommendations  No OT follow up;Supervision/Assistance - 24 hour    Equipment Recommendations  None recommended by OT       Precautions / Restrictions Restrictions Weight Bearing Restrictions: No              ADL Overall  ADL's : Needs assistance/impaired;At baseline                                     Functional mobility during ADLs: Caregiver able to provide necessary level of assistance (motorized w/c ) General ADL Comments: Pt and wife report wife and HH aid provide max assistance in all B/IADL tasks. Pt uses motorized w/c for functional mobility purposes and transfers using sliding board with wife's assistance.      Vision Vision Assessment?: No apparent visual deficits       Hand Dominance Right   Extremity/Trunk Assessment Upper Extremity Assessment Upper Extremity Assessment: Generalized weakness;RUE deficits/detail RUE Deficits / Details: Pt reports tingling & numbness in tips of fingers of RUE.    Lower Extremity Assessment Lower Extremity Assessment: Defer to PT evaluation       Communication Communication Communication: No difficulties   Cognition Arousal/Alertness: Awake/alert Behavior During Therapy: WFL for tasks assessed/performed Overall Cognitive Status: Within Functional Limits for tasks assessed                                Home Living Family/patient expects to be discharged to:: Private residence Living Arrangements: Spouse/significant other Available Help at Discharge: Family;Personal care attendant Type of Home: House  Prior Functioning/Environment Level of Independence: Needs assistance  Gait / Transfers Assistance Needed: Pt transfers surface->surface using transfer board with wife's assistance  ADL's / Homemaking Assistance Needed: Pt's wife and HH aid provide max assistance for B/IADL needs         OT Diagnosis: Generalized weakness   OT Problem List: Decreased strength;Decreased range of motion;Decreased activity tolerance;Obesity;Pain    End of Session    Activity Tolerance: Patient limited by pain Patient left: in bed;with call bell/phone within reach;with family/visitor  present;with nursing/sitter in room   Time: 7588-3254 OT Time Calculation (min): 25 min Charges:  OT Evaluation $Initial OT Evaluation Tier I: 1 Procedure $OT Re-eval: 1 Procedure  Guadelupe Sabin, OTR/L  [phone]  12/23/2014, 9:31 AM

## 2014-12-23 NOTE — Progress Notes (Signed)
[facility] PROGRESS NOTE  Bryce Berry QBH:[id number] DOB: [birth year] DOA: 12/21/2014 PCP: Glenda Chroman., MD  Assessment/Plan: 1. GI bleeding. CT scan indicated diverticulitis. Patient underwent colonoscopy and no clear source of bleeding was identified. He subsequently had a bleeding scan done which did not reveal any active bleeding. Patient is being considered for capsule endoscopy. Continue supportive treatments. Anticoagulation currently on hold. NSAIDs on hold and would discontinue indefinitely in the setting of anticoagulation. 2. Acute diverticulitis with associated hemorrhage. Patient is on ciprofloxacin and Flagyl. Stool for C diff returned positive. Continue current treatments. 3. C diff positive stools. Continue on flagyl and florastor. 4. Acute blood loss anemia. Last Hemoglobin was noted to be 11 from 10/2012. On admission, hemoglobin noted to be low at 8.9, which fell to a low of 7.6 on 5/8. He has been transfused a total of 4 units prbc during his hospitalization. Continue to follow and transfuse as necessary 5. Atrial fib/flutter.Continue rate control medications with lopressor. Blood pressure appears to be stable. Anticoagulation on hold. 6. Coronary artery disease. Hold aspirin due to GI bleeding. On low-dose beta blocker. 7. GERD. Continue proton pump inhibitors. 8. History of PE/DVT in 2014. Patient's wife reports that he has a history of DVT as well as bilateral pulmonary emboli. Since the patient is essentially bedbound/wheelchair bound, he would certainly be at high risk for developing DVTs. We'll need to discuss with gastroenterology when/if it would be safe to restart anticoagulation. 9. Gout. Stable. Continue allopurinol. 10. BPH. Stable. Continue finasteride and Flomax.  Code Status: full code Family Communication: discussed with patient and wife at the bedside Disposition Plan: pending hospital course, likely return home on  discharge   Consultants: gastroenterology  Procedures: Colonoscopy 5/8: Prep suboptimal with fresh blood as well as clots throughout the colon. Fresh blood noted in terminal ileum.  Scattered diverticula throughout the colon but none with bleeding.  Antibiotics:  cipro 5/7>>  Flagyl 5/7>>  HPI/Subjective: Patient had a bowel movement containing blood last night. He had another small bowel movement this morning with a small amount of blood. Denies any shortness of breath or chest pain  Objective: Filed Vitals:   12/23/14 0800  BP: 144/89  Pulse: 91  Temp: 97.9 F (36.6 C)  Resp: 19    Intake/Output Summary (Last 24 hours) at 12/23/14 1028 Last data filed at 12/23/14 0600  Gross per 24 hour  Intake 1812.17 ml  Output   1000 ml  Net 812.17 ml   Filed Weights   12/21/14 0809 12/23/14 0500  Weight: 136.079 kg (300 lb) 136.9 kg (301 lb 13 oz)    Exam:   General:  NAD, laying in bed  Cardiovascular: S1, S2 irregular  Respiratory: clear bilaterally, normal resp effort  Abdomen: soft, nt, nd, bs+  Musculoskeletal: 2+ pitting edema.  Data Reviewed: Basic Metabolic Panel:  Recent Labs Lab 12/21/14 0830 12/22/14 0739 12/23/14 0557  NA 138 138 138  K 4.3 4.4 3.8  CL 107 105 106  CO2 27 28 28   GLUCOSE 107* 99 107*  BUN 24* 21* 18  CREATININE 1.11 0.92 0.88  CALCIUM 8.9 8.8* 8.5*   Liver Function Tests:  Recent Labs Lab 12/21/14 0830  AST 20  ALT 15*  ALKPHOS 43  BILITOT 0.4  PROT 5.9*  ALBUMIN 2.6*    Recent Labs Lab 12/21/14 0830  LIPASE 14*   No results for input(s): AMMONIA in the last 168 hours. CBC:  Recent Labs Lab 12/21/14 0830  12/22/14 0739 12/22/14  1545 12/23/14 0200 12/23/14 0557 12/23/14 0904  WBC 15.9*  --  13.3*  --   --  11.5*  --   NEUTROABS 13.9*  --   --   --   --   --   --   HGB 8.9*  < > 9.1* 7.6* 9.7* 9.6*  9.7* 9.7*  HCT 27.6*  < > 27.7* 22.7* 28.3* 28.6*  28.8* 29.3*  MCV 87.9  --  87.1  --   --  85.9   --   PLT 258  --  233  --   --  182  --   < > = values in this interval not displayed. Cardiac Enzymes:  Recent Labs Lab 12/21/14 0830  TROPONINI <0.03   BNP (last 3 results) No results for input(s): BNP in the last 8760 hours.  ProBNP (last 3 results) No results for input(s): PROBNP in the last 8760 hours.  CBG: No results for input(s): GLUCAP in the last 168 hours.  Recent Results (from the past 240 hour(s))  Urine culture     Status: None   Collection Time: 12/21/14  8:48 AM  Result Value Ref Range Status   Specimen Description URINE, CLEAN CATCH  Final   Special Requests NONE  Final   Colony Count   Final    >=100,000 COLONIES/ML Performed at [facility]    Culture   Final    Multiple bacterial morphotypes present, none predominant. Suggest appropriate recollection if clinically indicated. Performed at [facility]    Report Status 12/22/2014 FINAL  Final  MRSA PCR Screening     Status: Abnormal   Collection Time: 12/21/14  2:25 PM  Result Value Ref Range Status   MRSA by PCR POSITIVE (A) NEGATIVE Final    Comment:        The GeneXpert MRSA Assay (FDA approved for NASAL specimens only), is one component of a comprehensive MRSA colonization surveillance program. It is not intended to diagnose MRSA infection nor to guide or monitor treatment for MRSA infections. RESULT CALLED TO, READ BACK BY AND VERIFIED WITH: HAMITT,S AT 2105 ON 12/21/2014 BY MOSLEY,J   Clostridium Difficile by PCR     Status: Abnormal   Collection Time: 12/22/14  8:15 AM  Result Value Ref Range Status   C difficile by pcr POSITIVE (A) NEGATIVE Final    Comment: CRITICAL RESULT CALLED TO, READ BACK BY AND VERIFIED WITH: M.GREY AT 1242 ON 12/22/14 BY S.VANHOORNE      Studies: Ct Abdomen Pelvis Wo Contrast  12/21/2014   CLINICAL DATA:  74 year old male with a history of rectal bleeding  EXAM: CT ABDOMEN AND PELVIS WITHOUT CONTRAST  TECHNIQUE: Multidetector CT imaging of the  abdomen and pelvis was performed following the standard protocol without IV contrast.  COMPARISON:  CT 01/05/2011  FINDINGS: Lower chest:  Unremarkable appearance of the superficial soft tissues of the chest wall.  Heart size within normal limits. Calcifications in the distribution of the right coronary artery, circumflex coronary artery, and the LAD. Calcifications of the aortic arch. No pericardial fluid/ thickening.  Large hiatal hernia again demonstrated.  Respiratory motion somewhat limits evaluation of the lungs for small nodules. Trace right pleural effusion. No confluent airspace disease.  Abdomen/ pelvis:  Unremarkable appearance of liver and spleen. Unremarkable appearance of pancreas. Unremarkable appearance the bilateral adrenal glands. Unremarkable gallbladder.  No evidence of hydronephrosis or nephrolithiasis. Unremarkable appearance of the course of the bilateral ureters. Distal ureters not evaluated secondary to streak  artifact.  No intraperitoneal air or significant free fluid.  Urinary bladder partially distended with urine. Lobulated contour along the right may represent a diverticulum.  Surgical changes of prior partial colectomy in the region of the sigmoid. There are colonic diverticula throughout the colon. There is subtle stranding within the mesenteric adjacent to the descending colon (image 52 of series 2). Fluid filled colon, including the rectum.  Enteric contrast extends through the length of the GI system. No abnormally distended small bowel.  No mesenteric adenopathy.  No retroperitoneal adenopathy.  Bilateral fact containing inguinal hernia.  Surgical changes of bilateral total hip arthroplasty contributing to streak artifact at the pelvis.  No displaced fracture identified. Multilevel degenerative changes of the visualized spine.  Atrophy and fatty infiltration of the right greater than left psoas musculature.  Mild infiltration of the soft tissues of the body wall, likely edema.   Atherosclerotic calcification of the abdominal aorta. No aneurysm. Calcification at the origins of the mesenteric vessels.  IMPRESSION: Subtle stranding adjacent to the descending colon/ sigmoid colon with associated diverticula, favored to represent early diverticulitis given the patient's history. Once the patient has been treated for this acute process, correlation with colonoscopy is suggested.  Surgical changes of prior partial colectomy. No evidence of obstruction.  Fluid filled colon, can be seen with enteritis/colitis. Recommend correlation with patient presentation.  Large hiatal hernia.  Atherosclerosis with evidence of coronary artery disease.  Additional findings as above.  Signed,  Dulcy Fanny. Earleen Newport, DO  [facility]  [facility]   Electronically Signed   By: Corrie Mckusick D.O.   On: 12/21/2014 12:21   Nm Gi Blood Loss  12/22/2014   CLINICAL DATA:  75 year old male with a history of diverticular disease and GI bleeding.  EXAM: NUCLEAR MEDICINE GASTROINTESTINAL BLEEDING SCAN  TECHNIQUE: Sequential abdominal images were obtained following intravenous administration of Tc-2m labeled red blood cells.  RADIOPHARMACEUTICALS:  Twenty-six mCi Tc-78m in-vitro labeled red cells.  COMPARISON:  CT 12/21/2014  FINDINGS: Planar imaging of the abdomen and pelvis performed after administered a shin of radial labeled red blood cells.  120 min of imaging was performed after administration of the radiotracer, with normal blood pool activity identified. Urinary bladder identified. Liver and heart identified.  No accumulation of radiotracer outside of the normal blood pool or solid organs to suggest bleeding.  IMPRESSION: Negative radio labeled red blood cell study for GI bleeding.  Signed,  Dulcy Fanny. Earleen Newport, DO  [facility]  [facility]   Electronically Signed   By: Corrie Mckusick D.O.   On: 12/22/2014 18:38    Scheduled Meds: .  sodium chloride   Intravenous Once  . allopurinol  100 mg Oral BID  . Chlorhexidine Gluconate Cloth  6 each Topical Q0600  . ciprofloxacin  400 mg Intravenous Q12H  . fentaNYL  100 mcg Transdermal Q3 days  . finasteride  5 mg Oral Daily  . metoprolol tartrate  25 mg Oral BID  . metroNIDAZOLE  500 mg Oral 3 times per day  . multivitamin with minerals  1 tablet Oral Daily  . mupirocin ointment  1 application Nasal BID  . pantoprazole (PROTONIX) IV  40 mg Intravenous BID  . rosuvastatin  40 mg Oral QPM  . sodium chloride  500 mL Intravenous Once  . sodium chloride  10-40 mL Intracatheter Q12H  . sodium chloride  3 mL Intravenous Q12H  . tamsulosin  0.4 mg Oral QPC supper  Continuous Infusions: . sodium chloride 20 mL/hr at 12/23/14 0600    Principal Problem:   Melena Active Problems:   Hyperlipidemia   Essential hypertension   Atrial flutter   Peripheral vascular disease   COPD (chronic obstructive pulmonary disease)   CAD (coronary artery disease)   Gout   Gastroparesis   HX: anticoagulation   Diverticulitis   Diverticulitis of large intestine without perforation or abscess with bleeding   Clostridium difficile colitis    Time spent: 42mins    Milli Woolridge  [facility] Pager [phone]. If 7PM-7AM, please contact night-coverage at [email or web address], password [facility] 12/23/2014, 10:28 AM  LOS: 2 days

## 2014-12-24 ENCOUNTER — Encounter (HOSPITAL_COMMUNITY)
Admission: EM | Disposition: A | Discharge status: Home / Self Care | Payer: Self-pay | Source: Home / Self Care | Attending: Internal Medicine

## 2014-12-24 DIAGNOSIS — D62 Acute posthemorrhagic anemia: Secondary | ICD-10-CM | POA: Diagnosis present

## 2014-12-24 HISTORY — PX: GIVENS CAPSULE STUDY: SHX5432

## 2014-12-24 LAB — CBC
HCT: 27.7 % — ABNORMAL LOW (ref 39.0–52.0)
Hemoglobin: 9.3 g/dL — ABNORMAL LOW (ref 13.0–17.0)
MCH: 29.4 pg (ref 26.0–34.0)
MCHC: 33.6 g/dL (ref 30.0–36.0)
MCV: 87.7 fL (ref 78.0–100.0)
PLATELETS: 181 10*3/uL (ref 150–400)
RBC: 3.16 MIL/uL — ABNORMAL LOW (ref 4.22–5.81)
RDW: 17.9 % — AB (ref 11.5–15.5)
WBC: 12.7 10*3/uL — AB (ref 4.0–10.5)

## 2014-12-24 LAB — BASIC METABOLIC PANEL
Anion gap: 6 (ref 5–15)
BUN: 14 mg/dL (ref 6–20)
CALCIUM: 8.6 mg/dL — AB (ref 8.9–10.3)
CO2: 26 mmol/L (ref 22–32)
Chloride: 107 mmol/L (ref 101–111)
Creatinine, Ser: 0.84 mg/dL (ref 0.61–1.24)
GFR calc non Af Amer: >60 mL/min (ref >60)
Glucose, Bld: 107 mg/dL — ABNORMAL HIGH (ref 70–99)
POTASSIUM: 3.7 mmol/L (ref 3.5–5.1)
Sodium: 139 mmol/L (ref 135–145)

## 2014-12-24 LAB — HEMOGLOBIN AND HEMATOCRIT, BLOOD
HCT: 28.1 % — ABNORMAL LOW (ref 39.0–52.0)
HCT: 27.4 % — ABNORMAL LOW (ref 39.0–52.0)
HEMATOCRIT: 29.8 % — AB (ref 39.0–52.0)
Hemoglobin: 10 g/dL — ABNORMAL LOW (ref 13.0–17.0)
Hemoglobin: 9.3 g/dL — ABNORMAL LOW (ref 13.0–17.0)
Hemoglobin: 9.1 g/dL — ABNORMAL LOW (ref 13.0–17.0)

## 2014-12-24 SURGERY — IMAGING PROCEDURE, GI TRACT, INTRALUMINAL, VIA CAPSULE

## 2014-12-24 MED ORDER — MORPHINE SULFATE 2 MG/ML IJ SOLN
2.0000 mg | Freq: Once | INTRAMUSCULAR | Status: AC
Start: 2014-12-24 — End: 2014-12-24
  Administered 2014-12-24: 2 mg via INTRAVENOUS
  Filled 2014-12-24: qty 1

## 2014-12-24 MED ORDER — METOPROLOL TARTRATE 50 MG PO TABS
100.0000 mg | ORAL_TABLET | Freq: Once | ORAL | Status: AC
  Administered 2014-12-24: 100 mg via ORAL
  Filled 2014-12-24: qty 2

## 2014-12-24 MED ORDER — SIMETHICONE 40 MG/0.6ML PO SUSP
ORAL | Status: AC
  Filled 2014-12-24: qty 0.6

## 2014-12-24 MED ORDER — HYDROCODONE-ACETAMINOPHEN 5-325 MG PO TABS
2.0000 | ORAL_TABLET | Freq: Once | ORAL | Status: AC
  Administered 2014-12-24: 2 via ORAL

## 2014-12-24 MED ORDER — FLEET ENEMA 7-19 GM/118ML RE ENEM: 1.0000 | ENEMA | Freq: Every day | RECTAL | Status: DC | PRN

## 2014-12-24 MED ORDER — METOPROLOL TARTRATE 50 MG PO TABS
100.0000 mg | ORAL_TABLET | Freq: Two times a day (BID) | ORAL | Status: DC
  Administered 2014-12-25: 100 mg via ORAL
  Filled 2014-12-24: qty 2

## 2014-12-24 NOTE — Progress Notes (Signed)
Patient is undergoing small bowel given capsule study. He has not experienced any more rectal bleeding. Hemoglobin is coming up and now it is 10 g. Fleet enema ordered when necessary. Will review studies later today.

## 2014-12-24 NOTE — Progress Notes (Signed)
Notified by central tele that patients HR was ranging from 135-140 bpm. Patients VS were WDL. Patient had no complaints at this time. MD was notified. MD ordered a one time dose of 100mg  of PO metoprolol. Patient received metoprolol @ 1811. Will continue to monitor patients HR at this time.

## 2014-12-24 NOTE — Progress Notes (Signed)
[facility] PROGRESS NOTE  Bryce Berry WUG:[id number] DOB: 10-08-1940 DOA: 12/21/2014 PCP: Glenda Chroman., MD  Summary:  This is a 74 year old gentleman who was brought to the hospital by his wife with rectal bleeding. He is essentially bed/wheelchair bound. He is on Xarelto for history of DVT/bilateral PE, aspirin for coronary artery disease, Pletal for peripheral vascular disease, he also takes meloxicam for arthritis. Patient was seen by gastroenterology and underwent colonoscopy that did not show any clear source of bleeding. There was noted to be a large amount of blood in his colon. He subsequently underwent bleeding scan that did not show any signs of active bleeding. He was transfused a total of 4 units PRBCs and his hemoglobin is stable now. He is now undergoing a capsule study to further evaluate for any small bowel source of bleeding. If his hemoglobin remains stable, anticipate discharge home with wife in the next 24 hours. GI is recommended to restart his anticoagulation one week. They also recommended to discontinue aspirin and Pletal for now. Will discontinue meloxicam indefinitely.  Assessment/Plan: 1. GI bleeding. CT scan of abdomen done on admission indicated diverticulitis. Patient underwent colonoscopy and no clear source of bleeding was identified. He subsequently had a bleeding scan done which did not reveal any active bleeding. Patient is undergoing capsule endoscopy today. Continue supportive treatments. Anticoagulation currently on hold, can be resumed in one week. NSAIDs on hold and would discontinue indefinitely in the setting of anticoagulation. 2. Acute diverticulitis with associated hemorrhage. Patient is on ciprofloxacin and Flagyl. Stool for C diff returned positive. Discussed with Dr. Laural Golden, he is not convinced that this was a true diverticulitis. Recommended discontinuing ciprofloxacin. Continue Flagyl. 3. C diff positive stools. Continue on flagyl and florastor.  Per recommendations from GI, would continue Flagyl for a total of 10 days. 4. Acute blood loss anemia. Last Hemoglobin was noted to be 11 from 10/2012. On admission, hemoglobin noted to be low at 8.9, which fell to a low of 7.6 on 5/8. He has been transfused a total of 4 units prbc during his hospitalization. Hemoglobin appears to be improving now. Continue to follow and transfuse as necessary 5. Atrial fib/flutter.Continue rate control medications with lopressor. Blood pressure appears to be stable. Anticoagulation on hold. 6. Coronary artery disease. Hold aspirin due to GI bleeding. On beta blocker. 7. GERD. Continue proton pump inhibitors. 8. History of PE/DVT in 2014. Patient's wife reports that he has a history of DVT as well as bilateral pulmonary emboli. Since the patient is essentially bedbound/wheelchair bound, he would certainly be at high risk for developing DVTs. Discussed with Dr. Laural Golden and he recommends restarting anticoagulation in 1 week, although he recommends to discontinue aspirin, mobic, pletal for now 9. Gout. Stable. Continue allopurinol. 10. BPH. Stable. Continue finasteride and Flomax.  Code Status: full code Family Communication: discussed with patient and wife at the bedside Disposition Plan: pending hospital course, likely return home on discharge   Consultants: gastroenterology  Procedures: Colonoscopy 5/8: Prep suboptimal with fresh blood as well as clots throughout the colon. Fresh blood noted in terminal ileum.  Scattered diverticula throughout the colon but none with bleeding.  Antibiotics:  cipro 5/7>>5/10  Flagyl 5/7>>  HPI/Subjective: No further blood in bowel movements. No shortness of breath or chest pain  Objective: Filed Vitals:   12/24/14 0422  BP: 159/85  Pulse: 102  Temp: 99.3 F (37.4 C)  Resp: 20    Intake/Output Summary (Last 24 hours) at 12/24/14 1324 Last data filed  at 12/24/14 1201  Gross per 24 hour  Intake    233 ml   Output    750 ml  Net   -517 ml   Filed Weights   12/21/14 0809 12/23/14 0500 12/24/14 0831  Weight: 136.079 kg (300 lb) 136.9 kg (301 lb 13 oz) 136.533 kg (301 lb)    Exam:   General:  NAD, laying in bed, appears comfortable, alert  Cardiovascular: S1, S2 irregular  Respiratory: CTA B, normal resp effort  Abdomen: soft, nt, nd, bs+  Musculoskeletal: 2+ pitting edema.  Data Reviewed: Basic Metabolic Panel:  Recent Labs Lab 12/21/14 0830 12/22/14 0739 12/23/14 0557 12/24/14 0339  NA 138 138 138 139  K 4.3 4.4 3.8 3.7  CL 107 105 106 107  CO2 27 28 28 26   GLUCOSE 107* 99 107* 107*  BUN 24* 21* 18 14  CREATININE 1.11 0.92 0.88 0.84  CALCIUM 8.9 8.8* 8.5* 8.6*   Liver Function Tests:  Recent Labs Lab 12/21/14 0830  AST 20  ALT 15*  ALKPHOS 43  BILITOT 0.4  PROT 5.9*  ALBUMIN 2.6*    Recent Labs Lab 12/21/14 0830  LIPASE 14*   No results for input(s): AMMONIA in the last 168 hours. CBC:  Recent Labs Lab 12/21/14 0830  12/22/14 0739  12/23/14 0557 12/23/14 0904 12/23/14 1530 12/23/14 2059 12/24/14 0339 12/24/14 1145  WBC 15.9*  --  13.3*  --  11.5*  --   --   --  12.7*  --   NEUTROABS 13.9*  --   --   --   --   --   --   --   --   --   HGB 8.9*  < > 9.1*  < > 9.6*  9.7* 9.7* 9.5* 9.4* 9.3* 10.0*  HCT 27.6*  < > 27.7*  < > 28.6*  28.8* 29.3* 28.6* 28.0* 27.7* 29.8*  MCV 87.9  --  87.1  --  85.9  --   --   --  87.7  --   PLT 258  --  233  --  182  --   --   --  181  --   < > = values in this interval not displayed. Cardiac Enzymes:  Recent Labs Lab 12/21/14 0830  TROPONINI <0.03   BNP (last 3 results) No results for input(s): BNP in the last 8760 hours.  ProBNP (last 3 results) No results for input(s): PROBNP in the last 8760 hours.  CBG: No results for input(s): GLUCAP in the last 168 hours.  Recent Results (from the past 240 hour(s))  Urine culture     Status: None   Collection Time: 12/21/14  8:48 AM  Result Value Ref  Range Status   Specimen Description URINE, CLEAN CATCH  Final   Special Requests NONE  Final   Colony Count   Final    >=100,000 COLONIES/ML Performed at [facility]    Culture   Final    Multiple bacterial morphotypes present, none predominant. Suggest appropriate recollection if clinically indicated. Performed at [facility]    Report Status 12/22/2014 FINAL  Final  MRSA PCR Screening     Status: Abnormal   Collection Time: 12/21/14  2:25 PM  Result Value Ref Range Status   MRSA by PCR POSITIVE (A) NEGATIVE Final    Comment:        The GeneXpert MRSA Assay (FDA approved for NASAL specimens only), is one component of a comprehensive MRSA  colonization surveillance program. It is not intended to diagnose MRSA infection nor to guide or monitor treatment for MRSA infections. RESULT CALLED TO, READ BACK BY AND VERIFIED WITH: HAMITT,S AT 2105 ON 12/21/2014 BY MOSLEY,J   Clostridium Difficile by PCR     Status: Abnormal   Collection Time: 12/22/14  8:15 AM  Result Value Ref Range Status   C difficile by pcr POSITIVE (A) NEGATIVE Final    Comment: CRITICAL RESULT CALLED TO, READ BACK BY AND VERIFIED WITH: M.GREY AT 1242 ON 12/22/14 BY S.VANHOORNE      Studies: Nm Gi Blood Loss  12/22/2014   CLINICAL DATA:  74 year old male with a history of diverticular disease and GI bleeding.  EXAM: NUCLEAR MEDICINE GASTROINTESTINAL BLEEDING SCAN  TECHNIQUE: Sequential abdominal images were obtained following intravenous administration of Tc-82m labeled red blood cells.  RADIOPHARMACEUTICALS:  Twenty-six mCi Tc-44m in-vitro labeled red cells.  COMPARISON:  CT 12/21/2014  FINDINGS: Planar imaging of the abdomen and pelvis performed after administered a shin of radial labeled red blood cells.  120 min of imaging was performed after administration of the radiotracer, with normal blood pool activity identified. Urinary bladder identified. Liver and heart identified.  No accumulation of  radiotracer outside of the normal blood pool or solid organs to suggest bleeding.  IMPRESSION: Negative radio labeled red blood cell study for GI bleeding.  Signed,  Dulcy Fanny. Earleen Newport, DO  [facility]  [facility]   Electronically Signed   By: Corrie Mckusick D.O.   On: 12/22/2014 18:38    Scheduled Meds: . sodium chloride   Intravenous Once  . allopurinol  100 mg Oral BID  . Chlorhexidine Gluconate Cloth  6 each Topical Q0600  . ciprofloxacin  400 mg Intravenous Q12H  . fentaNYL  100 mcg Transdermal Q3 days  . finasteride  5 mg Oral Daily  . furosemide  40 mg Oral Daily  . metoprolol tartrate  50 mg Oral BID  . metroNIDAZOLE  500 mg Oral 3 times per day  . multivitamin with minerals  1 tablet Oral Daily  . mupirocin ointment  1 application Nasal BID  . pantoprazole (PROTONIX) IV  40 mg Intravenous BID  . rosuvastatin  40 mg Oral QPM  . simethicone      . sodium chloride  500 mL Intravenous Once  . sodium chloride  10-40 mL Intracatheter Q12H  . sodium chloride  3 mL Intravenous Q12H  . tamsulosin  0.4 mg Oral QPC supper   Continuous Infusions: . sodium chloride 20 mL/hr at 12/23/14 0600  . sodium chloride 20 mL/hr at 12/23/14 1725    Principal Problem:   Melena Active Problems:   Hyperlipidemia   Essential hypertension   Atrial flutter   Peripheral vascular disease   COPD (chronic obstructive pulmonary disease)   CAD (coronary artery disease)   Gout   Gastroparesis   HX: anticoagulation   Diverticulitis   Diverticulitis of large intestine without perforation or abscess with bleeding   Clostridium difficile colitis    Time spent: 77mins    Aleaha Fickling  [facility] Pager [phone]. If 7PM-7AM, please contact night-coverage at [email or web address], password [facility] 12/24/2014, 1:24 PM  LOS: 3 days

## 2014-12-25 ENCOUNTER — Encounter (HOSPITAL_COMMUNITY): Payer: Self-pay | Admitting: Internal Medicine

## 2014-12-25 DIAGNOSIS — K922 Gastrointestinal hemorrhage, unspecified: Secondary | ICD-10-CM

## 2014-12-25 DIAGNOSIS — K633 Ulcer of intestine: Secondary | ICD-10-CM

## 2014-12-25 LAB — BASIC METABOLIC PANEL
Anion gap: 4 — ABNORMAL LOW (ref 5–15)
BUN: 14 mg/dL (ref 6–20)
CALCIUM: 8.4 mg/dL — AB (ref 8.9–10.3)
CO2: 25 mmol/L (ref 22–32)
Chloride: 107 mmol/L (ref 101–111)
Creatinine, Ser: 0.83 mg/dL (ref 0.61–1.24)
GFR calc Af Amer: >60 mL/min (ref >60)
Glucose, Bld: 127 mg/dL — ABNORMAL HIGH (ref 70–99)
Potassium: 3.7 mmol/L (ref 3.5–5.1)
SODIUM: 136 mmol/L (ref 135–145)

## 2014-12-25 LAB — CBC
HCT: 28.9 % — ABNORMAL LOW (ref 39.0–52.0)
HEMOGLOBIN: 9.5 g/dL — AB (ref 13.0–17.0)
MCH: 29.1 pg (ref 26.0–34.0)
MCHC: 32.9 g/dL (ref 30.0–36.0)
MCV: 88.4 fL (ref 78.0–100.0)
PLATELETS: 196 10*3/uL (ref 150–400)
RBC: 3.27 MIL/uL — ABNORMAL LOW (ref 4.22–5.81)
RDW: 18 % — ABNORMAL HIGH (ref 11.5–15.5)
WBC: 12.8 10*3/uL — ABNORMAL HIGH (ref 4.0–10.5)

## 2014-12-25 LAB — HEMOGLOBIN AND HEMATOCRIT, BLOOD
HCT: 29.3 % — ABNORMAL LOW (ref 39.0–52.0)
HEMOGLOBIN: 9.7 g/dL — AB (ref 13.0–17.0)

## 2014-12-25 MED ORDER — DILTIAZEM HCL ER COATED BEADS 120 MG PO CP24: 120.0000 mg | ORAL_CAPSULE | Freq: Every day | ORAL | Status: DC

## 2014-12-25 MED ORDER — METRONIDAZOLE 500 MG PO TABS: 500.0000 mg | ORAL_TABLET | Freq: Three times a day (TID) | ORAL | Status: DC

## 2014-12-25 NOTE — Care Management Note (Signed)
Case Management Note  Patient Details  Name: Bryce Berry MRN: [id number] Date of Birth: [birth year]  Subjective/Objective:                    Action/Plan:   Expected Discharge Date:  12/24/14               Expected Discharge Plan:  [place]  In-House Referral:  NA  Discharge planning Services  CM Consult  Post Acute Care Choice:  Resumption of Svcs/PTA Provider Choice offered to:  NA  DME Arranged:    DME Agency:     HH Arranged:    [place] Agency:     Status of Service:  Completed, signed off  Medicare Important Message Given:  Yes Date Medicare IM Given:  12/25/14 Medicare IM give by:  Christinia Gully, RN BSN CM Date Additional Medicare IM Given:    Additional Medicare Important Message give by:     If discussed at [place] of Stay Meetings, dates discussed:    Additional Comments: Anticipate discharge later today once results of H&H are ready. Pt will need EMS transportation home. Bedside RN to arrange once discharge completed. Pt and pts nurse aware of discharge arrangements. Christinia Gully [place], RN 12/25/2014, 2:49 PM

## 2014-12-25 NOTE — Plan of Care (Signed)
Problem: Phase I Progression Outcomes Goal: OOB as tolerated unless otherwise ordered Outcome: Adequate for Discharge Bed/WC bound as baseline

## 2014-12-25 NOTE — Progress Notes (Signed)
Patient discharging home.  IV and CVC removed - WNL.  Occlusive dressing to CVC site.  Reviewed meds (new and held) and follow up appointments with patient and wife.  Instructed on s/s of when to call MD.  Bryce Berry understanding.  Awaiting EMS arrival for transport home.  Stable at this time to DC home.

## 2014-12-25 NOTE — Op Note (Signed)
Small Bowel [place] Capsule Study Procedure date:   12/24/2014  Referring Provider:   Kathie Dike, MD PCP:  Dr. Glenda Chroman., MD  Indication for procedure:   Patient is 74 year old  African-American male who presents with acute GI bleed. He has received 2 units of PRBCs. He underwent colonoscopy on 12/22/2014 revealing pancolonic diverticulosis and blood was also noted terminal ileum. He  Is therefore undergoing small bowel given capsule study.   Findings:   Multiple small ulcers noted in distal small bowel or ileum starting at 5 hours 23 minutes and  58 seconds. Fresh blood noted initially on image at 6 are 52 minutes and 35 seconds with pooling at 6 hours 53 minutes and 12 seconds. Dark blood noted distal to area with fresh blood until lesion of study Cecum not reached in study duration which is 9 hours and 22 minutes.  First Gastric image:   2  min and 51 sec First Duodenal image:  15 min and 27 sec First Ileo-Cecal Valve image:  Not reached First Cecal image:  Not reached Gastric Passage time:  13 minutes Small Bowel Passage time:   Cannot be calculated at cecum not reached.  Summary & Recommendations: Multiple small bowel ulcers in distal small bowel along with fresh blood. Bleeding site not identified but felt to be from these ulcers. These ulcers appear to be NSAID induced, Capsule did not reach cecum in study period of 9 hours and 22 minutes. Repeat H/H later today. If stable should be able to go home today or tomorrow. No NSAIDs. Can resume Xarelto in one week.

## 2014-12-25 NOTE — Discharge Summary (Signed)
Physician Discharge Summary  GRAEME MENEES CBJ:[id number] DOB: 02-21-1941 DOA: 12/21/2014  PCP: Glenda Chroman., MD  Admit date: 12/21/2014 Discharge date: 12/25/2014  Time spent: 35 minutes  Recommendations for Outpatient Follow-up:  1. GI recommending restarting Xarelto in one week.  2. On day of discharge his Hg continued to improve with Hg trending up to 9.7 from 9.5 earlier in the day.  3. Follow up on CBC in 1 week.   Discharge Diagnoses:  Principal Problem:   Melena Active Problems:   Hyperlipidemia   Essential hypertension   Atrial flutter   Peripheral vascular disease   COPD (chronic obstructive pulmonary disease)   CAD (coronary artery disease)   Gout   Gastroparesis   HX: anticoagulation   Diverticulitis   Diverticulitis of large intestine without perforation or abscess with bleeding   Clostridium difficile colitis   Acute blood loss anemia   Discharge Condition: Stable/Improved  Diet recommendation: Heart Healthy  Filed Weights   12/21/14 0809 12/23/14 0500 12/24/14 0831  Weight: 136.079 kg (300 lb) 136.9 kg (301 lb 13 oz) 136.533 kg (301 lb)    History of present illness:  The patient is a 74 y.o. year-old male with history of chronic anticoagulation with xarelto secondary to chronic a-fib/a-flutter and bilateral PE, CAD s/p 4 stents, diverticulosis s/p partial sigmoidectomy 20 years ago, internal hemorrhoids, COPD, gout, recurrent UTI who presents with diarrhea and rectal bleeding. In 2014 he was hospitalized for rectal bleeding and was found to have bilateral PEs, right lower extremity DVT, A. fib with RVR. He had upper and lower endoscopy and was found to have diverticulosis, internal hemorrhoids, and multiple gastric erosions and a small gastric ulcer. At that time he was started on Xarelto. He has been followed by Dr. Ron Parker for his a-flutter/flutter and Dr. Laural Golden for his diverticulosis.   Over the last few years, he has become progressively more disabled  and is currently essentially bedbound and gets around with a motorized wheelchair. His wife cares for him.   Over the last few months, he has had intermittent diarrhea. He was seen by GI 2 weeks ago, at which time he had a negative GI pathogen panel. He was started on Flagyl and Imodium which helped. He completed his course a couple of days prior to admission. Since stopping his medication he has had a return of his watery stools, 5 watery bowel movements per day. His wife contacted his primary care doctor who recommended that he start Pepto-Bismol. This caused his stools to turn dark black. The night prior to admission, he developed severe left upper and left lower quadrant cramping which would come and go lasting for a few minutes at a time. Overnight, his wife states that he was continuously having moderate volume watery black stools with some pink tinge and streaks of blood. He denies fevers, chills. He has had some nausea without vomiting. He has been on antibiotics for urinary tract infection in the last 3 months. He denies respiratory symptoms. He has not exerted himself because he is bedbound and therefore is uncertain about dyspnea on exertion. He denies lightheadedness, dizziness.  Hospital Course:  This is a 74 year old gentleman who was brought to the hospital by his wife with rectal bleeding. He is essentially bed/wheelchair bound. He is on Xarelto for history of DVT/bilateral PE, aspirin for coronary artery disease, Pletal for peripheral vascular disease, he also takes meloxicam for arthritis. Patient was seen by gastroenterology and underwent colonoscopy that did not show any clear source  of bleeding. There was noted to be a large amount of blood in his colon. He subsequently underwent bleeding scan that did not show any signs of active bleeding. He was transfused a total of 4 units PRBCs and his hemoglobin is stable now. He is now undergoing a capsule study to further evaluate for any small  bowel source of bleeding. If his hemoglobin remains stable, anticipate discharge home with wife in the next 24 hours. GI is recommended to restart his anticoagulation one week. They also recommended to discontinue aspirin and Pletal for now. Will discontinue meloxicam indefinitely. Patient undergoing Capsule Endoscopy that showed multiple small ulcers in the distal small bowel. Although bleeding site not identified, GI bleed felt to be secondary to these ulcers. Ulcers may have been caused by chronic NSAID use. On discharge he was instructed to discontinue aspirin, meloxicam and resuming Xarelto in 1 week. Other issues addressed during this hospitalization include C diff colitis for which he was treated with flagyl. He was discharged on a 10 day course of flagyl.    Procedures: Colonoscopy 5/8: Prep suboptimal with fresh blood as well as clots throughout the colon. Fresh blood noted in terminal ileum. Scattered diverticula throughout the colon but none with bleeding.  Capsule Endoscopy performed on 5/10 Multiple small bowel ulcers in distal small bowel along with fresh blood.  Consultations:  GI   Discharge Exam: Filed Vitals:   12/25/14 0700  BP: 161/93  Pulse: 94  Temp: 98.9 F (37.2 C)  Resp: 21     General: NAD, laying in bed, appears comfortable, alert  Cardiovascular: S1, S2 irregular  Respiratory: CTA B, normal resp effort  Abdomen: soft, nt, nd, bs+  Musculoskeletal: 2+ pitting edema.  Discharge Instructions   Discharge Instructions    Call MD for:  difficulty breathing, headache or visual disturbances    Complete by:  As directed      Call MD for:  extreme fatigue    Complete by:  As directed      Call MD for:  hives    Complete by:  As directed      Call MD for:  persistant dizziness or light-headedness    Complete by:  As directed      Call MD for:  persistant nausea and vomiting    Complete by:  As directed      Call MD for:  redness, tenderness, or signs  of infection (pain, swelling, redness, odor or green/yellow discharge around incision site)    Complete by:  As directed      Call MD for:  severe uncontrolled pain    Complete by:  As directed      Call MD for:  temperature >100.4    Complete by:  As directed      Diet - low sodium heart healthy    Complete by:  As directed      Discharge instructions    Complete by:  As directed   You may resume taking Xarelto in 1 week.     Increase activity slowly    Complete by:  As directed           Current Discharge Medication List    CONTINUE these medications which have CHANGED   Details  diltiazem (CARDIZEM CD) 120 MG 24 hr capsule Take 1 capsule (120 mg total) by mouth daily. As directed based off blood pressure readings Qty: 60 capsule, Refills: 3    metroNIDAZOLE (FLAGYL) 500 MG tablet Take 1 tablet (  500 mg total) by mouth 3 (three) times daily. Qty: 30 tablet, Refills: 0      CONTINUE these medications which have NOT CHANGED   Details  allopurinol (ZYLOPRIM) 100 MG tablet Take 100 mg by mouth 2 (two) times daily.     alum & mag hydroxide-simeth (MYLANTA) 696-295-28 MG/5ML suspension Take 15 mLs by mouth as needed for indigestion.     ascorbic acid (VITAMIN C) 500 MG tablet Take 500 mg by mouth daily.     calcium-vitamin D (OSCAL WITH D) 500-200 MG-UNIT per tablet Take 1 tablet by mouth daily.     fenofibrate (TRICOR) 145 MG tablet Take 1 tablet (145 mg total) by mouth daily. Qty: 30 tablet, Refills: 6    fentaNYL (DURAGESIC - DOSED MCG/HR) 100 MCG/HR Place 1 patch onto the skin every 3 (three) days. Refills: 0    Ferrous Sulfate (RA IRON) 27 MG TABS Take 1 tablet by mouth daily.      finasteride (PROSCAR) 5 MG tablet Take 5 mg by mouth daily.      furosemide (LASIX) 20 MG tablet Take 2 tablets (40 mg total) by mouth daily. Qty: 270 tablet, Refills: 3    HYDROcodone-acetaminophen (NORCO/VICODIN) 5-325 MG per tablet Take 1-2 tablets by mouth every 6 (six) hours as needed  for moderate pain or severe pain.  Refills: 0    metoCLOPramide (REGLAN) 10 MG tablet Take 1 tablet (10 mg total) by mouth daily. Qty: 30 tablet, Refills: 5   Associated Diagnoses: Gastroparesis    metoprolol (LOPRESSOR) 100 MG tablet Take 1 tablet (100 mg total) by mouth 2 (two) times daily. Qty: 180 tablet, Refills: 3    Multiple Vitamin (MULTIVITAMIN) tablet Take 1 tablet by mouth daily.      nitroGLYCERIN (NITROSTAT) 0.4 MG SL tablet Place 1 tablet (0.4 mg total) under the tongue every 5 (five) minutes as needed. Qty: 25 tablet, Refills: 3    Omega-3 Fatty Acids (FISH OIL) 1000 MG CAPS Take 2 capsules by mouth daily.     pantoprazole (PROTONIX) 40 MG tablet Take 40 mg by mouth daily.      psyllium (METAMUCIL SMOOTH TEXTURE) 28 % packet Take 1 packet by mouth at bedtime.    rosuvastatin (CRESTOR) 40 MG tablet Take 1 tablet (40 mg total) by mouth every evening. Qty: 90 tablet, Refills: 3    tamsulosin (FLOMAX) 0.4 MG CAPS capsule Take 0.4 mg by mouth daily after supper.      STOP taking these medications     aspirin EC 81 MG tablet      cilostazol (PLETAL) 100 MG tablet      colchicine 0.6 MG tablet      diphenoxylate-atropine (LOMOTIL) 2.5-0.025 MG per tablet      meloxicam (MOBIC) 7.5 MG tablet      potassium chloride SA (K-DUR,KLOR-CON) 20 MEQ tablet      Rivaroxaban (XARELTO) 20 MG TABS        Allergies  Allergen Reactions  . Iodine     REACTION: rash  . Penicillins     Rapid heart rate; flushed  . Sulfonamide Derivatives     Rapid heart rate; flushed   Follow-up Information    Follow up with REHMAN,NAJEEB U, MD In 1 week.   Specialty:  Gastroenterology   Contact information:   [address], [address] [place] [address] [phone]       Follow up with VYAS,DHRUV B., MD In 2 weeks.   Specialty:  Internal Medicine  Contact information:   [place] [address] [phone]        The results of significant diagnostics from this  hospitalization (including imaging, microbiology, ancillary and laboratory) are listed below for reference.    Significant Diagnostic Studies: Ct Abdomen Pelvis Wo Contrast  12/21/2014   CLINICAL DATA:  74 year old male with a history of rectal bleeding  EXAM: CT ABDOMEN AND PELVIS WITHOUT CONTRAST  TECHNIQUE: Multidetector CT imaging of the abdomen and pelvis was performed following the standard protocol without IV contrast.  COMPARISON:  CT 01/05/2011  FINDINGS: Lower chest:  Unremarkable appearance of the superficial soft tissues of the chest wall.  Heart size within normal limits. Calcifications in the distribution of the right coronary artery, circumflex coronary artery, and the LAD. Calcifications of the aortic arch. No pericardial fluid/ thickening.  Large hiatal hernia again demonstrated.  Respiratory motion somewhat limits evaluation of the lungs for small nodules. Trace right pleural effusion. No confluent airspace disease.  Abdomen/ pelvis:  Unremarkable appearance of liver and spleen. Unremarkable appearance of pancreas. Unremarkable appearance the bilateral adrenal glands. Unremarkable gallbladder.  No evidence of hydronephrosis or nephrolithiasis. Unremarkable appearance of the course of the bilateral ureters. Distal ureters not evaluated secondary to streak artifact.  No intraperitoneal air or significant free fluid.  Urinary bladder partially distended with urine. Lobulated contour along the right may represent a diverticulum.  Surgical changes of prior partial colectomy in the region of the sigmoid. There are colonic diverticula throughout the colon. There is subtle stranding within the mesenteric adjacent to the descending colon (image 52 of series 2). Fluid filled colon, including the rectum.  Enteric contrast extends through the length of the GI system. No abnormally distended small bowel.  No mesenteric adenopathy.  No retroperitoneal adenopathy.  Bilateral fact containing inguinal hernia.   Surgical changes of bilateral total hip arthroplasty contributing to streak artifact at the pelvis.  No displaced fracture identified. Multilevel degenerative changes of the visualized spine.  Atrophy and fatty infiltration of the right greater than left psoas musculature.  Mild infiltration of the soft tissues of the body wall, likely edema.  Atherosclerotic calcification of the abdominal aorta. No aneurysm. Calcification at the origins of the mesenteric vessels.  IMPRESSION: Subtle stranding adjacent to the descending colon/ sigmoid colon with associated diverticula, favored to represent early diverticulitis given the patient's history. Once the patient has been treated for this acute process, correlation with colonoscopy is suggested.  Surgical changes of prior partial colectomy. No evidence of obstruction.  Fluid filled colon, can be seen with enteritis/colitis. Recommend correlation with patient presentation.  Large hiatal hernia.  Atherosclerosis with evidence of coronary artery disease.  Additional findings as above.  Signed,  Dulcy Fanny. Earleen Newport, DO  [facility]  [facility]   Electronically Signed   By: Corrie Mckusick D.O.   On: 12/21/2014 12:21   Nm Gi Blood Loss  12/22/2014   CLINICAL DATA:  74 year old male with a history of diverticular disease and GI bleeding.  EXAM: NUCLEAR MEDICINE GASTROINTESTINAL BLEEDING SCAN  TECHNIQUE: Sequential abdominal images were obtained following intravenous administration of Tc-3m labeled red blood cells.  RADIOPHARMACEUTICALS:  Twenty-six mCi Tc-30m in-vitro labeled red cells.  COMPARISON:  CT 12/21/2014  FINDINGS: Planar imaging of the abdomen and pelvis performed after administered a shin of radial labeled red blood cells.  120 min of imaging was performed after administration of the radiotracer, with normal blood pool activity identified. Urinary bladder identified. Liver and  heart identified.  No accumulation of  radiotracer outside of the normal blood pool or solid organs to suggest bleeding.  IMPRESSION: Negative radio labeled red blood cell study for GI bleeding.  Signed,  Dulcy Fanny. Earleen Newport, DO  [facility]  [facility]   Electronically Signed   By: Corrie Mckusick D.O.   On: 12/22/2014 18:38    Microbiology: Recent Results (from the past 240 hour(s))  Urine culture     Status: None   Collection Time: 12/21/14  8:48 AM  Result Value Ref Range Status   Specimen Description URINE, CLEAN CATCH  Final   Special Requests NONE  Final   Colony Count   Final    >=100,000 COLONIES/ML Performed at [facility]    Culture   Final    Multiple bacterial morphotypes present, none predominant. Suggest appropriate recollection if clinically indicated. Performed at [facility]    Report Status 12/22/2014 FINAL  Final  MRSA PCR Screening     Status: Abnormal   Collection Time: 12/21/14  2:25 PM  Result Value Ref Range Status   MRSA by PCR POSITIVE (A) NEGATIVE Final    Comment:        The GeneXpert MRSA Assay (FDA approved for NASAL specimens only), is one component of a comprehensive MRSA colonization surveillance program. It is not intended to diagnose MRSA infection nor to guide or monitor treatment for MRSA infections. RESULT CALLED TO, READ BACK BY AND VERIFIED WITH: HAMITT,S AT 2105 ON 12/21/2014 BY MOSLEY,J   Clostridium Difficile by PCR     Status: Abnormal   Collection Time: 12/22/14  8:15 AM  Result Value Ref Range Status   C difficile by pcr POSITIVE (A) NEGATIVE Final    Comment: CRITICAL RESULT CALLED TO, READ BACK BY AND VERIFIED WITH: M.GREY AT 1242 ON 12/22/14 BY S.VANHOORNE      Labs: Basic Metabolic Panel:  Recent Labs Lab 12/21/14 0830 12/22/14 0739 12/23/14 0557 12/24/14 0339 12/25/14 0320  NA 138 138 138 139 136  K 4.3 4.4 3.8 3.7 3.7  CL 107 105 106 107 107  CO2 27 28 28 26 25   GLUCOSE 107* 99 107* 107*  127*  BUN 24* 21* 18 14 14   CREATININE 1.11 0.92 0.88 0.84 0.83  CALCIUM 8.9 8.8* 8.5* 8.6* 8.4*   Liver Function Tests:  Recent Labs Lab 12/21/14 0830  AST 20  ALT 15*  ALKPHOS 43  BILITOT 0.4  PROT 5.9*  ALBUMIN 2.6*    Recent Labs Lab 12/21/14 0830  LIPASE 14*   No results for input(s): AMMONIA in the last 168 hours. CBC:  Recent Labs Lab 12/21/14 0830  12/22/14 0739  12/23/14 0557  12/24/14 0339 12/24/14 1145 12/24/14 1525 12/24/14 2113 12/25/14 0320 12/25/14 1404  WBC 15.9*  --  13.3*  --  11.5*  --  12.7*  --   --   --  12.8*  --   NEUTROABS 13.9*  --   --   --   --   --   --   --   --   --   --   --   HGB 8.9*  < > 9.1*  < > 9.6*  9.7*  < > 9.3* 10.0* 9.3* 9.1* 9.5* 9.7*  HCT 27.6*  < > 27.7*  < > 28.6*  28.8*  < > 27.7* 29.8* 28.1* 27.4* 28.9* 29.3*  MCV 87.9  --  87.1  --  85.9  --  87.7  --   --   --  88.4  --   PLT 258  --  233  --  182  --  181  --   --   --  196  --   < > = values in this interval not displayed. Cardiac Enzymes:  Recent Labs Lab 12/21/14 0830  TROPONINI <0.03   BNP: BNP (last 3 results) No results for input(s): BNP in the last 8760 hours.  ProBNP (last 3 results) No results for input(s): PROBNP in the last 8760 hours.  CBG: No results for input(s): GLUCAP in the last 168 hours.     SignedKelvin Cellar  [facility] 12/25/2014, 3:44 PM

## 2014-12-25 NOTE — Progress Notes (Signed)
UR chart review completed.  

## 2014-12-27 ENCOUNTER — Telehealth: Payer: Self-pay | Admitting: *Deleted

## 2014-12-27 NOTE — Telephone Encounter (Signed)
-----   Message from Bryce Bjornstad, MD sent at 12/27/2014  4:00 PM EDT ----- Please call this patient and make sure he knows that I am aware that his aspirin and Pletal were stopped. Please let him know that I am in agreement with this. I agree that these medicine should not be restarted. We do not need to make him an appointment to tell him this ----- Message -----    From: Kathie Dike, MD    Sent: 12/24/2014   5:34 PM      To: Bryce Bjornstad, MD  [name],  Bryce Berry was admitted to [facility] with significant GI bleeding. It was noted he is on xarelto for history of DVT/PE, aspirin for CAD and pletal for PVD. His GI bleeding has resolved now, without a clear source identified yet. Since he is bed/wheelchair bound, his risk of recurrent DVT would likely be high. GI has recommended to restart xarelto in one week, but discontinue aspirin and pletal for now, since all these medications together may precipitate too much bleeding. I just wanted to keep you in the loop, and let you know that we were discontinuing aspirin. If your office could please schedule an appointment with him for follow up to discuss this further, in case you want him to resume the aspirin.  Thanks again  [name].

## 2014-12-27 NOTE — Telephone Encounter (Signed)
Patient's wife informed

## 2015-01-02 ENCOUNTER — Encounter (INDEPENDENT_AMBULATORY_CARE_PROVIDER_SITE_OTHER): Payer: Self-pay | Admitting: Internal Medicine

## 2015-01-02 ENCOUNTER — Ambulatory Visit (INDEPENDENT_AMBULATORY_CARE_PROVIDER_SITE_OTHER): Payer: Medicare Other | Admitting: Internal Medicine

## 2015-01-02 ENCOUNTER — Encounter (INDEPENDENT_AMBULATORY_CARE_PROVIDER_SITE_OTHER): Payer: Self-pay | Admitting: *Deleted

## 2015-01-02 VITALS — BP 132/94 | HR 84 | Temp 97.9°F | Ht 74.0 in | Wt 295.0 lb

## 2015-01-02 DIAGNOSIS — I2510 Atherosclerotic heart disease of native coronary artery without angina pectoris: Secondary | ICD-10-CM | POA: Diagnosis not present

## 2015-01-02 DIAGNOSIS — K922 Gastrointestinal hemorrhage, unspecified: Secondary | ICD-10-CM | POA: Diagnosis not present

## 2015-01-02 LAB — CBC
HCT: 30.7 % — ABNORMAL LOW (ref 39.0–52.0)
Hemoglobin: 9.8 g/dL — ABNORMAL LOW (ref 13.0–17.0)
MCH: 27.8 pg (ref 26.0–34.0)
MCHC: 31.9 g/dL (ref 30.0–36.0)
MCV: 87 fL (ref 78.0–100.0)
MPV: 10.7 fL (ref 8.6–12.4)
PLATELETS: 207 10*3/uL (ref 150–400)
RBC: 3.53 MIL/uL — ABNORMAL LOW (ref 4.22–5.81)
RDW: 17.2 % — ABNORMAL HIGH (ref 11.5–15.5)
WBC: 13.5 10*3/uL — ABNORMAL HIGH (ref 4.0–10.5)

## 2015-01-02 NOTE — Progress Notes (Signed)
Subjective:    Patient ID: Bryce Berry, male    DOB: 1941-05-16, 74 y.o.   MRN: [id number]  HPI Patient is w/c bound. Examined from wheel chair. Weight is stated.  Here today for f/u after recent admission to [facility] for rectal bleeding and anemia.  He was seen in our office 2 weeks before admission and his GI pathogen was negative. He had been having diarrhea. He was empirically treated with Flagyl and his symptoms improved. His diarrhea relapsed and he began to pass bright red blood per rectum. Admission hemoglobin was 8.0. He received 2 units of PRBCs. Discharge hemoglobin was 9.7 He underwent a colonoscopy 12/22/2014 which revealed: Prep suboptimal with fresh blood as well as clots throughout the colon. Fresh blood noted in terminal ileum.Scattered diverticula throughout the colon but none with bleeding. He underwent a Given Capsule study 12/25/2014 which revealed Multiple small bowel ulcers in distal small bowel along with fresh blood. Bleeding site not identified but felt to be from these ulcers.These ulcers appear to be NSAID induced,Capsule did not reach cecum in study period of 9 hours and 22 minutes.Repeat H/H later today. If stable should be able to go home today or tomorrow.No NSAIDs.Can resume Xarelto in one week. He tells me he is doing pretty good.  He just started his Xarelto back today. The ASA has been stopped. He usually has a BM about every other day.  aheh as not seen any BRRB.  Appetite has been good. He cannot tell me if he has lost any weight.  CBC    Component Value Date/Time   WBC 12.8* 12/25/2014 0320   RBC 3.27* 12/25/2014 0320   HGB 9.7* 12/25/2014 1404   HCT 29.3* 12/25/2014 1404   PLT 196 12/25/2014 0320   MCV 88.4 12/25/2014 0320   MCH 29.1 12/25/2014 0320   MCHC 32.9 12/25/2014 0320   RDW 18.0* 12/25/2014 0320   LYMPHSABS 1.1 12/21/2014 0830   MONOABS 0.9 12/21/2014 0830   EOSABS 0.0 12/21/2014 0830   BASOSABS 0.0 12/21/2014 0830    CMP     Component Value  Date/Time   NA 136 12/25/2014 0320   K 3.7 12/25/2014 0320   CL 107 12/25/2014 0320   CO2 25 12/25/2014 0320   GLUCOSE 127* 12/25/2014 0320   BUN 14 12/25/2014 0320   CREATININE 0.83 12/25/2014 0320   CALCIUM 8.4* 12/25/2014 0320   PROT 5.9* 12/21/2014 0830   ALBUMIN 2.6* 12/21/2014 0830   AST 20 12/21/2014 0830   ALT 15* 12/21/2014 0830   ALKPHOS 43 12/21/2014 0830   BILITOT 0.4 12/21/2014 0830   GFRNONAA >60 12/25/2014 0320   GFRAA >60 12/25/2014 0320       Colonoscopy 2014 revealing pancolonic diverticulosis. Hx of gastroparesis. He takes Reglan and has no sided effects.  Hx  .   Review of Systems  Past Medical History  Diagnosis Date  . Atrial fibrillation     Postop hip surgery, January 2012, digoxin and diltiazem, reverted to sinus in the hospital  . Osteoarthritis   . COPD (chronic obstructive pulmonary disease)     Emphysema  . Depression   . Posttraumatic stress disorder   . GERD (gastroesophageal reflux disease)   . Irritable bowel   . BPH (benign prostatic hyperplasia)   . Urinary tract bacterial infections   . Osteoporosis   . Hypertension   . Dyslipidemia   . CAD (coronary artery disease)     Stents placed in [place] and [place] in  the past  /Myoview 2008, no ischemia, /   . Atrial flutter     History of the past with syncope and  ?? H. and consideration, no records  . 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 Hip pain   . Shoulder pain   . Gout   . Peripheral arterial disease     Dopplers Dr.Vyas office October, 2010, apparent occlusion anterior tibial arteries bilaterally., findings suggest hemodynamically significant stenosis of the proximal right provisional femoral artery and the mid left superficial artery.  Ankle-brachial indices indicate moderate stenosis within both lower extremities  . Chronic kidney disease     Creatinine 2.1, October 13, 2010--> 0.74 10/2012  . Hyperlipidemia     Mixed  . GI bleed   . Ejection fraction     EF  50-55%, echo, May, 2013, severe inferior hypokinesis  . Bilateral pulmonary embolism     a. 10/2012 - V:Q scan high prob for Bilat PE in setting of dyspnea/hemoptysis-->anticoagulation started.  . Right leg DVT     a. 10/2012 - subocclusive DVT R popliteal vein.  Marland Kitchen COPD (chronic obstructive pulmonary disease)     Past Surgical History  Procedure Laterality Date  . Knee arthroplasty      Right  . Total knee arthroplasty      Left  . Total hip arthroplasty      Right-complicated hx  . Colon resection    . [place] surgery      [place]  . Prostate surgery    . Colonoscopy N/A 03/13/2013    Procedure: COLONOSCOPY;  Surgeon: Rogene Houston, MD;  Location: [facility];  Service: Endoscopy;  Laterality: N/A;  730  . Cataract extraction    . Colonoscopy N/A 12/22/2014    Procedure: COLONOSCOPY;  Surgeon: Rogene Houston, MD;  Location: [facility];  Service: Endoscopy;  Laterality: N/A;  . Givens capsule study N/A 12/24/2014    Procedure: GIVENS CAPSULE STUDY;  Surgeon: Rogene Houston, MD;  Location: [facility];  Service: Endoscopy;  Laterality: N/A;  . Coronary angioplasty with stent placement      x 4    Allergies  Allergen Reactions  . Iodine     REACTION: rash  . Penicillins     Rapid heart rate; flushed  . Sulfonamide Derivatives     Rapid heart rate; flushed    Current Outpatient Prescriptions on File Prior to Visit  Medication Sig Dispense Refill  . allopurinol (ZYLOPRIM) 100 MG tablet Take 100 mg by mouth 2 (two) times daily.     Marland Kitchen alum & mag hydroxide-simeth (MYLANTA) 962-229-79 MG/5ML suspension Take 15 mLs by mouth as needed for indigestion.     Marland Kitchen ascorbic acid (VITAMIN C) 500 MG tablet Take 500 mg by mouth daily.     . calcium-vitamin D (OSCAL WITH D) 500-200 MG-UNIT per tablet Take 1 tablet by mouth daily.     Marland Kitchen diltiazem (CARDIZEM CD) 120 MG 24 hr capsule Take 1 capsule (120 mg total) by mouth daily. As directed based off blood pressure readings 60 capsule 3   . fenofibrate (TRICOR) 145 MG tablet Take 1 tablet (145 mg total) by mouth daily. 30 tablet 6  . fentaNYL (DURAGESIC - DOSED MCG/HR) 100 MCG/HR Place 1 patch onto the skin every 3 (three) days.  0  . Ferrous Sulfate (RA IRON) 27 MG TABS Take 1 tablet by mouth daily.      . finasteride (PROSCAR) 5 MG tablet  Take 5 mg by mouth daily.      . furosemide (LASIX) 20 MG tablet Take 2 tablets (40 mg total) by mouth daily. 270 tablet 3  . HYDROcodone-acetaminophen (NORCO/VICODIN) 5-325 MG per tablet Take 1-2 tablets by mouth every 6 (six) hours as needed for moderate pain or severe pain.   0  . metoCLOPramide (REGLAN) 10 MG tablet Take 1 tablet (10 mg total) by mouth daily. 30 tablet 5  . metoprolol (LOPRESSOR) 100 MG tablet Take 1 tablet (100 mg total) by mouth 2 (two) times daily. 180 tablet 3  . metroNIDAZOLE (FLAGYL) 500 MG tablet Take 1 tablet (500 mg total) by mouth 3 (three) times daily. 30 tablet 0  . Multiple Vitamin (MULTIVITAMIN) tablet Take 1 tablet by mouth daily.      . nitroGLYCERIN (NITROSTAT) 0.4 MG SL tablet Place 1 tablet (0.4 mg total) under the tongue every 5 (five) minutes as needed. (Patient taking differently: Place 0.4 mg under the tongue every 5 (five) minutes as needed for chest pain. ) 25 tablet 3  . Omega-3 Fatty Acids (FISH OIL) 1000 MG CAPS Take 2 capsules by mouth daily.     . pantoprazole (PROTONIX) 40 MG tablet Take 40 mg by mouth daily.      . psyllium (METAMUCIL SMOOTH TEXTURE) 28 % packet Take 1 packet by mouth at bedtime.    . rosuvastatin (CRESTOR) 40 MG tablet Take 1 tablet (40 mg total) by mouth every evening. 90 tablet 3  . tamsulosin (FLOMAX) 0.4 MG CAPS capsule Take 0.4 mg by mouth daily after supper.    . [DISCONTINUED] solifenacin (VESICARE) 10 MG tablet Take 10 mg by mouth daily.      No current facility-administered medications on file prior to visit.       Objective:   Physical ExamBlood pressure 132/94, pulse 84, temperature 97.9 F (36.6 C), height  6\' 2"  (1.88 m), weight 295 lb (133.811 kg).  Alert and oriented. Skin warm and dry. Oral mucosa is moist.   . Sclera anicteric, conjunctivae is pink. Thyroid not enlarged. No cervical lymphadenopathy. Lungs clear. Heart regular rate and rhythm.  Abdomen is soft and obese.  Bowel sounds are positive. No hepatomegaly. No abdominal masses felt. No tenderness.  Swelling to both extremities. Rt greater than left.        Assessment & Plan:  Hx of Lower GI bleed thought to be NSAID induced. He has not had any further bleeding. Resume Xarelto. No ASA unless Cardiology wants this. May resume Pletal. No Meloxican. May use Colchicine prn.   CBC today. OV 3 months with a CBC.

## 2015-01-02 NOTE — Patient Instructions (Signed)
OV in 3 months with a CBC

## 2015-01-03 ENCOUNTER — Emergency Department (HOSPITAL_COMMUNITY): Payer: Medicare Other

## 2015-01-03 ENCOUNTER — Inpatient Hospital Stay (HOSPITAL_COMMUNITY)
Admission: EM | Admit: 2015-01-03 | Discharge: 2015-01-05 | DRG: 176 | Disposition: A | Discharge status: Home / Self Care | Payer: Medicare Other | Attending: Internal Medicine | Admitting: Internal Medicine

## 2015-01-03 ENCOUNTER — Telehealth (INDEPENDENT_AMBULATORY_CARE_PROVIDER_SITE_OTHER): Payer: Self-pay | Admitting: *Deleted

## 2015-01-03 ENCOUNTER — Encounter (HOSPITAL_COMMUNITY): Payer: Self-pay | Admitting: Emergency Medicine

## 2015-01-03 DIAGNOSIS — I10 Essential (primary) hypertension: Secondary | ICD-10-CM | POA: Diagnosis present

## 2015-01-03 DIAGNOSIS — I82401 Acute embolism and thrombosis of unspecified deep veins of right lower extremity: Secondary | ICD-10-CM | POA: Diagnosis present

## 2015-01-03 DIAGNOSIS — Z96641 Presence of right artificial hip joint: Secondary | ICD-10-CM | POA: Diagnosis present

## 2015-01-03 DIAGNOSIS — K219 Gastro-esophageal reflux disease without esophagitis: Secondary | ICD-10-CM | POA: Diagnosis present

## 2015-01-03 DIAGNOSIS — Z7901 Long term (current) use of anticoagulants: Secondary | ICD-10-CM | POA: Diagnosis not present

## 2015-01-03 DIAGNOSIS — I2510 Atherosclerotic heart disease of native coronary artery without angina pectoris: Secondary | ICD-10-CM | POA: Diagnosis present

## 2015-01-03 DIAGNOSIS — Z8249 Family history of ischemic heart disease and other diseases of the circulatory system: Secondary | ICD-10-CM

## 2015-01-03 DIAGNOSIS — F4310 Post-traumatic stress disorder, unspecified: Secondary | ICD-10-CM | POA: Diagnosis present

## 2015-01-03 DIAGNOSIS — Z833 Family history of diabetes mellitus: Secondary | ICD-10-CM | POA: Diagnosis not present

## 2015-01-03 DIAGNOSIS — Z86711 Personal history of pulmonary embolism: Secondary | ICD-10-CM | POA: Diagnosis not present

## 2015-01-03 DIAGNOSIS — M1990 Unspecified osteoarthritis, unspecified site: Secondary | ICD-10-CM | POA: Diagnosis present

## 2015-01-03 DIAGNOSIS — Z86718 Personal history of other venous thrombosis and embolism: Secondary | ICD-10-CM | POA: Diagnosis not present

## 2015-01-03 DIAGNOSIS — Z6838 Body mass index (BMI) 38.0-38.9, adult: Secondary | ICD-10-CM | POA: Diagnosis not present

## 2015-01-03 DIAGNOSIS — M810 Age-related osteoporosis without current pathological fracture: Secondary | ICD-10-CM | POA: Diagnosis present

## 2015-01-03 DIAGNOSIS — N189 Chronic kidney disease, unspecified: Secondary | ICD-10-CM | POA: Diagnosis present

## 2015-01-03 DIAGNOSIS — E785 Hyperlipidemia, unspecified: Secondary | ICD-10-CM | POA: Diagnosis present

## 2015-01-03 DIAGNOSIS — E876 Hypokalemia: Secondary | ICD-10-CM | POA: Diagnosis present

## 2015-01-03 DIAGNOSIS — Z993 Dependence on wheelchair: Secondary | ICD-10-CM | POA: Diagnosis not present

## 2015-01-03 DIAGNOSIS — J449 Chronic obstructive pulmonary disease, unspecified: Secondary | ICD-10-CM | POA: Diagnosis present

## 2015-01-03 DIAGNOSIS — M7989 Other specified soft tissue disorders: Secondary | ICD-10-CM | POA: Diagnosis present

## 2015-01-03 DIAGNOSIS — Z87891 Personal history of nicotine dependence: Secondary | ICD-10-CM

## 2015-01-03 DIAGNOSIS — I129 Hypertensive chronic kidney disease with stage 1 through stage 4 chronic kidney disease, or unspecified chronic kidney disease: Secondary | ICD-10-CM | POA: Diagnosis present

## 2015-01-03 DIAGNOSIS — I2699 Other pulmonary embolism without acute cor pulmonale: Secondary | ICD-10-CM | POA: Diagnosis present

## 2015-01-03 DIAGNOSIS — I4891 Unspecified atrial fibrillation: Secondary | ICD-10-CM | POA: Diagnosis present

## 2015-01-03 DIAGNOSIS — D508 Other iron deficiency anemias: Secondary | ICD-10-CM

## 2015-01-03 DIAGNOSIS — Z96652 Presence of left artificial knee joint: Secondary | ICD-10-CM | POA: Diagnosis present

## 2015-01-03 DIAGNOSIS — Z8719 Personal history of other diseases of the digestive system: Secondary | ICD-10-CM

## 2015-01-03 LAB — COMPREHENSIVE METABOLIC PANEL
ALT: 17 U/L (ref 0–53)
ALT: 16 U/L — AB (ref 17–63)
ALT: 18 U/L (ref 17–63)
AST: 14 U/L (ref 0–37)
AST: 17 U/L (ref 15–41)
AST: 25 U/L (ref 15–41)
Albumin: 2.7 g/dL — ABNORMAL LOW (ref 3.5–5.2)
Albumin: 2.4 g/dL — ABNORMAL LOW (ref 3.5–5.0)
Albumin: 2.7 g/dL — ABNORMAL LOW (ref 3.5–5.0)
Alkaline Phosphatase: 43 U/L (ref 39–117)
Alkaline Phosphatase: 37 U/L — ABNORMAL LOW (ref 38–126)
Alkaline Phosphatase: 43 U/L (ref 38–126)
Anion gap: 5 (ref 5–15)
Anion gap: 8 (ref 5–15)
BILIRUBIN TOTAL: 0.5 mg/dL (ref 0.3–1.2)
BILIRUBIN TOTAL: 0.9 mg/dL (ref 0.3–1.2)
BUN: 25 mg/dL — ABNORMAL HIGH (ref 6–23)
BUN: 22 mg/dL — ABNORMAL HIGH (ref 6–20)
BUN: 21 mg/dL — ABNORMAL HIGH (ref 6–20)
CHLORIDE: 105 mmol/L (ref 101–111)
CO2: 31 mEq/L (ref 19–32)
CO2: 33 mmol/L — ABNORMAL HIGH (ref 22–32)
CO2: 26 mmol/L (ref 22–32)
CREATININE: 0.73 mg/dL (ref 0.50–1.35)
Calcium: 9.1 mg/dL (ref 8.4–10.5)
Calcium: 8.8 mg/dL — ABNORMAL LOW (ref 8.9–10.3)
Calcium: 8.7 mg/dL — ABNORMAL LOW (ref 8.9–10.3)
Chloride: 103 mEq/L (ref 96–112)
Chloride: 104 mmol/L (ref 101–111)
Creatinine, Ser: 0.81 mg/dL (ref 0.61–1.24)
Creatinine, Ser: 0.79 mg/dL (ref 0.61–1.24)
GFR calc Af Amer: >60 mL/min (ref >60)
GFR calc non Af Amer: >60 mL/min (ref >60)
GFR calc non Af Amer: >60 mL/min (ref >60)
GLUCOSE: 91 mg/dL (ref 70–99)
Glucose, Bld: 90 mg/dL (ref 65–99)
Glucose, Bld: 132 mg/dL — ABNORMAL HIGH (ref 65–99)
Potassium: 3.2 mEq/L — ABNORMAL LOW (ref 3.5–5.3)
Potassium: 3.6 mmol/L (ref 3.5–5.1)
Potassium: 3.8 mmol/L (ref 3.5–5.1)
SODIUM: 139 mmol/L (ref 135–145)
Sodium: 142 mEq/L (ref 135–145)
Sodium: 142 mmol/L (ref 135–145)
Total Bilirubin: 0.4 mg/dL (ref 0.2–1.2)
Total Protein: 5.7 g/dL — ABNORMAL LOW (ref 6.0–8.3)
Total Protein: 5.4 g/dL — ABNORMAL LOW (ref 6.5–8.1)
Total Protein: 5.9 g/dL — ABNORMAL LOW (ref 6.5–8.1)

## 2015-01-03 LAB — CBC
HCT: 32.7 % — ABNORMAL LOW (ref 39.0–52.0)
Hemoglobin: 10.4 g/dL — ABNORMAL LOW (ref 13.0–17.0)
MCH: 28.7 pg (ref 26.0–34.0)
MCHC: 31.8 g/dL (ref 30.0–36.0)
MCV: 90.1 fL (ref 78.0–100.0)
PLATELETS: 192 10*3/uL (ref 150–400)
RBC: 3.63 MIL/uL — AB (ref 4.22–5.81)
RDW: 17.2 % — AB (ref 11.5–15.5)
WBC: 17.1 10*3/uL — ABNORMAL HIGH (ref 4.0–10.5)

## 2015-01-03 LAB — CBC WITH DIFFERENTIAL/PLATELET
BASOS ABS: 0 10*3/uL (ref 0.0–0.1)
BASOS PCT: 0 % (ref 0–1)
Eosinophils Absolute: 0.1 10*3/uL (ref 0.0–0.7)
Eosinophils Relative: 1 % (ref 0–5)
HCT: 27.3 % — ABNORMAL LOW (ref 39.0–52.0)
HEMOGLOBIN: 8.6 g/dL — AB (ref 13.0–17.0)
Lymphocytes Relative: 14 % (ref 12–46)
Lymphs Abs: 1.5 10*3/uL (ref 0.7–4.0)
MCH: 28.5 pg (ref 26.0–34.0)
MCHC: 31.5 g/dL (ref 30.0–36.0)
MCV: 90.4 fL (ref 78.0–100.0)
Monocytes Absolute: 0.8 10*3/uL (ref 0.1–1.0)
Monocytes Relative: 7 % (ref 3–12)
NEUTROS ABS: 8.9 10*3/uL — AB (ref 1.7–7.7)
Neutrophils Relative %: 78 % — ABNORMAL HIGH (ref 43–77)
Platelets: 186 10*3/uL (ref 150–400)
RBC: 3.02 MIL/uL — ABNORMAL LOW (ref 4.22–5.81)
RDW: 17.2 % — AB (ref 11.5–15.5)
WBC: 11.4 10*3/uL — ABNORMAL HIGH (ref 4.0–10.5)

## 2015-01-03 LAB — PROTIME-INR
INR: 3.54 — ABNORMAL HIGH (ref 0.00–1.49)
Prothrombin Time: 34.7 seconds — ABNORMAL HIGH (ref 11.6–15.2)

## 2015-01-03 LAB — APTT: aPTT: 36 seconds (ref 24–37)

## 2015-01-03 MED ORDER — FENTANYL 100 MCG/HR TD PT72
100.0000 ug | MEDICATED_PATCH | TRANSDERMAL | Status: DC
  Administered 2015-01-05: 100 ug via TRANSDERMAL
  Filled 2015-01-03: qty 1

## 2015-01-03 MED ORDER — ONDANSETRON HCL 4 MG PO TABS: 4.0000 mg | ORAL_TABLET | Freq: Four times a day (QID) | ORAL | Status: DC | PRN

## 2015-01-03 MED ORDER — HYDROCODONE-ACETAMINOPHEN 5-325 MG PO TABS
1.0000 | ORAL_TABLET | Freq: Once | ORAL | Status: AC
  Administered 2015-01-03: 1 via ORAL
  Filled 2015-01-03: qty 1

## 2015-01-03 MED ORDER — FUROSEMIDE 80 MG PO TABS
80.0000 mg | ORAL_TABLET | Freq: Every day | ORAL | Status: DC
  Administered 2015-01-04 (×2): 80 mg via ORAL
  Filled 2015-01-03 (×2): qty 1

## 2015-01-03 MED ORDER — FUROSEMIDE 80 MG PO TABS: 80.0000 mg | ORAL_TABLET | Freq: Every day | ORAL | Status: DC

## 2015-01-03 MED ORDER — HYDROCODONE-ACETAMINOPHEN 5-325 MG PO TABS
1.0000 | ORAL_TABLET | Freq: Four times a day (QID) | ORAL | Status: DC | PRN
  Administered 2015-01-04 (×4): 1 via ORAL
  Filled 2015-01-03 (×4): qty 1

## 2015-01-03 MED ORDER — NITROGLYCERIN 0.4 MG SL SUBL: 0.4000 mg | SUBLINGUAL_TABLET | SUBLINGUAL | Status: DC | PRN

## 2015-01-03 MED ORDER — OMEGA-3-ACID ETHYL ESTERS 1 G PO CAPS
1.0000 g | ORAL_CAPSULE | Freq: Every day | ORAL | Status: DC
  Administered 2015-01-04 – 2015-01-05 (×2): 1 g via ORAL
  Filled 2015-01-03 (×2): qty 1

## 2015-01-03 MED ORDER — PANTOPRAZOLE SODIUM 40 MG PO TBEC: 40.0000 mg | DELAYED_RELEASE_TABLET | Freq: Every day | ORAL | Status: DC

## 2015-01-03 MED ORDER — TAMSULOSIN HCL 0.4 MG PO CAPS
0.4000 mg | ORAL_CAPSULE | Freq: Every day | ORAL | Status: DC
  Administered 2015-01-04 (×2): 0.4 mg via ORAL
  Filled 2015-01-03 (×2): qty 1

## 2015-01-03 MED ORDER — FENTANYL 100 MCG/HR TD PT72: 100.0000 ug | MEDICATED_PATCH | TRANSDERMAL | Status: DC

## 2015-01-03 MED ORDER — FENOFIBRATE 160 MG PO TABS
160.0000 mg | ORAL_TABLET | Freq: Every day | ORAL | Status: DC
  Administered 2015-01-04 (×2): 160 mg via ORAL
  Filled 2015-01-03 (×2): qty 1

## 2015-01-03 MED ORDER — ALLOPURINOL 100 MG PO TABS
100.0000 mg | ORAL_TABLET | Freq: Two times a day (BID) | ORAL | Status: DC
  Administered 2015-01-04 – 2015-01-05 (×4): 100 mg via ORAL
  Filled 2015-01-03 (×4): qty 1

## 2015-01-03 MED ORDER — VITAMIN C 500 MG PO TABS
500.0000 mg | ORAL_TABLET | Freq: Every day | ORAL | Status: DC
  Administered 2015-01-04 – 2015-01-05 (×2): 500 mg via ORAL
  Filled 2015-01-03 (×2): qty 1

## 2015-01-03 MED ORDER — METOPROLOL TARTRATE 50 MG PO TABS
100.0000 mg | ORAL_TABLET | Freq: Two times a day (BID) | ORAL | Status: DC
  Administered 2015-01-04 – 2015-01-05 (×4): 100 mg via ORAL
  Filled 2015-01-03 (×4): qty 2

## 2015-01-03 MED ORDER — DIPHENHYDRAMINE HCL 25 MG PO CAPS
50.0000 mg | ORAL_CAPSULE | Freq: Once | ORAL | Status: AC
  Administered 2015-01-03: 50 mg via ORAL
  Filled 2015-01-03: qty 2

## 2015-01-03 MED ORDER — ROSUVASTATIN CALCIUM 20 MG PO TABS
40.0000 mg | ORAL_TABLET | Freq: Every evening | ORAL | Status: DC
  Administered 2015-01-04 (×2): 40 mg via ORAL
  Filled 2015-01-03 (×2): qty 2

## 2015-01-03 MED ORDER — HEPARIN BOLUS VIA INFUSION
5000.0000 [IU] | Freq: Once | INTRAVENOUS | Status: AC
Start: 2015-01-03 — End: 2015-01-03
  Administered 2015-01-03: 5000 [IU] via INTRAVENOUS

## 2015-01-03 MED ORDER — ADULT MULTIVITAMIN W/MINERALS CH
1.0000 | ORAL_TABLET | Freq: Every day | ORAL | Status: DC
  Administered 2015-01-04 – 2015-01-05 (×2): 1 via ORAL
  Filled 2015-01-03: qty 1

## 2015-01-03 MED ORDER — CALCIUM CARBONATE-VITAMIN D 500-200 MG-UNIT PO TABS
1.0000 | ORAL_TABLET | Freq: Every day | ORAL | Status: DC
  Administered 2015-01-04 – 2015-01-05 (×2): 1 via ORAL
  Filled 2015-01-03 (×2): qty 1

## 2015-01-03 MED ORDER — METHYLPREDNISOLONE SODIUM SUCC 125 MG IJ SOLR
125.0000 mg | Freq: Once | INTRAMUSCULAR | Status: AC
  Administered 2015-01-03: 125 mg via INTRAVENOUS
  Filled 2015-01-03: qty 2

## 2015-01-03 MED ORDER — SODIUM CHLORIDE 0.9 % IJ SOLN
3.0000 mL | Freq: Two times a day (BID) | INTRAMUSCULAR | Status: DC
  Administered 2015-01-03 – 2015-01-05 (×3): 3 mL via INTRAVENOUS

## 2015-01-03 MED ORDER — SODIUM CHLORIDE 0.9 % IV SOLN
INTRAVENOUS | Status: DC
  Administered 2015-01-03: 100 mL/h via INTRAVENOUS

## 2015-01-03 MED ORDER — PANTOPRAZOLE SODIUM 40 MG PO TBEC
40.0000 mg | DELAYED_RELEASE_TABLET | Freq: Every day | ORAL | Status: DC
  Administered 2015-01-04 – 2015-01-05 (×2): 40 mg via ORAL
  Filled 2015-01-03 (×2): qty 1

## 2015-01-03 MED ORDER — HEPARIN SODIUM (PORCINE) 5000 UNIT/ML IJ SOLN: 4000.0000 [IU] | Freq: Once | INTRAMUSCULAR | Status: DC

## 2015-01-03 MED ORDER — IOHEXOL 350 MG/ML SOLN
100.0000 mL | Freq: Once | INTRAVENOUS | Status: AC | PRN
  Administered 2015-01-03: 100 mL via INTRAVENOUS

## 2015-01-03 MED ORDER — FINASTERIDE 5 MG PO TABS
5.0000 mg | ORAL_TABLET | Freq: Every day | ORAL | Status: DC
  Administered 2015-01-04 – 2015-01-05 (×2): 5 mg via ORAL
  Filled 2015-01-03 (×3): qty 1

## 2015-01-03 MED ORDER — ONDANSETRON HCL 4 MG/2ML IJ SOLN: 4.0000 mg | Freq: Four times a day (QID) | INTRAMUSCULAR | Status: DC | PRN

## 2015-01-03 MED ORDER — ALUM & MAG HYDROXIDE-SIMETH 200-200-20 MG/5ML PO SUSP: 15.0000 mL | ORAL | Status: DC | PRN

## 2015-01-03 MED ORDER — PSYLLIUM 95 % PO PACK
1.0000 | PACK | Freq: Every day | ORAL | Status: DC
Start: 2015-01-03 — End: 2015-01-05
  Administered 2015-01-04: 1 via ORAL
  Filled 2015-01-03 (×3): qty 1

## 2015-01-03 MED ORDER — HEPARIN (PORCINE) IN NACL 100-0.45 UNIT/ML-% IJ SOLN
1700.0000 [IU]/h | INTRAMUSCULAR | Status: DC
  Administered 2015-01-04: 1700 [IU]/h via INTRAVENOUS
  Filled 2015-01-03: qty 250

## 2015-01-03 MED ORDER — FERROUS SULFATE 325 (65 FE) MG PO TABS
325.0000 mg | ORAL_TABLET | Freq: Every day | ORAL | Status: DC
  Administered 2015-01-04 – 2015-01-05 (×2): 325 mg via ORAL
  Filled 2015-01-03 (×2): qty 1

## 2015-01-03 MED ORDER — METOCLOPRAMIDE HCL 10 MG PO TABS
10.0000 mg | ORAL_TABLET | Freq: Every day | ORAL | Status: DC
  Administered 2015-01-04 – 2015-01-05 (×3): 10 mg via ORAL
  Filled 2015-01-03 (×3): qty 1

## 2015-01-03 MED ORDER — TAMSULOSIN HCL 0.4 MG PO CAPS: 0.4000 mg | ORAL_CAPSULE | Freq: Every day | ORAL | Status: DC

## 2015-01-03 MED ORDER — DILTIAZEM HCL ER COATED BEADS 120 MG PO CP24
120.0000 mg | ORAL_CAPSULE | Freq: Every day | ORAL | Status: DC
  Administered 2015-01-04 – 2015-01-05 (×2): 120 mg via ORAL
  Filled 2015-01-03 (×2): qty 1

## 2015-01-03 NOTE — ED Notes (Addendum)
Pt reports right thigh pain, pt has generalized +3 edema to bilateral LE, pt reports MD ordered increase in lasix starting last night.  Pt reports pain shoot to his posterior knee. Pt denies any injury states he has not been out of bed since he got home from the hospital on 5/11. Pt treated for PE during hospitaliztion

## 2015-01-03 NOTE — Progress Notes (Signed)
ANTICOAGULATION CONSULT NOTE - Initial Consult  Pharmacy Consult for Heparin Indication: pulmonary embolus  Allergies  Allergen Reactions  . Iodine     REACTION: rash  . Penicillins     Rapid heart rate; flushed  . Sulfonamide Derivatives     Rapid heart rate; flushed   Patient Measurements: Weight: 295 lb (133.811 kg)      Vital Signs: Temp: 98.2 F (36.8 C) (05/20 2051) Temp Source: Oral (05/20 2051) BP: 141/87 mmHg (05/20 2051) Pulse Rate: 82 (05/20 2051)  Labs:  Recent Labs  01/02/15 1506 01/03/15 1447 01/03/15 1557  HGB 9.8* 8.6*  --   HCT 30.7* 27.3*  --   PLT 207 186  --   APTT  --   --  36  LABPROT  --   --  34.7*  INR  --   --  3.54*  CREATININE 0.73 0.81  --    Estimated Creatinine Clearance: 119.9 mL/min (by C-G formula based on Cr of 0.81).  Medical History: Past Medical History  Diagnosis Date  . Atrial fibrillation     Postop hip surgery, January 2012, digoxin and diltiazem, reverted to sinus in the hospital  . Osteoarthritis   . COPD (chronic obstructive pulmonary disease)     Emphysema  . Depression   . Posttraumatic stress disorder   . GERD (gastroesophageal reflux disease)   . Irritable bowel   . BPH (benign prostatic hyperplasia)   . Urinary tract bacterial infections   . Osteoporosis   . Hypertension   . Dyslipidemia   . CAD (coronary artery disease)     Stents placed in [place] and [place] in the past  /Myoview 2008, no ischemia, /   . Atrial flutter     History of the past with syncope and  ?? H. and consideration, no records  . 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 Hip pain   . Shoulder pain   . Gout   . Peripheral arterial disease     Dopplers Dr.Vyas office October, 2010, apparent occlusion anterior tibial arteries bilaterally., findings suggest hemodynamically significant stenosis of the proximal right provisional femoral artery and the mid left superficial artery.  Ankle-brachial indices indicate moderate  stenosis within both lower extremities  . Chronic kidney disease     Creatinine 2.1, October 13, 2010--> 0.74 10/2012  . Hyperlipidemia     Mixed  . GI bleed   . Ejection fraction     EF 50-55%, echo, May, 2013, severe inferior hypokinesis  . Bilateral pulmonary embolism     a. 10/2012 - V:Q scan high prob for Bilat PE in setting of dyspnea/hemoptysis-->anticoagulation started.  . Right leg DVT     a. 10/2012 - subocclusive DVT R popliteal vein.  Marland Kitchen COPD (chronic obstructive pulmonary disease)    Medications:   (Not in a hospital admission)  Assessment: 74yo morbidly obese male with right leg swelling and a h/o thromboembolic dz.  ED exam shows extensive DVT and PE.  Asked to initiate Heparin.  Pt does have h/o rectal bleeding.  H/H low on admission but no evidence of bleeding per notes.  INR elevated most likely due to Xarelto Rx.    Goal of Therapy:  Heparin level 0.3-0.7 units/ml w/in 24 hrs of initiation Monitor platelets by anticoagulation protocol: Yes   Plan:  Heparin 5000 units IV bolus now x 1 Heparin infusion at 1700 units/hr Heparin level in 8 hours then daily CBC daily while on Heparin F/U plans for  oral anticoagulation Monitor for s/sx of bleeding complications  Hart Robinsons A 01/03/2015,9:23 PM

## 2015-01-03 NOTE — H&P (Signed)
[facility] History and Physical  Bryce Berry YHC:[id number] DOB: 12/19/1940 DOA: 01/03/2015  Referring physician: ER PCP: Glenda Chroman., MD   Chief Complaint: Right swollen leg.  HPI: Bryce Berry is a 74 y.o. male  This is a 74 year old man who was recently hospitalized with rectal bleeding. At this time his Xarelto  was held and after he was discharged he started his anticoagulation back up 3 days ago. He now presents with swelling of the right leg for the last 24 hours. He denies any chest pain or dyspnea. There is no fever. The patient has had a history of thromboembolic disease dating back to approximately 2 years ago when he was found to have bilateral PEs, right lower leg DVT and also atrial fibrillation with rapid ventricular response. At this point he was started on anticoagulation. Evaluation in the emergency room today shows that he has a extensive DVT of the right leg involving the common femoral vein, popliteal and calf  veins. He did not have any symptoms of pulmonary embolism but due to such a large clot burden, a CT angiogram of the chest was done. This in fact did show small filling defect in the left lower lobe, consistent with pulmonary embolism. He is now being admitted for further management.   Review of Systems:  Apart from symptoms above, all systems negative..   Past Medical History  Diagnosis Date  . Atrial fibrillation     Postop hip surgery, January 2012, digoxin and diltiazem, reverted to sinus in the hospital  . Osteoarthritis   . COPD (chronic obstructive pulmonary disease)     Emphysema  . Depression   . Posttraumatic stress disorder   . GERD (gastroesophageal reflux disease)   . Irritable bowel   . BPH (benign prostatic hyperplasia)   . Urinary tract bacterial infections   . Osteoporosis   . Hypertension   . Dyslipidemia   . CAD (coronary artery disease)     Stents placed in [place] and [place] in the past  /Myoview 2008, no ischemia, /     . Atrial flutter     History of the past with syncope and  ?? H. and consideration, no records  . 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 Hip pain   . Shoulder pain   . Gout   . Peripheral arterial disease     Dopplers Dr.Vyas office October, 2010, apparent occlusion anterior tibial arteries bilaterally., findings suggest hemodynamically significant stenosis of the proximal right provisional femoral artery and the mid left superficial artery.  Ankle-brachial indices indicate moderate stenosis within both lower extremities  . Chronic kidney disease     Creatinine 2.1, October 13, 2010--> 0.74 10/2012  . Hyperlipidemia     Mixed  . GI bleed   . Ejection fraction     EF 50-55%, echo, May, 2013, severe inferior hypokinesis  . Bilateral pulmonary embolism     a. 10/2012 - V:Q scan high prob for Bilat PE in setting of dyspnea/hemoptysis-->anticoagulation started.  . Right leg DVT     a. 10/2012 - subocclusive DVT R popliteal vein.  Marland Kitchen COPD (chronic obstructive pulmonary disease)    Past Surgical History  Procedure Laterality Date  . Knee arthroplasty      Right  . Total knee arthroplasty      Left  . Total hip arthroplasty      Right-complicated hx  . Colon resection    . Back surgery      [place]  [place]  . Prostate surgery    . Colonoscopy N/A 03/13/2013    Procedure: COLONOSCOPY;  Surgeon: Rogene Houston, MD;  Location: [facility];  Service: Endoscopy;  Laterality: N/A;  730  . Cataract extraction    . Colonoscopy N/A 12/22/2014    Procedure: COLONOSCOPY;  Surgeon: Rogene Houston, MD;  Location: [facility];  Service: Endoscopy;  Laterality: N/A;  . Givens capsule study N/A 12/24/2014    Procedure: GIVENS CAPSULE STUDY;  Surgeon: Rogene Houston, MD;  Location: [facility];  Service: Endoscopy;  Laterality: N/A;  . Coronary angioplasty with stent placement      x 4   Social History:  reports that he quit smoking about 8 years ago. His smoking use included  Cigarettes. He has a 30 pack-year smoking history. He has never used smokeless tobacco. He reports that he does not drink alcohol or use illicit drugs.  Allergies  Allergen Reactions  . Iodine     REACTION: rash  . Penicillins     Rapid heart rate; flushed  . Sulfonamide Derivatives     Rapid heart rate; flushed    Family History  Problem Relation Age of Onset  . Diabetes Daughter   . Hypertension Daughter   . Obesity Daughter     Prior to Admission medications   Medication Sig Start Date End Date Taking? Authorizing Provider  allopurinol (ZYLOPRIM) 100 MG tablet Take 100 mg by mouth 2 (two) times daily.    Yes Historical Provider, MD  alum & mag hydroxide-simeth (MYLANTA) 200-200-20 MG/5ML suspension Take 15 mLs by mouth as needed for indigestion.    Yes Historical Provider, MD  ascorbic acid (VITAMIN C) 500 MG tablet Take 500 mg by mouth daily.    Yes Historical Provider, MD  calcium-vitamin D (OSCAL WITH D) 500-200 MG-UNIT per tablet Take 1 tablet by mouth daily.    Yes Historical Provider, MD  diltiazem (CARDIZEM CD) 120 MG 24 hr capsule Take 1 capsule (120 mg total) by mouth daily. As directed based off blood pressure readings 12/25/14  Yes Kelvin Cellar, MD  fenofibrate (TRICOR) 145 MG tablet Take 1 tablet (145 mg total) by mouth daily. 08/16/11  Yes Ezra Sites, MD  fentaNYL (DURAGESIC - DOSED MCG/HR) 100 MCG/HR Place 1 patch onto the skin every 3 (three) days. 11/02/14  Yes Historical Provider, MD  Ferrous Sulfate (RA IRON) 27 MG TABS Take 1 tablet by mouth daily.     Yes Historical Provider, MD  finasteride (PROSCAR) 5 MG tablet Take 5 mg by mouth daily.     Yes Historical Provider, MD  furosemide (LASIX) 20 MG tablet Take 2 tablets (40 mg total) by mouth daily. Patient taking differently: Take 80 mg by mouth daily.  11/02/13  Yes Carlena Bjornstad, MD  HYDROcodone-acetaminophen (NORCO/VICODIN) 5-325 MG per tablet Take 1-2 tablets by mouth every 6 (six) hours as needed for  moderate pain or severe pain.  11/18/14  Yes Historical Provider, MD  metoCLOPramide (REGLAN) 10 MG tablet Take 1 tablet (10 mg total) by mouth daily. 05/01/12  Yes Rogene Houston, MD  metoprolol (LOPRESSOR) 100 MG tablet Take 1 tablet (100 mg total) by mouth 2 (two) times daily. 02/07/12  Yes Burna Forts Serpe, PA-C  metroNIDAZOLE (FLAGYL) 500 MG tablet Take 1 tablet (500 mg total) by mouth 3 (three) times daily. 12/25/14  Yes Kelvin Cellar, MD  Multiple Vitamin (MULTIVITAMIN) tablet Take 1 tablet by mouth daily.  Yes Historical Provider, MD  nitroGLYCERIN (NITROSTAT) 0.4 MG SL tablet Place 1 tablet (0.4 mg total) under the tongue every 5 (five) minutes as needed. Patient taking differently: Place 0.4 mg under the tongue every 5 (five) minutes as needed for chest pain.  12/01/12  Yes Carlena Bjornstad, MD  Omega-3 Fatty Acids (FISH OIL) 1000 MG CAPS Take 2 capsules by mouth daily.    Yes Historical Provider, MD  pantoprazole (PROTONIX) 40 MG tablet Take 40 mg by mouth daily.     Yes Historical Provider, MD  psyllium (METAMUCIL SMOOTH TEXTURE) 28 % packet Take 1 packet by mouth at bedtime. 03/13/13  Yes Rogene Houston, MD  rivaroxaban (XARELTO) 20 MG TABS tablet Take 20 mg by mouth daily with supper.   Yes Historical Provider, MD  rosuvastatin (CRESTOR) 40 MG tablet Take 1 tablet (40 mg total) by mouth every evening. 02/07/12  Yes Donney Dice, PA-C  tamsulosin (FLOMAX) 0.4 MG CAPS capsule Take 0.4 mg by mouth daily after supper.   Yes Historical Provider, MD   Physical Exam: Filed Vitals:   01/03/15 1815 01/03/15 1830 01/03/15 1900 01/03/15 2051  BP:  145/110 134/88 141/87  Pulse: 86 94 85 82  Temp:    98.2 F (36.8 C)  TempSrc:    Oral  Resp:  21  18  Weight:      SpO2: 100% 100% 97% 97%    Wt Readings from Last 3 Encounters:  01/03/15 133.811 kg (295 lb)  01/02/15 133.811 kg (295 lb)  12/24/14 136.533 kg (301 lb)    General:  Appears calm and comfortable. He is morbidly obese. He does  not look toxic or septic. Eyes: PERRL, normal lids, irises & conjunctiva ENT: grossly normal hearing, lips & tongue Neck: no LAD, masses or thyromegaly Cardiovascular: Irregularly irregular, consistent with atrial fibrillation. The whole of his right leg is swollen and warm to the touch, consistent with DVT. Telemetry: SR, no arrhythmias  Respiratory: CTA bilaterally, no w/r/r. Normal respiratory effort. No pleural rub. Abdomen: soft, ntnd Skin: no rash or induration seen on limited exam Musculoskeletal: grossly normal tone BUE/BLE Psychiatric: grossly normal mood and affect, speech fluent and appropriate Neurologic: grossly non-focal.          Labs on Admission:  Basic Metabolic Panel:  Recent Labs Lab 01/02/15 1506 01/03/15 1447  NA 142 142  K 3.2* 3.6  CL 103 104  CO2 31 33*  GLUCOSE 91 90  BUN 25* 22*  CREATININE 0.73 0.81  CALCIUM 9.1 8.8*   Liver Function Tests:  Recent Labs Lab 01/02/15 1506 01/03/15 1447  AST 14 17  ALT 17 16*  ALKPHOS 43 37*  BILITOT 0.4 0.5  PROT 5.7* 5.4*  ALBUMIN 2.7* 2.4*   No results for input(s): LIPASE, AMYLASE in the last 168 hours. No results for input(s): AMMONIA in the last 168 hours. CBC:  Recent Labs Lab 01/02/15 1506 01/03/15 1447  WBC 13.5* 11.4*  NEUTROABS  --  8.9*  HGB 9.8* 8.6*  HCT 30.7* 27.3*  MCV 87.0 90.4  PLT 207 186   Cardiac Enzymes: No results for input(s): CKTOTAL, CKMB, CKMBINDEX, TROPONINI in the last 168 hours.  BNP (last 3 results) No results for input(s): BNP in the last 8760 hours.  ProBNP (last 3 results) No results for input(s): PROBNP in the last 8760 hours.  CBG: No results for input(s): GLUCAP in the last 168 hours.  Radiological Exams on Admission: Ct Angio Chest Pe W/cm &/or  Wo Cm  01/03/2015   CLINICAL DATA:  Patient with right thigh pain and generalized edema in the bilateral lower extremities. Patient treated for prior pulmonary embolism.  EXAM: CT ANGIOGRAPHY CHEST WITH  CONTRAST  TECHNIQUE: Multidetector CT imaging of the chest was performed using the standard protocol during bolus administration of intravenous contrast. Multiplanar CT image reconstructions and MIPs were obtained to evaluate the vascular anatomy.  CONTRAST:  166mL OMNIPAQUE IOHEXOL 350 MG/ML SOLN  COMPARISON:  CT abdomen pelvis 12/21/2014  FINDINGS: Mediastinum/Nodes: There is a central filling defect within a left lower lobe subsegmental pulmonary artery (image 160; series 12). No additional filling defects are demonstrated within the pulmonary arterial system.  Visualized thyroid is unremarkable. No enlarged axillary, mediastinal or hilar lymphadenopathy. Heart is enlarged. No pericardial effusion. Atherosclerotic plaque of the thoracic aorta. Large hiatal hernia with a majority of the stomach located in the thorax.  Lungs/Pleura: Central airways are patent. Dependent atelectasis within the left greater than right lower lobes. No definite discrete pulmonary nodules are identified. Trace right pleural effusion. No pneumothorax.  Upper abdomen: No acute process within the visualized upper abdomen.  Musculoskeletal: Thoracic spine degenerative changes. Abnormal appearance of visualized aspect of right glenohumeral joint. Findings are nonspecific may be secondary to extensive degenerative changes. Acute traumatic injury is not excluded.  Review of the MIP images confirms the above findings.  IMPRESSION: 1. Small filling defect within a distal left lower lobe subsegmental pulmonary artery, compatible with pulmonary embolism. 2. Bilateral lower lobe subpleural opacities favored to represent atelectasis. Trace right pleural effusion. 3. Large hiatal hernia. 4. Abnormality of the right glenohumeral joint which is incompletely included on current examination. Findings may be secondary to chronic degenerative changes and intra-articular loose bodies however acute traumatic injury and possible fracture is not excluded.  Recommend correlation with dedicated shoulder radiography. Critical Value/emergent results were called by telephone at the time of interpretation on 01/03/2015 at 8:07 pm to Dr. Fredia Sorrow , who verbally acknowledged these results.   Electronically Signed   By: Lovey Newcomer M.D.   On: 01/03/2015 20:08   US Venous Img Lower Bilateral  01/03/2015   CLINICAL DATA:  Bilateral lower extremity pain.  EXAM: BILATERAL LOWER EXTREMITY VENOUS DOPPLER ULTRASOUND  TECHNIQUE: Gray-scale sonography with graded compression, as well as color Doppler and duplex ultrasound were performed to evaluate the lower extremity deep venous systems from the level of the common femoral vein and including the common femoral, femoral, profunda femoral, popliteal and calf veins including the posterior tibial, peroneal and gastrocnemius veins when visible. The superficial great saphenous vein was also interrogated. Spectral Doppler was utilized to evaluate flow at rest and with distal augmentation maneuvers in the common femoral, femoral and popliteal veins.  COMPARISON:  None.  FINDINGS: RIGHT LOWER EXTREMITY  Common Femoral Vein: Occlusive thrombus.  Saphenofemoral Junction: Occlusive thrombus.  Profunda Femoral Vein: Occlusive thrombus.  Femoral Vein: Occlusive thrombus.  Popliteal Vein: Occlusive thrombus.  Calf Veins: Occlusive thrombus.  Superficial Great Saphenous Vein: No evidence of thrombus. Normal compressibility and flow on color Doppler imaging.  Other findings: There is subcutaneous edema from the right upper thigh to the foot.  LEFT LOWER EXTREMITY  Common Femoral Vein: No evidence of thrombus. Normal compressibility, respiratory phasicity and response to augmentation.  Saphenofemoral Junction: No evidence of thrombus. Normal compressibility and flow on color Doppler imaging.  Profunda Femoral Vein: No evidence of thrombus. Normal compressibility and flow on color Doppler imaging.  Femoral Vein: No evidence of thrombus.  Normal  compressibility, respiratory phasicity and response to augmentation.  Popliteal Vein: No evidence of thrombus. Normal compressibility, respiratory phasicity and response to augmentation.  Calf Veins: No evidence of thrombus. Normal compressibility and flow on color Doppler imaging.  Superficial Great Saphenous Vein: No evidence of thrombus. Normal compressibility and flow on color Doppler imaging.  Other Findings:  There is subcutaneous edema in the left calf.  IMPRESSION: 1. Extensive deep venous thrombosis of the right leg. 2. No evidence of venous thrombosis in the left leg.   Electronically Signed   By: Lorriane Shire M.D.   On: 01/03/2015 15:44   Dg Chest Port 1 View  01/03/2015   CLINICAL DATA:  Bilateral lower leg swelling.  EXAM: PORTABLE CHEST - 1 VIEW  COMPARISON:  Chest radiograph 11/15/2014  FINDINGS: Stable cardiomegaly. No consolidative pulmonary opacities. No pleural effusion or pneumothorax. Regional skeleton is unremarkable.  IMPRESSION: Cardiomegaly.  No new acute cardiopulmonary process.   Electronically Signed   By: Lovey Newcomer M.D.   On: 01/03/2015 15:42    EKG: Independently reviewed. Atrial fibrillation. Rate is controlled. There is no acute ST-T wave changes.  Assessment/Plan   1. Left lower lobe pulmonary embolism. This will be treated with intravenous heparin at least for the next 12 hours. 2. Extensive right leg DVT. This will be treated with intravenous heparin at least for the next 12 hours and then, if patient is stable, convert to oral anticoagulation as before. 3. Atrial fibrillation. The rate is controlled. 4. Hypertension. Stable. 5. Recent history of GI bleed. Hemoglobin is 8.6. His previous hemoglobin was 9.8. Will closely monitor. There is no clinical evidence of another GI bleed.  Further recommendations will depend on patient's hospital progress.   Code Status: Full code.   DVT Prophylaxis: Intravenous heparin  Family Communication: I discussed the plan  with the patient and his wife at the bedside.   Disposition Plan: Home when medically stable.   Time spent: 60 minutes  [facility] Pager [phone]

## 2015-01-03 NOTE — Telephone Encounter (Signed)
.  Per Lelon Perla labs in 3 months.

## 2015-01-03 NOTE — ED Notes (Signed)
Pt alert and oriented.  Reporting improvement in pain level.  No distress noted at present.  Pt waiting for transport to CT.

## 2015-01-03 NOTE — ED Notes (Signed)
Patient transported to CT 

## 2015-01-03 NOTE — ED Provider Notes (Addendum)
CSN: [id number]     Arrival date & time 01/03/15  1400 History   First MD Initiated Contact with Patient 01/03/15 1408     Chief Complaint  Patient presents with  . Leg Pain     (Consider location/radiation/quality/duration/timing/severity/associated sxs/prior Treatment) The history is provided by the patient and the spouse.   74 year old male just discharged from the hospital on May 11 following a GI bleed. Patient was on several toe for past pulmonary embolus. Not a recent pulmonary embolus. Patient had to be stopped on his blood thinner due to the GI bleed just resumed it 3 days ago. Patient had no further bleeding. Patient noted marked swelling to his right leg. Was seen by primary care doctor increased Lasix. But the swelling is gotten bigger. Denies any chest pain or shortness of breath.  Past Medical History  Diagnosis Date  . Atrial fibrillation     Postop hip surgery, January 2012, digoxin and diltiazem, reverted to sinus in the hospital  . Osteoarthritis   . COPD (chronic obstructive pulmonary disease)     Emphysema  . Depression   . Posttraumatic stress disorder   . GERD (gastroesophageal reflux disease)   . Irritable bowel   . BPH (benign prostatic hyperplasia)   . Urinary tract bacterial infections   . Osteoporosis   . Hypertension   . Dyslipidemia   . CAD (coronary artery disease)     Stents placed in [place] and [place] in the past  /Myoview 2008, no ischemia, /   . Atrial flutter     History of the past with syncope and  ?? H. and consideration, no records  . 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 Hip pain   . Shoulder pain   . Gout   . Peripheral arterial disease     Dopplers Dr.Vyas office October, 2010, apparent occlusion anterior tibial arteries bilaterally., findings suggest hemodynamically significant stenosis of the proximal right provisional femoral artery and the mid left superficial artery.  Ankle-brachial indices indicate moderate stenosis  within both lower extremities  . Chronic kidney disease     Creatinine 2.1, October 13, 2010--> 0.74 10/2012  . Hyperlipidemia     Mixed  . GI bleed   . Ejection fraction     EF 50-55%, echo, May, 2013, severe inferior hypokinesis  . Bilateral pulmonary embolism     a. 10/2012 - V:Q scan high prob for Bilat PE in setting of dyspnea/hemoptysis-->anticoagulation started.  . Right leg DVT     a. 10/2012 - subocclusive DVT R popliteal vein.  Marland Kitchen COPD (chronic obstructive pulmonary disease)    Past Surgical History  Procedure Laterality Date  . Knee arthroplasty      Right  . Total knee arthroplasty      Left  . Total hip arthroplasty      Right-complicated hx  . Colon resection    . Back surgery      [place]  . Prostate surgery    . Colonoscopy N/A 03/13/2013    Procedure: COLONOSCOPY;  Surgeon: Rogene Houston, MD;  Location: [facility];  Service: Endoscopy;  Laterality: N/A;  730  . Cataract extraction    . Colonoscopy N/A 12/22/2014    Procedure: COLONOSCOPY;  Surgeon: Rogene Houston, MD;  Location: [facility];  Service: Endoscopy;  Laterality: N/A;  . Givens capsule study N/A 12/24/2014    Procedure: GIVENS CAPSULE STUDY;  Surgeon: Rogene Houston, MD;  Location: [facility];  Service: Endoscopy;  Laterality: N/A;  . Coronary angioplasty with stent placement      x 4   Family History  Problem Relation Age of Onset  . Diabetes Daughter   . Hypertension Daughter   . Obesity Daughter    History  Substance Use Topics  . Smoking status: Former Smoker -- 1.00 packs/day for 30 years    Types: Cigarettes    Quit date: 08/16/2006  . Smokeless tobacco: Never Used     Comment: Patient smoked a pack a day for about 30 years  . Alcohol Use: No    Review of Systems  Constitutional: Negative for fever.  HENT: Negative for congestion.   Eyes: Negative for visual disturbance.  Respiratory: Negative for shortness of breath.   Cardiovascular: Positive for leg swelling.  Negative for chest pain and palpitations.  Gastrointestinal: Negative for abdominal pain and blood in stool.  Genitourinary: Negative for dysuria and hematuria.  Musculoskeletal: Negative for back pain.  Skin: Negative for rash.  Neurological: Negative for headaches.  Hematological: Bruises/bleeds easily.  Psychiatric/Behavioral: Negative for confusion.      Allergies  Iodine; Penicillins; and Sulfonamide derivatives  Home Medications   Prior to Admission medications   Medication Sig Start Date End Date Taking? Authorizing Provider  allopurinol (ZYLOPRIM) 100 MG tablet Take 100 mg by mouth 2 (two) times daily.    Yes Historical Provider, MD  alum & mag hydroxide-simeth (MYLANTA) 200-200-20 MG/5ML suspension Take 15 mLs by mouth as needed for indigestion.    Yes Historical Provider, MD  ascorbic acid (VITAMIN C) 500 MG tablet Take 500 mg by mouth daily.    Yes Historical Provider, MD  calcium-vitamin D (OSCAL WITH D) 500-200 MG-UNIT per tablet Take 1 tablet by mouth daily.    Yes Historical Provider, MD  diltiazem (CARDIZEM CD) 120 MG 24 hr capsule Take 1 capsule (120 mg total) by mouth daily. As directed based off blood pressure readings 12/25/14  Yes Kelvin Cellar, MD  fenofibrate (TRICOR) 145 MG tablet Take 1 tablet (145 mg total) by mouth daily. 08/16/11  Yes Ezra Sites, MD  fentaNYL (DURAGESIC - DOSED MCG/HR) 100 MCG/HR Place 1 patch onto the skin every 3 (three) days. 11/02/14  Yes Historical Provider, MD  Ferrous Sulfate (RA IRON) 27 MG TABS Take 1 tablet by mouth daily.     Yes Historical Provider, MD  finasteride (PROSCAR) 5 MG tablet Take 5 mg by mouth daily.     Yes Historical Provider, MD  furosemide (LASIX) 20 MG tablet Take 2 tablets (40 mg total) by mouth daily. Patient taking differently: Take 80 mg by mouth daily.  11/02/13  Yes Carlena Bjornstad, MD  HYDROcodone-acetaminophen (NORCO/VICODIN) 5-325 MG per tablet Take 1-2 tablets by mouth every 6 (six) hours as needed for  moderate pain or severe pain.  11/18/14  Yes Historical Provider, MD  metoCLOPramide (REGLAN) 10 MG tablet Take 1 tablet (10 mg total) by mouth daily. 05/01/12  Yes Rogene Houston, MD  metoprolol (LOPRESSOR) 100 MG tablet Take 1 tablet (100 mg total) by mouth 2 (two) times daily. 02/07/12  Yes Burna Forts Serpe, PA-C  metroNIDAZOLE (FLAGYL) 500 MG tablet Take 1 tablet (500 mg total) by mouth 3 (three) times daily. 12/25/14  Yes Kelvin Cellar, MD  Multiple Vitamin (MULTIVITAMIN) tablet Take 1 tablet by mouth daily.     Yes Historical Provider, MD  nitroGLYCERIN (NITROSTAT) 0.4 MG SL tablet Place 1 tablet (0.4 mg total) under the tongue every  5 (five) minutes as needed. Patient taking differently: Place 0.4 mg under the tongue every 5 (five) minutes as needed for chest pain.  12/01/12  Yes Carlena Bjornstad, MD  Omega-3 Fatty Acids (FISH OIL) 1000 MG CAPS Take 2 capsules by mouth daily.    Yes Historical Provider, MD  pantoprazole (PROTONIX) 40 MG tablet Take 40 mg by mouth daily.     Yes Historical Provider, MD  psyllium (METAMUCIL SMOOTH TEXTURE) 28 % packet Take 1 packet by mouth at bedtime. 03/13/13  Yes Rogene Houston, MD  rivaroxaban (XARELTO) 20 MG TABS tablet Take 20 mg by mouth daily with supper.   Yes Historical Provider, MD  rosuvastatin (CRESTOR) 40 MG tablet Take 1 tablet (40 mg total) by mouth every evening. 02/07/12  Yes Donney Dice, PA-C  tamsulosin (FLOMAX) 0.4 MG CAPS capsule Take 0.4 mg by mouth daily after supper.   Yes Historical Provider, MD   BP 141/87 mmHg  Pulse 82  Temp(Src) 98.2 F (36.8 C) (Oral)  Resp 18  Wt 295 lb (133.811 kg)  SpO2 97% Physical Exam  Constitutional: He is oriented to person, place, and time. He appears well-developed and well-nourished. No distress.  HENT:  Head: Normocephalic and atraumatic.  Mouth/Throat: Oropharynx is clear and moist.  Eyes: Conjunctivae and EOM are normal. Pupils are equal, round, and reactive to light.  Neck: Normal range of  motion. Neck supple.  Cardiovascular: Normal rate, regular rhythm and normal heart sounds.   Pulmonary/Chest: Effort normal and breath sounds normal. No respiratory distress.  Abdominal: Soft. Bowel sounds are normal. There is no tenderness.  Musculoskeletal: Normal range of motion. He exhibits edema.  Marked edema to the right leg only up to the thigh.  Neurological: He is alert and oriented to person, place, and time. No cranial nerve deficit. He exhibits normal muscle tone. Coordination normal.  Skin: Skin is warm.  Nursing note and vitals reviewed.   ED Course  Procedures (including critical care time) Labs Review Labs Reviewed  CBC WITH DIFFERENTIAL/PLATELET - Abnormal; Notable for the following:    WBC 11.4 (*)    RBC 3.02 (*)    Hemoglobin 8.6 (*)    HCT 27.3 (*)    RDW 17.2 (*)    Neutrophils Relative % 78 (*)    Neutro Abs 8.9 (*)    All other components within normal limits  COMPREHENSIVE METABOLIC PANEL - Abnormal; Notable for the following:    CO2 33 (*)    BUN 22 (*)    Calcium 8.8 (*)    Total Protein 5.4 (*)    Albumin 2.4 (*)    ALT 16 (*)    Alkaline Phosphatase 37 (*)    All other components within normal limits  PROTIME-INR - Abnormal; Notable for the following:    Prothrombin Time 34.7 (*)    INR 3.54 (*)    All other components within normal limits  APTT   Results for orders placed or performed during the hospital encounter of 01/03/15  CBC with Differential/Platelet  Result Value Ref Range   WBC 11.4 (H) 4.0 - 10.5 K/uL   RBC 3.02 (L) 4.22 - 5.81 MIL/uL   Hemoglobin 8.6 (L) 13.0 - 17.0 g/dL   HCT 27.3 (L) 39.0 - 52.0 %   MCV 90.4 78.0 - 100.0 fL   MCH 28.5 26.0 - 34.0 pg   MCHC 31.5 30.0 - 36.0 g/dL   RDW 17.2 (H) 11.5 - 15.5 %   Platelets  186 150 - 400 K/uL   Neutrophils Relative % 78 (H) 43 - 77 %   Neutro Abs 8.9 (H) 1.7 - 7.7 K/uL   Lymphocytes Relative 14 12 - 46 %   Lymphs Abs 1.5 0.7 - 4.0 K/uL   Monocytes Relative 7 3 - 12 %    Monocytes Absolute 0.8 0.1 - 1.0 K/uL   Eosinophils Relative 1 0 - 5 %   Eosinophils Absolute 0.1 0.0 - 0.7 K/uL   Basophils Relative 0 0 - 1 %   Basophils Absolute 0.0 0.0 - 0.1 K/uL  Comprehensive metabolic panel  Result Value Ref Range   Sodium 142 135 - 145 mmol/L   Potassium 3.6 3.5 - 5.1 mmol/L   Chloride 104 101 - 111 mmol/L   CO2 33 (H) 22 - 32 mmol/L   Glucose, Bld 90 65 - 99 mg/dL   BUN 22 (H) 6 - 20 mg/dL   Creatinine, Ser 0.81 0.61 - 1.24 mg/dL   Calcium 8.8 (L) 8.9 - 10.3 mg/dL   Total Protein 5.4 (L) 6.5 - 8.1 g/dL   Albumin 2.4 (L) 3.5 - 5.0 g/dL   AST 17 15 - 41 U/L   ALT 16 (L) 17 - 63 U/L   Alkaline Phosphatase 37 (L) 38 - 126 U/L   Total Bilirubin 0.5 0.3 - 1.2 mg/dL   GFR calc non Af Amer >60 >60 mL/min   GFR calc Af Amer >60 >60 mL/min   Anion gap 5 5 - 15  Protime-INR  Result Value Ref Range   Prothrombin Time 34.7 (H) 11.6 - 15.2 seconds   INR 3.54 (H) 0.00 - 1.49  APTT  Result Value Ref Range   aPTT 36 24 - 37 seconds     Imaging Review Ct Angio Chest Pe W/cm &/or Wo Cm  01/03/2015   CLINICAL DATA:  Patient with right thigh pain and generalized edema in the bilateral lower extremities. Patient treated for prior pulmonary embolism.  EXAM: CT ANGIOGRAPHY CHEST WITH CONTRAST  TECHNIQUE: Multidetector CT imaging of the chest was performed using the standard protocol during bolus administration of intravenous contrast. Multiplanar CT image reconstructions and MIPs were obtained to evaluate the vascular anatomy.  CONTRAST:  172mL OMNIPAQUE IOHEXOL 350 MG/ML SOLN  COMPARISON:  CT abdomen pelvis 12/21/2014  FINDINGS: Mediastinum/Nodes: There is a central filling defect within a left lower lobe subsegmental pulmonary artery (image 160; series 12). No additional filling defects are demonstrated within the pulmonary arterial system.  Visualized thyroid is unremarkable. No enlarged axillary, mediastinal or hilar lymphadenopathy. Heart is enlarged. No pericardial  effusion. Atherosclerotic plaque of the thoracic aorta. Large hiatal hernia with a majority of the stomach located in the thorax.  Lungs/Pleura: Central airways are patent. Dependent atelectasis within the left greater than right lower lobes. No definite discrete pulmonary nodules are identified. Trace right pleural effusion. No pneumothorax.  Upper abdomen: No acute process within the visualized upper abdomen.  Musculoskeletal: Thoracic spine degenerative changes. Abnormal appearance of visualized aspect of right glenohumeral joint. Findings are nonspecific may be secondary to extensive degenerative changes. Acute traumatic injury is not excluded.  Review of the MIP images confirms the above findings.  IMPRESSION: 1. Small filling defect within a distal left lower lobe subsegmental pulmonary artery, compatible with pulmonary embolism. 2. Bilateral lower lobe subpleural opacities favored to represent atelectasis. Trace right pleural effusion. 3. Large hiatal hernia. 4. Abnormality of the right glenohumeral joint which is incompletely included on current examination. Findings may be  secondary to chronic degenerative changes and intra-articular loose bodies however acute traumatic injury and possible fracture is not excluded. Recommend correlation with dedicated shoulder radiography. Critical Value/emergent results were called by telephone at the time of interpretation on 01/03/2015 at 8:07 pm to Dr. Fredia Sorrow , who verbally acknowledged these results.   Electronically Signed   By: Lovey Newcomer M.D.   On: 01/03/2015 20:08   US Venous Img Lower Bilateral  01/03/2015   CLINICAL DATA:  Bilateral lower extremity pain.  EXAM: BILATERAL LOWER EXTREMITY VENOUS DOPPLER ULTRASOUND  TECHNIQUE: Gray-scale sonography with graded compression, as well as color Doppler and duplex ultrasound were performed to evaluate the lower extremity deep venous systems from the level of the common femoral vein and including the common  femoral, femoral, profunda femoral, popliteal and calf veins including the posterior tibial, peroneal and gastrocnemius veins when visible. The superficial great saphenous vein was also interrogated. Spectral Doppler was utilized to evaluate flow at rest and with distal augmentation maneuvers in the common femoral, femoral and popliteal veins.  COMPARISON:  None.  FINDINGS: RIGHT LOWER EXTREMITY  Common Femoral Vein: Occlusive thrombus.  Saphenofemoral Junction: Occlusive thrombus.  Profunda Femoral Vein: Occlusive thrombus.  Femoral Vein: Occlusive thrombus.  Popliteal Vein: Occlusive thrombus.  Calf Veins: Occlusive thrombus.  Superficial Great Saphenous Vein: No evidence of thrombus. Normal compressibility and flow on color Doppler imaging.  Other findings: There is subcutaneous edema from the right upper thigh to the foot.  LEFT LOWER EXTREMITY  Common Femoral Vein: No evidence of thrombus. Normal compressibility, respiratory phasicity and response to augmentation.  Saphenofemoral Junction: No evidence of thrombus. Normal compressibility and flow on color Doppler imaging.  Profunda Femoral Vein: No evidence of thrombus. Normal compressibility and flow on color Doppler imaging.  Femoral Vein: No evidence of thrombus. Normal compressibility, respiratory phasicity and response to augmentation.  Popliteal Vein: No evidence of thrombus. Normal compressibility, respiratory phasicity and response to augmentation.  Calf Veins: No evidence of thrombus. Normal compressibility and flow on color Doppler imaging.  Superficial Great Saphenous Vein: No evidence of thrombus. Normal compressibility and flow on color Doppler imaging.  Other Findings:  There is subcutaneous edema in the left calf.  IMPRESSION: 1. Extensive deep venous thrombosis of the right leg. 2. No evidence of venous thrombosis in the left leg.   Electronically Signed   By: Lorriane Shire M.D.   On: 01/03/2015 15:44   Dg Chest Port 1 View  01/03/2015    CLINICAL DATA:  Bilateral lower leg swelling.  EXAM: PORTABLE CHEST - 1 VIEW  COMPARISON:  Chest radiograph 11/15/2014  FINDINGS: Stable cardiomegaly. No consolidative pulmonary opacities. No pleural effusion or pneumothorax. Regional skeleton is unremarkable.  IMPRESSION: Cardiomegaly.  No new acute cardiopulmonary process.   Electronically Signed   By: Lovey Newcomer M.D.   On: 01/03/2015 15:42     EKG Interpretation   Date/Time:  Friday Jan 03 2015 20:55:38 EDT Ventricular Rate:  85 PR Interval:    QRS Duration: 90 QT Interval:  365 QTC Calculation: 434 R Axis:   38 Text Interpretation:  Atrial fibrillation Low voltage, extremity and  precordial leads No significant change since No significant change since  last tracing Confirmed by Rox Mcgriff  MD, Daquane Aguilar [phone]) on 01/03/2015  8:59:58 PM     CRITICAL CARE Performed by: Fredia Sorrow Total critical care time: 30 Critical care time was exclusive of separately billable procedures and treating other patients. Critical care was necessary to treat or prevent imminent  or life-threatening deterioration. Critical care was time spent personally by me on the following activities: development of treatment plan with patient and/or surrogate as well as nursing, discussions with consultants, evaluation of patient's response to treatment, examination of patient, obtaining history from patient or surrogate, ordering and performing treatments and interventions, ordering and review of laboratory studies, ordering and review of radiographic studies, pulse oximetry and re-evaluation of patient's condition.  MDM   Final diagnoses:  Leg swelling  DVT (deep venous thrombosis), right  Pulmonary embolism    Patient with large and extensive deep vein thrombosis to the right leg. CT angios shows evidence of a acute pulmonary embolus. Patient clinically and no respiratory distress. However since now has a PE will be treated with heparin. Patient started his  role to 3 days ago. 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 Was just discharged home back on May 11. No further bleeding. Patient's hemoglobin is a little borderline but it is above 8. Will need to be watched carefully. heparin ordered. Patient will be admitted to telemetry unit by hospitalist.   Heparin bolus started in the emergency department. EKG has been ordered.   Fredia Sorrow, MD 01/03/15 [id number]  Fredia Sorrow, MD 01/03/15 [id number]  Fredia Sorrow, MD 01/03/15 2100

## 2015-01-04 ENCOUNTER — Encounter (HOSPITAL_COMMUNITY): Payer: Self-pay

## 2015-01-04 LAB — CBC
HEMATOCRIT: 29.3 % — AB (ref 39.0–52.0)
HEMOGLOBIN: 9.3 g/dL — AB (ref 13.0–17.0)
MCH: 28.3 pg (ref 26.0–34.0)
MCHC: 31.7 g/dL (ref 30.0–36.0)
MCV: 89.1 fL (ref 78.0–100.0)
PLATELETS: 211 10*3/uL (ref 150–400)
RBC: 3.29 MIL/uL — AB (ref 4.22–5.81)
RDW: 16.9 % — ABNORMAL HIGH (ref 11.5–15.5)
WBC: 9.5 10*3/uL (ref 4.0–10.5)

## 2015-01-04 LAB — APTT
aPTT: >200 seconds (ref 24–37)
aPTT: 139 seconds — ABNORMAL HIGH (ref 24–37)

## 2015-01-04 LAB — COMPREHENSIVE METABOLIC PANEL
ALBUMIN: 2.6 g/dL — AB (ref 3.5–5.0)
ALK PHOS: 40 U/L (ref 38–126)
ALT: 18 U/L (ref 17–63)
AST: 19 U/L (ref 15–41)
Anion gap: 8 (ref 5–15)
BUN: 20 mg/dL (ref 6–20)
CHLORIDE: 101 mmol/L (ref 101–111)
CO2: 30 mmol/L (ref 22–32)
CREATININE: 0.73 mg/dL (ref 0.61–1.24)
Calcium: 8.8 mg/dL — ABNORMAL LOW (ref 8.9–10.3)
GFR calc Af Amer: >60 mL/min (ref >60)
GLUCOSE: 145 mg/dL — AB (ref 65–99)
Potassium: 2.9 mmol/L — ABNORMAL LOW (ref 3.5–5.1)
SODIUM: 139 mmol/L (ref 135–145)
TOTAL PROTEIN: 5.9 g/dL — AB (ref 6.5–8.1)
Total Bilirubin: 0.6 mg/dL (ref 0.3–1.2)

## 2015-01-04 LAB — MAGNESIUM: MAGNESIUM: 1.3 mg/dL — AB (ref 1.7–2.4)

## 2015-01-04 LAB — PROTIME-INR
INR: 2.5 — ABNORMAL HIGH (ref 0.00–1.49)
Prothrombin Time: 26.7 seconds — ABNORMAL HIGH (ref 11.6–15.2)

## 2015-01-04 LAB — HEPARIN LEVEL (UNFRACTIONATED): HEPARIN UNFRACTIONATED: 1.91 [IU]/mL — AB (ref 0.30–0.70)

## 2015-01-04 MED ORDER — POTASSIUM CHLORIDE CRYS ER 20 MEQ PO TBCR
40.0000 meq | EXTENDED_RELEASE_TABLET | Freq: Three times a day (TID) | ORAL | Status: DC
  Administered 2015-01-04 – 2015-01-05 (×3): 40 meq via ORAL
  Filled 2015-01-04 (×3): qty 2

## 2015-01-04 MED ORDER — POTASSIUM CHLORIDE CRYS ER 20 MEQ PO TBCR
20.0000 meq | EXTENDED_RELEASE_TABLET | Freq: Every day | ORAL | Status: DC
  Administered 2015-01-04: 20 meq via ORAL
  Filled 2015-01-04: qty 1

## 2015-01-04 MED ORDER — METRONIDAZOLE 500 MG PO TABS
500.0000 mg | ORAL_TABLET | Freq: Three times a day (TID) | ORAL | Status: AC
  Administered 2015-01-04 (×2): 500 mg via ORAL
  Filled 2015-01-04 (×2): qty 1

## 2015-01-04 MED ORDER — HEPARIN (PORCINE) IN NACL 100-0.45 UNIT/ML-% IJ SOLN
1100.0000 [IU]/h | INTRAMUSCULAR | Status: DC
  Administered 2015-01-04 (×2): 1300 [IU]/h via INTRAVENOUS
  Filled 2015-01-04: qty 250

## 2015-01-04 NOTE — Progress Notes (Signed)
ANTICOAGULATION CONSULT NOTE - follow up  Pharmacy Consult for Heparin Indication: pulmonary embolus  Allergies  Allergen Reactions  . Iodine     REACTION: rash  . Penicillins     Rapid heart rate; flushed  . Sulfonamide Derivatives     Rapid heart rate; flushed   Patient Measurements: Height: 6\' 2"  (188 cm) Weight: 296 lb 4.8 oz (134.4 kg) IBW/kg (Calculated) : 82.2 HEPARIN DW (KG): 112.2 HEPARIN DW (KG): 112.2  Vital Signs: Temp: 97.5 F (36.4 C) (05/21 0647) Temp Source: Oral (05/21 0647) BP: 148/93 mmHg (05/21 0647) Pulse Rate: 84 (05/21 0647)  Labs:  Recent Labs  01/03/15 1447 01/03/15 1557 01/03/15 2144 01/04/15 0621  HGB 8.6*  --  10.4* 9.3*  HCT 27.3*  --  32.7* 29.3*  PLT 186  --  192 211  APTT  --  36  --  >200*  LABPROT  --  34.7*  --  26.7*  INR  --  3.54*  --  2.50*  HEPARINUNFRC  --   --   --  1.91*  CREATININE 0.81  --  0.79 0.73   Estimated Creatinine Clearance: 121.7 mL/min (by C-G formula based on Cr of 0.73).  Medical History: Past Medical History  Diagnosis Date  . Atrial fibrillation     Postop hip surgery, January 2012, digoxin and diltiazem, reverted to sinus in the hospital  . Osteoarthritis   . COPD (chronic obstructive pulmonary disease)     Emphysema  . Depression   . Posttraumatic stress disorder   . GERD (gastroesophageal reflux disease)   . Irritable bowel   . BPH (benign prostatic hyperplasia)   . Urinary tract bacterial infections   . Osteoporosis   . Hypertension   . Dyslipidemia   . CAD (coronary artery disease)     Stents placed in [place] and [place] in the past  /Myoview 2008, no ischemia, /   . Atrial flutter     History of the past with syncope and  ?? H. and consideration, no records  . 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 Hip pain   . Shoulder pain   . Gout   . Peripheral arterial disease     Dopplers Dr.Vyas office October, 2010, apparent occlusion anterior tibial arteries bilaterally.,  findings suggest hemodynamically significant stenosis of the proximal right provisional femoral artery and the mid left superficial artery.  Ankle-brachial indices indicate moderate stenosis within both lower extremities  . Chronic kidney disease     Creatinine 2.1, October 13, 2010--> 0.74 10/2012  . Hyperlipidemia     Mixed  . GI bleed   . Ejection fraction     EF 50-55%, echo, May, 2013, severe inferior hypokinesis  . Bilateral pulmonary embolism     a. 10/2012 - V:Q scan high prob for Bilat PE in setting of dyspnea/hemoptysis-->anticoagulation started.  . Right leg DVT     a. 10/2012 - subocclusive DVT R popliteal vein.  Marland Kitchen COPD (chronic obstructive pulmonary disease)    Medications:  Prescriptions prior to admission  Medication Sig Dispense Refill Last Dose  . allopurinol (ZYLOPRIM) 100 MG tablet Take 100 mg by mouth 2 (two) times daily.    01/03/2015 at Unknown time  . alum & mag hydroxide-simeth (MYLANTA) 417-408-14 MG/5ML suspension Take 15 mLs by mouth as needed for indigestion.    unknown  . ascorbic acid (VITAMIN C) 500 MG tablet Take 500 mg by mouth daily.    01/03/2015 at Unknown time  .  calcium-vitamin D (OSCAL WITH D) 500-200 MG-UNIT per tablet Take 1 tablet by mouth daily.    01/03/2015 at Unknown time  . diltiazem (CARDIZEM CD) 120 MG 24 hr capsule Take 1 capsule (120 mg total) by mouth daily. As directed based off blood pressure readings 60 capsule 3 01/03/2015 at Unknown time  . fenofibrate (TRICOR) 145 MG tablet Take 1 tablet (145 mg total) by mouth daily. 30 tablet 6 01/02/2015 at Unknown time  . fentaNYL (DURAGESIC - DOSED MCG/HR) 100 MCG/HR Place 1 patch onto the skin every 3 (three) days.  0 01/02/2015 at Unknown time  . Ferrous Sulfate (RA IRON) 27 MG TABS Take 1 tablet by mouth daily.     01/03/2015 at Unknown time  . finasteride (PROSCAR) 5 MG tablet Take 5 mg by mouth daily.     01/03/2015 at Unknown time  . furosemide (LASIX) 20 MG tablet Take 2 tablets (40 mg total) by  mouth daily. (Patient taking differently: Take 80 mg by mouth daily. ) 270 tablet 3 01/02/2015 at Unknown time  . HYDROcodone-acetaminophen (NORCO/VICODIN) 5-325 MG per tablet Take 1-2 tablets by mouth every 6 (six) hours as needed for moderate pain or severe pain.   0 01/03/2015 at Unknown time  . metoCLOPramide (REGLAN) 10 MG tablet Take 1 tablet (10 mg total) by mouth daily. 30 tablet 5 01/02/2015 at Unknown time  . metoprolol (LOPRESSOR) 100 MG tablet Take 1 tablet (100 mg total) by mouth 2 (two) times daily. 180 tablet 3 01/03/2015 at 01000  . metroNIDAZOLE (FLAGYL) 500 MG tablet Take 1 tablet (500 mg total) by mouth 3 (three) times daily. 30 tablet 0 01/03/2015 at Unknown time  . Multiple Vitamin (MULTIVITAMIN) tablet Take 1 tablet by mouth daily.     01/03/2015 at Unknown time  . nitroGLYCERIN (NITROSTAT) 0.4 MG SL tablet Place 1 tablet (0.4 mg total) under the tongue every 5 (five) minutes as needed. (Patient taking differently: Place 0.4 mg under the tongue every 5 (five) minutes as needed for chest pain. ) 25 tablet 3 unknown  . Omega-3 Fatty Acids (FISH OIL) 1000 MG CAPS Take 2 capsules by mouth daily.    01/03/2015 at Unknown time  . pantoprazole (PROTONIX) 40 MG tablet Take 40 mg by mouth daily.     01/03/2015 at Unknown time  . psyllium (METAMUCIL SMOOTH TEXTURE) 28 % packet Take 1 packet by mouth at bedtime.   01/02/2015 at Unknown time  . rivaroxaban (XARELTO) 20 MG TABS tablet Take 20 mg by mouth daily with supper.   01/03/2015 at Unknown time  . rosuvastatin (CRESTOR) 40 MG tablet Take 1 tablet (40 mg total) by mouth every evening. 90 tablet 3 01/02/2015 at Unknown time  . tamsulosin (FLOMAX) 0.4 MG CAPS capsule Take 0.4 mg by mouth daily after supper.   01/02/2015 at Unknown time   Assessment: 74yo morbidly obese male with right leg swelling and a h/o thromboembolic dz.  ED exam shows extensive DVT and PE.  Asked to initiate Heparin.  Pt does have h/o rectal bleeding.  H/H low on admission but  no evidence of bleeding per notes.  INR elevated most likely due to Xarelto Rx (falsely elevated).    APTT is above goal range (will use aPTT to manage heparin rate since pt was on Xarelto).    INR remains elevated but trending down  Heparin level is above target but will use aPTT to manage heparin infusion  CBC appears relatively stable, no evidence of any bleeding  complications  Goal of Therapy:  Heparin level 0.3-0.7 units/ml w/in 24 hrs of initiation Monitor platelets by anticoagulation protocol: Yes   Plan:   HOLD Heparin infusion x 2 hours then  Reduce Heparin infusion to 1300 units/hr  Recheck aPTT in 6 hrs  CBC and aPTT daily while on Heparin  F/U plans for oral anticoagulation  Monitor for s/sx of bleeding complications  Hart Robinsons A 01/04/2015,8:24 AM

## 2015-01-04 NOTE — Progress Notes (Signed)
[facility] PROGRESS NOTE  Bryce Berry VXB:[id number] DOB: May 13, 1941 DOA: 01/03/2015 PCP: Glenda Chroman., MD  Assessment/Plan: Active Problems:   Essential hypertension   Atrial fibrillation   CAD (coronary artery disease)   History of lower GI bleeding   Right leg DVT   PE (pulmonary embolism)   Pulmonary embolism   1. Left lower lobe pulmonary embolism. This will be treated with intravenous heparin , and no evidence of GI bleeding or recurrence of patient will be transitioned back to Xarelto tomorrow. Discussed with patient's wife by the bedside. The patient probably developed the PE and the DVT while his anticoagulation was on hold in the setting of GI bleeding. Patient is anticipated to discharge home tomorrow if not hypoxic and not tachycardic 2. Extensive right leg DVT. As above 3. Hypokalemia replete, check magnesium level 4. Recent blood loss anemia requiring 4 units of packed red blood cells. Hemoglobin has been stable but slowly different down. 5. Atrial fibrillation. The rate is controlled. Continue Cardizem 6. C. difficile colitis during last admission, patient had 2 more doses of Flagyl. Will give these 2 doses and then he would have completed his treatment. 7. Hypertension. Stable. 8. Recent history of GI bleed. Hemoglobin is 8.6. His previous hemoglobin was 9.8. Will closely monitor. There is no clinical evidence of another GI bleed. Continue PPI 9. Dyslipidemia continue Crestor  Code Status: full Family Communication: family updated about patient's clinical progress Disposition Plan:  Anticipate discharge home tomorrow, patient is wheelchair-bound    Brief narrative: 74 year old man who was recently hospitalized with rectal bleeding. At this time his Xarelto was held and after he was discharged he started his anticoagulation back up 3 days ago. He now presents with swelling of the right leg for the last 24 hours. He denies any chest pain or dyspnea.  There is no fever. The patient has had a history of thromboembolic disease dating back to approximately 2 years ago when he was found to have bilateral PEs, right lower leg DVT and also atrial fibrillation with rapid ventricular response. At this point he was started on anticoagulation. Evaluation in the emergency room today shows that he has a extensive DVT of the right leg involving the common femoral vein, popliteal and calf veins. He did not have any symptoms of pulmonary embolism but due to such a large clot burden, a CT angiogram of the chest was done. This in fact did show small filling defect in the left lower lobe, consistent with pulmonary embolism. He is now being admitted for further management.   Consultants:  None  Procedures:  None  Antibiotics: Flagyl  HPI/Subjective: Patient diarrhea has resolved, no shortness of breath or chest pain, no recent evidence of bleeding He started back on Xarelto 3 days ago   Objective: Filed Vitals:   01/03/15 2142 01/03/15 2227 01/03/15 2315 01/04/15 0647  BP:  160/89  148/93  Pulse:  89 92 84  Temp:  98.6 F (37 C)  97.5 F (36.4 C)  TempSrc:  Oral  Oral  Resp:  20 20 20   Height: 6\' 2"  (1.88 m)     Weight: 134.4 kg (296 lb 4.8 oz)     SpO2:  100% 96% 97%    Intake/Output Summary (Last 24 hours) at 01/04/15 0950 Last data filed at 01/04/15 3300  Gross per 24 hour  Intake    420 ml  Output   1600 ml  Net  -1180 ml  Exam:  General: No acute respiratory distress Lungs: Clear to auscultation bilaterally without wheezes or crackles Cardiovascular: Regular rate and rhythm without murmur gallop or rub normal S1 and S2 Abdomen: Nontender, nondistended, soft, bowel sounds positive, no rebound, no ascites, no appreciable mass Extremities: No significant cyanosis, clubbing, or edema bilateral lower extremities      Data Reviewed: Basic Metabolic Panel:  Recent Labs Lab 01/02/15 1506 01/03/15 1447 01/03/15 2144  01/04/15 0621  NA 142 142 139 139  K 3.2* 3.6 3.8 2.9*  CL 103 104 105 101  CO2 31 33* 26 30  GLUCOSE 91 90 132* 145*  BUN 25* 22* 21* 20  CREATININE 0.73 0.81 0.79 0.73  CALCIUM 9.1 8.8* 8.7* 8.8*     Liver Function Tests:  Recent Labs Lab 01/02/15 1506 01/03/15 1447 01/03/15 2144 01/04/15 0621  AST 14 17 25 19   ALT 17 16* 18 18  ALKPHOS 43 37* 43 40  BILITOT 0.4 0.5 0.9 0.6  PROT 5.7* 5.4* 5.9* 5.9*  ALBUMIN 2.7* 2.4* 2.7* 2.6*   No results for input(s): LIPASE, AMYLASE in the last 168 hours. No results for input(s): AMMONIA in the last 168 hours. Lab Results  Component Value Date   INR 2.50* 01/04/2015   INR 3.54* 01/03/2015   INR 1.36 12/22/2014     CBC:  Recent Labs Lab 01/02/15 1506 01/03/15 1447 01/03/15 2144 01/04/15 0621  WBC 13.5* 11.4* 17.1* 9.5  NEUTROABS  --  8.9*  --   --   HGB 9.8* 8.6* 10.4* 9.3*  HCT 30.7* 27.3* 32.7* 29.3*  MCV 87.0 90.4 90.1 89.1  PLT 207 186 192 211     Cardiac Enzymes: No results for input(s): CKTOTAL, CKMB, CKMBINDEX, TROPONINI in the last 168 hours. BNP (last 3 results) No results for input(s): BNP in the last 8760 hours.  ProBNP (last 3 results) No results for input(s): PROBNP in the last 8760 hours.     CBG: No results for input(s): GLUCAP in the last 168 hours.    No results found for this or any previous visit (from the past 240 hour(s)).     Studies: Ct Angio Chest Pe W/cm &/or Wo Cm  01/03/2015   CLINICAL DATA:  Patient with right thigh pain and generalized edema in the bilateral lower extremities. Patient treated for prior pulmonary embolism.  EXAM: CT ANGIOGRAPHY CHEST WITH CONTRAST  TECHNIQUE: Multidetector CT imaging of the chest was performed using the standard protocol during bolus administration of intravenous contrast. Multiplanar CT image reconstructions and MIPs were obtained to evaluate the vascular anatomy.  CONTRAST:  158mL OMNIPAQUE IOHEXOL 350 MG/ML SOLN  COMPARISON:  CT abdomen  pelvis 12/21/2014  FINDINGS: Mediastinum/Nodes: There is a central filling defect within a left lower lobe subsegmental pulmonary artery (image 160; series 12). No additional filling defects are demonstrated within the pulmonary arterial system.  Visualized thyroid is unremarkable. No enlarged axillary, mediastinal or hilar lymphadenopathy. Heart is enlarged. No pericardial effusion. Atherosclerotic plaque of the thoracic aorta. Large hiatal hernia with a majority of the stomach located in the thorax.  Lungs/Pleura: Central airways are patent. Dependent atelectasis within the left greater than right lower lobes. No definite discrete pulmonary nodules are identified. Trace right pleural effusion. No pneumothorax.  Upper abdomen: No acute process within the visualized upper abdomen.  Musculoskeletal: Thoracic spine degenerative changes. Abnormal appearance of visualized aspect of right glenohumeral joint. Findings are nonspecific may be secondary to extensive degenerative changes. Acute traumatic injury is not excluded.  Review of the MIP images confirms the above findings.  IMPRESSION: 1. Small filling defect within a distal left lower lobe subsegmental pulmonary artery, compatible with pulmonary embolism. 2. Bilateral lower lobe subpleural opacities favored to represent atelectasis. Trace right pleural effusion. 3. Large hiatal hernia. 4. Abnormality of the right glenohumeral joint which is incompletely included on current examination. Findings may be secondary to chronic degenerative changes and intra-articular loose bodies however acute traumatic injury and possible fracture is not excluded. Recommend correlation with dedicated shoulder radiography. Critical Value/emergent results were called by telephone at the time of interpretation on 01/03/2015 at 8:07 pm to Dr. Fredia Sorrow , who verbally acknowledged these results.   Electronically Signed   By: Lovey Newcomer M.D.   On: 01/03/2015 20:08   US Venous Img  Lower Bilateral  01/03/2015   CLINICAL DATA:  Bilateral lower extremity pain.  EXAM: BILATERAL LOWER EXTREMITY VENOUS DOPPLER ULTRASOUND  TECHNIQUE: Gray-scale sonography with graded compression, as well as color Doppler and duplex ultrasound were performed to evaluate the lower extremity deep venous systems from the level of the common femoral vein and including the common femoral, femoral, profunda femoral, popliteal and calf veins including the posterior tibial, peroneal and gastrocnemius veins when visible. The superficial great saphenous vein was also interrogated. Spectral Doppler was utilized to evaluate flow at rest and with distal augmentation maneuvers in the common femoral, femoral and popliteal veins.  COMPARISON:  None.  FINDINGS: RIGHT LOWER EXTREMITY  Common Femoral Vein: Occlusive thrombus.  Saphenofemoral Junction: Occlusive thrombus.  Profunda Femoral Vein: Occlusive thrombus.  Femoral Vein: Occlusive thrombus.  Popliteal Vein: Occlusive thrombus.  Calf Veins: Occlusive thrombus.  Superficial Great Saphenous Vein: No evidence of thrombus. Normal compressibility and flow on color Doppler imaging.  Other findings: There is subcutaneous edema from the right upper thigh to the foot.  LEFT LOWER EXTREMITY  Common Femoral Vein: No evidence of thrombus. Normal compressibility, respiratory phasicity and response to augmentation.  Saphenofemoral Junction: No evidence of thrombus. Normal compressibility and flow on color Doppler imaging.  Profunda Femoral Vein: No evidence of thrombus. Normal compressibility and flow on color Doppler imaging.  Femoral Vein: No evidence of thrombus. Normal compressibility, respiratory phasicity and response to augmentation.  Popliteal Vein: No evidence of thrombus. Normal compressibility, respiratory phasicity and response to augmentation.  Calf Veins: No evidence of thrombus. Normal compressibility and flow on color Doppler imaging.  Superficial Great Saphenous Vein: No  evidence of thrombus. Normal compressibility and flow on color Doppler imaging.  Other Findings:  There is subcutaneous edema in the left calf.  IMPRESSION: 1. Extensive deep venous thrombosis of the right leg. 2. No evidence of venous thrombosis in the left leg.   Electronically Signed   By: Lorriane Shire M.D.   On: 01/03/2015 15:44   Dg Chest Port 1 View  01/03/2015   CLINICAL DATA:  Bilateral lower leg swelling.  EXAM: PORTABLE CHEST - 1 VIEW  COMPARISON:  Chest radiograph 11/15/2014  FINDINGS: Stable cardiomegaly. No consolidative pulmonary opacities. No pleural effusion or pneumothorax. Regional skeleton is unremarkable.  IMPRESSION: Cardiomegaly.  No new acute cardiopulmonary process.   Electronically Signed   By: Lovey Newcomer M.D.   On: 01/03/2015 15:42      No results found for: HGBA1C Lab Results  Component Value Date   CREATININE 0.73 01/04/2015       Scheduled Meds: . allopurinol  100 mg Oral BID  . calcium-vitamin D  1 tablet Oral Daily  . diltiazem  120 mg Oral Daily  . fenofibrate  160 mg Oral QHS  . [START ON 01/05/2015] fentaNYL  100 mcg Transdermal Q3 days  . ferrous sulfate  325 mg Oral Daily  . finasteride  5 mg Oral Daily  . furosemide  80 mg Oral QHS  . metoCLOPramide  10 mg Oral Daily  . metoprolol  100 mg Oral BID  . multivitamin with minerals  1 tablet Oral Daily  . omega-3 acid ethyl esters  1 g Oral Daily  . pantoprazole  40 mg Oral Daily  . potassium chloride  20 mEq Oral Daily  . psyllium  1 packet Oral QHS  . rosuvastatin  40 mg Oral QPM  . sodium chloride  3 mL Intravenous Q12H  . tamsulosin  0.4 mg Oral QHS  . ascorbic acid  500 mg Oral Daily   Continuous Infusions: . sodium chloride 100 mL/hr (01/03/15 1737)  . heparin      Active Problems:   Essential hypertension   Atrial fibrillation   CAD (coronary artery disease)   History of lower GI bleeding   Right leg DVT   PE (pulmonary embolism)   Pulmonary embolism    Time spent: 45  minutes   [facility] Pager [phone]. If 7PM-7AM, please contact night-coverage at [email or web address], password [facility] 01/04/2015, 9:50 AM  LOS: 1 day

## 2015-01-04 NOTE — Progress Notes (Signed)
CRITICAL VALUE ALERT  Critical value received:  PT >200  Date of notification:  01/04/15  Time of notification:  0820  Critical value read back:Yes.    Nurse who received alert:  Jillyn Hidden, RN  MD notified (1st page):  Abrol  Time of first page:  0823  MD notified (2nd page):  Time of second page:  Responding MD:  Allyson Sabal  Time MD responded:  [phone]

## 2015-01-04 NOTE — Progress Notes (Signed)
ANTICOAGULATION CONSULT NOTE - follow up  Pharmacy Consult for Heparin Indication: pulmonary embolus  Allergies  Allergen Reactions  . Iodine     REACTION: rash  . Penicillins     Rapid heart rate; flushed  . Sulfonamide Derivatives     Rapid heart rate; flushed   Patient Measurements: Height: 6\' 2"  (188 cm) Weight: 296 lb 4.8 oz (134.4 kg) IBW/kg (Calculated) : 82.2 HEPARIN DW (KG): 112.2 HEPARIN DW (KG): 112.2  Vital Signs: Temp: 98.4 F (36.9 C) (05/21 1513) Temp Source: Oral (05/21 0647) BP: 126/83 mmHg (05/21 1513) Pulse Rate: 76 (05/21 1513)  Labs:  Recent Labs  01/03/15 1447 01/03/15 1557 01/03/15 2144 01/04/15 0621 01/04/15 1531  HGB 8.6*  --  10.4* 9.3*  --   HCT 27.3*  --  32.7* 29.3*  --   PLT 186  --  192 211  --   APTT  --  36  --  >200* 139*  LABPROT  --  34.7*  --  26.7*  --   INR  --  3.54*  --  2.50*  --   HEPARINUNFRC  --   --   --  1.91*  --   CREATININE 0.81  --  0.79 0.73  --    Estimated Creatinine Clearance: 121.7 mL/min (by C-G formula based on Cr of 0.73).  Medical History: Past Medical History  Diagnosis Date  . Atrial fibrillation     Postop hip surgery, January 2012, digoxin and diltiazem, reverted to sinus in the hospital  . Osteoarthritis   . COPD (chronic obstructive pulmonary disease)     Emphysema  . Depression   . Posttraumatic stress disorder   . GERD (gastroesophageal reflux disease)   . Irritable bowel   . BPH (benign prostatic hyperplasia)   . Urinary tract bacterial infections   . Osteoporosis   . Hypertension   . Dyslipidemia   . CAD (coronary artery disease)     Stents placed in [place] and [place] in the past  /Myoview 2008, no ischemia, /   . Atrial flutter     History of the past with syncope and  ?? H. and consideration, no records  . 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 Hip pain   . Shoulder pain   . Gout   . Peripheral arterial disease     Dopplers Dr.Vyas office October, 2010, apparent  occlusion anterior tibial arteries bilaterally., findings suggest hemodynamically significant stenosis of the proximal right provisional femoral artery and the mid left superficial artery.  Ankle-brachial indices indicate moderate stenosis within both lower extremities  . Chronic kidney disease     Creatinine 2.1, October 13, 2010--> 0.74 10/2012  . Hyperlipidemia     Mixed  . GI bleed   . Ejection fraction     EF 50-55%, echo, May, 2013, severe inferior hypokinesis  . Bilateral pulmonary embolism     a. 10/2012 - V:Q scan high prob for Bilat PE in setting of dyspnea/hemoptysis-->anticoagulation started.  . Right leg DVT     a. 10/2012 - subocclusive DVT R popliteal vein.  Marland Kitchen COPD (chronic obstructive pulmonary disease)    Medications:  Prescriptions prior to admission  Medication Sig Dispense Refill Last Dose  . allopurinol (ZYLOPRIM) 100 MG tablet Take 100 mg by mouth 2 (two) times daily.    01/03/2015 at Unknown time  . alum & mag hydroxide-simeth (MYLANTA) 097-353-29 MG/5ML suspension Take 15 mLs by mouth as needed for indigestion.  unknown  . ascorbic acid (VITAMIN C) 500 MG tablet Take 500 mg by mouth daily.    01/03/2015 at Unknown time  . calcium-vitamin D (OSCAL WITH D) 500-200 MG-UNIT per tablet Take 1 tablet by mouth daily.    01/03/2015 at Unknown time  . diltiazem (CARDIZEM CD) 120 MG 24 hr capsule Take 1 capsule (120 mg total) by mouth daily. As directed based off blood pressure readings 60 capsule 3 01/03/2015 at Unknown time  . fenofibrate (TRICOR) 145 MG tablet Take 1 tablet (145 mg total) by mouth daily. 30 tablet 6 01/02/2015 at Unknown time  . fentaNYL (DURAGESIC - DOSED MCG/HR) 100 MCG/HR Place 1 patch onto the skin every 3 (three) days.  0 01/02/2015 at Unknown time  . Ferrous Sulfate (RA IRON) 27 MG TABS Take 1 tablet by mouth daily.     01/03/2015 at Unknown time  . finasteride (PROSCAR) 5 MG tablet Take 5 mg by mouth daily.     01/03/2015 at Unknown time  . furosemide  (LASIX) 20 MG tablet Take 2 tablets (40 mg total) by mouth daily. (Patient taking differently: Take 80 mg by mouth daily. ) 270 tablet 3 01/02/2015 at Unknown time  . HYDROcodone-acetaminophen (NORCO/VICODIN) 5-325 MG per tablet Take 1-2 tablets by mouth every 6 (six) hours as needed for moderate pain or severe pain.   0 01/03/2015 at Unknown time  . metoCLOPramide (REGLAN) 10 MG tablet Take 1 tablet (10 mg total) by mouth daily. 30 tablet 5 01/02/2015 at Unknown time  . metoprolol (LOPRESSOR) 100 MG tablet Take 1 tablet (100 mg total) by mouth 2 (two) times daily. 180 tablet 3 01/03/2015 at 01000  . metroNIDAZOLE (FLAGYL) 500 MG tablet Take 1 tablet (500 mg total) by mouth 3 (three) times daily. 30 tablet 0 01/03/2015 at Unknown time  . Multiple Vitamin (MULTIVITAMIN) tablet Take 1 tablet by mouth daily.     01/03/2015 at Unknown time  . nitroGLYCERIN (NITROSTAT) 0.4 MG SL tablet Place 1 tablet (0.4 mg total) under the tongue every 5 (five) minutes as needed. (Patient taking differently: Place 0.4 mg under the tongue every 5 (five) minutes as needed for chest pain. ) 25 tablet 3 unknown  . Omega-3 Fatty Acids (FISH OIL) 1000 MG CAPS Take 2 capsules by mouth daily.    01/03/2015 at Unknown time  . pantoprazole (PROTONIX) 40 MG tablet Take 40 mg by mouth daily.     01/03/2015 at Unknown time  . psyllium (METAMUCIL SMOOTH TEXTURE) 28 % packet Take 1 packet by mouth at bedtime.   01/02/2015 at Unknown time  . rivaroxaban (XARELTO) 20 MG TABS tablet Take 20 mg by mouth daily with supper.   01/03/2015 at Unknown time  . rosuvastatin (CRESTOR) 40 MG tablet Take 1 tablet (40 mg total) by mouth every evening. 90 tablet 3 01/02/2015 at Unknown time  . tamsulosin (FLOMAX) 0.4 MG CAPS capsule Take 0.4 mg by mouth daily after supper.   01/02/2015 at Unknown time   Assessment: 74yo morbidly obese male with right leg swelling and a h/o thromboembolic dz.  ED exam shows extensive DVT and PE.  Asked to initiate Heparin.  Pt does  have h/o rectal bleeding.  H/H low on admission but no evidence of bleeding per notes.  INR elevated most likely due to Xarelto Rx (falsely elevated).    APTT is above goal range (will use aPTT to manage heparin rate since pt was on Xarelto).    INR remains elevated but trending  down  Heparin level is above target but will use aPTT to manage heparin infusion  CBC appears relatively stable, no evidence of any bleeding complications  Goal of Therapy:  APTT 60-90 Monitor platelets by anticoagulation protocol: Yes   Plan:   Reduce Heparin infusion to 1100 units/hr  CBC and aPTT daily while on Heparin  Monitor for s/sx of bleeding complications  Hart Robinsons A 01/04/2015,5:37 PM

## 2015-01-05 LAB — CBC
HCT: 30.1 % — ABNORMAL LOW (ref 39.0–52.0)
Hemoglobin: 9.5 g/dL — ABNORMAL LOW (ref 13.0–17.0)
MCH: 28.2 pg (ref 26.0–34.0)
MCHC: 31.6 g/dL (ref 30.0–36.0)
MCV: 89.3 fL (ref 78.0–100.0)
Platelets: 225 10*3/uL (ref 150–400)
RBC: 3.37 MIL/uL — AB (ref 4.22–5.81)
RDW: 17.1 % — AB (ref 11.5–15.5)
WBC: 12.6 10*3/uL — ABNORMAL HIGH (ref 4.0–10.5)

## 2015-01-05 LAB — COMPREHENSIVE METABOLIC PANEL
ALBUMIN: 2.6 g/dL — AB (ref 3.5–5.0)
ALK PHOS: 39 U/L (ref 38–126)
ALT: 16 U/L — ABNORMAL LOW (ref 17–63)
AST: 15 U/L (ref 15–41)
Anion gap: 7 (ref 5–15)
BUN: 23 mg/dL — ABNORMAL HIGH (ref 6–20)
CO2: 31 mmol/L (ref 22–32)
Calcium: 9.1 mg/dL (ref 8.9–10.3)
Chloride: 101 mmol/L (ref 101–111)
Creatinine, Ser: 0.79 mg/dL (ref 0.61–1.24)
GFR calc non Af Amer: >60 mL/min (ref >60)
GLUCOSE: 134 mg/dL — AB (ref 65–99)
POTASSIUM: 3.8 mmol/L (ref 3.5–5.1)
Sodium: 139 mmol/L (ref 135–145)
TOTAL PROTEIN: 5.9 g/dL — AB (ref 6.5–8.1)
Total Bilirubin: 0.5 mg/dL (ref 0.3–1.2)

## 2015-01-05 LAB — APTT: aPTT: 75 seconds — ABNORMAL HIGH (ref 24–37)

## 2015-01-05 MED ORDER — RIVAROXABAN 15 MG PO TABS: 15.0000 mg | ORAL_TABLET | Freq: Two times a day (BID) | ORAL | Status: AC

## 2015-01-05 MED ORDER — MAGNESIUM SULFATE 4 GM/100ML IV SOLN: 4.0000 g | Freq: Once | INTRAVENOUS | Status: DC

## 2015-01-05 MED ORDER — MAGNESIUM OXIDE 400 (241.3 MG) MG PO TABS: 400.0000 mg | ORAL_TABLET | Freq: Two times a day (BID) | ORAL | Status: DC

## 2015-01-05 MED ORDER — RIVAROXABAN 15 MG PO TABS
15.0000 mg | ORAL_TABLET | Freq: Two times a day (BID) | ORAL | Status: DC
  Administered 2015-01-05: 15 mg via ORAL
  Filled 2015-01-05: qty 1

## 2015-01-05 MED ORDER — RIVAROXABAN 20 MG PO TABS: 20.0000 mg | ORAL_TABLET | Freq: Every day | ORAL | Status: AC

## 2015-01-05 MED ORDER — RIVAROXABAN 20 MG PO TABS: 20.0000 mg | ORAL_TABLET | Freq: Every day | ORAL | Status: DC

## 2015-01-05 MED ORDER — DIPHENHYDRAMINE HCL 25 MG PO CAPS
25.0000 mg | ORAL_CAPSULE | Freq: Four times a day (QID) | ORAL | Status: DC | PRN
  Administered 2015-01-05: 25 mg via ORAL
  Filled 2015-01-05: qty 1

## 2015-01-05 MED ORDER — MAGNESIUM SULFATE 2 GM/50ML IV SOLN: 2.0000 g | INTRAVENOUS | Status: AC

## 2015-01-05 MED ORDER — POTASSIUM CHLORIDE CRYS ER 20 MEQ PO TBCR: 40.0000 meq | EXTENDED_RELEASE_TABLET | Freq: Every day | ORAL | Status: DC

## 2015-01-05 MED ORDER — MAGNESIUM OXIDE 400 (241.3 MG) MG PO TABS: 400.0000 mg | ORAL_TABLET | Freq: Two times a day (BID) | ORAL | Status: AC

## 2015-01-05 NOTE — Progress Notes (Signed)
ANTICOAGULATION CONSULT NOTE - Initial Consult  Pharmacy Consult for Xarelto Indication: pulmonary embolus  Allergies  Allergen Reactions  . Iodine     REACTION: rash  . Penicillins     Rapid heart rate; flushed  . Sulfonamide Derivatives     Rapid heart rate; flushed   Patient Measurements: Height: 6\' 2"  (188 cm) Weight: 296 lb 4.8 oz (134.4 kg) IBW/kg (Calculated) : 82.2  Vital Signs: Temp: 98.4 F (36.9 C) (05/22 0621) Temp Source: Oral (05/22 0621) BP: 156/98 mmHg (05/22 0621) Pulse Rate: 90 (05/22 0621)  Labs:  Recent Labs  01/03/15 1447  01/03/15 1557 01/03/15 2144 01/04/15 0621 01/04/15 1531 01/05/15 0638  HGB 8.6*  --   --  10.4* 9.3*  --  9.5*  HCT 27.3*  --   --  32.7* 29.3*  --  30.1*  PLT 186  --   --  192 211  --  225  APTT  --   < > 36  --  >200* 139* 75*  LABPROT  --   --  34.7*  --  26.7*  --   --   INR  --   --  3.54*  --  2.50*  --   --   HEPARINUNFRC  --   --   --   --  1.91*  --   --   CREATININE 0.81  --   --  0.79 0.73  --   --   < > = values in this interval not displayed.  Estimated Creatinine Clearance: 121.7 mL/min (by C-G formula based on Cr of 0.73).  Medical History: Past Medical History  Diagnosis Date  . Atrial fibrillation     Postop hip surgery, January 2012, digoxin and diltiazem, reverted to sinus in the hospital  . Osteoarthritis   . COPD (chronic obstructive pulmonary disease)     Emphysema  . Depression   . Posttraumatic stress disorder   . GERD (gastroesophageal reflux disease)   . Irritable bowel   . BPH (benign prostatic hyperplasia)   . Urinary tract bacterial infections   . Osteoporosis   . Hypertension   . Dyslipidemia   . CAD (coronary artery disease)     Stents placed in [place] and [place] in the past  /Myoview 2008, no ischemia, /   . Atrial flutter     History of the past with syncope and  ?? H. and consideration, no records  . 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 Hip pain   . Shoulder  pain   . Gout   . Peripheral arterial disease     Dopplers Dr.Vyas office October, 2010, apparent occlusion anterior tibial arteries bilaterally., findings suggest hemodynamically significant stenosis of the proximal right provisional femoral artery and the mid left superficial artery.  Ankle-brachial indices indicate moderate stenosis within both lower extremities  . Chronic kidney disease     Creatinine 2.1, October 13, 2010--> 0.74 10/2012  . Hyperlipidemia     Mixed  . GI bleed   . Ejection fraction     EF 50-55%, echo, May, 2013, severe inferior hypokinesis  . Bilateral pulmonary embolism     a. 10/2012 - V:Q scan high prob for Bilat PE in setting of dyspnea/hemoptysis-->anticoagulation started.  . Right leg DVT     a. 10/2012 - subocclusive DVT R popliteal vein.  Marland Kitchen COPD (chronic obstructive pulmonary disease)    Medications:  Scheduled:  . allopurinol  100 mg Oral BID  . calcium-vitamin  D  1 tablet Oral Daily  . diltiazem  120 mg Oral Daily  . fenofibrate  160 mg Oral QHS  . fentaNYL  100 mcg Transdermal Q3 days  . ferrous sulfate  325 mg Oral Daily  . finasteride  5 mg Oral Daily  . furosemide  80 mg Oral QHS  . metoCLOPramide  10 mg Oral Daily  . metoprolol  100 mg Oral BID  . multivitamin with minerals  1 tablet Oral Daily  . omega-3 acid ethyl esters  1 g Oral Daily  . pantoprazole  40 mg Oral Daily  . potassium chloride  40 mEq Oral TID  . psyllium  1 packet Oral QHS  . Rivaroxaban  15 mg Oral BID WC  . [START ON 01/26/2015] rivaroxaban  20 mg Oral Daily  . rosuvastatin  40 mg Oral QPM  . sodium chloride  3 mL Intravenous Q12H  . tamsulosin  0.4 mg Oral QHS  . ascorbic acid  500 mg Oral Daily   Assessment: 74yo male with h/o DVT / PE who was previously on Xarelto.  Med was held for procedure and patient developed new PE / DVT while [place] was on hold.  Pt has been treated with IV Heparin this admission and now being transitioned back to Xarelto.  CBC is stable and no  bleeding reported.    Goal of Therapy:  Full anticoagulation with Xarelto for VTE treatment Monitor platelets by anticoagulation protocol: Yes   Plan:  Xarelto 15mg  po BID x 21 days (due to new occurrence) then Xarelto 20mg  PO daily Monitor CBC & for s/sx of bleeding complications Provide additional Xarelto education for patient  Ena Dawley 01/05/2015,7:42 AM

## 2015-01-05 NOTE — Progress Notes (Signed)
Utilization review Completed Wali Reinheimer RN BSN   

## 2015-01-05 NOTE — Discharge Summary (Signed)
Physician Discharge Summary  Bryce Berry MRN: [id number] DOB/AGE: 1941-01-29 74 y.o.  PCP: Glenda Chroman., MD   Admit date: 01/03/2015 Discharge date: 01/05/2015  Discharge Diagnoses:   Active Problems:   Essential hypertension   Atrial fibrillation   CAD (coronary artery disease)   History of lower GI bleeding   Right leg DVT   PE (pulmonary embolism)   Pulmonary embolism  Recommendations Follow-up with PCP in 3-5 days CBC, CMP, magnesium and 3-5 days    Medication List    STOP taking these medications        metroNIDAZOLE 500 MG tablet  Commonly known as:  FLAGYL      TAKE these medications        allopurinol 100 MG tablet  Commonly known as:  ZYLOPRIM  Take 100 mg by mouth 2 (two) times daily.     ascorbic acid 500 MG tablet  Commonly known as:  VITAMIN C  Take 500 mg by mouth daily.     calcium-vitamin D 500-200 MG-UNIT per tablet  Commonly known as:  OSCAL WITH D  Take 1 tablet by mouth daily.     diltiazem 120 MG 24 hr capsule  Commonly known as:  CARDIZEM CD  Take 1 capsule (120 mg total) by mouth daily. As directed based off blood pressure readings     fenofibrate 145 MG tablet  Commonly known as:  TRICOR  Take 1 tablet (145 mg total) by mouth daily.     fentaNYL 100 MCG/HR  Commonly known as:  DURAGESIC - dosed mcg/hr  Place 1 patch onto the skin every 3 (three) days.     finasteride 5 MG tablet  Commonly known as:  PROSCAR  Take 5 mg by mouth daily.     Fish Oil 1000 MG Caps  Take 2 capsules by mouth daily.     furosemide 20 MG tablet  Commonly known as:  LASIX  Take 2 tablets (40 mg total) by mouth daily.     HYDROcodone-acetaminophen 5-325 MG per tablet  Commonly known as:  NORCO/VICODIN  Take 1-2 tablets by mouth every 6 (six) hours as needed for moderate pain or severe pain.     metoCLOPramide 10 MG tablet  Commonly known as:  REGLAN  Take 1 tablet (10 mg total) by mouth daily.     metoprolol 100 MG tablet  Commonly known  as:  LOPRESSOR  Take 1 tablet (100 mg total) by mouth 2 (two) times daily.     multivitamin tablet  Take 1 tablet by mouth daily.     MYLANTA 200-200-20 MG/5ML suspension  Generic drug:  alum & mag hydroxide-simeth  Take 15 mLs by mouth as needed for indigestion.     nitroGLYCERIN 0.4 MG SL tablet  Commonly known as:  NITROSTAT  Place 1 tablet (0.4 mg total) under the tongue every 5 (five) minutes as needed.     pantoprazole 40 MG tablet  Commonly known as:  PROTONIX  Take 40 mg by mouth daily.     potassium chloride SA 20 MEQ tablet  Commonly known as:  K-DUR,KLOR-CON  Take 2 tablets (40 mEq total) by mouth daily.     psyllium 28 % packet  Commonly known as:  METAMUCIL SMOOTH TEXTURE  Take 1 packet by mouth at bedtime.     RA IRON 27 MG Tabs  Generic drug:  Ferrous Sulfate  Take 1 tablet by mouth daily.     Rivaroxaban 15 MG Tabs tablet  Commonly known  as:  XARELTO  Take 1 tablet (15 mg total) by mouth 2 (two) times daily with a meal.     rivaroxaban 20 MG Tabs tablet  Commonly known as:  XARELTO  Take 1 tablet (20 mg total) by mouth daily.  Start taking on:  01/27/2015     rosuvastatin 40 MG tablet  Commonly known as:  CRESTOR  Take 1 tablet (40 mg total) by mouth every evening.     tamsulosin 0.4 MG Caps capsule  Commonly known as:  FLOMAX  Take 0.4 mg by mouth daily after supper.         Discharge Condition: Stable    Disposition: 01-Home or Self Care   Consults: None   Significant Diagnostic Studies:  Ct Abdomen Pelvis Wo Contrast  12/21/2014   CLINICAL DATA:  74 year old male with a history of rectal bleeding  EXAM: CT ABDOMEN AND PELVIS WITHOUT CONTRAST  TECHNIQUE: Multidetector CT imaging of the abdomen and pelvis was performed following the standard protocol without IV contrast.  COMPARISON:  CT 01/05/2011  FINDINGS: Lower chest:  Unremarkable appearance of the superficial soft tissues of the chest wall.  Heart size within normal limits.  Calcifications in the distribution of the right coronary artery, circumflex coronary artery, and the LAD. Calcifications of the aortic arch. No pericardial fluid/ thickening.  Large hiatal hernia again demonstrated.  Respiratory motion somewhat limits evaluation of the lungs for small nodules. Trace right pleural effusion. No confluent airspace disease.  Abdomen/ pelvis:  Unremarkable appearance of liver and spleen. Unremarkable appearance of pancreas. Unremarkable appearance the bilateral adrenal glands. Unremarkable gallbladder.  No evidence of hydronephrosis or nephrolithiasis. Unremarkable appearance of the course of the bilateral ureters. Distal ureters not evaluated secondary to streak artifact.  No intraperitoneal air or significant free fluid.  Urinary bladder partially distended with urine. Lobulated contour along the right may represent a diverticulum.  Surgical changes of prior partial colectomy in the region of the sigmoid. There are colonic diverticula throughout the colon. There is subtle stranding within the mesenteric adjacent to the descending colon (image 52 of series 2). Fluid filled colon, including the rectum.  Enteric contrast extends through the length of the GI system. No abnormally distended small bowel.  No mesenteric adenopathy.  No retroperitoneal adenopathy.  Bilateral fact containing inguinal hernia.  Surgical changes of bilateral total hip arthroplasty contributing to streak artifact at the pelvis.  No displaced fracture identified. Multilevel degenerative changes of the visualized spine.  Atrophy and fatty infiltration of the right greater than left psoas musculature.  Mild infiltration of the soft tissues of the body wall, likely edema.  Atherosclerotic calcification of the abdominal aorta. No aneurysm. Calcification at the origins of the mesenteric vessels.  IMPRESSION: Subtle stranding adjacent to the descending colon/ sigmoid colon with associated diverticula, favored to represent  early diverticulitis given the patient's history. Once the patient has been treated for this acute process, correlation with colonoscopy is suggested.  Surgical changes of prior partial colectomy. No evidence of obstruction.  Fluid filled colon, can be seen with enteritis/colitis. Recommend correlation with patient presentation.  Large hiatal hernia.  Atherosclerosis with evidence of coronary artery disease.  Additional findings as above.  Signed,  Dulcy Fanny. Earleen Newport, DO  [facility]  [facility]   Electronically Signed   By: Corrie Mckusick D.O.   On: 12/21/2014 12:21   Ct Angio Chest Pe W/cm &/or Wo Cm  01/03/2015   CLINICAL DATA:  Patient with right thigh  pain and generalized edema in the bilateral lower extremities. Patient treated for prior pulmonary embolism.  EXAM: CT ANGIOGRAPHY CHEST WITH CONTRAST  TECHNIQUE: Multidetector CT imaging of the chest was performed using the standard protocol during bolus administration of intravenous contrast. Multiplanar CT image reconstructions and MIPs were obtained to evaluate the vascular anatomy.  CONTRAST:  149mL OMNIPAQUE IOHEXOL 350 MG/ML SOLN  COMPARISON:  CT abdomen pelvis 12/21/2014  FINDINGS: Mediastinum/Nodes: There is a central filling defect within a left lower lobe subsegmental pulmonary artery (image 160; series 12). No additional filling defects are demonstrated within the pulmonary arterial system.  Visualized thyroid is unremarkable. No enlarged axillary, mediastinal or hilar lymphadenopathy. Heart is enlarged. No pericardial effusion. Atherosclerotic plaque of the thoracic aorta. Large hiatal hernia with a majority of the stomach located in the thorax.  Lungs/Pleura: Central airways are patent. Dependent atelectasis within the left greater than right lower lobes. No definite discrete pulmonary nodules are identified. Trace right pleural effusion. No pneumothorax.  Upper abdomen: No acute process within the  visualized upper abdomen.  Musculoskeletal: Thoracic spine degenerative changes. Abnormal appearance of visualized aspect of right glenohumeral joint. Findings are nonspecific may be secondary to extensive degenerative changes. Acute traumatic injury is not excluded.  Review of the MIP images confirms the above findings.  IMPRESSION: 1. Small filling defect within a distal left lower lobe subsegmental pulmonary artery, compatible with pulmonary embolism. 2. Bilateral lower lobe subpleural opacities favored to represent atelectasis. Trace right pleural effusion. 3. Large hiatal hernia. 4. Abnormality of the right glenohumeral joint which is incompletely included on current examination. Findings may be secondary to chronic degenerative changes and intra-articular loose bodies however acute traumatic injury and possible fracture is not excluded. Recommend correlation with dedicated shoulder radiography. Critical Value/emergent results were called by telephone at the time of interpretation on 01/03/2015 at 8:07 pm to Dr. Fredia Sorrow , who verbally acknowledged these results.   Electronically Signed   By: Lovey Newcomer M.D.   On: 01/03/2015 20:08   Nm Gi Blood Loss  12/22/2014   CLINICAL DATA:  74 year old male with a history of diverticular disease and GI bleeding.  EXAM: NUCLEAR MEDICINE GASTROINTESTINAL BLEEDING SCAN  TECHNIQUE: Sequential abdominal images were obtained following intravenous administration of Tc-38m labeled red blood cells.  RADIOPHARMACEUTICALS:  Twenty-six mCi Tc-47m in-vitro labeled red cells.  COMPARISON:  CT 12/21/2014  FINDINGS: Planar imaging of the abdomen and pelvis performed after administered a shin of radial labeled red blood cells.  120 min of imaging was performed after administration of the radiotracer, with normal blood pool activity identified. Urinary bladder identified. Liver and heart identified.  No accumulation of radiotracer outside of the normal blood pool or solid organs to  suggest bleeding.  IMPRESSION: Negative radio labeled red blood cell study for GI bleeding.  Signed,  Dulcy Fanny. Earleen Newport, DO  [facility]  [facility]   Electronically Signed   By: Corrie Mckusick D.O.   On: 12/22/2014 18:38   US Venous Img Lower Bilateral  01/03/2015   CLINICAL DATA:  Bilateral lower extremity pain.  EXAM: BILATERAL LOWER EXTREMITY VENOUS DOPPLER ULTRASOUND  TECHNIQUE: Gray-scale sonography with graded compression, as well as color Doppler and duplex ultrasound were performed to evaluate the lower extremity deep venous systems from the level of the common femoral vein and including the common femoral, femoral, profunda femoral, popliteal and calf veins including the posterior tibial, peroneal and gastrocnemius veins when visible. The superficial great saphenous vein was  also interrogated. Spectral Doppler was utilized to evaluate flow at rest and with distal augmentation maneuvers in the common femoral, femoral and popliteal veins.  COMPARISON:  None.  FINDINGS: RIGHT LOWER EXTREMITY  Common Femoral Vein: Occlusive thrombus.  Saphenofemoral Junction: Occlusive thrombus.  Profunda Femoral Vein: Occlusive thrombus.  Femoral Vein: Occlusive thrombus.  Popliteal Vein: Occlusive thrombus.  Calf Veins: Occlusive thrombus.  Superficial Great Saphenous Vein: No evidence of thrombus. Normal compressibility and flow on color Doppler imaging.  Other findings: There is subcutaneous edema from the right upper thigh to the foot.  LEFT LOWER EXTREMITY  Common Femoral Vein: No evidence of thrombus. Normal compressibility, respiratory phasicity and response to augmentation.  Saphenofemoral Junction: No evidence of thrombus. Normal compressibility and flow on color Doppler imaging.  Profunda Femoral Vein: No evidence of thrombus. Normal compressibility and flow on color Doppler imaging.  Femoral Vein: No evidence of thrombus. Normal compressibility, respiratory phasicity  and response to augmentation.  Popliteal Vein: No evidence of thrombus. Normal compressibility, respiratory phasicity and response to augmentation.  Calf Veins: No evidence of thrombus. Normal compressibility and flow on color Doppler imaging.  Superficial Great Saphenous Vein: No evidence of thrombus. Normal compressibility and flow on color Doppler imaging.  Other Findings:  There is subcutaneous edema in the left calf.  IMPRESSION: 1. Extensive deep venous thrombosis of the right leg. 2. No evidence of venous thrombosis in the left leg.   Electronically Signed   By: Lorriane Shire M.D.   On: 01/03/2015 15:44   Dg Chest Port 1 View  01/03/2015   CLINICAL DATA:  Bilateral lower leg swelling.  EXAM: PORTABLE CHEST - 1 VIEW  COMPARISON:  Chest radiograph 11/15/2014  FINDINGS: Stable cardiomegaly. No consolidative pulmonary opacities. No pleural effusion or pneumothorax. Regional skeleton is unremarkable.  IMPRESSION: Cardiomegaly.  No new acute cardiopulmonary process.   Electronically Signed   By: Lovey Newcomer M.D.   On: 01/03/2015 15:42        Filed Weights   01/03/15 1405 01/03/15 2142  Weight: 133.811 kg (295 lb) 134.4 kg (296 lb 4.8 oz)     Microbiology: No results found for this or any previous visit (from the past 240 hour(s)).     Blood Culture    Component Value Date/Time   SDES URINE, CLEAN CATCH 12/21/2014 0848   SPECREQUEST NONE 12/21/2014 0848   CULT  12/21/2014 0848    Multiple bacterial morphotypes present, none predominant. Suggest appropriate recollection if clinically indicated. Performed at [facility] 12/22/2014 FINAL 12/21/2014 0848      Labs: Results for orders placed or performed during the hospital encounter of 01/03/15 (from the past 48 hour(s))  CBC with Differential/Platelet     Status: Abnormal   Collection Time: 01/03/15  2:47 PM  Result Value Ref Range   WBC 11.4 (H) 4.0 - 10.5 K/uL   RBC 3.02 (L) 4.22 - 5.81 MIL/uL    Hemoglobin 8.6 (L) 13.0 - 17.0 g/dL   HCT 27.3 (L) 39.0 - 52.0 %   MCV 90.4 78.0 - 100.0 fL   MCH 28.5 26.0 - 34.0 pg   MCHC 31.5 30.0 - 36.0 g/dL   RDW 17.2 (H) 11.5 - 15.5 %   Platelets 186 150 - 400 K/uL   Neutrophils Relative % 78 (H) 43 - 77 %   Neutro Abs 8.9 (H) 1.7 - 7.7 K/uL   Lymphocytes Relative 14 12 - 46 %   Lymphs Abs 1.5 0.7 -  4.0 K/uL   Monocytes Relative 7 3 - 12 %   Monocytes Absolute 0.8 0.1 - 1.0 K/uL   Eosinophils Relative 1 0 - 5 %   Eosinophils Absolute 0.1 0.0 - 0.7 K/uL   Basophils Relative 0 0 - 1 %   Basophils Absolute 0.0 0.0 - 0.1 K/uL  Comprehensive metabolic panel     Status: Abnormal   Collection Time: 01/03/15  2:47 PM  Result Value Ref Range   Sodium 142 135 - 145 mmol/L   Potassium 3.6 3.5 - 5.1 mmol/L   Chloride 104 101 - 111 mmol/L   CO2 33 (H) 22 - 32 mmol/L   Glucose, Bld 90 65 - 99 mg/dL   BUN 22 (H) 6 - 20 mg/dL   Creatinine, Ser 8.00 0.61 - 1.24 mg/dL   Calcium 8.8 (L) 8.9 - 10.3 mg/dL   Total Protein 5.4 (L) 6.5 - 8.1 g/dL   Albumin 2.4 (L) 3.5 - 5.0 g/dL   AST 17 15 - 41 U/L   ALT 16 (L) 17 - 63 U/L   Alkaline Phosphatase 37 (L) 38 - 126 U/L   Total Bilirubin 0.5 0.3 - 1.2 mg/dL   GFR calc non Af Amer >60 >60 mL/min   GFR calc Af Amer >60 >60 mL/min    Comment: (NOTE) The eGFR has been calculated using the CKD EPI equation. This calculation has not been validated in all clinical situations. eGFR's persistently <60 mL/min signify possible Chronic Kidney Disease.    Anion gap 5 5 - 15  Protime-INR     Status: Abnormal   Collection Time: 01/03/15  3:57 PM  Result Value Ref Range   Prothrombin Time 34.7 (H) 11.6 - 15.2 seconds   INR 3.54 (H) 0.00 - 1.49  APTT     Status: None   Collection Time: 01/03/15  3:57 PM  Result Value Ref Range   aPTT 36 24 - 37 seconds  Comprehensive metabolic panel     Status: Abnormal   Collection Time: 01/03/15  9:44 PM  Result Value Ref Range   Sodium 139 135 - 145 mmol/L   Potassium 3.8 3.5  - 5.1 mmol/L   Chloride 105 101 - 111 mmol/L   CO2 26 22 - 32 mmol/L   Glucose, Bld 132 (H) 65 - 99 mg/dL   BUN 21 (H) 6 - 20 mg/dL   Creatinine, Ser 1.32 0.61 - 1.24 mg/dL   Calcium 8.7 (L) 8.9 - 10.3 mg/dL   Total Protein 5.9 (L) 6.5 - 8.1 g/dL   Albumin 2.7 (L) 3.5 - 5.0 g/dL   AST 25 15 - 41 U/L   ALT 18 17 - 63 U/L   Alkaline Phosphatase 43 38 - 126 U/L   Total Bilirubin 0.9 0.3 - 1.2 mg/dL   GFR calc non Af Amer >60 >60 mL/min   GFR calc Af Amer >60 >60 mL/min    Comment: (NOTE) The eGFR has been calculated using the CKD EPI equation. This calculation has not been validated in all clinical situations. eGFR's persistently <60 mL/min signify possible Chronic Kidney Disease.    Anion gap 8 5 - 15  CBC     Status: Abnormal   Collection Time: 01/03/15  9:44 PM  Result Value Ref Range   WBC 17.1 (H) 4.0 - 10.5 K/uL   RBC 3.63 (L) 4.22 - 5.81 MIL/uL   Hemoglobin 10.4 (L) 13.0 - 17.0 g/dL   HCT 03.4 (L) 06.1 - 70.6 %  MCV 90.1 78.0 - 100.0 fL   MCH 28.7 26.0 - 34.0 pg   MCHC 31.8 30.0 - 36.0 g/dL   RDW 17.2 (H) 11.5 - 15.5 %   Platelets 192 150 - 400 K/uL  Comprehensive metabolic panel     Status: Abnormal   Collection Time: 01/04/15  6:21 AM  Result Value Ref Range   Sodium 139 135 - 145 mmol/L   Potassium 2.9 (L) 3.5 - 5.1 mmol/L    Comment: DELTA CHECK NOTED RESULT REPEATED AND VERIFIED    Chloride 101 101 - 111 mmol/L   CO2 30 22 - 32 mmol/L   Glucose, Bld 145 (H) 65 - 99 mg/dL   BUN 20 6 - 20 mg/dL   Creatinine, Ser 0.73 0.61 - 1.24 mg/dL   Calcium 8.8 (L) 8.9 - 10.3 mg/dL   Total Protein 5.9 (L) 6.5 - 8.1 g/dL   Albumin 2.6 (L) 3.5 - 5.0 g/dL   AST 19 15 - 41 U/L   ALT 18 17 - 63 U/L   Alkaline Phosphatase 40 38 - 126 U/L   Total Bilirubin 0.6 0.3 - 1.2 mg/dL   GFR calc non Af Amer >60 >60 mL/min   GFR calc Af Amer >60 >60 mL/min    Comment: (NOTE) The eGFR has been calculated using the CKD EPI equation. This calculation has not been validated in all  clinical situations. eGFR's persistently <60 mL/min signify possible Chronic Kidney Disease.    Anion gap 8 5 - 15  CBC     Status: Abnormal   Collection Time: 01/04/15  6:21 AM  Result Value Ref Range   WBC 9.5 4.0 - 10.5 K/uL   RBC 3.29 (L) 4.22 - 5.81 MIL/uL   Hemoglobin 9.3 (L) 13.0 - 17.0 g/dL   HCT 29.3 (L) 39.0 - 52.0 %   MCV 89.1 78.0 - 100.0 fL   MCH 28.3 26.0 - 34.0 pg   MCHC 31.7 30.0 - 36.0 g/dL   RDW 16.9 (H) 11.5 - 15.5 %   Platelets 211 150 - 400 K/uL  Heparin level (unfractionated)     Status: Abnormal   Collection Time: 01/04/15  6:21 AM  Result Value Ref Range   Heparin Unfractionated 1.91 (H) 0.30 - 0.70 IU/mL    Comment:        IF HEPARIN RESULTS ARE BELOW EXPECTED VALUES, AND PATIENT DOSAGE HAS BEEN CONFIRMED, SUGGEST FOLLOW UP TESTING OF ANTITHROMBIN III LEVELS. RESULTS CONFIRMED BY MANUAL DILUTION   Protime-INR     Status: Abnormal   Collection Time: 01/04/15  6:21 AM  Result Value Ref Range   Prothrombin Time 26.7 (H) 11.6 - 15.2 seconds   INR 2.50 (H) 0.00 - 1.49  APTT     Status: Abnormal   Collection Time: 01/04/15  6:21 AM  Result Value Ref Range   aPTT >200 (HH) 24 - 37 seconds    Comment:        IF BASELINE aPTT IS ELEVATED, SUGGEST PATIENT RISK ASSESSMENT BE USED TO DETERMINE APPROPRIATE ANTICOAGULANT THERAPY. RESULT REPEATED AND VERIFIED CRITICAL RESULT CALLED TO, READ BACK BY AND VERIFIED WITH: J.SEIGLA AT 0815 ON 01/04/15 BY S.VANHOORNE   Magnesium     Status: Abnormal   Collection Time: 01/04/15  6:53 AM  Result Value Ref Range   Magnesium 1.3 (L) 1.7 - 2.4 mg/dL  APTT     Status: Abnormal   Collection Time: 01/04/15  3:31 PM  Result Value Ref Range   aPTT 139 (  H) 24 - 37 seconds    Comment:        IF BASELINE aPTT IS ELEVATED, SUGGEST PATIENT RISK ASSESSMENT BE USED TO DETERMINE APPROPRIATE ANTICOAGULANT THERAPY. RESULT REPEATED AND VERIFIED   CBC     Status: Abnormal   Collection Time: 01/05/15  6:38 AM  Result Value  Ref Range   WBC 12.6 (H) 4.0 - 10.5 K/uL   RBC 3.37 (L) 4.22 - 5.81 MIL/uL   Hemoglobin 9.5 (L) 13.0 - 17.0 g/dL   HCT 30.1 (L) 39.0 - 52.0 %   MCV 89.3 78.0 - 100.0 fL   MCH 28.2 26.0 - 34.0 pg   MCHC 31.6 30.0 - 36.0 g/dL   RDW 17.1 (H) 11.5 - 15.5 %   Platelets 225 150 - 400 K/uL  APTT     Status: Abnormal   Collection Time: 01/05/15  6:38 AM  Result Value Ref Range   aPTT 75 (H) 24 - 37 seconds    Comment:        IF BASELINE aPTT IS ELEVATED, SUGGEST PATIENT RISK ASSESSMENT BE USED TO DETERMINE APPROPRIATE ANTICOAGULANT THERAPY.   Comprehensive metabolic panel     Status: Abnormal   Collection Time: 01/05/15  6:38 AM  Result Value Ref Range   Sodium 139 135 - 145 mmol/L   Potassium 3.8 3.5 - 5.1 mmol/L    Comment: DELTA CHECK NOTED   Chloride 101 101 - 111 mmol/L   CO2 31 22 - 32 mmol/L   Glucose, Bld 134 (H) 65 - 99 mg/dL   BUN 23 (H) 6 - 20 mg/dL   Creatinine, Ser 0.79 0.61 - 1.24 mg/dL   Calcium 9.1 8.9 - 10.3 mg/dL   Total Protein 5.9 (L) 6.5 - 8.1 g/dL   Albumin 2.6 (L) 3.5 - 5.0 g/dL   AST 15 15 - 41 U/L   ALT 16 (L) 17 - 63 U/L   Alkaline Phosphatase 39 38 - 126 U/L   Total Bilirubin 0.5 0.3 - 1.2 mg/dL   GFR calc non Af Amer >60 >60 mL/min   GFR calc Af Amer >60 >60 mL/min    Comment: (NOTE) The eGFR has been calculated using the CKD EPI equation. This calculation has not been validated in all clinical situations. eGFR's persistently <60 mL/min signify possible Chronic Kidney Disease.    Anion gap 7 5 - 15     Lipid Panel  No results found for: CHOL, TRIG, HDL, CHOLHDL, VLDL, LDLCALC, LDLDIRECT   No results found for: HGBA1C   Lab Results  Component Value Date   CREATININE 0.79 01/05/2015     HPI :74 year old man who was recently hospitalized with rectal bleeding during which his Xarelto, aspirin, Pletal was discontinued. He started his anticoagulation about 3 days ago. He now presents with swelling of the right leg for the last 24 hours. He  denies any chest pain or dyspnea. There is no fever. The patient has had a history of thromboembolic disease dating back to approximately 2 years ago when he was found to have bilateral PEs, right lower leg DVT and also atrial fibrillation with rapid ventricular response. At this point he was started on anticoagulation. Evaluation in the emergency room today shows that he has a extensive DVT of the right leg involving the common femoral vein, popliteal and calf veins. He did not have any symptoms of pulmonary embolism but due to such a large clot burden, a CT angiogram of the chest was done. This in fact  did show small filling defect in the left lower lobe, consistent with pulmonary embolism. He is now being admitted for further management.   HOSPITAL COURSE:  1. Left lower lobe pulmonary embolism. Initially started on intravenous heparin , transitioned to Xarelto, 15 mg twice a day for 21 days and then 20 mg by mouth daily. There has been no evidence of GI bleeding or recurrence . Discussed with patient's wife by the bedside. The patient probably developed the PE and the DVT while his anticoagulation was on hold in the setting of GI bleeding. Patient is anticipated to discharge homeif not hypoxic and not tachycardic. PCP to closely follow CBC in the outpatient setting 2. Extensive right leg DVT. As above 3. Hypokalemia repleted, magnesium 1.3, patient required aggressive repletion. Started on magnesium oxide twice a day 4. Recent blood loss anemia requiring 4 units of packed red blood cells. Hemoglobin has been stable but slowly different down but stable. 5. Atrial fibrillation. The rate is controlled. Continue Cardizem 6. C. difficile colitis during last admission, patient has completed his course of Flagyl 7. Hypertension. Stable. 8. Recent history of GI bleed. Hemoglobin is 8.6. His previous hemoglobin was 9.8. 9.6 at the time of discharge. Will closely monitor. Continue PPI 9. Dyslipidemia continue  Crestor 10.   Discharge Exam: *  Blood pressure 156/98, pulse 90, temperature 98.4 F (36.9 C), temperature source Oral, resp. rate 20, height $RemoveBe'6\' 2"'[id number]$  (1.88 m), weight 134.4 kg (296 lb 4.8 oz), SpO2 100 %.  General: No acute respiratory distress Lungs: Clear to auscultation bilaterally without wheezes or crackles Cardiovascular: Regular rate and rhythm without murmur gallop or rub normal S1 and S2 Abdomen: Nontender, nondistended, soft, bowel sounds positive, no rebound, no ascites, no appreciable mass Extremities: No significant cyanosis, clubbing, or edema bilateral lower extremities        Discharge Instructions    Diet - low sodium heart healthy    Complete by:  As directed      Increase activity slowly    Complete by:  As directed              Signed: Ellsworth Waldschmidt 01/05/2015, 8:43 AM        Time spent >45 mins

## 2015-01-05 NOTE — Progress Notes (Signed)
Discharge instructions reviewed with pt and wife. IV and tele removed. No current questions or concerns. Informed of when they need to call the MD or come back to the hospital. Awaiting EMS transportation

## 2015-01-07 ENCOUNTER — Other Ambulatory Visit: Payer: Self-pay | Admitting: *Deleted

## 2015-01-07 ENCOUNTER — Telehealth: Payer: Self-pay | Admitting: *Deleted

## 2015-01-07 MED ORDER — NITROGLYCERIN 0.4 MG SL SUBL: 0.4000 mg | SUBLINGUAL_TABLET | SUBLINGUAL | Status: AC | PRN

## 2015-01-07 NOTE — Telephone Encounter (Signed)
Patient was taken off aspirin 81 mg at his first admission to the hospital for GI bleeding. Patient developed a blood clot after stopping the aspiring and was discharged from hospital after second admission on xarelto 15 mg twice daily and was informed to consult with his cardiologist about whether or not he should go back on the aspirin 81 mg. Please advise if patient should or should not restart aspirin 81 mg daily in addition to xarelto 15 mg twice daily.

## 2015-01-08 NOTE — Telephone Encounter (Signed)
Wife informed and verbalized understanding of plan. 

## 2015-01-08 NOTE — Telephone Encounter (Signed)
With the patient on Xarelto, he should not be on aspirin.

## 2015-03-12 ENCOUNTER — Other Ambulatory Visit (INDEPENDENT_AMBULATORY_CARE_PROVIDER_SITE_OTHER): Payer: Self-pay | Admitting: *Deleted

## 2015-03-12 ENCOUNTER — Encounter (INDEPENDENT_AMBULATORY_CARE_PROVIDER_SITE_OTHER): Payer: Self-pay | Admitting: *Deleted

## 2015-03-12 DIAGNOSIS — D508 Other iron deficiency anemias: Secondary | ICD-10-CM

## 2015-04-03 ENCOUNTER — Ambulatory Visit (INDEPENDENT_AMBULATORY_CARE_PROVIDER_SITE_OTHER): Payer: Medicare Other | Admitting: Internal Medicine

## 2015-07-08 DIAGNOSIS — I129 Hypertensive chronic kidney disease with stage 1 through stage 4 chronic kidney disease, or unspecified chronic kidney disease: Secondary | ICD-10-CM | POA: Diagnosis not present

## 2015-07-08 DIAGNOSIS — Z96653 Presence of artificial knee joint, bilateral: Secondary | ICD-10-CM | POA: Diagnosis not present

## 2015-07-08 DIAGNOSIS — Z7982 Long term (current) use of aspirin: Secondary | ICD-10-CM | POA: Diagnosis not present

## 2015-07-08 DIAGNOSIS — N401 Enlarged prostate with lower urinary tract symptoms: Secondary | ICD-10-CM | POA: Diagnosis not present

## 2015-07-08 DIAGNOSIS — N39498 Other specified urinary incontinence: Secondary | ICD-10-CM | POA: Diagnosis not present

## 2015-07-08 DIAGNOSIS — N189 Chronic kidney disease, unspecified: Secondary | ICD-10-CM | POA: Diagnosis not present

## 2015-07-08 DIAGNOSIS — N390 Urinary tract infection, site not specified: Secondary | ICD-10-CM | POA: Diagnosis not present

## 2015-07-08 DIAGNOSIS — Z7901 Long term (current) use of anticoagulants: Secondary | ICD-10-CM | POA: Diagnosis not present

## 2015-07-08 DIAGNOSIS — Z86711 Personal history of pulmonary embolism: Secondary | ICD-10-CM | POA: Diagnosis not present

## 2015-07-08 DIAGNOSIS — Z792 Long term (current) use of antibiotics: Secondary | ICD-10-CM | POA: Diagnosis not present

## 2015-07-08 DIAGNOSIS — M109 Gout, unspecified: Secondary | ICD-10-CM | POA: Diagnosis not present

## 2015-07-08 DIAGNOSIS — F4310 Post-traumatic stress disorder, unspecified: Secondary | ICD-10-CM | POA: Diagnosis not present

## 2015-07-08 DIAGNOSIS — I2510 Atherosclerotic heart disease of native coronary artery without angina pectoris: Secondary | ICD-10-CM | POA: Diagnosis not present

## 2015-07-08 DIAGNOSIS — K5790 Diverticulosis of intestine, part unspecified, without perforation or abscess without bleeding: Secondary | ICD-10-CM | POA: Diagnosis not present

## 2015-07-08 DIAGNOSIS — I4891 Unspecified atrial fibrillation: Secondary | ICD-10-CM | POA: Diagnosis not present

## 2015-07-08 DIAGNOSIS — Z96641 Presence of right artificial hip joint: Secondary | ICD-10-CM | POA: Diagnosis not present

## 2015-07-08 DIAGNOSIS — Z993 Dependence on wheelchair: Secondary | ICD-10-CM | POA: Diagnosis not present

## 2015-07-08 DIAGNOSIS — M1991 Primary osteoarthritis, unspecified site: Secondary | ICD-10-CM | POA: Diagnosis not present

## 2015-07-08 DIAGNOSIS — I4892 Unspecified atrial flutter: Secondary | ICD-10-CM | POA: Diagnosis not present

## 2015-07-08 DIAGNOSIS — J449 Chronic obstructive pulmonary disease, unspecified: Secondary | ICD-10-CM | POA: Diagnosis not present

## 2015-07-23 DIAGNOSIS — I129 Hypertensive chronic kidney disease with stage 1 through stage 4 chronic kidney disease, or unspecified chronic kidney disease: Secondary | ICD-10-CM | POA: Diagnosis not present

## 2015-07-23 DIAGNOSIS — I4891 Unspecified atrial fibrillation: Secondary | ICD-10-CM | POA: Diagnosis not present

## 2015-07-23 DIAGNOSIS — J449 Chronic obstructive pulmonary disease, unspecified: Secondary | ICD-10-CM | POA: Diagnosis not present

## 2015-07-23 DIAGNOSIS — N189 Chronic kidney disease, unspecified: Secondary | ICD-10-CM | POA: Diagnosis not present

## 2015-07-23 DIAGNOSIS — I2510 Atherosclerotic heart disease of native coronary artery without angina pectoris: Secondary | ICD-10-CM | POA: Diagnosis not present

## 2015-07-23 DIAGNOSIS — N390 Urinary tract infection, site not specified: Secondary | ICD-10-CM | POA: Diagnosis not present

## 2015-07-24 DIAGNOSIS — I129 Hypertensive chronic kidney disease with stage 1 through stage 4 chronic kidney disease, or unspecified chronic kidney disease: Secondary | ICD-10-CM | POA: Diagnosis not present

## 2015-07-24 DIAGNOSIS — J449 Chronic obstructive pulmonary disease, unspecified: Secondary | ICD-10-CM | POA: Diagnosis not present

## 2015-07-24 DIAGNOSIS — I2510 Atherosclerotic heart disease of native coronary artery without angina pectoris: Secondary | ICD-10-CM | POA: Diagnosis not present

## 2015-07-24 DIAGNOSIS — N189 Chronic kidney disease, unspecified: Secondary | ICD-10-CM | POA: Diagnosis not present

## 2015-07-24 DIAGNOSIS — N390 Urinary tract infection, site not specified: Secondary | ICD-10-CM | POA: Diagnosis not present

## 2015-07-24 DIAGNOSIS — I4891 Unspecified atrial fibrillation: Secondary | ICD-10-CM | POA: Diagnosis not present

## 2015-07-29 ENCOUNTER — Inpatient Hospital Stay (HOSPITAL_COMMUNITY)
Admission: EM | Admit: 2015-07-29 | Discharge: 2015-07-31 | DRG: 092 | Disposition: A | Discharge status: Home Health | Payer: Medicare Other | Attending: Internal Medicine | Admitting: Internal Medicine

## 2015-07-29 ENCOUNTER — Encounter (HOSPITAL_COMMUNITY): Payer: Self-pay

## 2015-07-29 ENCOUNTER — Emergency Department (HOSPITAL_COMMUNITY): Payer: Medicare Other

## 2015-07-29 DIAGNOSIS — Z87891 Personal history of nicotine dependence: Secondary | ICD-10-CM

## 2015-07-29 DIAGNOSIS — Z9849 Cataract extraction status, unspecified eye: Secondary | ICD-10-CM

## 2015-07-29 DIAGNOSIS — N401 Enlarged prostate with lower urinary tract symptoms: Secondary | ICD-10-CM | POA: Diagnosis present

## 2015-07-29 DIAGNOSIS — R410 Disorientation, unspecified: Secondary | ICD-10-CM | POA: Diagnosis not present

## 2015-07-29 DIAGNOSIS — M25519 Pain in unspecified shoulder: Secondary | ICD-10-CM

## 2015-07-29 DIAGNOSIS — E876 Hypokalemia: Secondary | ICD-10-CM

## 2015-07-29 DIAGNOSIS — M1990 Unspecified osteoarthritis, unspecified site: Secondary | ICD-10-CM | POA: Diagnosis present

## 2015-07-29 DIAGNOSIS — N390 Urinary tract infection, site not specified: Secondary | ICD-10-CM | POA: Diagnosis present

## 2015-07-29 DIAGNOSIS — I4891 Unspecified atrial fibrillation: Secondary | ICD-10-CM | POA: Diagnosis not present

## 2015-07-29 DIAGNOSIS — I82401 Acute embolism and thrombosis of unspecified deep veins of right lower extremity: Secondary | ICD-10-CM | POA: Diagnosis present

## 2015-07-29 DIAGNOSIS — T404X5A Adverse effect of other synthetic narcotics, initial encounter: Secondary | ICD-10-CM | POA: Diagnosis present

## 2015-07-29 DIAGNOSIS — I10 Essential (primary) hypertension: Secondary | ICD-10-CM | POA: Diagnosis present

## 2015-07-29 DIAGNOSIS — M810 Age-related osteoporosis without current pathological fracture: Secondary | ICD-10-CM | POA: Diagnosis present

## 2015-07-29 DIAGNOSIS — Z86718 Personal history of other venous thrombosis and embolism: Secondary | ICD-10-CM

## 2015-07-29 DIAGNOSIS — R4182 Altered mental status, unspecified: Secondary | ICD-10-CM

## 2015-07-29 DIAGNOSIS — R531 Weakness: Secondary | ICD-10-CM | POA: Diagnosis not present

## 2015-07-29 DIAGNOSIS — F4310 Post-traumatic stress disorder, unspecified: Secondary | ICD-10-CM | POA: Diagnosis present

## 2015-07-29 DIAGNOSIS — G92 Toxic encephalopathy: Principal | ICD-10-CM | POA: Diagnosis present

## 2015-07-29 DIAGNOSIS — I129 Hypertensive chronic kidney disease with stage 1 through stage 4 chronic kidney disease, or unspecified chronic kidney disease: Secondary | ICD-10-CM | POA: Diagnosis present

## 2015-07-29 DIAGNOSIS — I2699 Other pulmonary embolism without acute cor pulmonale: Secondary | ICD-10-CM | POA: Diagnosis present

## 2015-07-29 DIAGNOSIS — Z955 Presence of coronary angioplasty implant and graft: Secondary | ICD-10-CM

## 2015-07-29 DIAGNOSIS — N189 Chronic kidney disease, unspecified: Secondary | ICD-10-CM | POA: Diagnosis present

## 2015-07-29 DIAGNOSIS — F329 Major depressive disorder, single episode, unspecified: Secondary | ICD-10-CM | POA: Diagnosis present

## 2015-07-29 DIAGNOSIS — M109 Gout, unspecified: Secondary | ICD-10-CM | POA: Diagnosis present

## 2015-07-29 DIAGNOSIS — K219 Gastro-esophageal reflux disease without esophagitis: Secondary | ICD-10-CM | POA: Diagnosis present

## 2015-07-29 DIAGNOSIS — E785 Hyperlipidemia, unspecified: Secondary | ICD-10-CM | POA: Diagnosis present

## 2015-07-29 DIAGNOSIS — Z96653 Presence of artificial knee joint, bilateral: Secondary | ICD-10-CM | POA: Diagnosis present

## 2015-07-29 DIAGNOSIS — G9340 Encephalopathy, unspecified: Secondary | ICD-10-CM | POA: Diagnosis present

## 2015-07-29 DIAGNOSIS — I739 Peripheral vascular disease, unspecified: Secondary | ICD-10-CM

## 2015-07-29 DIAGNOSIS — G894 Chronic pain syndrome: Secondary | ICD-10-CM | POA: Diagnosis present

## 2015-07-29 DIAGNOSIS — B9689 Other specified bacterial agents as the cause of diseases classified elsewhere: Secondary | ICD-10-CM | POA: Diagnosis present

## 2015-07-29 DIAGNOSIS — Z79899 Other long term (current) drug therapy: Secondary | ICD-10-CM

## 2015-07-29 DIAGNOSIS — Z6838 Body mass index (BMI) 38.0-38.9, adult: Secondary | ICD-10-CM

## 2015-07-29 DIAGNOSIS — Z96641 Presence of right artificial hip joint: Secondary | ICD-10-CM | POA: Diagnosis present

## 2015-07-29 DIAGNOSIS — J449 Chronic obstructive pulmonary disease, unspecified: Secondary | ICD-10-CM | POA: Diagnosis present

## 2015-07-29 DIAGNOSIS — Z86711 Personal history of pulmonary embolism: Secondary | ICD-10-CM

## 2015-07-29 DIAGNOSIS — I2510 Atherosclerotic heart disease of native coronary artery without angina pectoris: Secondary | ICD-10-CM | POA: Diagnosis present

## 2015-07-29 DIAGNOSIS — Z7901 Long term (current) use of anticoagulants: Secondary | ICD-10-CM

## 2015-07-29 DIAGNOSIS — I4892 Unspecified atrial flutter: Secondary | ICD-10-CM | POA: Diagnosis present

## 2015-07-29 HISTORY — DX: Other chronic pain: G89.29

## 2015-07-29 LAB — CBC WITH DIFFERENTIAL/PLATELET
Basophils Absolute: 0 10*3/uL (ref 0.0–0.1)
Basophils Relative: 0 %
Eosinophils Absolute: 0.2 10*3/uL (ref 0.0–0.7)
Eosinophils Relative: 3 %
HEMATOCRIT: 42.1 % (ref 39.0–52.0)
HEMOGLOBIN: 13.7 g/dL (ref 13.0–17.0)
LYMPHS ABS: 1.3 10*3/uL (ref 0.7–4.0)
LYMPHS PCT: 19 %
MCH: 30.6 pg (ref 26.0–34.0)
MCHC: 32.5 g/dL (ref 30.0–36.0)
MCV: 94.2 fL (ref 78.0–100.0)
Monocytes Absolute: 0.6 10*3/uL (ref 0.1–1.0)
Monocytes Relative: 9 %
NEUTROS PCT: 69 %
Neutro Abs: 4.8 10*3/uL (ref 1.7–7.7)
Platelets: 149 10*3/uL — ABNORMAL LOW (ref 150–400)
RBC: 4.47 MIL/uL (ref 4.22–5.81)
RDW: 14.4 % (ref 11.5–15.5)
WBC: 7 10*3/uL (ref 4.0–10.5)

## 2015-07-29 LAB — URINALYSIS, ROUTINE W REFLEX MICROSCOPIC
BILIRUBIN URINE: NEGATIVE
Glucose, UA: NEGATIVE mg/dL
KETONES UR: NEGATIVE mg/dL
NITRITE: NEGATIVE
Protein, ur: NEGATIVE mg/dL
SPECIFIC GRAVITY, URINE: 1.015 (ref 1.005–1.030)
pH: 7.5 (ref 5.0–8.0)

## 2015-07-29 LAB — COMPREHENSIVE METABOLIC PANEL
ALT: 19 U/L (ref 17–63)
ANION GAP: 8 (ref 5–15)
AST: 21 U/L (ref 15–41)
Albumin: 3.2 g/dL — ABNORMAL LOW (ref 3.5–5.0)
Alkaline Phosphatase: 60 U/L (ref 38–126)
BUN: 11 mg/dL (ref 6–20)
CO2: 31 mmol/L (ref 22–32)
Calcium: 9.8 mg/dL (ref 8.9–10.3)
Chloride: 102 mmol/L (ref 101–111)
Creatinine, Ser: 0.78 mg/dL (ref 0.61–1.24)
GFR calc Af Amer: >60 mL/min (ref >60)
GFR calc non Af Amer: >60 mL/min (ref >60)
Glucose, Bld: 111 mg/dL — ABNORMAL HIGH (ref 65–99)
Potassium: 3.2 mmol/L — ABNORMAL LOW (ref 3.5–5.1)
SODIUM: 141 mmol/L (ref 135–145)
Total Bilirubin: 0.7 mg/dL (ref 0.3–1.2)
Total Protein: 6.3 g/dL — ABNORMAL LOW (ref 6.5–8.1)

## 2015-07-29 LAB — URINE MICROSCOPIC-ADD ON

## 2015-07-29 LAB — VITAMIN B12: VITAMIN B 12: 409 pg/mL (ref 180–914)

## 2015-07-29 LAB — TROPONIN I: Troponin I: <0.03 ng/mL (ref <0.031)

## 2015-07-29 LAB — LACTIC ACID, PLASMA
LACTIC ACID, VENOUS: 1.2 mmol/L (ref 0.5–2.0)
Lactic Acid, Venous: 1.4 mmol/L (ref 0.5–2.0)

## 2015-07-29 LAB — AMMONIA: Ammonia: 18 umol/L (ref 9–35)

## 2015-07-29 LAB — TSH: TSH: 0.817 u[IU]/mL (ref 0.350–4.500)

## 2015-07-29 MED ORDER — METOPROLOL TARTRATE 50 MG PO TABS
100.0000 mg | ORAL_TABLET | Freq: Two times a day (BID) | ORAL | Status: DC
  Administered 2015-07-29 – 2015-07-31 (×4): 100 mg via ORAL
  Filled 2015-07-29 (×5): qty 2

## 2015-07-29 MED ORDER — FERROUS SULFATE 27 MG PO TABS
1.0000 | ORAL_TABLET | Freq: Every day | ORAL | Status: DC
Start: 2015-07-29 — End: 2015-07-29

## 2015-07-29 MED ORDER — DILTIAZEM HCL ER COATED BEADS 120 MG PO CP24
120.0000 mg | ORAL_CAPSULE | Freq: Every day | ORAL | Status: DC
  Administered 2015-07-29 – 2015-07-31 (×3): 120 mg via ORAL
  Filled 2015-07-29 (×3): qty 1

## 2015-07-29 MED ORDER — TAMSULOSIN HCL 0.4 MG PO CAPS
0.4000 mg | ORAL_CAPSULE | Freq: Every day | ORAL | Status: DC
  Administered 2015-07-29 – 2015-07-31 (×3): 0.4 mg via ORAL
  Filled 2015-07-29 (×3): qty 1

## 2015-07-29 MED ORDER — CETYLPYRIDINIUM CHLORIDE 0.05 % MT LIQD
7.0000 mL | Freq: Two times a day (BID) | OROMUCOSAL | Status: DC
  Administered 2015-07-29 – 2015-07-31 (×4): 7 mL via OROMUCOSAL

## 2015-07-29 MED ORDER — FINASTERIDE 5 MG PO TABS
ORAL_TABLET | ORAL | Status: AC
  Filled 2015-07-29: qty 1

## 2015-07-29 MED ORDER — FENTANYL 50 MCG/HR TD PT72
50.0000 ug | MEDICATED_PATCH | TRANSDERMAL | Status: DC
  Administered 2015-07-29: 50 ug via TRANSDERMAL
  Filled 2015-07-29: qty 1

## 2015-07-29 MED ORDER — FISH OIL 1000 MG PO CAPS: 2.0000 | ORAL_CAPSULE | Freq: Every day | ORAL | Status: DC

## 2015-07-29 MED ORDER — FINASTERIDE 5 MG PO TABS
5.0000 mg | ORAL_TABLET | Freq: Every day | ORAL | Status: DC
  Administered 2015-07-29 – 2015-07-31 (×3): 5 mg via ORAL
  Filled 2015-07-29 (×4): qty 1

## 2015-07-29 MED ORDER — BISACODYL 5 MG PO TBEC: 5.0000 mg | DELAYED_RELEASE_TABLET | Freq: Every day | ORAL | Status: DC | PRN

## 2015-07-29 MED ORDER — SODIUM CHLORIDE 0.9 % IV SOLN: INTRAVENOUS | Status: AC

## 2015-07-29 MED ORDER — POTASSIUM CHLORIDE CRYS ER 20 MEQ PO TBCR
40.0000 meq | EXTENDED_RELEASE_TABLET | Freq: Once | ORAL | Status: AC
  Administered 2015-07-29: 40 meq via ORAL
  Filled 2015-07-29: qty 2

## 2015-07-29 MED ORDER — PANTOPRAZOLE SODIUM 40 MG PO TBEC
40.0000 mg | DELAYED_RELEASE_TABLET | Freq: Every day | ORAL | Status: DC
  Administered 2015-07-29 – 2015-07-31 (×3): 40 mg via ORAL
  Filled 2015-07-29 (×3): qty 1

## 2015-07-29 MED ORDER — DOCUSATE SODIUM 100 MG PO CAPS
100.0000 mg | ORAL_CAPSULE | Freq: Two times a day (BID) | ORAL | Status: DC
  Administered 2015-07-29 – 2015-07-31 (×4): 100 mg via ORAL
  Filled 2015-07-29 (×5): qty 1

## 2015-07-29 MED ORDER — RIVAROXABAN 20 MG PO TABS
20.0000 mg | ORAL_TABLET | Freq: Every day | ORAL | Status: DC
  Administered 2015-07-29 – 2015-07-31 (×3): 20 mg via ORAL
  Filled 2015-07-29 (×3): qty 1

## 2015-07-29 MED ORDER — DICLOFENAC SODIUM 1 % TD GEL
1.0000 "application " | Freq: Every day | TRANSDERMAL | Status: DC
  Administered 2015-07-30 – 2015-07-31 (×2): 1 via TOPICAL
  Filled 2015-07-29: qty 100

## 2015-07-29 MED ORDER — NITROGLYCERIN 0.4 MG SL SUBL: 0.4000 mg | SUBLINGUAL_TABLET | SUBLINGUAL | Status: DC | PRN

## 2015-07-29 MED ORDER — OMEGA-3-ACID ETHYL ESTERS 1 G PO CAPS
1.0000 g | ORAL_CAPSULE | Freq: Every day | ORAL | Status: DC
  Administered 2015-07-29 – 2015-07-31 (×3): 1 g via ORAL
  Filled 2015-07-29 (×3): qty 1

## 2015-07-29 MED ORDER — VITAMIN C 500 MG PO TABS
500.0000 mg | ORAL_TABLET | Freq: Every day | ORAL | Status: DC
  Administered 2015-07-29 – 2015-07-31 (×3): 500 mg via ORAL
  Filled 2015-07-29 (×3): qty 1

## 2015-07-29 MED ORDER — FERROUS SULFATE 325 (65 FE) MG PO TABS
325.0000 mg | ORAL_TABLET | Freq: Every day | ORAL | Status: DC
  Administered 2015-07-30 – 2015-07-31 (×2): 325 mg via ORAL
  Filled 2015-07-29: qty 1

## 2015-07-29 MED ORDER — ALUM & MAG HYDROXIDE-SIMETH 200-200-20 MG/5ML PO SUSP: 15.0000 mL | ORAL | Status: DC | PRN

## 2015-07-29 MED ORDER — POLYETHYLENE GLYCOL 3350 17 G PO PACK: 17.0000 g | PACK | Freq: Every day | ORAL | Status: DC | PRN

## 2015-07-29 MED ORDER — ONE-DAILY MULTI VITAMINS PO TABS: 1.0000 | ORAL_TABLET | Freq: Every day | ORAL | Status: DC

## 2015-07-29 MED ORDER — ALLOPURINOL 100 MG PO TABS
100.0000 mg | ORAL_TABLET | Freq: Every day | ORAL | Status: DC
  Administered 2015-07-29 – 2015-07-31 (×3): 100 mg via ORAL
  Filled 2015-07-29 (×3): qty 1

## 2015-07-29 MED ORDER — HYDROCODONE-ACETAMINOPHEN 5-325 MG PO TABS
1.0000 | ORAL_TABLET | Freq: Four times a day (QID) | ORAL | Status: DC | PRN
  Administered 2015-07-30 – 2015-07-31 (×3): 1 via ORAL
  Filled 2015-07-29 (×3): qty 1

## 2015-07-29 MED ORDER — MAGNESIUM OXIDE 400 (241.3 MG) MG PO TABS
400.0000 mg | ORAL_TABLET | Freq: Two times a day (BID) | ORAL | Status: DC
  Administered 2015-07-29 – 2015-07-31 (×4): 400 mg via ORAL
  Filled 2015-07-29 (×5): qty 1

## 2015-07-29 MED ORDER — FENOFIBRATE 160 MG PO TABS
160.0000 mg | ORAL_TABLET | Freq: Every day | ORAL | Status: DC
  Administered 2015-07-29 – 2015-07-31 (×3): 160 mg via ORAL
  Filled 2015-07-29 (×3): qty 1

## 2015-07-29 MED ORDER — CIPROFLOXACIN IN D5W 400 MG/200ML IV SOLN
400.0000 mg | Freq: Two times a day (BID) | INTRAVENOUS | Status: DC
  Administered 2015-07-30 – 2015-07-31 (×3): 400 mg via INTRAVENOUS
  Filled 2015-07-29 (×3): qty 200

## 2015-07-29 MED ORDER — SODIUM CHLORIDE 0.9 % IJ SOLN
3.0000 mL | Freq: Two times a day (BID) | INTRAMUSCULAR | Status: DC
  Administered 2015-07-30: 3 mL via INTRAVENOUS

## 2015-07-29 MED ORDER — CILOSTAZOL 100 MG PO TABS
ORAL_TABLET | ORAL | Status: AC
  Filled 2015-07-29: qty 1

## 2015-07-29 MED ORDER — POTASSIUM CHLORIDE CRYS ER 20 MEQ PO TBCR
40.0000 meq | EXTENDED_RELEASE_TABLET | Freq: Every day | ORAL | Status: DC
  Administered 2015-07-29 – 2015-07-31 (×3): 40 meq via ORAL
  Filled 2015-07-29 (×3): qty 2

## 2015-07-29 MED ORDER — CALCIUM CARBONATE-VITAMIN D 500-200 MG-UNIT PO TABS
1.0000 | ORAL_TABLET | Freq: Every day | ORAL | Status: DC
  Administered 2015-07-29 – 2015-07-31 (×3): 1 via ORAL
  Filled 2015-07-29 (×3): qty 1

## 2015-07-29 MED ORDER — CILOSTAZOL 100 MG PO TABS
100.0000 mg | ORAL_TABLET | Freq: Every day | ORAL | Status: DC
  Administered 2015-07-29 – 2015-07-31 (×3): 100 mg via ORAL
  Filled 2015-07-29 (×4): qty 1

## 2015-07-29 MED ORDER — ATORVASTATIN CALCIUM 40 MG PO TABS
80.0000 mg | ORAL_TABLET | Freq: Every day | ORAL | Status: DC
  Administered 2015-07-29 – 2015-07-31 (×3): 80 mg via ORAL
  Filled 2015-07-29 (×3): qty 2

## 2015-07-29 MED ORDER — DICLOFENAC SODIUM 1 % TD GEL
TRANSDERMAL | Status: AC
  Filled 2015-07-29: qty 100

## 2015-07-29 MED ORDER — CIPROFLOXACIN IN D5W 400 MG/200ML IV SOLN
400.0000 mg | Freq: Once | INTRAVENOUS | Status: AC
  Administered 2015-07-29: 400 mg via INTRAVENOUS
  Filled 2015-07-29: qty 200

## 2015-07-29 MED ORDER — SODIUM CHLORIDE 0.9 % IV SOLN
INTRAVENOUS | Status: DC
  Administered 2015-07-29: 15:00:00 via INTRAVENOUS

## 2015-07-29 NOTE — H&P (Signed)
Patient Demographics  Bryce Berry, is a 74 y.o. male  MRN: [id number]   DOB - 07-24-1941  Admit Date - 07/29/2015  Outpatient Primary MD for the patient is VYAS,DHRUV B., MD   With History of -  Past Medical History  Diagnosis Date  . Atrial fibrillation ([place])     Postop hip surgery, January 2012, digoxin and diltiazem, reverted to sinus in the hospital  . Osteoarthritis   . COPD (chronic obstructive pulmonary disease) (HCC)     Emphysema  . Depression   . Posttraumatic stress disorder   . GERD (gastroesophageal reflux disease)   . Irritable bowel   . BPH (benign prostatic hyperplasia)   . Urinary tract bacterial infections   . Osteoporosis   . Hypertension   . Dyslipidemia   . CAD (coronary artery disease)     Stents placed in [place] and [place] in the past  /Myoview 2008, no ischemia, /   . Atrial flutter ([place])     History of the past with syncope and  ?? H. and consideration, no records  . 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 Hip pain   . Shoulder pain   . Gout   . Peripheral arterial disease ([place])     Dopplers Dr.Vyas office October, 2010, apparent occlusion anterior tibial arteries bilaterally., findings suggest hemodynamically significant stenosis of the proximal right provisional femoral artery and the mid left superficial artery.  Ankle-brachial indices indicate moderate stenosis within both lower extremities  . Chronic kidney disease     Creatinine 2.1, October 13, 2010--> 0.74 10/2012  . Hyperlipidemia     Mixed  . GI bleed   . Ejection fraction     EF 50-55%, echo, May, 2013, severe inferior hypokinesis  . Bilateral pulmonary embolism ([place])     a. 10/2012 - V:Q scan high prob for Bilat PE in setting of dyspnea/hemoptysis-->anticoagulation started.  . Right leg DVT ([place])     a. 10/2012 - subocclusive DVT R popliteal vein.  Marland Kitchen COPD (chronic obstructive pulmonary disease) ([place])   . Chronic pain     bilat hips, right shoulder, RLE       Past Surgical History  Procedure Laterality Date  . Knee arthroplasty      Right  . Total knee arthroplasty      Left  . Total hip arthroplasty      Right-complicated hx  . Colon resection    . Back surgery      [place]  . Prostate surgery    . Colonoscopy N/A 03/13/2013    Procedure: COLONOSCOPY;  Surgeon: Rogene Houston, MD;  [facility];  Service: Endoscopy;  Laterality: N/A;  730  . Cataract extraction    . Colonoscopy N/A 12/22/2014    Procedure: COLONOSCOPY;  Surgeon: Rogene Houston, MD;  [facility]: [facility];  Service: Endoscopy;  Laterality: N/A;  . Givens capsule study N/A 12/24/2014    Procedure: GIVENS CAPSULE STUDY;  Surgeon: Rogene Houston, MD;  Location: [facility];  Service: Endoscopy;  Laterality: N/A;  . Coronary angioplasty with stent placement      x 4    in for   Chief Complaint  Patient presents with  . Altered Mental Status     HPI  Bryce Berry  is a 74 y.o. male,with history of chronic anticoagulation with xarelto secondary to chronic a-fib/a-flutter and bilateral PE, CAD s/p 4 stents, diverticulosis s/p partial sigmoidectomy ,internal  hemorrhoids, COPD, gout, recurrent UTI who presents with generalized weakness and altered mental status over the last 3 weeks, bedside gives history, patient was recently hospitalized twice at [facility] over last 3 weeks for similar complaints, was told he was diagnosed with UTI, treated with IV antibiotics, as not recall the name of antibiotic or has any discharge paperwork with her currently, reports he had extensive workup, but can't recall what type of test exactly, NAD workup was significant for positive urinalysis, and mild hypokalemia, otherwise no significant labs abnormalities, she denies any chest pain, shortness of breath, fever, chills, coffee-ground emesis, diarrhea, no focal deficits, tingling or numbness, aphasia or facial droop , patient had chronic right-sided weakness  secondary to pain and right shoulder and right knee ,patient was started on IV Cipro in ED , I was called to admit.  Review of Systems    In addition to the HPI above,  No Fever-chills, No Headache, No changes with Vision or hearing, No problems swallowing food or Liquids, No Chest pain, Cough or Shortness of Breath, No Abdominal pain, No Nausea or Vommitting, Bowel movements are regular, No Blood in stool or Urine, No dysuria, No new skin rashes or bruises, Planes of chronic pain in multiple joints No new weakness, tingling, numbness in any extremity, No recent weight gain or loss, No polyuria, polydypsia or polyphagia, No significant Mental Stressors.  A full 10 point Review of Systems was done, except as stated above, all other Review of Systems were negative.   Social History Social History  Substance Use Topics  . Smoking status: Former Smoker -- 1.00 packs/day for 30 years    Types: Cigarettes    Quit date: 08/16/2006  . Smokeless tobacco: Never Used     Comment: Patient smoked a pack a day for about 30 years  . Alcohol Use: No     Family History Family History  Problem Relation Age of Onset  . Diabetes Daughter   . Hypertension Daughter   . Obesity Daughter      Prior to Admission medications   Medication Sig Start Date End Date Taking? Authorizing Provider  allopurinol (ZYLOPRIM) 100 MG tablet Take 100 mg by mouth daily.    Yes Historical Provider, MD  alum & mag hydroxide-simeth (MYLANTA) 200-200-20 MG/5ML suspension Take 15 mLs by mouth as needed for indigestion.    Yes Historical Provider, MD  ascorbic acid (VITAMIN C) 500 MG tablet Take 500 mg by mouth daily.    Yes Historical Provider, MD  atorvastatin (LIPITOR) 80 MG tablet Take 80 mg by mouth daily.   Yes Historical Provider, MD  calcium-vitamin D (OSCAL WITH D) 500-200 MG-UNIT per tablet Take 1 tablet by mouth daily.    Yes Historical Provider, MD  cilostazol (PLETAL) 100 MG tablet Take 1 tablet by  mouth daily. 07/07/15  Yes Historical Provider, MD  COLCRYS 0.6 MG tablet Take 1 tablet by mouth daily as needed. 07/07/15  Yes Historical Provider, MD  diclofenac sodium (VOLTAREN) 1 % GEL Apply 1 application topically daily. 05/20/15  Yes Historical Provider, MD  diltiazem (CARDIZEM CD) 120 MG 24 hr capsule Take 1 capsule (120 mg total) by mouth daily. As directed based off blood pressure readings 12/25/14  Yes Kelvin Cellar, MD  fenofibrate (TRICOR) 145 MG tablet Take 1 tablet (145 mg total) by mouth daily. 08/16/11  Yes Ezra Sites, MD  fentaNYL (DURAGESIC - DOSED MCG/HR) 100 MCG/HR Place 1 patch onto the skin every 3 (three) days. 11/02/14  Yes Historical Provider, MD  fentaNYL (DURAGESIC - DOSED MCG/HR) 25 MCG/HR patch Place 25 mcg onto the skin every 3 (three) days.   Yes Historical Provider, MD  Ferrous Sulfate (RA IRON) 27 MG TABS Take 1 tablet by mouth daily.     Yes Historical Provider, MD  finasteride (PROSCAR) 5 MG tablet Take 5 mg by mouth daily.     Yes Historical Provider, MD  furosemide (LASIX) 20 MG tablet Take 2 tablets (40 mg total) by mouth daily. Patient taking differently: Take 40-80 mg by mouth daily.  11/02/13  Yes Carlena Bjornstad, MD  HYDROcodone-acetaminophen (NORCO/VICODIN) 5-325 MG per tablet Take 1-2 tablets by mouth every 6 (six) hours as needed for moderate pain or severe pain.  11/18/14  Yes Historical Provider, MD  magnesium oxide (MAG-OX) 400 (241.3 MG) MG tablet Take 1 tablet (400 mg total) by mouth 2 (two) times daily. 01/06/15  Yes Reyne Dumas, MD  metoCLOPramide (REGLAN) 10 MG tablet Take 1 tablet (10 mg total) by mouth daily. 05/01/12  Yes Rogene Houston, MD  metoprolol (LOPRESSOR) 100 MG tablet Take 1 tablet (100 mg total) by mouth 2 (two) times daily. 02/07/12  Yes Donney Dice, PA-C  Multiple Vitamin (MULTIVITAMIN) tablet Take 1 tablet by mouth daily.     Yes Historical Provider, MD  nitroGLYCERIN (NITROSTAT) 0.4 MG SL tablet Place 1 tablet (0.4 mg total)  under the tongue every 5 (five) minutes x 3 doses as needed. 01/07/15  Yes Carlena Bjornstad, MD  Omega-3 Fatty Acids (FISH OIL) 1000 MG CAPS Take 2 capsules by mouth daily.    Yes Historical Provider, MD  pantoprazole (PROTONIX) 40 MG tablet Take 40 mg by mouth daily.     Yes Historical Provider, MD  potassium chloride SA (K-DUR,KLOR-CON) 20 MEQ tablet Take 2 tablets (40 mEq total) by mouth daily. 01/05/15  Yes Reyne Dumas, MD  psyllium (METAMUCIL SMOOTH TEXTURE) 28 % packet Take 1 packet by mouth at bedtime. Patient taking differently: Take 1 packet by mouth at bedtime as needed (bowels).  03/13/13  Yes Rogene Houston, MD  rivaroxaban (XARELTO) 20 MG TABS tablet Take 1 tablet (20 mg total) by mouth daily. 01/27/15  Yes Reyne Dumas, MD  tamsulosin (FLOMAX) 0.4 MG CAPS capsule Take 0.4 mg by mouth daily after supper.   Yes Historical Provider, MD    Allergies  Allergen Reactions  . Iodine     REACTION: rash  . Penicillins     Rapid heart rate; flushed  . Sulfonamide Derivatives     Rapid heart rate; flushed    Physical Exam  Vitals  Blood pressure 122/86, pulse 97, temperature 98.2 F (36.8 C), temperature source Oral, resp. rate 18, height 6\' 2"  (1.88 m), weight 136.079 kg (300 lb), SpO2 100 %.   1. General morbidly obese male lying in bed in NAD,    2. Normal affect , Not Suicidal or Homicidal, Awake Alert, confused.  3. No F.N deficits, ALL C.Nerves Intact,  Sensation intact all 4 extremities, Plantars down going, agent was significant weakness in right upper extremity secondary to pain, as well can't bend his right knee secondary to pain as well.  4. Ears and Eyes appear Normal, Conjunctivae clear, PERRLA. Moist Oral Mucosa.  5. Supple Neck, No JVD, No cervical lymphadenopathy appriciated, No Carotid Bruits.  6. Symmetrical Chest wall movement, Good air movement bilaterally, CTAB.  7.No Gallops, Rubs or Murmurs, No Parasternal Heave.  8. Positive Bowel Sounds, Abdomen Soft,  No tenderness, No organomegaly appriciated,No rebound -guarding or rigidity.  9.  No Cyanosis, Normal Skin Turgor, No Skin Rash or Bruise.  10. Good muscle tone,  joints appear normal , no effusions, Normal ROM.  11. No Palpable Lymph Nodes in Neck or Axillae    Data Review  CBC  Recent Labs Lab 07/29/15 1326  WBC 7.0  HGB 13.7  HCT 42.1  PLT 149*  MCV 94.2  MCH 30.6  MCHC 32.5  RDW 14.4  LYMPHSABS 1.3  MONOABS 0.6  EOSABS 0.2  BASOSABS 0.0   ------------------------------------------------------------------------------------------------------------------  Chemistries   Recent Labs Lab 07/29/15 1326  NA 141  K 3.2*  CL 102  CO2 31  GLUCOSE 111*  BUN 11  CREATININE 0.78  CALCIUM 9.8  AST 21  ALT 19  ALKPHOS 60  BILITOT 0.7   ------------------------------------------------------------------------------------------------------------------ estimated creatinine clearance is 122.5 mL/min (by C-G formula based on Cr of 0.78). ------------------------------------------------------------------------------------------------------------------ No results for input(s): TSH, T4TOTAL, T3FREE, THYROIDAB in the last 72 hours.  Invalid input(s): FREET3   Coagulation profile No results for input(s): INR, PROTIME in the last 168 hours. ------------------------------------------------------------------------------------------------------------------- No results for input(s): DDIMER in the last 72 hours. -------------------------------------------------------------------------------------------------------------------  Cardiac Enzymes  Recent Labs Lab 07/29/15 1326  TROPONINI <0.03   ------------------------------------------------------------------------------------------------------------------ Invalid input(s): POCBNP   ---------------------------------------------------------------------------------------------------------------  Urinalysis    Component  Value Date/Time   COLORURINE YELLOW 07/29/2015 1450   APPEARANCEUR CLOUDY* 07/29/2015 1450   LABSPEC 1.015 07/29/2015 1450   PHURINE 7.5 07/29/2015 1450   GLUCOSEU NEGATIVE 07/29/2015 1450   HGBUR TRACE* 07/29/2015 1450   BILIRUBINUR NEGATIVE 07/29/2015 1450   KETONESUR NEGATIVE 07/29/2015 1450   PROTEINUR NEGATIVE 07/29/2015 1450   UROBILINOGEN 0.2 12/21/2014 0848   NITRITE NEGATIVE 07/29/2015 1450   LEUKOCYTESUR LARGE* 07/29/2015 1450    ----------------------------------------------------------------------------------------------------------------  Imaging results:   Dg Chest 1 View  07/29/2015  CLINICAL DATA:  Shoulder region pain. EXAM: CHEST 1 VIEW COMPARISON:  July 12, 2015 FINDINGS: There is no edema or consolidation. Heart is enlarged with pulmonary vascularity within normal limits. No adenopathy. Prominence the right peritracheal region is felt to represent great vessel prominence in this age group. There is extensive arthropathy in both shoulders. There is atherosclerotic calcification in the aortic arch region. No pneumothorax. IMPRESSION: Stable cardiomegaly. Prominence the right peritracheal region is likely due to great vessel prominence. No edema or consolidation. Advanced arthropathy noted in both shoulders. Electronically Signed   By: Lowella Grip III M.D.   On: 07/29/2015 14:48   Ct Head Wo Contrast  07/29/2015  CLINICAL DATA:  Altered mental status.  Recent UTI.  Shoulder pain. EXAM: CT HEAD WITHOUT CONTRAST TECHNIQUE: Contiguous axial images were obtained from the base of the skull through the vertex without intravenous contrast. COMPARISON:  07/12/2015, 09/23/2014 FINDINGS: There is no evidence of mass effect, midline shift, or extra-axial fluid collections. There is no evidence of a space-occupying lesion or intracranial hemorrhage. There is no evidence of a cortical-based area of acute infarction. There is generalized cerebral atrophy. There is  periventricular white matter low attenuation likely secondary to microangiopathy. The ventricles and sulci are appropriate for the patient's age. The basal cisterns are patent. Visualized portions of the orbits are unremarkable. The visualized portions of the paranasal sinuses and mastoid air cells are unremarkable. Cerebrovascular atherosclerotic calcifications are noted. The osseous structures are unremarkable. IMPRESSION: 1. No acute intracranial pathology. 2. Chronic microvascular disease and cerebral atrophy. Electronically Signed   By: Kathreen Devoid   On:  07/29/2015 14:58        Assessment & Plan  Active Problems:   Hyperlipidemia   Essential hypertension   Peripheral vascular disease (HCC)   Atrial fibrillation (HCC)   GERD (gastroesophageal reflux disease)   Gout   Right leg DVT (HCC)   Bilateral pulmonary embolism (HCC)   Altered mental status   UTI (lower urinary tract infection)  Altered mental status/generalized weakness - Patient presents with chronic complaint but complains over the last 3 weeks, hospitalized twice over the last 3 weeks at [facility], wife reports he had extensive workup, so we'll request records for further evaluation of her workup, but will go ahead and treat him for his UTI, will start him on gentle hydration, will replace his potassium, will check TSH, B-12, RPR and ammonia. - I think the main contributing factor to his symptoms is pain medicine , continue on home dose Norco (patient taking up to 6 tablets per day), it will decrease fentanyl patch from 125 g to 50 g.  UTI - Continue with IV Cipro, follow on urine cultures  Chronic pain syndrome - Shin with pain in multiple joints and chronic lower back pain, will decrease his fentanyl patch dose as I think is contributing to his encephalopathy, continue with when necessary Norco.  History of PE and multiple DVTs in the past - Continue with Xarelto  A. Fib - Continue with metoprolol and  Xarelto  Hyperlipidemia - Continue with statin  Peripheral vascular disease - Continue with Pletal  Hypertension - Continue home medication  Gout - Opinion with allopurinol  DVT Prophylaxis Xarelto  AM Labs Ordered, also please review Full Orders  Family Communication: Admission, patients condition and plan of care including tests being ordered have been discussed with the patient and wife who indicate understanding and agree with the plan and Code Status.  Code Status full code  Likely DC to  home  Condition GUARDED    Time spent in minutes : 55 minutes    Inge Waldroup M.D on 07/29/2015 at 4:30 PM  Between 7am to 7pm - Pager - [phone]  After 7pm go to [email or web address] - password [email or web address]  And look for the night coverage person covering me after hours  [facility] Office  [phone]

## 2015-07-29 NOTE — ED Notes (Signed)
EMS reports pt had some confusion and was diagnosed with uti 2 weeks ago.  Reports was on antibiotics for UTI.  Since then pt's confusion has gotten worse.  Says confusion is intermittent.  Pt denies pain.  Pt alert, answering questions.  Pt reports chronic r shoulder pain.  EMS noticed pt had slightly weaker grip strength  In r hand.  Also pt's r leg has limited mobility since surgery.

## 2015-07-29 NOTE — ED Notes (Signed)
Pt not ambulatory.  Dr Thurnell Garbe informed and verbal order to d/cd Orthostatic BP.

## 2015-07-29 NOTE — Progress Notes (Signed)
ANTIBIOTIC CONSULT NOTE-Preliminary  Pharmacy Consult for Cipro Indication: uti  Allergies  Allergen Reactions  . Iodine     REACTION: rash  . Penicillins     Rapid heart rate; flushed  . Sulfonamide Derivatives     Rapid heart rate; flushed    Patient Measurements: Height: 6\' 2"  (188 cm) Weight: 300 lb (136.079 kg) IBW/kg (Calculated) : 82.2  Vital Signs: Temp: 98.2 F (36.8 C) (12/13 1310) Temp Source: Oral (12/13 1310) BP: 149/100 mmHg (12/13 1530) Pulse Rate: 88 (12/13 1530)  Labs:  Recent Labs  07/29/15 1326  WBC 7.0  HGB 13.7  PLT 149*  CREATININE 0.78    Estimated Creatinine Clearance: 122.5 mL/min (by C-G formula based on Cr of 0.78).  No results for input(s): VANCOTROUGH, VANCOPEAK, VANCORANDOM, GENTTROUGH, GENTPEAK, GENTRANDOM, TOBRATROUGH, TOBRAPEAK, TOBRARND, AMIKACINPEAK, AMIKACINTROU, AMIKACIN in the last 72 hours.   Microbiology: No results found for this or any previous visit (from the past 720 hour(s)).  Medical History: Past Medical History  Diagnosis Date  . Atrial fibrillation ([place])     Postop hip surgery, January 2012, digoxin and diltiazem, reverted to sinus in the hospital  . Osteoarthritis   . COPD (chronic obstructive pulmonary disease) (HCC)     Emphysema  . Depression   . Posttraumatic stress disorder   . GERD (gastroesophageal reflux disease)   . Irritable bowel   . BPH (benign prostatic hyperplasia)   . Urinary tract bacterial infections   . Osteoporosis   . Hypertension   . Dyslipidemia   . CAD (coronary artery disease)     Stents placed in [place] and [place] in the past  /Myoview 2008, no ischemia, /   . Atrial flutter ([place])     History of the past with syncope and  ?? H. and consideration, no records  . 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 Hip pain   . Shoulder pain   . Gout   . Peripheral arterial disease ([place])     Dopplers Dr.Vyas office October, 2010, apparent occlusion anterior tibial arteries  bilaterally., findings suggest hemodynamically significant stenosis of the proximal right provisional femoral artery and the mid left superficial artery.  Ankle-brachial indices indicate moderate stenosis within both lower extremities  . Chronic kidney disease     Creatinine 2.1, October 13, 2010--> 0.74 10/2012  . Hyperlipidemia     Mixed  . GI bleed   . Ejection fraction     EF 50-55%, echo, May, 2013, severe inferior hypokinesis  . Bilateral pulmonary embolism ([place])     a. 10/2012 - V:Q scan high prob for Bilat PE in setting of dyspnea/hemoptysis-->anticoagulation started.  . Right leg DVT ([place])     a. 10/2012 - subocclusive DVT R popliteal vein.  Marland Kitchen COPD (chronic obstructive pulmonary disease) ([place])   . Chronic pain     bilat hips, right shoulder, RLE   Anti-infectives    Start     Dose/Rate Route Frequency Ordered Stop   07/29/15 1545  ciprofloxacin (CIPRO) IVPB 400 mg     400 mg 200 mL/hr over 60 Minutes Intravenous  Once 07/29/15 1542       Assessment: 74yo obese male.  Asked to order Cipro for UTI.  Estimated Creatinine Clearance: 122.5 mL/min (by C-G formula based on Cr of 0.78).  Goal of Therapy:  Eradicate infection.  Plan:  Preliminary review of pertinent patient information completed.  Protocol will be initiated with a one-time dose(s) of Cipro 400mg .  Deneise Lever  [facility] clinical pharmacist will complete review during morning rounds to assess patient and finalize treatment regimen.  Hart Robinsons A, [place] 07/29/2015,3:43 PM

## 2015-07-29 NOTE — ED Notes (Signed)
CBG with EMS 110

## 2015-07-29 NOTE — ED Provider Notes (Signed)
CSN: [id number]     Arrival date & time 07/29/15  1303 History   First MD Initiated Contact with Patient 07/29/15 1311     Chief Complaint  Patient presents with  . Altered Mental Status      Patient is a 74 y.o. male presenting with altered mental status. The history is provided by the patient and the spouse. The history is limited by the condition of the patient (AMS).  Altered Mental Status Pt was seen at 1330. Per pt and his wife: Pt with gradual onset and worsening of persistent "confusion" for the past 2 weeks. Pt was evaluated at OSH, dx UTI, tx abx. Pt's wife states "he's still confused" and "it's getting worse." Has been associated with decreased PO intake. Pt states he "just gets confused." Pt himself denies any complaints. Denies any recent changes in meds. No fevers, no rash, no CP/SOB, no cough, no abd pain, no N/V/D, no new focal motor weakness.     Past Medical History  Diagnosis Date  . Atrial fibrillation ([place])     Postop hip surgery, January 2012, digoxin and diltiazem, reverted to sinus in the hospital  . Osteoarthritis   . COPD (chronic obstructive pulmonary disease) (HCC)     Emphysema  . Depression   . Posttraumatic stress disorder   . GERD (gastroesophageal reflux disease)   . Irritable bowel   . BPH (benign prostatic hyperplasia)   . Urinary tract bacterial infections   . Osteoporosis   . Hypertension   . Dyslipidemia   . CAD (coronary artery disease)     Stents placed in [place] and [place] in the past  /Myoview 2008, no ischemia, /   . Atrial flutter ([place])     History of the past with syncope and  ?? H. and consideration, no records  . 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 Hip pain   . Shoulder pain   . Gout   . Peripheral arterial disease ([place])     Dopplers Dr.Vyas office October, 2010, apparent occlusion anterior tibial arteries bilaterally., findings suggest hemodynamically significant stenosis of the proximal right provisional  femoral artery and the mid left superficial artery.  Ankle-brachial indices indicate moderate stenosis within both lower extremities  . Chronic kidney disease     Creatinine 2.1, October 13, 2010--> 0.74 10/2012  . Hyperlipidemia     Mixed  . GI bleed   . Ejection fraction     EF 50-55%, echo, May, 2013, severe inferior hypokinesis  . Bilateral pulmonary embolism ([place] [place])     a. 10/2012 - V:Q scan high prob for Bilat PE in setting of dyspnea/hemoptysis-->anticoagulation started.  . Right leg DVT ([place])     a. 10/2012 - subocclusive DVT R popliteal vein.  Marland Kitchen COPD (chronic obstructive pulmonary disease) ([place])   . Chronic pain     bilat hips, right shoulder, RLE   Past Surgical History  Procedure Laterality Date  . Knee arthroplasty      Right  . Total knee arthroplasty      Left  . Total hip arthroplasty      Right-complicated hx  . Colon resection    . Back surgery      [place]  . Prostate surgery    . Colonoscopy N/A 03/13/2013    Procedure: COLONOSCOPY;  Surgeon: Rogene Houston, MD;  [facility];  Service: Endoscopy;  Laterality: N/A;  730  . Cataract extraction    . Colonoscopy N/A  12/22/2014    Procedure: COLONOSCOPY;  Surgeon: Rogene Houston, MD;  Location: [facility];  Service: Endoscopy;  Laterality: N/A;  . Givens capsule study N/A 12/24/2014    Procedure: GIVENS CAPSULE STUDY;  Surgeon: Rogene Houston, MD;  Location: [facility];  Service: Endoscopy;  Laterality: N/A;  . Coronary angioplasty with stent placement      x 4   Family History  Problem Relation Age of Onset  . Diabetes Daughter   . Hypertension Daughter   . Obesity Daughter    Social History  Substance Use Topics  . Smoking status: Former Smoker -- 1.00 packs/day for 30 years    Types: Cigarettes    Quit date: 08/16/2006  . Smokeless tobacco: Never Used     Comment: Patient smoked a pack a day for about 30 years  . Alcohol Use: No    Review of Systems  Unable to perform ROS:  Mental status change     Allergies  Iodine; Penicillins; and Sulfonamide derivatives  Home Medications   Prior to Admission medications   Medication Sig Start Date End Date Taking? Authorizing Provider  allopurinol (ZYLOPRIM) 100 MG tablet Take 100 mg by mouth 2 (two) times daily.     Historical Provider, MD  alum & mag hydroxide-simeth (MYLANTA) 200-200-20 MG/5ML suspension Take 15 mLs by mouth as needed for indigestion.     Historical Provider, MD  ascorbic acid (VITAMIN C) 500 MG tablet Take 500 mg by mouth daily.     Historical Provider, MD  calcium-vitamin D (OSCAL WITH D) 500-200 MG-UNIT per tablet Take 1 tablet by mouth daily.     Historical Provider, MD  diltiazem (CARDIZEM CD) 120 MG 24 hr capsule Take 1 capsule (120 mg total) by mouth daily. As directed based off blood pressure readings 12/25/14   Kelvin Cellar, MD  fenofibrate (TRICOR) 145 MG tablet Take 1 tablet (145 mg total) by mouth daily. 08/16/11   Ezra Sites, MD  fentaNYL (DURAGESIC - DOSED MCG/HR) 100 MCG/HR Place 1 patch onto the skin every 3 (three) days. 11/02/14   Historical Provider, MD  Ferrous Sulfate (RA IRON) 27 MG TABS Take 1 tablet by mouth daily.      Historical Provider, MD  finasteride (PROSCAR) 5 MG tablet Take 5 mg by mouth daily.      Historical Provider, MD  furosemide (LASIX) 20 MG tablet Take 2 tablets (40 mg total) by mouth daily. Patient taking differently: Take 80 mg by mouth daily.  11/02/13   Carlena Bjornstad, MD  HYDROcodone-acetaminophen (NORCO/VICODIN) 5-325 MG per tablet Take 1-2 tablets by mouth every 6 (six) hours as needed for moderate pain or severe pain.  11/18/14   Historical Provider, MD  magnesium oxide (MAG-OX) 400 (241.3 MG) MG tablet Take 1 tablet (400 mg total) by mouth 2 (two) times daily. 01/06/15   Reyne Dumas, MD  metoCLOPramide (REGLAN) 10 MG tablet Take 1 tablet (10 mg total) by mouth daily. 05/01/12   Rogene Houston, MD  metoprolol (LOPRESSOR) 100 MG tablet Take 1 tablet (100  mg total) by mouth 2 (two) times daily. 02/07/12   Donney Dice, PA-C  Multiple Vitamin (MULTIVITAMIN) tablet Take 1 tablet by mouth daily.      Historical Provider, MD  nitroGLYCERIN (NITROSTAT) 0.4 MG SL tablet Place 1 tablet (0.4 mg total) under the tongue every 5 (five) minutes x 3 doses as needed. 01/07/15   Carlena Bjornstad, MD  Omega-3 Fatty Acids (  FISH OIL) 1000 MG CAPS Take 2 capsules by mouth daily.     Historical Provider, MD  pantoprazole (PROTONIX) 40 MG tablet Take 40 mg by mouth daily.      Historical Provider, MD  potassium chloride SA (K-DUR,KLOR-CON) 20 MEQ tablet Take 2 tablets (40 mEq total) by mouth daily. 01/05/15   Reyne Dumas, MD  psyllium (METAMUCIL SMOOTH TEXTURE) 28 % packet Take 1 packet by mouth at bedtime. 03/13/13   Rogene Houston, MD  rivaroxaban (XARELTO) 20 MG TABS tablet Take 1 tablet (20 mg total) by mouth daily. 01/27/15   Reyne Dumas, MD  rosuvastatin (CRESTOR) 40 MG tablet Take 1 tablet (40 mg total) by mouth every evening. 02/07/12   Donney Dice, PA-C  tamsulosin (FLOMAX) 0.4 MG CAPS capsule Take 0.4 mg by mouth daily after supper.    Historical Provider, MD   BP 124/89 mmHg  Pulse 98  Temp(Src) 98.2 F (36.8 C) (Oral)  Resp 16  Ht 6\' 2"  (1.88 m)  Wt 300 lb (136.079 kg)  BMI 38.50 kg/m2  SpO2 100% Physical Exam 1335: Physical examination:  Nursing notes reviewed; Vital signs and O2 SAT reviewed;  Constitutional: Well developed, Well nourished, In no acute distress; Head:  Normocephalic, atraumatic; Eyes: EOMI, PERRL, No scleral icterus; ENMT: Mouth and pharynx normal, Mucous membranes dry; Neck: Supple, Full range of motion, No lymphadenopathy; Cardiovascular: Irregular rate and rhythm, No gallop; Respiratory: Breath sounds clear & equal bilaterally, No wheezes.  Speaking sentences, Normal respiratory effort/excursion; Chest: Nontender, Movement normal; Abdomen: Soft, Nontender, Nondistended, Normal bowel sounds; Genitourinary: No CVA tenderness;  Extremities: Pulses normal, No tenderness, No edema, No calf edema or asymmetry.; Neuro: Awake, alert, confused re: events. Major CN grossly intact. No facial droop.  Speech clear. Grips equal. Decreased ROM RUE and RLE per baseline chronic pain, per wife at bedside. Otherwise no gross focal motor deficits in extremities.; Skin: Color normal, Warm, Dry.   ED Course  Procedures (including critical care time) Labs Review   Imaging Review  I have personally reviewed and evaluated these images and lab results as part of my medical decision-making.   EKG Interpretation   Date/Time:  Tuesday July 29 2015 13:09:25 EST Ventricular Rate:  95 PR Interval:    QRS Duration: 100 QT Interval:  354 QTC Calculation: 445 R Axis:   26 Text Interpretation:  Atrial fibrillation Low voltage, precordial leads  Baseline wander Artifact When compared with ECG of 01/03/2015 No  significant change was found Confirmed by [facility]  MD, Nunzio Cory [phone]) on  07/29/2015 2:08:26 PM      MDM  MDM Reviewed: previous chart, nursing note and vitals Reviewed previous: labs and ECG Interpretation: labs, ECG, x-ray and CT scan   Results for orders placed or performed during the hospital encounter of 07/29/15  Urinalysis, Routine w reflex microscopic  Result Value Ref Range   Color, Urine YELLOW YELLOW   APPearance CLOUDY (A) CLEAR   Specific Gravity, Urine 1.015 1.005 - 1.030   pH 7.5 5.0 - 8.0   Glucose, UA NEGATIVE NEGATIVE mg/dL   Hgb urine dipstick TRACE (A) NEGATIVE   Bilirubin Urine NEGATIVE NEGATIVE   Ketones, ur NEGATIVE NEGATIVE mg/dL   Protein, ur NEGATIVE NEGATIVE mg/dL   Nitrite NEGATIVE NEGATIVE   Leukocytes, UA LARGE (A) NEGATIVE  Comprehensive metabolic panel  Result Value Ref Range   Sodium 141 135 - 145 mmol/L   Potassium 3.2 (L) 3.5 - 5.1 mmol/L   Chloride 102 101 - 111  mmol/L   CO2 31 22 - 32 mmol/L   Glucose, Bld 111 (H) 65 - 99 mg/dL   BUN 11 6 - 20 mg/dL   Creatinine, Ser  0.78 0.61 - 1.24 mg/dL   Calcium 9.8 8.9 - 10.3 mg/dL   Total Protein 6.3 (L) 6.5 - 8.1 g/dL   Albumin 3.2 (L) 3.5 - 5.0 g/dL   AST 21 15 - 41 U/L   ALT 19 17 - 63 U/L   Alkaline Phosphatase 60 38 - 126 U/L   Total Bilirubin 0.7 0.3 - 1.2 mg/dL   GFR calc non Af Amer >60 >60 mL/min   GFR calc Af Amer >60 >60 mL/min   Anion gap 8 5 - 15  Troponin I  Result Value Ref Range   Troponin I <0.03 <0.031 ng/mL  CBC with Differential  Result Value Ref Range   WBC 7.0 4.0 - 10.5 K/uL   RBC 4.47 4.22 - 5.81 MIL/uL   Hemoglobin 13.7 13.0 - 17.0 g/dL   HCT 42.1 39.0 - 52.0 %   MCV 94.2 78.0 - 100.0 fL   MCH 30.6 26.0 - 34.0 pg   MCHC 32.5 30.0 - 36.0 g/dL   RDW 14.4 11.5 - 15.5 %   Platelets 149 (L) 150 - 400 K/uL   Neutrophils Relative % 69 %   Neutro Abs 4.8 1.7 - 7.7 K/uL   Lymphocytes Relative 19 %   Lymphs Abs 1.3 0.7 - 4.0 K/uL   Monocytes Relative 9 %   Monocytes Absolute 0.6 0.1 - 1.0 K/uL   Eosinophils Relative 3 %   Eosinophils Absolute 0.2 0.0 - 0.7 K/uL   Basophils Relative 0 %   Basophils Absolute 0.0 0.0 - 0.1 K/uL  Lactic acid, plasma  Result Value Ref Range   Lactic Acid, Venous 1.4 0.5 - 2.0 mmol/L  Urine microscopic-add on  Result Value Ref Range   Squamous Epithelial / LPF 0-5 (A) NONE SEEN   WBC, UA TOO NUMEROUS TO COUNT 0 - 5 WBC/hpf   RBC / HPF 0-5 0 - 5 RBC/hpf   Bacteria, UA MANY (A) NONE SEEN    Dg Chest 1 View 07/29/2015  CLINICAL DATA:  Shoulder region pain. EXAM: CHEST 1 VIEW COMPARISON:  July 12, 2015 FINDINGS: There is no edema or consolidation. Heart is enlarged with pulmonary vascularity within normal limits. No adenopathy. Prominence the right peritracheal region is felt to represent great vessel prominence in this age group. There is extensive arthropathy in both shoulders. There is atherosclerotic calcification in the aortic arch region. No pneumothorax. IMPRESSION: Stable cardiomegaly. Prominence the right peritracheal region is likely due to  great vessel prominence. No edema or consolidation. Advanced arthropathy noted in both shoulders. Electronically Signed   By: Lowella Grip III M.D.   On: 07/29/2015 14:48   Ct Head Wo Contrast 07/29/2015  CLINICAL DATA:  Altered mental status.  Recent UTI.  Shoulder pain. EXAM: CT HEAD WITHOUT CONTRAST TECHNIQUE: Contiguous axial images were obtained from the base of the skull through the vertex without intravenous contrast. COMPARISON:  07/12/2015, 09/23/2014 FINDINGS: There is no evidence of mass effect, midline shift, or extra-axial fluid collections. There is no evidence of a space-occupying lesion or intracranial hemorrhage. There is no evidence of a cortical-based area of acute infarction. There is generalized cerebral atrophy. There is periventricular white matter low attenuation likely secondary to microangiopathy. The ventricles and sulci are appropriate for the patient's age. The basal cisterns are patent. Visualized portions of  the orbits are unremarkable. The visualized portions of the paranasal sinuses and mastoid air cells are unremarkable. Cerebrovascular atherosclerotic calcifications are noted. The osseous structures are unremarkable. IMPRESSION: 1. No acute intracranial pathology. 2. Chronic microvascular disease and cerebral atrophy. Electronically Signed   By: Kathreen Devoid   On: 07/29/2015 14:58    1555:   Potassium repleted PO. +UTI, UC pending; given pt's allergies, will dose IV cipro (pt's wife states she knows that he was not on this abx previously). Dx and testing d/w pt and family.  Questions answered.  Verb understanding, agreeable to admit. T/C to [facility] Labs, case discussed, including:  HPI, pertinent PM/SHx, VS/PE, dx testing, ED course and treatment:  Agreeable to admit, requests to write temporary orders, obtain medical bed to team [facility].   Francine Graven, DO 08/01/15 2338

## 2015-07-29 NOTE — ED Notes (Signed)
Hospitalist at bedside 

## 2015-07-30 DIAGNOSIS — K219 Gastro-esophageal reflux disease without esophagitis: Secondary | ICD-10-CM | POA: Diagnosis present

## 2015-07-30 DIAGNOSIS — Z96653 Presence of artificial knee joint, bilateral: Secondary | ICD-10-CM | POA: Diagnosis present

## 2015-07-30 DIAGNOSIS — R531 Weakness: Secondary | ICD-10-CM

## 2015-07-30 DIAGNOSIS — Z9849 Cataract extraction status, unspecified eye: Secondary | ICD-10-CM | POA: Diagnosis not present

## 2015-07-30 DIAGNOSIS — T404X5A Adverse effect of other synthetic narcotics, initial encounter: Secondary | ICD-10-CM | POA: Diagnosis present

## 2015-07-30 DIAGNOSIS — Z79899 Other long term (current) drug therapy: Secondary | ICD-10-CM | POA: Diagnosis not present

## 2015-07-30 DIAGNOSIS — M1990 Unspecified osteoarthritis, unspecified site: Secondary | ICD-10-CM | POA: Diagnosis present

## 2015-07-30 DIAGNOSIS — Z955 Presence of coronary angioplasty implant and graft: Secondary | ICD-10-CM | POA: Diagnosis not present

## 2015-07-30 DIAGNOSIS — Z86718 Personal history of other venous thrombosis and embolism: Secondary | ICD-10-CM | POA: Diagnosis not present

## 2015-07-30 DIAGNOSIS — Z6838 Body mass index (BMI) 38.0-38.9, adult: Secondary | ICD-10-CM | POA: Diagnosis not present

## 2015-07-30 DIAGNOSIS — E785 Hyperlipidemia, unspecified: Secondary | ICD-10-CM | POA: Diagnosis present

## 2015-07-30 DIAGNOSIS — F4310 Post-traumatic stress disorder, unspecified: Secondary | ICD-10-CM | POA: Diagnosis present

## 2015-07-30 DIAGNOSIS — I2510 Atherosclerotic heart disease of native coronary artery without angina pectoris: Secondary | ICD-10-CM | POA: Diagnosis present

## 2015-07-30 DIAGNOSIS — N401 Enlarged prostate with lower urinary tract symptoms: Secondary | ICD-10-CM | POA: Diagnosis present

## 2015-07-30 DIAGNOSIS — M810 Age-related osteoporosis without current pathological fracture: Secondary | ICD-10-CM | POA: Diagnosis present

## 2015-07-30 DIAGNOSIS — G9340 Encephalopathy, unspecified: Secondary | ICD-10-CM | POA: Diagnosis not present

## 2015-07-30 DIAGNOSIS — I10 Essential (primary) hypertension: Secondary | ICD-10-CM | POA: Diagnosis not present

## 2015-07-30 DIAGNOSIS — I482 Chronic atrial fibrillation: Secondary | ICD-10-CM | POA: Diagnosis not present

## 2015-07-30 DIAGNOSIS — G894 Chronic pain syndrome: Secondary | ICD-10-CM | POA: Diagnosis present

## 2015-07-30 DIAGNOSIS — I2699 Other pulmonary embolism without acute cor pulmonale: Secondary | ICD-10-CM

## 2015-07-30 DIAGNOSIS — I4892 Unspecified atrial flutter: Secondary | ICD-10-CM | POA: Diagnosis present

## 2015-07-30 DIAGNOSIS — G92 Toxic encephalopathy: Secondary | ICD-10-CM | POA: Diagnosis present

## 2015-07-30 DIAGNOSIS — N189 Chronic kidney disease, unspecified: Secondary | ICD-10-CM | POA: Diagnosis present

## 2015-07-30 DIAGNOSIS — F329 Major depressive disorder, single episode, unspecified: Secondary | ICD-10-CM | POA: Diagnosis present

## 2015-07-30 DIAGNOSIS — I4891 Unspecified atrial fibrillation: Secondary | ICD-10-CM | POA: Diagnosis present

## 2015-07-30 DIAGNOSIS — I129 Hypertensive chronic kidney disease with stage 1 through stage 4 chronic kidney disease, or unspecified chronic kidney disease: Secondary | ICD-10-CM | POA: Diagnosis present

## 2015-07-30 DIAGNOSIS — B9689 Other specified bacterial agents as the cause of diseases classified elsewhere: Secondary | ICD-10-CM | POA: Diagnosis present

## 2015-07-30 DIAGNOSIS — Z7901 Long term (current) use of anticoagulants: Secondary | ICD-10-CM | POA: Diagnosis not present

## 2015-07-30 DIAGNOSIS — M109 Gout, unspecified: Secondary | ICD-10-CM | POA: Diagnosis present

## 2015-07-30 DIAGNOSIS — J449 Chronic obstructive pulmonary disease, unspecified: Secondary | ICD-10-CM | POA: Diagnosis present

## 2015-07-30 DIAGNOSIS — N390 Urinary tract infection, site not specified: Secondary | ICD-10-CM | POA: Diagnosis present

## 2015-07-30 DIAGNOSIS — Z96641 Presence of right artificial hip joint: Secondary | ICD-10-CM | POA: Diagnosis present

## 2015-07-30 DIAGNOSIS — Z87891 Personal history of nicotine dependence: Secondary | ICD-10-CM | POA: Diagnosis not present

## 2015-07-30 DIAGNOSIS — I739 Peripheral vascular disease, unspecified: Secondary | ICD-10-CM | POA: Diagnosis present

## 2015-07-30 DIAGNOSIS — Z86711 Personal history of pulmonary embolism: Secondary | ICD-10-CM | POA: Diagnosis not present

## 2015-07-30 LAB — BASIC METABOLIC PANEL
ANION GAP: 6 (ref 5–15)
BUN: 10 mg/dL (ref 6–20)
CALCIUM: 9.4 mg/dL (ref 8.9–10.3)
CHLORIDE: 107 mmol/L (ref 101–111)
CO2: 27 mmol/L (ref 22–32)
Creatinine, Ser: 0.74 mg/dL (ref 0.61–1.24)
GFR calc non Af Amer: >60 mL/min (ref >60)
GLUCOSE: 126 mg/dL — AB (ref 65–99)
Potassium: 3.6 mmol/L (ref 3.5–5.1)
Sodium: 140 mmol/L (ref 135–145)

## 2015-07-30 LAB — CBC
HCT: 40.1 % (ref 39.0–52.0)
HEMOGLOBIN: 12.9 g/dL — AB (ref 13.0–17.0)
MCH: 30.4 pg (ref 26.0–34.0)
MCHC: 32.2 g/dL (ref 30.0–36.0)
MCV: 94.4 fL (ref 78.0–100.0)
Platelets: 151 10*3/uL (ref 150–400)
RBC: 4.25 MIL/uL (ref 4.22–5.81)
RDW: 14.7 % (ref 11.5–15.5)
WBC: 6.6 10*3/uL (ref 4.0–10.5)

## 2015-07-30 LAB — HIV ANTIBODY (ROUTINE TESTING W REFLEX): HIV Screen 4th Generation wRfx: NONREACTIVE

## 2015-07-30 LAB — RPR: RPR: NONREACTIVE

## 2015-07-30 LAB — MRSA PCR SCREENING: MRSA BY PCR: POSITIVE — AB

## 2015-07-30 NOTE — Care Management Note (Signed)
Case Management Note  Patient Details  Name: Bryce Berry MRN: [id number] Date of Birth: 05-Jan-1941  Subjective/Objective:                  Pt admitted from home with UTI/altered mental status. Pt lives with his wife and wife provides maximal assistance with ADL's. Pt is active with [facility] RN, PT, and OT. Pt has hospital bed, electric w/c, BSC, cane, walker, and hoyer lift.   Action/Plan: Will continue to follow for discharge planning needs. PT recommends SNF. CSW is aware.  Expected Discharge Date:                  Expected Discharge Plan:  Skilled Nursing Facility  In-House Referral:  Clinical Social Work  Discharge planning Services  CM Consult  Post Acute Care Choice:  NA Choice offered to:  NA  DME Arranged:    DME Agency:     HH Arranged:    [place] Agency:     Status of Service:  In process, will continue to follow  Medicare Important Message Given:    Date Medicare IM Given:    Medicare IM give by:    Date Additional Medicare IM Given:    Additional Medicare Important Message give by:     If discussed at [place] of Stay Meetings, dates discussed:    Additional Comments:  Joylene Draft, RN 07/30/2015, 2:09 PM

## 2015-07-30 NOTE — Progress Notes (Signed)
[facility] PROGRESS NOTE  Bryce Berry [id number] DOB: 27-Aug-1940 DOA: 07/29/2015 PCP: Glenda Chroman., MD  Assessment/Plan: Generalized weakness -B-12/TSH within normal limits. -UTI as well as narcotics are believed to be playing a role in his weakness. -Has been evaluated by physical therapy with recommendations for SNF, will alert social work.  Gram-negative rod UTI -Continue Cipro pending final culture data.  Chronic pain syndrome -Fentanyl patch dose has been reduced from 125-50 g given concerns that this may be playing a role in his weakness and encephalopathy.  Acute encephalopathy -Appears resolved, wife agrees.  History of PE and DVT -Continue Xarelto  Atrial fibrillation -Rate controlled on metoprolol and anticoagulated on Xarelto.  Hyperlipidemia -Continue statin  Hypertension -Well controlled, continue current medications.  Code Status: Full code Family Communication: Wife updated on plan of care  Disposition Plan: PT has recommended SNF, should be medically stable and 24-48 hours   Consultants:  None   Antibiotics:  Cipro   Subjective: No complaints, denies chest pain, shortness of breath  Objective: Filed Vitals:   07/29/15 2100 07/30/15 0700 07/30/15 1425 07/30/15 1504  BP: 128/84 138/82  109/73  Pulse: 104 72 101 79  Temp: 98 F (36.7 C) 98 F (36.7 C)  98.6 F (37 C)  TempSrc: Oral Oral  Oral  Resp: 20 20  20   Height:      Weight:      SpO2: 100% 100%  97%    Intake/Output Summary (Last 24 hours) at 07/30/15 1515 Last data filed at 07/30/15 0830  Gross per 24 hour  Intake   1155 ml  Output      0 ml  Net   1155 ml   Filed Weights   07/29/15 1310  Weight: 136.079 kg (300 lb)    Exam:   General:  Alert, awake, oriented 3  Cardiovascular: Regular rate and rhythm  Respiratory: Clear to auscultation bilaterally  Abdomen: Soft, nontender, nondistended, positive bowel sounds  Extremities: No  clubbing, cyanosis or edema, positive pulses   Neurologic:  Grossly intact and nonfocal  Data Reviewed: Basic Metabolic Panel:  Recent Labs Lab 07/29/15 1326 07/30/15 0634  NA 141 140  K 3.2* 3.6  CL 102 107  CO2 31 27  GLUCOSE 111* 126*  BUN 11 10  CREATININE 0.78 0.74  CALCIUM 9.8 9.4   Liver Function Tests:  Recent Labs Lab 07/29/15 1326  AST 21  ALT 19  ALKPHOS 60  BILITOT 0.7  PROT 6.3*  ALBUMIN 3.2*   No results for input(s): LIPASE, AMYLASE in the last 168 hours.  Recent Labs Lab 07/29/15 1632  AMMONIA 18   CBC:  Recent Labs Lab 07/29/15 1326 07/30/15 0634  WBC 7.0 6.6  NEUTROABS 4.8  --   HGB 13.7 12.9*  HCT 42.1 40.1  MCV 94.2 94.4  PLT 149* 151   Cardiac Enzymes:  Recent Labs Lab 07/29/15 1326  TROPONINI <0.03   BNP (last 3 results) No results for input(s): BNP in the last 8760 hours.  ProBNP (last 3 results) No results for input(s): PROBNP in the last 8760 hours.  CBG: No results for input(s): GLUCAP in the last 168 hours.  Recent Results (from the past 240 hour(s))  Urine culture     Status: None (Preliminary result)   Collection Time: 07/29/15  2:50 PM  Result Value Ref Range Status   Specimen Description URINE, CATHETERIZED  Final   Special Requests NONE  Final   Culture   Final    >=  100,000 COLONIES/mL GRAM NEGATIVE RODS CULTURE REINCUBATED FOR BETTER GROWTH Performed at [facility]    Report Status PENDING  Incomplete     Studies: Dg Chest 1 View  07/29/2015  CLINICAL DATA:  Shoulder region pain. EXAM: CHEST 1 VIEW COMPARISON:  July 12, 2015 FINDINGS: There is no edema or consolidation. Heart is enlarged with pulmonary vascularity within normal limits. No adenopathy. Prominence the right peritracheal region is felt to represent great vessel prominence in this age group. There is extensive arthropathy in both shoulders. There is atherosclerotic calcification in the aortic arch region. No pneumothorax.  IMPRESSION: Stable cardiomegaly. Prominence the right peritracheal region is likely due to great vessel prominence. No edema or consolidation. Advanced arthropathy noted in both shoulders. Electronically Signed   By: Lowella Grip III M.D.   On: 07/29/2015 14:48   Ct Head Wo Contrast  07/29/2015  CLINICAL DATA:  Altered mental status.  Recent UTI.  Shoulder pain. EXAM: CT HEAD WITHOUT CONTRAST TECHNIQUE: Contiguous axial images were obtained from the base of the skull through the vertex without intravenous contrast. COMPARISON:  07/12/2015, 09/23/2014 FINDINGS: There is no evidence of mass effect, midline shift, or extra-axial fluid collections. There is no evidence of a space-occupying lesion or intracranial hemorrhage. There is no evidence of a cortical-based area of acute infarction. There is generalized cerebral atrophy. There is periventricular white matter low attenuation likely secondary to microangiopathy. The ventricles and sulci are appropriate for the patient's age. The basal cisterns are patent. Visualized portions of the orbits are unremarkable. The visualized portions of the paranasal sinuses and mastoid air cells are unremarkable. Cerebrovascular atherosclerotic calcifications are noted. The osseous structures are unremarkable. IMPRESSION: 1. No acute intracranial pathology. 2. Chronic microvascular disease and cerebral atrophy. Electronically Signed   By: Kathreen Devoid   On: 07/29/2015 14:58    Scheduled Meds: . sodium chloride   Intravenous STAT  . allopurinol  100 mg Oral Daily  . antiseptic oral rinse  7 mL Mouth Rinse BID  . atorvastatin  80 mg Oral Daily  . calcium-vitamin D  1 tablet Oral Daily  . cilostazol  100 mg Oral Daily  . ciprofloxacin  400 mg Intravenous Q12H  . diclofenac sodium  1 application Topical Daily  . diltiazem  120 mg Oral Daily  . docusate sodium  100 mg Oral BID  . fenofibrate  160 mg Oral Daily  . fentaNYL  50 mcg Transdermal Q72H  . ferrous sulfate   325 mg Oral Q breakfast  . finasteride  5 mg Oral Daily  . magnesium oxide  400 mg Oral BID  . metoprolol  100 mg Oral BID  . omega-3 acid ethyl esters  1 g Oral Daily  . pantoprazole  40 mg Oral Daily  . potassium chloride SA  40 mEq Oral Daily  . rivaroxaban  20 mg Oral QPC supper  . sodium chloride  3 mL Intravenous Q12H  . tamsulosin  0.4 mg Oral QPC supper  . ascorbic acid  500 mg Oral Daily   Continuous Infusions: . sodium chloride 75 mL/hr at 07/29/15 1448    Active Problems:   Hyperlipidemia   Essential hypertension   Peripheral vascular disease (HCC)   Atrial fibrillation (HCC)   GERD (gastroesophageal reflux disease)   Gout   Right leg DVT (HCC)   Bilateral pulmonary embolism (HCC)   Altered mental status   UTI (lower urinary tract infection)    Time spent: 25 minutes. Greater than 50% of  this time was spent in direct contact with the patient coordinating care.    Lelon Frohlich  [facility] Pager [phone]  If 7PM-7AM, please contact night-coverage at [email or web address], password [facility] 07/30/2015, 3:15 PM  LOS: 0 days

## 2015-07-30 NOTE — Evaluation (Signed)
Physical Therapy Evaluation Patient Details Name: Bryce Berry MRN: [id number] DOB: 1941/07/01 Today's Date: 07/30/2015   History of Present Illness  Pt is an obese 74yo black male with a complex medical history. 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 Pt also reports chronicly impaired function in the RUE pertaining to both a remote rotator cuff insult, and remote cervical radicular pain in the RUE. 3 weeks ago, wife reoprts pt began to present c AMS, resultants in impaired ability to participate with mobility in bed and transferrs. Pt has since been at [facility] 2 times in last 3 weeks, DC with ABX for UTI, and come to [facility], now found to be with UTI.   Clinical Impression  Pt is received semirecumbent in bed upon entry, willing to participate, with wife at bedside. Pt is reporting 5/10 RLE pain for which he has received some pain meds, and which he reports as a chronic problem. Pt AMS remains, as confirmed by wife, but she says is improving. He is able to answer most orientation questions, but is having some anomia, and mild disorientation to time and place. Pt is demonstrating a profound decrease in functional strength and independence at this visit, wherein he is at baseline able to perform rolling in bed independently, today he requires Total assist x2. After rolling for bathing, patient is unable to attempt transfers due to fatigue. Transfers are arguably not appropriate at this time for assessment purposes, as his motor patterns and level of independence are strongly tied in to available equipment at home, which is not available at the time of evaluation. 1. His bed height and power chair height are equal at home facilitating slide board transfers, whereas there is an 8" height differential between available bariatric WC and  hospital bed here; pt also uses a trapeze at home for return to bed, which is currently not available.) Pt demonstrating impaired strength, mobility, activity tolerance, and mental status, for which he will benefit from skilled PT intervention to return to prior level of independence, in order to maintain quality of life, general wellness, and decrease caregiver burden. Pt can tolerate PT a minimum of 3-4 days per week and is appropriate for DC to STR.             Patient presenting with impairment of strength, range of motion, balance, oxygen perfusion, and activity tolerance, limiting ability to perform ADL and mobility tasks at  baseline level of function. Patient will benefit from skilled intervention to address the above impairments and limitations, in order to restore to prior level of function, improve patient safety upon discharge, and to decrease falls risk.       Follow Up Recommendations SNF    Equipment Recommendations  None recommended by PT    Recommendations for Other Services       Precautions / Restrictions Precautions Precautions: None Restrictions Weight Bearing Restrictions: No      Mobility  Bed Mobility Overal bed mobility: +2 for physical assistance;Needs Assistance Bed Mobility: Rolling;Sidelying to Sit Rolling: Total assist;+2 for physical assistance (Requires two NA for rolling due to weakness. ) Sidelying to sit:  (unable to perform after rolling in bed with NA for bath. )          Transfers  General transfer comment: inappropriate at this time, as patient does not have propper equipment to perform at prior level.   Ambulation/Gait Ambulation/Gait assistance:  (does not ambulate. )              Stairs            Wheelchair Mobility    Modified Rankin (Stroke Patients Only)       Balance                                             Pertinent Vitals/Pain Pain Assessment: 0-10 Pain  Score: 5  Pain Location: RLE chronic thigh pain from a remote orthopedic problem.  Pain Descriptors / Indicators: Aching Pain Intervention(s): Limited activity within patient's tolerance;Premedicated before session    Home Living Family/patient expects to be discharged to:: Skilled nursing facility Living Arrangements: Spouse/significant other Available Help at Discharge: Family Type of Home: House Home Access: Ramped entrance     Home Layout: One level Home Equipment: Wheelchair - power;Hospital bed;Other (comment) (bariatric hospital bed, slide board, bed side commode, hoyer lift, and trapeze. )      Prior Function Level of Independence: Needs assistance   Gait / Transfers Assistance Needed: Wife reports pt previously able to roll in bed indep; performs supine to/from sitting at max assist, slide baord transfers at max assist, has not ambulated or weight borne in at least 2 years per wife.   ADL's / Homemaking Assistance Needed: ablle to self feed; requires max assist for bathing and dressing related to funcitonal limitations on RUE and RLE. Can self feed. Toilets on BSC c slide board transfer (maxA)         Hand Dominance   Dominant Hand: Right    Extremity/Trunk Assessment   Upper Extremity Assessment: Defer to OT evaluation;RUE deficits/detail;Generalized weakness RUE Deficits / Details: Impaired function and pain at baseline.          Lower Extremity Assessment: Generalized weakness;RLE deficits/detail RLE Deficits / Details: chronic R hip pain, and knee extention contracture.        Communication   Communication: Expressive difficulties (AMS improving but still causing delayed response and processing time. )  Cognition Arousal/Alertness:  (Seems mildly drowsy) Behavior During Therapy: WFL for tasks assessed/performed Overall Cognitive Status: Impaired/Different from baseline Area of Impairment: Orientation;Attention Orientation Level: Person;Place Current  Attention Level: Sustained;Selective;Divided           General Comments: delayed processing.     General Comments      Exercises        Assessment/Plan    PT Assessment Patient needs continued PT services  PT Diagnosis Generalized weakness;Altered mental status   PT Problem List Decreased strength;Decreased activity tolerance;Decreased mobility;Decreased cognition;Obesity  PT Treatment Interventions Functional mobility training;Therapeutic exercise;Therapeutic activities;Patient/family education   PT Goals (Current goals can be found in the Care Plan section) Acute Rehab PT Goals Patient Stated Goal: regain strength and gradually improve functional mobility tolerance. Pt would like to walk again some day.  PT Goal Formulation: With patient/family Time For Goal Achievement: 08/13/15 Potential to Achieve Goals: Fair    Frequency Min 3X/week   Barriers to discharge Other (comment) significant decrease in independence with mobility.     Co-evaluation               End of Session   Activity Tolerance: Patient limited by lethargy;Patient  limited by fatigue Patient left: in bed;with family/visitor present;with bed alarm set;with call bell/phone within reach Nurse Communication: Mobility status;Precautions;Weight bearing status;Other (comment);Need for lift equipment         Time: 1322-1349 PT Time Calculation (min) (ACUTE ONLY): 27 min   Charges:   PT Evaluation $Initial PT Evaluation Tier I: 1 Procedure     PT G Codes:        Buccola,Allan C 08-14-15, 2:43 PM  2:53 PM  Etta Grandchild, PT, DPT [place] License # [id number]

## 2015-07-30 NOTE — Plan of Care (Signed)
Problem: Acute Rehab PT Goals(only PT should resolve) Goal: Pt will Roll Supine to Side Pt will demonstrate minA rolling in bed from supine to Left and/or Right.  Goal: Pt Will Go Supine/Side To Sit Pt will demonstrate MaxA bed mobility from supine to sitting EOB to demonstrate return of PLOF.

## 2015-07-31 DIAGNOSIS — I10 Essential (primary) hypertension: Secondary | ICD-10-CM

## 2015-07-31 DIAGNOSIS — K219 Gastro-esophageal reflux disease without esophagitis: Secondary | ICD-10-CM

## 2015-07-31 MED ORDER — CHLORHEXIDINE GLUCONATE CLOTH 2 % EX PADS
6.0000 | MEDICATED_PAD | Freq: Every day | CUTANEOUS | Status: DC
  Administered 2015-07-31: 6 via TOPICAL

## 2015-07-31 MED ORDER — CIPROFLOXACIN HCL 250 MG PO TABS: 250.0000 mg | ORAL_TABLET | Freq: Two times a day (BID) | ORAL | Status: DC

## 2015-07-31 MED ORDER — FENTANYL 50 MCG/HR TD PT72: 50.0000 ug | MEDICATED_PATCH | TRANSDERMAL | Status: DC

## 2015-07-31 MED ORDER — MUPIROCIN 2 % EX OINT
1.0000 "application " | TOPICAL_OINTMENT | Freq: Two times a day (BID) | CUTANEOUS | Status: DC
  Administered 2015-07-31: 1 via NASAL
  Filled 2015-07-31: qty 22

## 2015-07-31 NOTE — Care Management Important Message (Signed)
Important Message  Patient Details  Name: Bryce Berry MRN: [id number] Date of Birth: 20-Aug-1940   Medicare Important Message Given:  Yes    Joylene Draft, RN 07/31/2015, 1:52 PM

## 2015-07-31 NOTE — Clinical Social Work Note (Signed)
Clinical Social Work Assessment  Patient Details  Name: Bryce Berry MRN: [id number] Date of Birth: [birth year]  Date of referral:  07/31/15               Reason for consult:  Facility Placement                Permission sought to share information with:    Permission granted to share information::     Name::        Agency::     Relationship::     Contact Information:     Housing/Transportation Living arrangements for the past 2 months:  Single Family Home Source of Information:  Patient, Spouse Patient Interpreter Needed:  None Criminal Activity/Legal Involvement Pertinent to Current Situation/Hospitalization:  No - Comment as needed Significant Relationships:  Adult Children, Spouse, Other Family Members Lives with:  Spouse Do you feel safe going back to the place where you live?  Yes Need for family participation in patient care:  Yes (Comment)  Care giving concerns: Patient has a CNA that comes five days per week for two hours.     Social Worker assessment / plan:  Patient and wife provided history. They advised that they reside together in their home.  It was indicated that patient uses a wheelchair and sliding board to transfer.  Mrs. Duel stated that patient has had multiple hip replacements, a knee replacement, severe arthritis and rotator cuff issues.  She advised that patient has had rehab in the past at [facility].  CSW discussed the PT SNF recommendation.  Patient was apprehensive about accepting SNF. (Patient case manager spoke with patient and wife on yesterday about Medicare's three day requirement).  CSW provided a SNF list to the family.  Mrs. Brook advised that they were interested in [facility] [place] or [facility].  CSW spoke with patient's attending who advised that patient was being discharged today as he was medically ready.   Employment status:  Retired Forensic scientist:  Medicare PT Recommendations:  [place] / Referral to community resources:  [place]  Patient/Family's Response to care:  Patient is apprehensive about SNF. He does not meet the required Medicare three day stay.   Patient/Family's Understanding of and Emotional Response to Diagnosis, Current Treatment, and Prognosis:  Patient and wife appear to understand patient's diagnosis, treatment and prognosis.    Emotional Assessment Appearance:  Appears stated age Attitude/Demeanor/Rapport:  Apprehensive Affect (typically observed):  Calm Orientation:  Oriented to Situation, Oriented to  Time, Oriented to Place, Oriented to Self Alcohol / Substance use:  Not Applicable Psych involvement (Current and /or in the community):  No (Comment)  Discharge Needs  Concerns to be addressed:  Discharge Planning Concerns Readmission within the last 30 days:  No Current discharge risk:  Dependent with Mobility Barriers to Discharge:  No Barriers Identified   Ihor Gully, LCSW 07/31/2015, 12:19 PM [phone]

## 2015-07-31 NOTE — Care Management Note (Signed)
Case Management Note  Patient Details  Name: JAKYREN MCCLENDON MRN: [id number] Date of Birth: Feb 28, 1941  Subjective/Objective:                    Action/Plan:   Expected Discharge Date:                  Expected Discharge Plan:  [place]  In-House Referral:  Clinical Social Work  Discharge planning Services  CM Consult  Post Acute Care Choice:  Resumption of Svcs/PTA Provider Choice offered to:  Patient, Spouse  DME Arranged:    DME Agency:     HH Arranged:  RN, PT, OT HH Agency:  [place]  Status of Service:  Completed, signed off  Medicare Important Message Given:  Yes Date Medicare IM Given:    Medicare IM give by:    Date Additional Medicare IM Given:    Additional Medicare Important Message give by:     If discussed at [place] of Stay Meetings, dates discussed:    Additional Comments: Pt discharged home today with resumption of [facility] RN, PT and OT (per pt choice). Romualdo Bolk of [facility] is aware and will collect the pts information from the chart. [place] services to start within 48 hours of discharge. No new DME needs noted. Pt and pts nurse aware of discharge arrangements. Christinia Gully [place], RN 07/31/2015, 1:52 PM

## 2015-07-31 NOTE — Discharge Summary (Signed)
Physician Discharge Summary  ABRAHIM SARGENT [id number] DOB: Dec 19, 1940 DOA: 07/29/2015  PCP: Glenda Chroman., MD  Admit date: 07/29/2015 Discharge date: 07/31/2015  Time spent: 45 minutes  Recommendations for Outpatient Follow-up:  -We'll be discharged home today. -Advised to follow-up with primary care provider in 2 weeks.  -We'll need to follow final results of urine culture to make sure that appropriate antibiotic selection was made.  Discharge Diagnoses:  Principal Problem:   Acute encephalopathy Active Problems:   Hyperlipidemia   Essential hypertension   Peripheral vascular disease (HCC)   Atrial fibrillation (HCC)   GERD (gastroesophageal reflux disease)   Gout   Right leg DVT (HCC)   Bilateral pulmonary embolism (HCC)   UTI (lower urinary tract infection)   Generalized weakness   Discharge Condition: Stable and improved  Filed Weights   07/29/15 1310  Weight: 136.079 kg (300 lb)    History of present illness:  As per Dr. Waldron Labs on 12/13: Jonnie Truxillo is a 74 y.o. male,with history of chronic anticoagulation with xarelto secondary to chronic a-fib/a-flutter and bilateral PE, CAD s/p 4 stents, diverticulosis s/p partial sigmoidectomy ,internal hemorrhoids, COPD, gout, recurrent UTI who presents with generalized weakness and altered mental status over the last 3 weeks, bedside gives history, patient was recently hospitalized twice at [facility] over last 3 weeks for similar complaints, was told he was diagnosed with UTI, treated with IV antibiotics, as not recall the name of antibiotic or has any discharge paperwork with her currently, reports he had extensive workup, but can't recall what type of test exactly, NAD workup was significant for positive urinalysis, and mild hypokalemia, otherwise no significant labs abnormalities, she denies any chest pain, shortness of breath, fever, chills, coffee-ground emesis, diarrhea, no focal deficits, tingling or  numbness, aphasia or facial droop , patient had chronic right-sided weakness secondary to pain and right shoulder and right knee ,patient was started on IV Cipro in ED , I was called to admit.   Hospital Course:   Generalized weakness -B-12/TSH within normal limits. -UTI as well as narcotics are believed to be playing a role in his weakness. -Has been evaluated by physical therapy with recommendations for SNF, however he has not met a 3 night qualifying stay, and as such will be discharged home and will maximize home health physical therapy.  Gram-negative rod UTI -Continue Cipro for 5 more days.  Chronic pain syndrome -Fentanyl patch dose has been reduced from 125-50 g given concerns that this may be playing a role in his weakness and encephalopathy.  Acute encephalopathy -Appears resolved, wife agrees.  History of PE and DVT -Continue Xarelto  Atrial fibrillation -Rate controlled on metoprolol and anticoagulated on Xarelto.  Hyperlipidemia -Continue statin  Hypertension -Well controlled, continue current medications.   Procedures:  None   Consultations:  None  Discharge Instructions      Discharge Instructions    Diet - low sodium heart healthy    Complete by:  As directed      Increase activity slowly    Complete by:  As directed             Medication List    STOP taking these medications        fentaNYL 100 MCG/HR  Commonly known as:  DURAGESIC - dosed mcg/hr  Replaced by:  fentaNYL 50 MCG/HR     fentaNYL 25 MCG/HR patch  Commonly known as:  DURAGESIC - dosed mcg/hr      TAKE these medications  allopurinol 100 MG tablet  Commonly known as:  ZYLOPRIM  Take 100 mg by mouth daily.     ascorbic acid 500 MG tablet  Commonly known as:  VITAMIN C  Take 500 mg by mouth daily.     atorvastatin 80 MG tablet  Commonly known as:  LIPITOR  Take 80 mg by mouth daily.     calcium-vitamin D 500-200 MG-UNIT tablet  Commonly known as:  OSCAL  WITH D  Take 1 tablet by mouth daily.     cilostazol 100 MG tablet  Commonly known as:  PLETAL  Take 1 tablet by mouth daily.     ciprofloxacin 250 MG tablet  Commonly known as:  CIPRO  Take 1 tablet (250 mg total) by mouth 2 (two) times daily.     COLCRYS 0.6 MG tablet  Generic drug:  colchicine  Take 1 tablet by mouth daily as needed.     diclofenac sodium 1 % Gel  Commonly known as:  VOLTAREN  Apply 1 application topically daily.     diltiazem 120 MG 24 hr capsule  Commonly known as:  CARDIZEM CD  Take 1 capsule (120 mg total) by mouth daily. As directed based off blood pressure readings     fenofibrate 145 MG tablet  Commonly known as:  TRICOR  Take 1 tablet (145 mg total) by mouth daily.     fentaNYL 50 MCG/HR  Commonly known as:  DURAGESIC - dosed mcg/hr  Place 1 patch (50 mcg total) onto the skin every 3 (three) days.     finasteride 5 MG tablet  Commonly known as:  PROSCAR  Take 5 mg by mouth daily.     Fish Oil 1000 MG Caps  Take 2 capsules by mouth daily.     furosemide 20 MG tablet  Commonly known as:  LASIX  Take 2 tablets (40 mg total) by mouth daily.     HYDROcodone-acetaminophen 5-325 MG tablet  Commonly known as:  NORCO/VICODIN  Take 1-2 tablets by mouth every 6 (six) hours as needed for moderate pain or severe pain.     magnesium oxide 400 (241.3 MG) MG tablet  Commonly known as:  MAG-OX  Take 1 tablet (400 mg total) by mouth 2 (two) times daily.     metoCLOPramide 10 MG tablet  Commonly known as:  REGLAN  Take 1 tablet (10 mg total) by mouth daily.     metoprolol 100 MG tablet  Commonly known as:  LOPRESSOR  Take 1 tablet (100 mg total) by mouth 2 (two) times daily.     multivitamin tablet  Take 1 tablet by mouth daily.     MYLANTA 200-200-20 MG/5ML suspension  Generic drug:  alum & mag hydroxide-simeth  Take 15 mLs by mouth as needed for indigestion.     nitroGLYCERIN 0.4 MG SL tablet  Commonly known as:  NITROSTAT  Place 1 tablet  (0.4 mg total) under the tongue every 5 (five) minutes x 3 doses as needed.     pantoprazole 40 MG tablet  Commonly known as:  PROTONIX  Take 40 mg by mouth daily.     potassium chloride SA 20 MEQ tablet  Commonly known as:  K-DUR,KLOR-CON  Take 2 tablets (40 mEq total) by mouth daily.     psyllium 28 % packet  Commonly known as:  METAMUCIL SMOOTH TEXTURE  Take 1 packet by mouth at bedtime.     RA IRON 27 MG Tabs  Generic drug:  Ferrous Sulfate  Take 1 tablet by mouth daily.     rivaroxaban 20 MG Tabs tablet  Commonly known as:  XARELTO  Take 1 tablet (20 mg total) by mouth daily.     tamsulosin 0.4 MG Caps capsule  Commonly known as:  FLOMAX  Take 0.4 mg by mouth daily after supper.       Allergies  Allergen Reactions  . Iodine     REACTION: rash  . Omeprazole   . Other     Paper tape  . Penicillins     Rapid heart rate; flushed  . Sulfonamide Derivatives     Rapid heart rate; flushed   Follow-up Information    Follow up with [facility].   Contact information:   [address] [place] [address] [phone]       Follow up with VYAS,DHRUV B., MD. Schedule an appointment as soon as possible for a visit in 2 weeks.   Specialty:  Internal Medicine   Contact information:   [place] [address] [phone]        The results of significant diagnostics from this hospitalization (including imaging, microbiology, ancillary and laboratory) are listed below for reference.    Significant Diagnostic Studies: Dg Chest 1 View  07/29/2015  CLINICAL DATA:  Shoulder region pain. EXAM: CHEST 1 VIEW COMPARISON:  July 12, 2015 FINDINGS: There is no edema or consolidation. Heart is enlarged with pulmonary vascularity within normal limits. No adenopathy. Prominence the right peritracheal region is felt to represent great vessel prominence in this age group. There is extensive arthropathy in both shoulders. There is atherosclerotic  calcification in the aortic arch region. No pneumothorax. IMPRESSION: Stable cardiomegaly. Prominence the right peritracheal region is likely due to great vessel prominence. No edema or consolidation. Advanced arthropathy noted in both shoulders. Electronically Signed   By: Lowella Grip III M.D.   On: 07/29/2015 14:48   Ct Head Wo Contrast  07/29/2015  CLINICAL DATA:  Altered mental status.  Recent UTI.  Shoulder pain. EXAM: CT HEAD WITHOUT CONTRAST TECHNIQUE: Contiguous axial images were obtained from the base of the skull through the vertex without intravenous contrast. COMPARISON:  07/12/2015, 09/23/2014 FINDINGS: There is no evidence of mass effect, midline shift, or extra-axial fluid collections. There is no evidence of a space-occupying lesion or intracranial hemorrhage. There is no evidence of a cortical-based area of acute infarction. There is generalized cerebral atrophy. There is periventricular white matter low attenuation likely secondary to microangiopathy. The ventricles and sulci are appropriate for the patient's age. The basal cisterns are patent. Visualized portions of the orbits are unremarkable. The visualized portions of the paranasal sinuses and mastoid air cells are unremarkable. Cerebrovascular atherosclerotic calcifications are noted. The osseous structures are unremarkable. IMPRESSION: 1. No acute intracranial pathology. 2. Chronic microvascular disease and cerebral atrophy. Electronically Signed   By: Kathreen Devoid   On: 07/29/2015 14:58    Microbiology: Recent Results (from the past 240 hour(s))  Urine culture     Status: None (Preliminary result)   Collection Time: 07/29/15  2:50 PM  Result Value Ref Range Status   Specimen Description URINE, CATHETERIZED  Final   Special Requests NONE  Final   Culture   Final    >=100,000 COLONIES/mL GRAM NEGATIVE RODS Performed at [facility]    Report Status PENDING  Incomplete  MRSA PCR Screening     Status: Abnormal    Collection Time: 07/30/15  7:40 PM  Result  Value Ref Range Status   MRSA by PCR POSITIVE (A) NEGATIVE Final    Comment:        The GeneXpert MRSA Assay (FDA approved for NASAL specimens only), is one component of a comprehensive MRSA colonization surveillance program. It is not intended to diagnose MRSA infection nor to guide or monitor treatment for MRSA infections. RESULT CALLED TO, READ BACK BY AND VERIFIED WITH: LEWIS D AT 2323 ON [id number] BY FORSYTH K      Labs: Basic Metabolic Panel:  Recent Labs Lab 07/29/15 1326 07/30/15 0634  NA 141 140  K 3.2* 3.6  CL 102 107  CO2 31 27  GLUCOSE 111* 126*  BUN 11 10  CREATININE 0.78 0.74  CALCIUM 9.8 9.4   Liver Function Tests:  Recent Labs Lab 07/29/15 1326  AST 21  ALT 19  ALKPHOS 60  BILITOT 0.7  PROT 6.3*  ALBUMIN 3.2*   No results for input(s): LIPASE, AMYLASE in the last 168 hours.  Recent Labs Lab 07/29/15 1632  AMMONIA 18   CBC:  Recent Labs Lab 07/29/15 1326 07/30/15 0634  WBC 7.0 6.6  NEUTROABS 4.8  --   HGB 13.7 12.9*  HCT 42.1 40.1  MCV 94.2 94.4  PLT 149* 151   Cardiac Enzymes:  Recent Labs Lab 07/29/15 1326  TROPONINI <0.03   BNP: BNP (last 3 results) No results for input(s): BNP in the last 8760 hours.  ProBNP (last 3 results) No results for input(s): PROBNP in the last 8760 hours.  CBG: No results for input(s): GLUCAP in the last 168 hours.     SignedLelon Frohlich  [facility] Pager: [phone] 07/31/2015, 1:54 PM

## 2015-07-31 NOTE — Clinical Social Work Note (Signed)
CSW arranged transport via [facility]. Verified address and that family would be present upon arrival.  Bryce Berry, [place]

## 2015-08-01 NOTE — Progress Notes (Signed)
Patient left unit via EMS staff in route to home. Patient's wife was updated on departure.Patient was in stable condition when leaving unit.

## 2015-08-04 DIAGNOSIS — I129 Hypertensive chronic kidney disease with stage 1 through stage 4 chronic kidney disease, or unspecified chronic kidney disease: Secondary | ICD-10-CM | POA: Diagnosis not present

## 2015-08-04 DIAGNOSIS — I2510 Atherosclerotic heart disease of native coronary artery without angina pectoris: Secondary | ICD-10-CM | POA: Diagnosis not present

## 2015-08-04 DIAGNOSIS — I4891 Unspecified atrial fibrillation: Secondary | ICD-10-CM | POA: Diagnosis not present

## 2015-08-04 DIAGNOSIS — J449 Chronic obstructive pulmonary disease, unspecified: Secondary | ICD-10-CM | POA: Diagnosis not present

## 2015-08-04 DIAGNOSIS — N189 Chronic kidney disease, unspecified: Secondary | ICD-10-CM | POA: Diagnosis not present

## 2015-08-04 DIAGNOSIS — N390 Urinary tract infection, site not specified: Secondary | ICD-10-CM | POA: Diagnosis not present

## 2015-08-04 LAB — URINE CULTURE: Culture: >=100000

## 2015-08-07 DIAGNOSIS — N189 Chronic kidney disease, unspecified: Secondary | ICD-10-CM | POA: Diagnosis not present

## 2015-08-07 DIAGNOSIS — I4891 Unspecified atrial fibrillation: Secondary | ICD-10-CM | POA: Diagnosis not present

## 2015-08-07 DIAGNOSIS — I2510 Atherosclerotic heart disease of native coronary artery without angina pectoris: Secondary | ICD-10-CM | POA: Diagnosis not present

## 2015-08-07 DIAGNOSIS — J449 Chronic obstructive pulmonary disease, unspecified: Secondary | ICD-10-CM | POA: Diagnosis not present

## 2015-08-07 DIAGNOSIS — I129 Hypertensive chronic kidney disease with stage 1 through stage 4 chronic kidney disease, or unspecified chronic kidney disease: Secondary | ICD-10-CM | POA: Diagnosis not present

## 2015-08-07 DIAGNOSIS — N390 Urinary tract infection, site not specified: Secondary | ICD-10-CM | POA: Diagnosis not present

## 2015-08-12 DIAGNOSIS — I2510 Atherosclerotic heart disease of native coronary artery without angina pectoris: Secondary | ICD-10-CM | POA: Diagnosis not present

## 2015-08-12 DIAGNOSIS — J449 Chronic obstructive pulmonary disease, unspecified: Secondary | ICD-10-CM | POA: Diagnosis not present

## 2015-08-12 DIAGNOSIS — I129 Hypertensive chronic kidney disease with stage 1 through stage 4 chronic kidney disease, or unspecified chronic kidney disease: Secondary | ICD-10-CM | POA: Diagnosis not present

## 2015-08-12 DIAGNOSIS — N390 Urinary tract infection, site not specified: Secondary | ICD-10-CM | POA: Diagnosis not present

## 2015-08-12 DIAGNOSIS — N189 Chronic kidney disease, unspecified: Secondary | ICD-10-CM | POA: Diagnosis not present

## 2015-08-12 DIAGNOSIS — I4891 Unspecified atrial fibrillation: Secondary | ICD-10-CM | POA: Diagnosis not present

## 2015-08-14 DIAGNOSIS — N189 Chronic kidney disease, unspecified: Secondary | ICD-10-CM | POA: Diagnosis not present

## 2015-08-14 DIAGNOSIS — J449 Chronic obstructive pulmonary disease, unspecified: Secondary | ICD-10-CM | POA: Diagnosis not present

## 2015-08-14 DIAGNOSIS — N390 Urinary tract infection, site not specified: Secondary | ICD-10-CM | POA: Diagnosis not present

## 2015-08-14 DIAGNOSIS — I129 Hypertensive chronic kidney disease with stage 1 through stage 4 chronic kidney disease, or unspecified chronic kidney disease: Secondary | ICD-10-CM | POA: Diagnosis not present

## 2015-08-14 DIAGNOSIS — I2510 Atherosclerotic heart disease of native coronary artery without angina pectoris: Secondary | ICD-10-CM | POA: Diagnosis not present

## 2015-08-14 DIAGNOSIS — I4891 Unspecified atrial fibrillation: Secondary | ICD-10-CM | POA: Diagnosis not present

## 2015-08-18 DIAGNOSIS — I129 Hypertensive chronic kidney disease with stage 1 through stage 4 chronic kidney disease, or unspecified chronic kidney disease: Secondary | ICD-10-CM | POA: Diagnosis not present

## 2015-08-18 DIAGNOSIS — J449 Chronic obstructive pulmonary disease, unspecified: Secondary | ICD-10-CM | POA: Diagnosis not present

## 2015-08-18 DIAGNOSIS — I4891 Unspecified atrial fibrillation: Secondary | ICD-10-CM | POA: Diagnosis not present

## 2015-08-18 DIAGNOSIS — I2510 Atherosclerotic heart disease of native coronary artery without angina pectoris: Secondary | ICD-10-CM | POA: Diagnosis not present

## 2015-08-18 DIAGNOSIS — N390 Urinary tract infection, site not specified: Secondary | ICD-10-CM | POA: Diagnosis not present

## 2015-08-18 DIAGNOSIS — N189 Chronic kidney disease, unspecified: Secondary | ICD-10-CM | POA: Diagnosis not present

## 2015-08-20 DIAGNOSIS — M1990 Unspecified osteoarthritis, unspecified site: Secondary | ICD-10-CM | POA: Diagnosis not present

## 2015-08-20 DIAGNOSIS — Z79899 Other long term (current) drug therapy: Secondary | ICD-10-CM | POA: Diagnosis not present

## 2015-08-20 DIAGNOSIS — Z418 Encounter for other procedures for purposes other than remedying health state: Secondary | ICD-10-CM | POA: Diagnosis not present

## 2015-08-20 DIAGNOSIS — I10 Essential (primary) hypertension: Secondary | ICD-10-CM | POA: Diagnosis not present

## 2015-08-20 DIAGNOSIS — J449 Chronic obstructive pulmonary disease, unspecified: Secondary | ICD-10-CM | POA: Diagnosis not present

## 2015-08-20 DIAGNOSIS — N390 Urinary tract infection, site not specified: Secondary | ICD-10-CM | POA: Diagnosis not present

## 2015-08-21 DIAGNOSIS — J449 Chronic obstructive pulmonary disease, unspecified: Secondary | ICD-10-CM | POA: Diagnosis not present

## 2015-08-21 DIAGNOSIS — N390 Urinary tract infection, site not specified: Secondary | ICD-10-CM | POA: Diagnosis not present

## 2015-08-21 DIAGNOSIS — I4891 Unspecified atrial fibrillation: Secondary | ICD-10-CM | POA: Diagnosis not present

## 2015-08-21 DIAGNOSIS — N189 Chronic kidney disease, unspecified: Secondary | ICD-10-CM | POA: Diagnosis not present

## 2015-08-21 DIAGNOSIS — I2510 Atherosclerotic heart disease of native coronary artery without angina pectoris: Secondary | ICD-10-CM | POA: Diagnosis not present

## 2015-08-21 DIAGNOSIS — I129 Hypertensive chronic kidney disease with stage 1 through stage 4 chronic kidney disease, or unspecified chronic kidney disease: Secondary | ICD-10-CM | POA: Diagnosis not present

## 2015-08-26 DIAGNOSIS — I129 Hypertensive chronic kidney disease with stage 1 through stage 4 chronic kidney disease, or unspecified chronic kidney disease: Secondary | ICD-10-CM | POA: Diagnosis not present

## 2015-08-26 DIAGNOSIS — J449 Chronic obstructive pulmonary disease, unspecified: Secondary | ICD-10-CM | POA: Diagnosis not present

## 2015-08-26 DIAGNOSIS — I2510 Atherosclerotic heart disease of native coronary artery without angina pectoris: Secondary | ICD-10-CM | POA: Diagnosis not present

## 2015-08-26 DIAGNOSIS — N390 Urinary tract infection, site not specified: Secondary | ICD-10-CM | POA: Diagnosis not present

## 2015-08-26 DIAGNOSIS — I4891 Unspecified atrial fibrillation: Secondary | ICD-10-CM | POA: Diagnosis not present

## 2015-08-26 DIAGNOSIS — N189 Chronic kidney disease, unspecified: Secondary | ICD-10-CM | POA: Diagnosis not present

## 2015-08-28 DIAGNOSIS — I2510 Atherosclerotic heart disease of native coronary artery without angina pectoris: Secondary | ICD-10-CM | POA: Diagnosis not present

## 2015-08-28 DIAGNOSIS — N390 Urinary tract infection, site not specified: Secondary | ICD-10-CM | POA: Diagnosis not present

## 2015-08-28 DIAGNOSIS — N189 Chronic kidney disease, unspecified: Secondary | ICD-10-CM | POA: Diagnosis not present

## 2015-08-28 DIAGNOSIS — I129 Hypertensive chronic kidney disease with stage 1 through stage 4 chronic kidney disease, or unspecified chronic kidney disease: Secondary | ICD-10-CM | POA: Diagnosis not present

## 2015-08-28 DIAGNOSIS — I4891 Unspecified atrial fibrillation: Secondary | ICD-10-CM | POA: Diagnosis not present

## 2015-08-28 DIAGNOSIS — J449 Chronic obstructive pulmonary disease, unspecified: Secondary | ICD-10-CM | POA: Diagnosis not present

## 2015-08-29 DIAGNOSIS — I4891 Unspecified atrial fibrillation: Secondary | ICD-10-CM | POA: Diagnosis not present

## 2015-08-29 DIAGNOSIS — I2510 Atherosclerotic heart disease of native coronary artery without angina pectoris: Secondary | ICD-10-CM | POA: Diagnosis not present

## 2015-08-29 DIAGNOSIS — I129 Hypertensive chronic kidney disease with stage 1 through stage 4 chronic kidney disease, or unspecified chronic kidney disease: Secondary | ICD-10-CM | POA: Diagnosis not present

## 2015-08-29 DIAGNOSIS — N390 Urinary tract infection, site not specified: Secondary | ICD-10-CM | POA: Diagnosis not present

## 2015-08-29 DIAGNOSIS — J449 Chronic obstructive pulmonary disease, unspecified: Secondary | ICD-10-CM | POA: Diagnosis not present

## 2015-08-29 DIAGNOSIS — N189 Chronic kidney disease, unspecified: Secondary | ICD-10-CM | POA: Diagnosis not present

## 2015-09-01 ENCOUNTER — Encounter: Payer: Self-pay | Admitting: Neurology

## 2015-09-01 ENCOUNTER — Ambulatory Visit (INDEPENDENT_AMBULATORY_CARE_PROVIDER_SITE_OTHER): Payer: Medicare Other | Admitting: Neurology

## 2015-09-01 VITALS — BP 128/86 | HR 100 | Temp 98.2°F | Resp 20

## 2015-09-01 DIAGNOSIS — F0390 Unspecified dementia without behavioral disturbance: Secondary | ICD-10-CM

## 2015-09-01 DIAGNOSIS — F03A0 Unspecified dementia, mild, without behavioral disturbance, psychotic disturbance, mood disturbance, and anxiety: Secondary | ICD-10-CM | POA: Insufficient documentation

## 2015-09-01 MED ORDER — RIVASTIGMINE 4.6 MG/24HR TD PT24
4.6000 mg | MEDICATED_PATCH | Freq: Every day | TRANSDERMAL | Status: DC
Start: 2015-09-01 — End: 2015-10-06

## 2015-09-01 NOTE — Progress Notes (Signed)
NEUROLOGY CONSULTATION NOTE  NEBIYU GERHART MRN: [id number] DOB: Mar 02, 1941  Referring provider: Dr. Monico Blitz Primary care provider:Dr. Jerene Bears  Reason for consult:  Episodes of altered mental status  Dear Dr Manuella Ghazi:  Thank you for your kind referral of Bryce Berry for consultation of the above symptoms. Although his history is well known to you, please allow me to reiterate it for the purpose of our medical record. The patient was accompanied to the clinic by his wife who also provides collateral information. Records and images were personally reviewed where available.  HISTORY OF PRESENT ILLNESS: This is a pleasant 75 year old right-handed man with multiple medical issues including hypertension, hyperlipidemia, chronic pain, obesity, atrial fibrillation, PE and DVT on chronic anticoagulation, presenting for confusional episodes. Records available from [facility] were reviewed. He was previously admitted for a UTI, then readmitted on 07/12/15 for intermittent confusion, his wife reported at times he is stable and understands everything, at other times he seems to be confused. CBC, CMP, urinalysis were unremarkable. He had a head CT which was reported as unremarkable, unable to get an MRI due to patient size. He then presented to [facility] on 07/29/15 for generalized weakness and confusion. It was felt that UTI, as well as narcotics are playing a role in his weakness. His Fentanyl patch dose was reduced. The patient reports that sometimes he may feel a little confused, with difficulties remembering dates, then "comes back pretty clear." His wife reports confusion when she is getting him ready for bed, she asks him to move to the right and it takes him a while then moves to the left. She has noticed difficulties with following instructions. No gibberish speech, staring/unresponsiveness, convulsive activity noted. She has noticed more of the symptoms at night, but other nights he is  "clear." She is his primary caregiver. He has been wheelchair-bound for the past 9 months for back, shoulder, and hip pain. He currently is having significant right shoulder pain, and some left shoulder pain. They deny any falls. He denies any olfactory/gustatory hallucinations, deja vu, rising epigastric sensation, myoclonic jerks.He has been told he has nerve damage in the right arm, but denies any numbness/tingling. He denies any headaches, dizziness, diplopia, dysarthria, dysphagia, bowel/bladder dysfunction. He had a normal birth and early development.  There is no history of febrile convulsions, CNS infections such as meningitis/encephalitis, significant traumatic brain injury, neurosurgical procedures, or family history of seizures.  I personally reviewed head CT done 07/29/15 which did not show any acute changes. There was generalized cerebral atrophy and chronic microvascular disease.  Laboratory Data: Lab Results  Component Value Date   WBC 6.6 07/30/2015   HGB 12.9* 07/30/2015   HCT 40.1 07/30/2015   MCV 94.4 07/30/2015   PLT 151 07/30/2015     Chemistry      Component Value Date/Time   NA 140 07/30/2015 0634   K 3.6 07/30/2015 0634   CL 107 07/30/2015 0634   CO2 27 07/30/2015 0634   BUN 10 07/30/2015 0634   CREATININE 0.74 07/30/2015 0634   CREATININE 0.73 01/02/2015 1506      Component Value Date/Time   CALCIUM 9.4 07/30/2015 0634   ALKPHOS 60 07/29/2015 1326   AST 21 07/29/2015 1326   ALT 19 07/29/2015 1326   BILITOT 0.7 07/29/2015 1326     Lab Results  Component Value Date   TSH 0.817 07/29/2015   Lab Results  Component Value Date   [id number] 07/29/2015  PAST MEDICAL HISTORY: Past Medical History  Diagnosis Date  . Atrial fibrillation ([place])     Postop hip surgery, January 2012, digoxin and diltiazem, reverted to sinus in the hospital  . Osteoarthritis   . COPD (chronic obstructive pulmonary disease) (HCC)     Emphysema  . Depression   .  Posttraumatic stress disorder   . GERD (gastroesophageal reflux disease)   . Irritable bowel   . BPH (benign prostatic hyperplasia)   . Urinary tract bacterial infections   . Osteoporosis   . Hypertension   . Dyslipidemia   . CAD (coronary artery disease)     Stents placed in [place] and [place] in the past  /Myoview 2008, no ischemia, /   . Atrial flutter ([place])     History of the past with syncope and  ?? H. and consideration, no records  . 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 Hip pain   . Shoulder pain   . Gout   . Peripheral arterial disease ([place])     Dopplers Dr.Vyas office October, 2010, apparent occlusion anterior tibial arteries bilaterally., findings suggest hemodynamically significant stenosis of the proximal right provisional femoral artery and the mid left superficial artery.  Ankle-brachial indices indicate moderate stenosis within both lower extremities  . Chronic kidney disease     Creatinine 2.1, October 13, 2010--> 0.74 10/2012  . Hyperlipidemia     Mixed  . GI bleed   . Ejection fraction     EF 50-55%, echo, May, 2013, severe inferior hypokinesis  . Bilateral pulmonary embolism ([place])     a. 10/2012 - V:Q scan high prob for Bilat PE in setting of dyspnea/hemoptysis-->anticoagulation started.  . Right leg DVT ([place])     a. 10/2012 - subocclusive DVT R popliteal vein.  Marland Kitchen COPD (chronic obstructive pulmonary disease) ([place])   . Chronic pain     bilat hips, right shoulder, RLE    PAST SURGICAL HISTORY: Past Surgical History  Procedure Laterality Date  . Knee arthroplasty      Right  . Total knee arthroplasty      Left  . Total hip arthroplasty      Right-complicated hx  . Colon resection    . Back surgery      [place]  . Prostate surgery    . Colonoscopy N/A 03/13/2013    Procedure: COLONOSCOPY;  Surgeon: Rogene Houston, MD;  Location: [facility];  Service: Endoscopy;  Laterality: N/A;  730  . Cataract extraction    . Colonoscopy N/A  12/22/2014    Procedure: COLONOSCOPY;  Surgeon: Rogene Houston, MD;  Location: [facility];  Service: Endoscopy;  Laterality: N/A;  . Givens capsule study N/A 12/24/2014    Procedure: GIVENS CAPSULE STUDY;  Surgeon: Rogene Houston, MD;  Location: [facility];  Service: Endoscopy;  Laterality: N/A;  . Coronary angioplasty with stent placement      x 4    MEDICATIONS: Current Outpatient Prescriptions on File Prior to Visit  Medication Sig Dispense Refill  . allopurinol (ZYLOPRIM) 100 MG tablet Take 100 mg by mouth daily.     Marland Kitchen alum & mag hydroxide-simeth (MYLANTA) [id number] MG/5ML suspension Take 15 mLs by mouth as needed for indigestion.     Marland Kitchen ascorbic acid (VITAMIN C) 500 MG tablet Take 500 mg by mouth daily.     Marland Kitchen atorvastatin (LIPITOR) 80 MG tablet Take 80 mg by mouth daily.    . calcium-vitamin D (OSCAL  WITH D) 500-200 MG-UNIT per tablet Take 1 tablet by mouth daily.     . cilostazol (PLETAL) 100 MG tablet Take 1 tablet by mouth daily.    Marland Kitchen COLCRYS 0.6 MG tablet Take 1 tablet by mouth daily as needed.    . diclofenac sodium (VOLTAREN) 1 % GEL Apply 1 application topically daily.    Marland Kitchen diltiazem (CARDIZEM CD) 120 MG 24 hr capsule Take 1 capsule (120 mg total) by mouth daily. As directed based off blood pressure readings 60 capsule 3  . fenofibrate (TRICOR) 145 MG tablet Take 1 tablet (145 mg total) by mouth daily. 30 tablet 6  . Ferrous Sulfate (RA IRON) 27 MG TABS Take 1 tablet by mouth daily.      . finasteride (PROSCAR) 5 MG tablet Take 5 mg by mouth daily.      Marland Kitchen HYDROcodone-acetaminophen (NORCO/VICODIN) 5-325 MG per tablet Take 1-2 tablets by mouth every 6 (six) hours as needed for moderate pain or severe pain.   0  . magnesium oxide (MAG-OX) 400 (241.3 MG) MG tablet Take 1 tablet (400 mg total) by mouth 2 (two) times daily. 60 tablet 2  . metoCLOPramide (REGLAN) 10 MG tablet Take 1 tablet (10 mg total) by mouth daily. 30 tablet 5  . metoprolol (LOPRESSOR) 100 MG tablet Take 1  tablet (100 mg total) by mouth 2 (two) times daily. 180 tablet 3  . Multiple Vitamin (MULTIVITAMIN) tablet Take 1 tablet by mouth daily.      . Omega-3 Fatty Acids (FISH OIL) 1000 MG CAPS Take 2 capsules by mouth daily.     . pantoprazole (PROTONIX) 40 MG tablet Take 40 mg by mouth daily.      . potassium chloride SA (K-DUR,KLOR-CON) 20 MEQ tablet Take 2 tablets (40 mEq total) by mouth daily. (Patient taking differently: Take 20 mEq by mouth daily. ) 30 tablet 0  . psyllium (METAMUCIL SMOOTH TEXTURE) 28 % packet Take 1 packet by mouth at bedtime. (Patient taking differently: Take 1 packet by mouth at bedtime as needed (bowels). )    . rivaroxaban (XARELTO) 20 MG TABS tablet Take 1 tablet (20 mg total) by mouth daily. 30 tablet 10  . tamsulosin (FLOMAX) 0.4 MG CAPS capsule Take 0.4 mg by mouth daily after supper.    . nitroGLYCERIN (NITROSTAT) 0.4 MG SL tablet Place 1 tablet (0.4 mg total) under the tongue every 5 (five) minutes x 3 doses as needed. (Patient not taking: Reported on 09/01/2015) 25 tablet 3  . [DISCONTINUED] solifenacin (VESICARE) 10 MG tablet Take 10 mg by mouth daily.      No current facility-administered medications on file prior to visit.    ALLERGIES: Allergies  Allergen Reactions  . Iodine     REACTION: rash  . Omeprazole   . Other     Paper tape  . Penicillins     Rapid heart rate; flushed  . Sulfonamide Derivatives     Rapid heart rate; flushed    FAMILY HISTORY: Family History  Problem Relation Age of Onset  . Diabetes Daughter   . Hypertension Daughter   . Obesity Daughter     SOCIAL HISTORY: Social History   Social History  . Marital Status: Married    Spouse Name: N/A  . Number of Children: N/A  . Years of Education: N/A   Occupational History  . Tour manager     Retired  . Vet     Retired   Social History Main Topics  .  Smoking status: Former Smoker -- 1.00 packs/day for 30 years    Types: Cigarettes    Quit date: 08/16/2006  .  Smokeless tobacco: Never Used     Comment: Patient smoked a pack a day for about 30 years  . Alcohol Use: No  . Drug Use: No  . Sexual Activity: Not on file   Other Topics Concern  . Not on file   Social History Narrative   Lives with wife at home.  Bedbound and mobilized wheelchair.  Two hours 5 days of week of HH aid.          REVIEW OF SYSTEMS: Constitutional: No fevers, chills, or sweats, no generalized fatigue, change in appetite Eyes: No visual changes, double vision, eye pain Ear, nose and throat: No hearing loss, ear pain, nasal congestion, sore throat Cardiovascular: No chest pain, palpitations Respiratory:  No shortness of breath at rest or with exertion, wheezes GastrointestinaI: No nausea, vomiting, diarrhea, abdominal pain, fecal incontinence Genitourinary:  No dysuria, urinary retention or frequency Musculoskeletal:  + neck pain, back pain Integumentary: No rash, pruritus, skin lesions Neurological: as above Psychiatric: No depression, insomnia, anxiety Endocrine: No palpitations, fatigue, diaphoresis, mood swings, change in appetite, change in weight, increased thirst Hematologic/Lymphatic:  No anemia, purpura, petechiae. Allergic/Immunologic: no itchy/runny eyes, nasal congestion, recent allergic reactions, rashes  PHYSICAL EXAM: Filed Vitals:   09/01/15 1414  BP: 128/86  Pulse: 100  Temp: 98.2 F (36.8 C)  Resp: 20   General: No acute distress Head:  Normocephalic/atraumatic Eyes: Fundoscopic exam shows bilateral sharp discs, no vessel changes, exudates, or hemorrhages Neck: supple, no paraspinal tenderness, full range of motion Back: No paraspinal tenderness Heart: regular rate and rhythm Lungs: Clear to auscultation bilaterally. Vascular: No carotid bruits. Skin/Extremities: No rash, no edema Neurological Exam: Mental status: alert and oriented to person, place, and time, no dysarthria or aphasia, Fund of knowledge is appropriate.  Remote memory  intact.  Attention and concentration are normal.    Able to name objects and repeat phrases.  MMSE - Mini Mental State Exam 09/01/2015  Orientation to time 4  Orientation to Place 4  Registration 3  Attention/ Calculation 1  Recall 0  Language- name 2 objects 2  Language- repeat 1  Language- follow 3 step command 2  Language- read & follow direction 1  Write a sentence 1  Copy design 1  Total score 20   Cranial nerves: CN I: not tested CN II: pupils equal, round and reactive to light, visual fields intact, fundi unremarkable. CN III, IV, VI:  full range of motion, no nystagmus, no ptosis CN V: decreased pin and cold on left V1-3 CN VII: upper and lower face symmetric CN VIII: hearing intact to finger rub CN IX, X: gag intact, uvula midline CN XI: sternocleidomastoid and trapezius muscles intact CN XII: tongue midline Bulk & Tone: normal, no fasciculations. Motor: 5/5 on left UE, difficulty lifting right arm above gravity due to pain, 5/5 distally, both LE 3/5 proximal, 4/5 distal Sensation: decreased pin and cold on left UE, intact to all modalities on both LE. No extinction to double simultaneous stimulation.  Deep Tendon Reflexes: +1 throughout except for absent ankle jerks bilaterally, no ankle clonus Plantar responses: downgoing bilaterally Cerebellar: no incoordination on finger to nose but with difficulty on right due to right shoulder pain Gait: not tested, wheelchair-bound Tremor: none  IMPRESSION: This is a pleasant 75 year old right-handed man with multiple medical issues including hypertension, hyperlipidemia, chronic pain,  obesity, atrial fibrillation, PE and DVT on chronic anticoagulation, presenting for confusional episodes. [place] expressed concern about possible seizures, however on further history, the confusion appears to be more related to memory changes rather than seizures. His head CT did not show any acute changes, there was generalized atrophy and chronic  microvascular disease. Neurological exam today shows right shoulder pain and bilateral lower extremity weakness that are chronic. MMSE today 20/30, indicating mild dementia, which is likely the cause of the confusional symptoms. Findings were discussed with his wife, including option to start cholinesterase inhibitors, although with his multiple medications, I am hesitant. His wife would like to go ahead and asks about the Exelon patch. He will start Exelon patch, side effects and expectations from the medication were discussed. We discussed the importance of control of vascular risk factors, physical exercise, and brain stimulation exercises for brain health. He will follow-up in 8 months and knows to call for any changes.   Thank you for allowing me to participate in the care of this patient. Please do not hesitate to call for any questions or concerns.   Ellouise Newer, M.D.  CC: Dr. Woody Seller

## 2015-09-01 NOTE — Patient Instructions (Signed)
1. Start Exelon patch 4.6mg /24 hr, apply once a day 2. Physical exercise and brain stimulation exercises are important for brain health 3. See your orthopedic doctor as scheduled  4. Follow-up in 8 months, call for any problems

## 2015-09-02 DIAGNOSIS — N189 Chronic kidney disease, unspecified: Secondary | ICD-10-CM | POA: Diagnosis not present

## 2015-09-02 DIAGNOSIS — I4891 Unspecified atrial fibrillation: Secondary | ICD-10-CM | POA: Diagnosis not present

## 2015-09-02 DIAGNOSIS — N390 Urinary tract infection, site not specified: Secondary | ICD-10-CM | POA: Diagnosis not present

## 2015-09-02 DIAGNOSIS — I2510 Atherosclerotic heart disease of native coronary artery without angina pectoris: Secondary | ICD-10-CM | POA: Diagnosis not present

## 2015-09-02 DIAGNOSIS — J449 Chronic obstructive pulmonary disease, unspecified: Secondary | ICD-10-CM | POA: Diagnosis not present

## 2015-09-02 DIAGNOSIS — I129 Hypertensive chronic kidney disease with stage 1 through stage 4 chronic kidney disease, or unspecified chronic kidney disease: Secondary | ICD-10-CM | POA: Diagnosis not present

## 2015-09-03 DIAGNOSIS — I2510 Atherosclerotic heart disease of native coronary artery without angina pectoris: Secondary | ICD-10-CM | POA: Diagnosis not present

## 2015-09-03 DIAGNOSIS — N390 Urinary tract infection, site not specified: Secondary | ICD-10-CM | POA: Diagnosis not present

## 2015-09-03 DIAGNOSIS — J449 Chronic obstructive pulmonary disease, unspecified: Secondary | ICD-10-CM | POA: Diagnosis not present

## 2015-09-03 DIAGNOSIS — I4891 Unspecified atrial fibrillation: Secondary | ICD-10-CM | POA: Diagnosis not present

## 2015-09-03 DIAGNOSIS — N189 Chronic kidney disease, unspecified: Secondary | ICD-10-CM | POA: Diagnosis not present

## 2015-09-03 DIAGNOSIS — I129 Hypertensive chronic kidney disease with stage 1 through stage 4 chronic kidney disease, or unspecified chronic kidney disease: Secondary | ICD-10-CM | POA: Diagnosis not present

## 2015-09-04 DIAGNOSIS — N390 Urinary tract infection, site not specified: Secondary | ICD-10-CM | POA: Diagnosis not present

## 2015-09-05 DIAGNOSIS — N189 Chronic kidney disease, unspecified: Secondary | ICD-10-CM | POA: Diagnosis not present

## 2015-09-05 DIAGNOSIS — I2510 Atherosclerotic heart disease of native coronary artery without angina pectoris: Secondary | ICD-10-CM | POA: Diagnosis not present

## 2015-09-05 DIAGNOSIS — J449 Chronic obstructive pulmonary disease, unspecified: Secondary | ICD-10-CM | POA: Diagnosis not present

## 2015-09-05 DIAGNOSIS — I4891 Unspecified atrial fibrillation: Secondary | ICD-10-CM | POA: Diagnosis not present

## 2015-09-05 DIAGNOSIS — I129 Hypertensive chronic kidney disease with stage 1 through stage 4 chronic kidney disease, or unspecified chronic kidney disease: Secondary | ICD-10-CM | POA: Diagnosis not present

## 2015-09-05 DIAGNOSIS — N390 Urinary tract infection, site not specified: Secondary | ICD-10-CM | POA: Diagnosis not present

## 2015-09-22 ENCOUNTER — Telehealth: Payer: Self-pay | Admitting: *Deleted

## 2015-09-22 MED ORDER — DILTIAZEM HCL ER COATED BEADS 120 MG PO CP24: 120.0000 mg | ORAL_CAPSULE | Freq: Every day | ORAL | Status: DC

## 2015-09-22 NOTE — Telephone Encounter (Signed)
Prescriptions sent to [facility]/Patient need office visit also.

## 2015-10-03 ENCOUNTER — Telehealth: Payer: Self-pay | Admitting: Neurology

## 2015-10-03 NOTE — Telephone Encounter (Signed)
I spoke with patient's wife. She states that she hasn't notice any difference since patient started on Exelon Patch. She wanted to know if you had any other recommendations.

## 2015-10-03 NOTE — Telephone Encounter (Signed)
Pt wife called and needs to talk to someone about medication please call Enid Derry at [phone]

## 2015-10-03 NOTE — Telephone Encounter (Signed)
Please let her know that Exelon is not a cure, patients do not start remembering things when they take the medication. It is more of a long-term medication to help slow down the progression of worsening memory. Thanks

## 2015-10-06 MED ORDER — RIVASTIGMINE 9.5 MG/24HR TD PT24: 9.5000 mg | MEDICATED_PATCH | Freq: Every day | TRANSDERMAL | Status: DC

## 2015-10-06 NOTE — Telephone Encounter (Signed)
Spoke with patients wife. Patient hasn't had any side effects on lower does of Exelon, will send in new Rx for higher dose.

## 2015-10-06 NOTE — Telephone Encounter (Signed)
Lmovm to rtn my call. 

## 2015-10-06 NOTE — Telephone Encounter (Signed)
Agree that it is a low dose, if not having any side effects, can increase to 9.5mg /24 hour patch. Pls call in new Rx for higher dose. Thanks

## 2015-10-06 NOTE — Telephone Encounter (Signed)
I spoke with Mrs. Bryce Berry again. She wanted to know your thoughts on patient not being on a high enough dose. She keeps stating that she's wondering if the dose he is on is too small for a man his size ( states pt is 300lbs).

## 2015-10-06 NOTE — Telephone Encounter (Signed)
PT called on Friday about medication and would like a call back/Dawn CB# [phone]

## 2015-10-24 ENCOUNTER — Encounter (HOSPITAL_COMMUNITY): Payer: Self-pay | Admitting: Cardiology

## 2015-10-24 ENCOUNTER — Emergency Department (HOSPITAL_COMMUNITY): Payer: Medicare Other

## 2015-10-24 ENCOUNTER — Emergency Department (HOSPITAL_COMMUNITY)
Admission: EM | Admit: 2015-10-24 | Discharge: 2015-10-24 | Disposition: A | Discharge status: Home / Self Care | Payer: Medicare Other | Attending: Emergency Medicine | Admitting: Emergency Medicine

## 2015-10-24 DIAGNOSIS — I2510 Atherosclerotic heart disease of native coronary artery without angina pectoris: Secondary | ICD-10-CM | POA: Diagnosis not present

## 2015-10-24 DIAGNOSIS — R279 Unspecified lack of coordination: Secondary | ICD-10-CM | POA: Diagnosis not present

## 2015-10-24 DIAGNOSIS — F329 Major depressive disorder, single episode, unspecified: Secondary | ICD-10-CM | POA: Insufficient documentation

## 2015-10-24 DIAGNOSIS — J189 Pneumonia, unspecified organism: Secondary | ICD-10-CM | POA: Diagnosis not present

## 2015-10-24 DIAGNOSIS — N390 Urinary tract infection, site not specified: Secondary | ICD-10-CM | POA: Diagnosis not present

## 2015-10-24 DIAGNOSIS — I4891 Unspecified atrial fibrillation: Secondary | ICD-10-CM | POA: Insufficient documentation

## 2015-10-24 DIAGNOSIS — Z791 Long term (current) use of non-steroidal anti-inflammatories (NSAID): Secondary | ICD-10-CM | POA: Insufficient documentation

## 2015-10-24 DIAGNOSIS — E785 Hyperlipidemia, unspecified: Secondary | ICD-10-CM | POA: Diagnosis not present

## 2015-10-24 DIAGNOSIS — R2242 Localized swelling, mass and lump, left lower limb: Secondary | ICD-10-CM | POA: Diagnosis not present

## 2015-10-24 DIAGNOSIS — R0602 Shortness of breath: Secondary | ICD-10-CM | POA: Diagnosis not present

## 2015-10-24 DIAGNOSIS — Z87891 Personal history of nicotine dependence: Secondary | ICD-10-CM | POA: Diagnosis not present

## 2015-10-24 DIAGNOSIS — N189 Chronic kidney disease, unspecified: Secondary | ICD-10-CM | POA: Diagnosis not present

## 2015-10-24 DIAGNOSIS — Z7401 Bed confinement status: Secondary | ICD-10-CM | POA: Diagnosis not present

## 2015-10-24 DIAGNOSIS — M1990 Unspecified osteoarthritis, unspecified site: Secondary | ICD-10-CM | POA: Insufficient documentation

## 2015-10-24 DIAGNOSIS — I739 Peripheral vascular disease, unspecified: Secondary | ICD-10-CM | POA: Diagnosis not present

## 2015-10-24 DIAGNOSIS — Z79899 Other long term (current) drug therapy: Secondary | ICD-10-CM | POA: Diagnosis not present

## 2015-10-24 DIAGNOSIS — I129 Hypertensive chronic kidney disease with stage 1 through stage 4 chronic kidney disease, or unspecified chronic kidney disease: Secondary | ICD-10-CM | POA: Insufficient documentation

## 2015-10-24 DIAGNOSIS — R069 Unspecified abnormalities of breathing: Secondary | ICD-10-CM | POA: Diagnosis not present

## 2015-10-24 DIAGNOSIS — J449 Chronic obstructive pulmonary disease, unspecified: Secondary | ICD-10-CM | POA: Insufficient documentation

## 2015-10-24 LAB — URINALYSIS, ROUTINE W REFLEX MICROSCOPIC
BILIRUBIN URINE: NEGATIVE
GLUCOSE, UA: NEGATIVE mg/dL
Ketones, ur: NEGATIVE mg/dL
Nitrite: NEGATIVE
PROTEIN: 30 mg/dL — AB
SPECIFIC GRAVITY, URINE: 1.01 (ref 1.005–1.030)
pH: 7.5 (ref 5.0–8.0)

## 2015-10-24 LAB — URINE MICROSCOPIC-ADD ON

## 2015-10-24 LAB — CBC WITH DIFFERENTIAL/PLATELET
Basophils Absolute: 0 10*3/uL (ref 0.0–0.1)
Basophils Relative: 1 %
Eosinophils Absolute: 0.4 10*3/uL (ref 0.0–0.7)
Eosinophils Relative: 6 %
HEMATOCRIT: 41.4 % (ref 39.0–52.0)
HEMOGLOBIN: 13.4 g/dL (ref 13.0–17.0)
LYMPHS ABS: 1.8 10*3/uL (ref 0.7–4.0)
LYMPHS PCT: 29 %
MCH: 30.1 pg (ref 26.0–34.0)
MCHC: 32.4 g/dL (ref 30.0–36.0)
MCV: 93 fL (ref 78.0–100.0)
MONOS PCT: 6 %
Monocytes Absolute: 0.4 10*3/uL (ref 0.1–1.0)
NEUTROS ABS: 3.8 10*3/uL (ref 1.7–7.7)
NEUTROS PCT: 58 %
Platelets: 163 10*3/uL (ref 150–400)
RBC: 4.45 MIL/uL (ref 4.22–5.81)
RDW: 16.8 % — ABNORMAL HIGH (ref 11.5–15.5)
WBC: 6.4 10*3/uL (ref 4.0–10.5)

## 2015-10-24 LAB — COMPREHENSIVE METABOLIC PANEL
ALK PHOS: 52 U/L (ref 38–126)
ALT: 19 U/L (ref 17–63)
AST: 36 U/L (ref 15–41)
Albumin: 3.1 g/dL — ABNORMAL LOW (ref 3.5–5.0)
Anion gap: 6 (ref 5–15)
BILIRUBIN TOTAL: 0.7 mg/dL (ref 0.3–1.2)
BUN: 9 mg/dL (ref 6–20)
CALCIUM: 10.1 mg/dL (ref 8.9–10.3)
CO2: 31 mmol/L (ref 22–32)
CREATININE: 0.82 mg/dL (ref 0.61–1.24)
Chloride: 103 mmol/L (ref 101–111)
GFR calc non Af Amer: >60 mL/min (ref >60)
Glucose, Bld: 118 mg/dL — ABNORMAL HIGH (ref 65–99)
Potassium: 3.5 mmol/L (ref 3.5–5.1)
SODIUM: 140 mmol/L (ref 135–145)
TOTAL PROTEIN: 6.4 g/dL — AB (ref 6.5–8.1)

## 2015-10-24 LAB — TROPONIN I: Troponin I: 0.03 ng/mL (ref <0.031)

## 2015-10-24 LAB — MAGNESIUM: Magnesium: 1.4 mg/dL — ABNORMAL LOW (ref 1.7–2.4)

## 2015-10-24 LAB — D-DIMER, QUANTITATIVE: D-Dimer, Quant: 0.58 ug/mL-FEU — ABNORMAL HIGH (ref 0.00–0.50)

## 2015-10-24 LAB — BRAIN NATRIURETIC PEPTIDE: B NATRIURETIC PEPTIDE 5: 25 pg/mL (ref 0.0–100.0)

## 2015-10-24 MED ORDER — HYDROCODONE-ACETAMINOPHEN 5-325 MG PO TABS
2.0000 | ORAL_TABLET | Freq: Once | ORAL | Status: AC
  Administered 2015-10-24: 2 via ORAL
  Filled 2015-10-24: qty 2

## 2015-10-24 MED ORDER — CIPROFLOXACIN HCL 250 MG PO TABS
500.0000 mg | ORAL_TABLET | Freq: Once | ORAL | Status: AC
  Administered 2015-10-24: 500 mg via ORAL
  Filled 2015-10-24: qty 2

## 2015-10-24 MED ORDER — CIPROFLOXACIN HCL 500 MG PO TABS: 500.0000 mg | ORAL_TABLET | Freq: Two times a day (BID) | ORAL | Status: DC

## 2015-10-24 MED ORDER — IPRATROPIUM-ALBUTEROL 0.5-2.5 (3) MG/3ML IN SOLN
3.0000 mL | Freq: Once | RESPIRATORY_TRACT | Status: AC
  Administered 2015-10-24: 3 mL via RESPIRATORY_TRACT
  Filled 2015-10-24: qty 3

## 2015-10-24 NOTE — ED Notes (Addendum)
Sob times last 24 hours.    PCP wanted pt evaluated.  Pt has a history of PE

## 2015-10-24 NOTE — ED Notes (Signed)
EMS notified of transport home request.    Pt awake, alert and oriented. No distress. Denies CP/SOB at this time. Medicated for chronic pain to Rt shoulder and Abx Cipro for UTI/Pneumonia.

## 2015-10-24 NOTE — ED Notes (Signed)
Discharge instructions, prescription, and paperwork given to pt's wife and she has gone home.  Pt waiting for ems transport to get a ride home.

## 2015-10-24 NOTE — ED Provider Notes (Signed)
CSN: [id number]     Arrival date & time 10/24/15  1446 History   First MD Initiated Contact with Patient 10/24/15 1503     Chief Complaint  Patient presents with  . Shortness of Breath     (Consider location/radiation/quality/duration/timing/severity/associated sxs/prior Treatment) HPI Comments: 75 year old male with history of high blood pressure, atrial flutter, vascular disease, renal insufficiency, COPD, high blood pressure, coronary artery disease, pulmonary embolism on Xarelto compliant, mild dementia presents with worsening shortness of breath for the past week. Patient said he said intermittently turns breath and chest tightness. Patient COPD history as well. Patient is not very mobile and his symptoms are more random at times worse with lying flat. Patient denies congestive heart failure history. Patient denies fevers or chills, mild cough. Patient sent over for further evaluation.  Patient is a 75 y.o. male presenting with shortness of breath. The history is provided by the patient and medical records.  Shortness of Breath Associated symptoms: cough and headaches (mild gradual onset frontal)   Associated symptoms: no abdominal pain, no chest pain, no fever, no neck pain, no rash and no vomiting     Past Medical History  Diagnosis Date  . Atrial fibrillation ([place])     Postop hip surgery, January 2012, digoxin and diltiazem, reverted to sinus in the hospital  . Osteoarthritis   . COPD (chronic obstructive pulmonary disease) (HCC)     Emphysema  . Depression   . Posttraumatic stress disorder   . GERD (gastroesophageal reflux disease)   . Irritable bowel   . BPH (benign prostatic hyperplasia)   . Urinary tract bacterial infections   . Osteoporosis   . Hypertension   . Dyslipidemia   . CAD (coronary artery disease)     Stents placed in [place] and [place] in the past  /Myoview 2008, no ischemia, /   . Atrial flutter ([place])     History of the past with syncope and  ?? H. and  consideration, no records  . 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 Hip pain   . Shoulder pain   . Gout   . Peripheral arterial disease ([place])     Dopplers Dr.Vyas office October, 2010, apparent occlusion anterior tibial arteries bilaterally., findings suggest hemodynamically significant stenosis of the proximal right provisional femoral artery and the mid left superficial artery.  Ankle-brachial indices indicate moderate stenosis within both lower extremities  . Chronic kidney disease     Creatinine 2.1, October 13, 2010--> 0.74 10/2012  . Hyperlipidemia     Mixed  . GI bleed   . Ejection fraction     EF 50-55%, echo, May, 2013, severe inferior hypokinesis  . Bilateral pulmonary embolism ([place])     a. 10/2012 - V:Q scan high prob for Bilat PE in setting of dyspnea/hemoptysis-->anticoagulation started.  . Right leg DVT ([place])     a. 10/2012 - subocclusive DVT R popliteal vein.  Marland Kitchen COPD (chronic obstructive pulmonary disease) ([place])   . Chronic pain     bilat hips, right shoulder, RLE  . PE (pulmonary embolism)    Past Surgical History  Procedure Laterality Date  . Knee arthroplasty      Right  . Total knee arthroplasty      Left  . Total hip arthroplasty      Right-complicated hx  . Colon resection    . Back surgery      [place]  . Prostate surgery    . Colonoscopy N/A 03/13/2013  Procedure: COLONOSCOPY;  Surgeon: Rogene Houston, MD;  [facility];  Service: Endoscopy;  Laterality: N/A;  730  . Cataract extraction    . Colonoscopy N/A 12/22/2014    Procedure: COLONOSCOPY;  Surgeon: Rogene Houston, MD;  Location: [facility];  Service: Endoscopy;  Laterality: N/A;  . Givens capsule study N/A 12/24/2014    Procedure: GIVENS CAPSULE STUDY;  Surgeon: Rogene Houston, MD;  Location: [facility];  Service: Endoscopy;  Laterality: N/A;  . Coronary angioplasty with stent placement      x 4   Family History  Problem Relation Age of Onset  .  Diabetes Daughter   . Hypertension Daughter   . Obesity Daughter    Social History  Substance Use Topics  . Smoking status: Former Smoker -- 1.00 packs/day for 30 years    Types: Cigarettes    Quit date: 08/16/2006  . Smokeless tobacco: Never Used     Comment: Patient smoked a pack a day for about 30 years  . Alcohol Use: No    Review of Systems  Constitutional: Positive for fatigue. Negative for fever and chills.  HENT: Negative for congestion.   Eyes: Negative for visual disturbance.  Respiratory: Positive for cough and shortness of breath.   Cardiovascular: Negative for chest pain.  Gastrointestinal: Negative for vomiting and abdominal pain.  Genitourinary: Negative for dysuria and flank pain.  Musculoskeletal: Negative for back pain, neck pain and neck stiffness.  Skin: Negative for rash.  Neurological: Positive for light-headedness and headaches (mild gradual onset frontal).      Allergies  Omeprazole; Other; Penicillins; Sulfonamide derivatives; and Iodine  Home Medications   Prior to Admission medications   Medication Sig Start Date End Date Taking? Authorizing Provider  allopurinol (ZYLOPRIM) 100 MG tablet Take 100 mg by mouth every evening.    Yes Historical Provider, MD  ascorbic acid (VITAMIN C) 500 MG tablet Take 500 mg by mouth daily.    Yes Historical Provider, MD  atorvastatin (LIPITOR) 80 MG tablet Take 80 mg by mouth every evening.    Yes Historical Provider, MD  calcium-vitamin D (OSCAL WITH D) 500-200 MG-UNIT per tablet Take 1 tablet by mouth daily.    Yes Historical Provider, MD  cilostazol (PLETAL) 100 MG tablet Take 1 tablet by mouth daily. 07/07/15  Yes Historical Provider, MD  COLCRYS 0.6 MG tablet Take 1 tablet by mouth daily as needed (gout pain).  07/07/15  Yes Historical Provider, MD  diclofenac sodium (VOLTAREN) 1 % GEL Apply 1 application topically daily. Applied to feet 05/20/15  Yes Historical Provider, MD  diltiazem (CARDIZEM CD) 120 MG 24 hr  capsule Take 1 capsule (120 mg total) by mouth daily. As directed based off blood pressure readings 09/22/15  Yes Satira Sark, MD  fenofibrate (TRICOR) 145 MG tablet Take 1 tablet (145 mg total) by mouth daily. Patient taking differently: Take 145 mg by mouth every evening.  08/16/11  Yes Ezra Sites, MD  fentaNYL (DURAGESIC - DOSED MCG/HR) 75 MCG/HR Place 75 mcg onto the skin every 3 (three) days. Change patch every 3 days 08/27/15  Yes Historical Provider, MD  ferrous sulfate 325 (65 FE) MG tablet Take 325 mg by mouth daily with breakfast.   Yes Historical Provider, MD  finasteride (PROSCAR) 5 MG tablet Take 5 mg by mouth daily.     Yes Historical Provider, MD  furosemide (LASIX) 40 MG tablet Take 40 mg by mouth every evening. *May  take 80mg  as needed for swelling   Yes Historical Provider, MD  HYDROcodone-acetaminophen (NORCO/VICODIN) 5-325 MG per tablet Take 1-2 tablets by mouth every 6 (six) hours as needed for moderate pain or severe pain.  11/18/14  Yes Historical Provider, MD  lidocaine (LIDODERM) 5 % Place 1 patch onto the skin daily as needed (for pain). Every 12 hours as needed 08/29/15  Yes Historical Provider, MD  magnesium oxide (MAG-OX) 400 (241.3 MG) MG tablet Take 1 tablet (400 mg total) by mouth 2 (two) times daily. 01/06/15  Yes Reyne Dumas, MD  metoCLOPramide (REGLAN) 10 MG tablet Take 1 tablet (10 mg total) by mouth daily. 05/01/12  Yes Rogene Houston, MD  metoprolol (LOPRESSOR) 100 MG tablet Take 1 tablet (100 mg total) by mouth 2 (two) times daily. 02/07/12  Yes Donney Dice, PA-C  Multiple Vitamin (MULTIVITAMIN) tablet Take 1 tablet by mouth daily.     Yes Historical Provider, MD  nitroGLYCERIN (NITROSTAT) 0.4 MG SL tablet Place 1 tablet (0.4 mg total) under the tongue every 5 (five) minutes x 3 doses as needed. 01/07/15  Yes Carlena Bjornstad, MD  Omega-3 Fatty Acids (FISH OIL) 1000 MG CAPS Take 2 capsules by mouth daily.    Yes Historical Provider, MD  pantoprazole (PROTONIX)  40 MG tablet Take 40 mg by mouth daily.     Yes Historical Provider, MD  potassium chloride SA (K-DUR,KLOR-CON) 20 MEQ tablet Take 2 tablets (40 mEq total) by mouth daily. Patient taking differently: Take 20 mEq by mouth daily. *Give additional tablet if Lasix is increased due to swelling 01/05/15  Yes Reyne Dumas, MD  PROAIR HFA 108 (90 Base) MCG/ACT inhaler Inhale 2 puffs into the lungs 2 (two) times daily as needed for wheezing or shortness of breath. 2 puffs twice daily 08/20/15  Yes Historical Provider, MD  psyllium (METAMUCIL SMOOTH TEXTURE) 28 % packet Take 1 packet by mouth at bedtime. Patient taking differently: Take 1 packet by mouth at bedtime as needed (bowels).  03/13/13  Yes Rogene Houston, MD  rivaroxaban (XARELTO) 20 MG TABS tablet Take 1 tablet (20 mg total) by mouth daily. 01/27/15  Yes Reyne Dumas, MD  rivastigmine (EXELON) 9.5 mg/24hr Place 1 patch (9.5 mg total) onto the skin daily. 10/06/15  Yes Cameron Sprang, MD  tamsulosin (FLOMAX) 0.4 MG CAPS capsule Take 0.4 mg by mouth daily after supper.   Yes Historical Provider, MD  ciprofloxacin (CIPRO) 500 MG tablet Take 1 tablet (500 mg total) by mouth 2 (two) times daily. One po bid x 7 days 10/24/15   Elnora Morrison, MD   BP 151/92 mmHg  Pulse 94  Temp(Src) 98.6 F (37 C) (Oral)  Resp 19  Ht 6\' 2"  (1.88 m)  Wt 295 lb (133.811 kg)  BMI 37.86 kg/m2  SpO2 99% Physical Exam  Constitutional: He is oriented to person, place, and time. He appears well-developed.  HENT:  Head: Normocephalic and atraumatic.  Eyes: Conjunctivae are normal. Right eye exhibits no discharge. Left eye exhibits no discharge.  Neck: Normal range of motion. Neck supple. No tracheal deviation present.  Cardiovascular: Normal rate and regular rhythm.   Pulmonary/Chest: Effort normal. He has rales (few scattred e x-ray wheeze and rales bilateral ).  Abdominal: Soft. He exhibits no distension. There is no tenderness. There is no guarding.  Musculoskeletal: He  exhibits edema (mild bilateral lower extremitychronic per patient).  Neurological: He is alert and oriented to person, place, and time.  Patient moves all  extremities, decreased on the right due to pain. Gross sensation intact bilateral. Extraocular muscle function intact. Mild slow to respond which is his baseline. Mild dementia.  Skin: Skin is warm. No rash noted.  Psychiatric:  Mild dementia  Nursing note and vitals reviewed.   ED Course  Procedures (including critical care time) Labs Review Labs Reviewed  CBC WITH DIFFERENTIAL/PLATELET - Abnormal; Notable for the following:    RDW 16.8 (*)    All other components within normal limits  COMPREHENSIVE METABOLIC PANEL - Abnormal; Notable for the following:    Glucose, Bld 118 (*)    Total Protein 6.4 (*)    Albumin 3.1 (*)    All other components within normal limits  MAGNESIUM - Abnormal; Notable for the following:    Magnesium 1.4 (*)    All other components within normal limits  D-DIMER, QUANTITATIVE (NOT AT [facility]) - Abnormal; Notable for the following:    D-Dimer, Quant 0.58 (*)    All other components within normal limits  URINE CULTURE  TROPONIN I  BRAIN NATRIURETIC PEPTIDE  URINALYSIS, ROUTINE W REFLEX MICROSCOPIC (NOT AT [facility])    Imaging Review Dg Chest 2 View  10/24/2015  CLINICAL DATA:  Worsening shortness of breath in last 24 hours, history COPD, atrial fibrillation, hypertension, former smoker, coronary artery disease, prior pulmonary embolism EXAM: CHEST  2 VIEW COMPARISON:  07/29/2015 FINDINGS: Enlargement of cardiac silhouette. Atherosclerotic calcification aorta. Mediastinal contours and pulmonary vascularity normal. Probable RIGHT lower lobe consolidation question pneumonia. Remaining lungs clear. Minimal central peribronchial thickening. No pleural effusion or pneumothorax. IMPRESSION: Enlargement of cardiac silhouette. Bronchitic changes with suspected RIGHT lower lobe pneumonia. Electronically Signed   By: Lavonia Dana M.D.   On: 10/24/2015 16:14   I have personally reviewed and evaluated these images and lab results as part of my medical decision-making.   EKG Interpretation   Date/Time:  Friday October 24 2015 14:59:32 EST Ventricular Rate:  93 PR Interval:    QRS Duration: 99 QT Interval:  360 QTC Calculation: 448 R Axis:   13 Text Interpretation:  Atrial fibrillation Low voltage, extremity and  precordial leads Probable anteroseptal infarct, old overall similar  previous Confirmed by Charlton Boule  MD, Marquelle Balow ([id number]) on 10/24/2015 3:08:59 PM      MDM   Final diagnoses:  UTI (lower urinary tract infection)  CAP (community acquired pneumonia)   Patient presents with mild soreness of breath, cough and strong odor to urine. Patient is in no distress, vitals overall unremarkable. Patient at baseline with mild dementia. Family member arrived to takes excellent care of the patient is comfortable with outpatient follow-up with antibiotics to cover both urine/[facility] pneumonia. Patient has very mild elevated d-dimer however is already on anticoagulant, patient has allergy to CT scan dye and it would unlikely change any management. Discussed all this with patient and caregiver.  Results and differential diagnosis were discussed with the patient/parent/guardian. Xrays were independently reviewed by myself.  Close follow up outpatient was discussed, comfortable with the plan.   Medications  ipratropium-albuterol (DUONEB) 0.5-2.5 (3) MG/3ML nebulizer solution 3 mL (3 mLs Nebulization Given 10/24/15 1627)  HYDROcodone-acetaminophen (NORCO/VICODIN) 5-325 MG per tablet 2 tablet (2 tablets Oral Given 10/24/15 1800)  ciprofloxacin (CIPRO) tablet 500 mg (500 mg Oral Given 10/24/15 1800)    Filed Vitals:   10/24/15 1600 10/24/15 1627 10/24/15 1700 10/24/15 1800  BP: 133/99  117/88 151/92  Pulse: 79  85 94  Temp:      TempSrc:  Resp: 15  25 19   Height:      Weight:      SpO2: 95% 100% 98% 99%     Final diagnoses:  UTI (lower urinary tract infection)  CAP (community acquired pneumonia)       Elnora Morrison, MD 10/24/15 [id number]

## 2015-10-24 NOTE — Discharge Instructions (Signed)
If you were given medicines take as directed.  If you are on coumadin or contraceptives realize their levels and effectiveness is altered by many different medicines.  If you have any reaction (rash, tongues swelling, other) to the medicines stop taking and see a physician.    If your blood pressure was elevated in the ER make sure you follow up for management with a primary doctor or return for chest pain, shortness of breath or stroke symptoms.  Please follow up as directed and return to the ER or see a physician for new or worsening symptoms.  Thank you. Filed Vitals:   10/24/15 1600 10/24/15 1627 10/24/15 1700 10/24/15 1800  BP: 133/99  117/88 151/92  Pulse: 79  85 94  Temp:      TempSrc:      Resp: 15  25 19   Height:      Weight:      SpO2: 95% 100% 98% 99%

## 2015-10-26 LAB — URINE CULTURE: SPECIAL REQUESTS: NORMAL

## 2015-10-31 DIAGNOSIS — I82401 Acute embolism and thrombosis of unspecified deep veins of right lower extremity: Secondary | ICD-10-CM | POA: Diagnosis not present

## 2015-10-31 DIAGNOSIS — M1990 Unspecified osteoarthritis, unspecified site: Secondary | ICD-10-CM | POA: Diagnosis not present

## 2015-10-31 DIAGNOSIS — J449 Chronic obstructive pulmonary disease, unspecified: Secondary | ICD-10-CM | POA: Diagnosis not present

## 2015-10-31 DIAGNOSIS — M7989 Other specified soft tissue disorders: Secondary | ICD-10-CM | POA: Diagnosis not present

## 2015-11-04 DIAGNOSIS — R1084 Generalized abdominal pain: Secondary | ICD-10-CM | POA: Diagnosis not present

## 2015-11-04 DIAGNOSIS — N390 Urinary tract infection, site not specified: Secondary | ICD-10-CM | POA: Diagnosis not present

## 2015-11-04 DIAGNOSIS — R3912 Poor urinary stream: Secondary | ICD-10-CM | POA: Diagnosis not present

## 2015-11-04 DIAGNOSIS — R748 Abnormal levels of other serum enzymes: Secondary | ICD-10-CM | POA: Diagnosis not present

## 2015-11-04 DIAGNOSIS — B9689 Other specified bacterial agents as the cause of diseases classified elsewhere: Secondary | ICD-10-CM | POA: Diagnosis not present

## 2015-11-04 DIAGNOSIS — R0602 Shortness of breath: Secondary | ICD-10-CM | POA: Diagnosis not present

## 2015-11-04 DIAGNOSIS — E860 Dehydration: Secondary | ICD-10-CM | POA: Diagnosis not present

## 2015-11-04 DIAGNOSIS — J440 Chronic obstructive pulmonary disease with acute lower respiratory infection: Secondary | ICD-10-CM | POA: Diagnosis not present

## 2015-11-04 DIAGNOSIS — J209 Acute bronchitis, unspecified: Secondary | ICD-10-CM | POA: Diagnosis not present

## 2015-11-05 DIAGNOSIS — J440 Chronic obstructive pulmonary disease with acute lower respiratory infection: Secondary | ICD-10-CM | POA: Diagnosis not present

## 2015-11-05 DIAGNOSIS — N390 Urinary tract infection, site not specified: Secondary | ICD-10-CM | POA: Diagnosis not present

## 2015-11-07 DIAGNOSIS — Z6841 Body Mass Index (BMI) 40.0 and over, adult: Secondary | ICD-10-CM | POA: Diagnosis not present

## 2015-11-07 DIAGNOSIS — N390 Urinary tract infection, site not specified: Secondary | ICD-10-CM | POA: Diagnosis not present

## 2015-11-07 DIAGNOSIS — Z86711 Personal history of pulmonary embolism: Secondary | ICD-10-CM | POA: Diagnosis not present

## 2015-11-07 DIAGNOSIS — L97519 Non-pressure chronic ulcer of other part of right foot with unspecified severity: Secondary | ICD-10-CM | POA: Diagnosis not present

## 2015-11-07 DIAGNOSIS — L89312 Pressure ulcer of right buttock, stage 2: Secondary | ICD-10-CM | POA: Diagnosis not present

## 2015-11-07 DIAGNOSIS — J441 Chronic obstructive pulmonary disease with (acute) exacerbation: Secondary | ICD-10-CM | POA: Diagnosis not present

## 2015-11-07 DIAGNOSIS — L03116 Cellulitis of left lower limb: Secondary | ICD-10-CM | POA: Diagnosis not present

## 2015-11-07 DIAGNOSIS — L89322 Pressure ulcer of left buttock, stage 2: Secondary | ICD-10-CM | POA: Diagnosis not present

## 2015-11-07 DIAGNOSIS — I482 Chronic atrial fibrillation: Secondary | ICD-10-CM | POA: Diagnosis not present

## 2015-11-07 DIAGNOSIS — E669 Obesity, unspecified: Secondary | ICD-10-CM | POA: Diagnosis not present

## 2015-11-07 DIAGNOSIS — Z7901 Long term (current) use of anticoagulants: Secondary | ICD-10-CM | POA: Diagnosis not present

## 2015-11-07 DIAGNOSIS — I509 Heart failure, unspecified: Secondary | ICD-10-CM | POA: Diagnosis not present

## 2015-11-07 DIAGNOSIS — J449 Chronic obstructive pulmonary disease, unspecified: Secondary | ICD-10-CM | POA: Diagnosis not present

## 2015-11-07 DIAGNOSIS — I10 Essential (primary) hypertension: Secondary | ICD-10-CM | POA: Diagnosis not present

## 2015-11-07 DIAGNOSIS — D649 Anemia, unspecified: Secondary | ICD-10-CM | POA: Diagnosis not present

## 2015-11-10 DIAGNOSIS — L03116 Cellulitis of left lower limb: Secondary | ICD-10-CM | POA: Diagnosis not present

## 2015-11-10 DIAGNOSIS — L89312 Pressure ulcer of right buttock, stage 2: Secondary | ICD-10-CM | POA: Diagnosis not present

## 2015-11-10 DIAGNOSIS — J441 Chronic obstructive pulmonary disease with (acute) exacerbation: Secondary | ICD-10-CM | POA: Diagnosis not present

## 2015-11-10 DIAGNOSIS — J449 Chronic obstructive pulmonary disease, unspecified: Secondary | ICD-10-CM | POA: Diagnosis not present

## 2015-11-10 DIAGNOSIS — L97519 Non-pressure chronic ulcer of other part of right foot with unspecified severity: Secondary | ICD-10-CM | POA: Diagnosis not present

## 2015-11-10 DIAGNOSIS — L89322 Pressure ulcer of left buttock, stage 2: Secondary | ICD-10-CM | POA: Diagnosis not present

## 2015-11-11 DIAGNOSIS — J441 Chronic obstructive pulmonary disease with (acute) exacerbation: Secondary | ICD-10-CM | POA: Diagnosis not present

## 2015-11-11 DIAGNOSIS — L03116 Cellulitis of left lower limb: Secondary | ICD-10-CM | POA: Diagnosis not present

## 2015-11-11 DIAGNOSIS — L97519 Non-pressure chronic ulcer of other part of right foot with unspecified severity: Secondary | ICD-10-CM | POA: Diagnosis not present

## 2015-11-11 DIAGNOSIS — J449 Chronic obstructive pulmonary disease, unspecified: Secondary | ICD-10-CM | POA: Diagnosis not present

## 2015-11-11 DIAGNOSIS — L89322 Pressure ulcer of left buttock, stage 2: Secondary | ICD-10-CM | POA: Diagnosis not present

## 2015-11-11 DIAGNOSIS — L89312 Pressure ulcer of right buttock, stage 2: Secondary | ICD-10-CM | POA: Diagnosis not present

## 2015-11-12 DIAGNOSIS — J441 Chronic obstructive pulmonary disease with (acute) exacerbation: Secondary | ICD-10-CM | POA: Diagnosis not present

## 2015-11-12 DIAGNOSIS — J449 Chronic obstructive pulmonary disease, unspecified: Secondary | ICD-10-CM | POA: Diagnosis not present

## 2015-11-12 DIAGNOSIS — L97519 Non-pressure chronic ulcer of other part of right foot with unspecified severity: Secondary | ICD-10-CM | POA: Diagnosis not present

## 2015-11-12 DIAGNOSIS — L03116 Cellulitis of left lower limb: Secondary | ICD-10-CM | POA: Diagnosis not present

## 2015-11-12 DIAGNOSIS — L89312 Pressure ulcer of right buttock, stage 2: Secondary | ICD-10-CM | POA: Diagnosis not present

## 2015-11-12 DIAGNOSIS — L89322 Pressure ulcer of left buttock, stage 2: Secondary | ICD-10-CM | POA: Diagnosis not present

## 2015-11-14 DIAGNOSIS — L89322 Pressure ulcer of left buttock, stage 2: Secondary | ICD-10-CM | POA: Diagnosis not present

## 2015-11-14 DIAGNOSIS — J449 Chronic obstructive pulmonary disease, unspecified: Secondary | ICD-10-CM | POA: Diagnosis not present

## 2015-11-14 DIAGNOSIS — L89312 Pressure ulcer of right buttock, stage 2: Secondary | ICD-10-CM | POA: Diagnosis not present

## 2015-11-14 DIAGNOSIS — J441 Chronic obstructive pulmonary disease with (acute) exacerbation: Secondary | ICD-10-CM | POA: Diagnosis not present

## 2015-11-14 DIAGNOSIS — L97519 Non-pressure chronic ulcer of other part of right foot with unspecified severity: Secondary | ICD-10-CM | POA: Diagnosis not present

## 2015-11-14 DIAGNOSIS — L03116 Cellulitis of left lower limb: Secondary | ICD-10-CM | POA: Diagnosis not present

## 2015-11-18 DIAGNOSIS — J441 Chronic obstructive pulmonary disease with (acute) exacerbation: Secondary | ICD-10-CM | POA: Diagnosis not present

## 2015-11-18 DIAGNOSIS — L97519 Non-pressure chronic ulcer of other part of right foot with unspecified severity: Secondary | ICD-10-CM | POA: Diagnosis not present

## 2015-11-18 DIAGNOSIS — L89322 Pressure ulcer of left buttock, stage 2: Secondary | ICD-10-CM | POA: Diagnosis not present

## 2015-11-18 DIAGNOSIS — L03116 Cellulitis of left lower limb: Secondary | ICD-10-CM | POA: Diagnosis not present

## 2015-11-18 DIAGNOSIS — J449 Chronic obstructive pulmonary disease, unspecified: Secondary | ICD-10-CM | POA: Diagnosis not present

## 2015-11-18 DIAGNOSIS — L89312 Pressure ulcer of right buttock, stage 2: Secondary | ICD-10-CM | POA: Diagnosis not present

## 2015-11-20 DIAGNOSIS — J441 Chronic obstructive pulmonary disease with (acute) exacerbation: Secondary | ICD-10-CM | POA: Diagnosis not present

## 2015-11-20 DIAGNOSIS — L89312 Pressure ulcer of right buttock, stage 2: Secondary | ICD-10-CM | POA: Diagnosis not present

## 2015-11-20 DIAGNOSIS — L89322 Pressure ulcer of left buttock, stage 2: Secondary | ICD-10-CM | POA: Diagnosis not present

## 2015-11-20 DIAGNOSIS — L03116 Cellulitis of left lower limb: Secondary | ICD-10-CM | POA: Diagnosis not present

## 2015-11-20 DIAGNOSIS — L97519 Non-pressure chronic ulcer of other part of right foot with unspecified severity: Secondary | ICD-10-CM | POA: Diagnosis not present

## 2015-11-20 DIAGNOSIS — J449 Chronic obstructive pulmonary disease, unspecified: Secondary | ICD-10-CM | POA: Diagnosis not present

## 2015-11-21 DIAGNOSIS — J449 Chronic obstructive pulmonary disease, unspecified: Secondary | ICD-10-CM | POA: Diagnosis not present

## 2015-11-21 DIAGNOSIS — L97519 Non-pressure chronic ulcer of other part of right foot with unspecified severity: Secondary | ICD-10-CM | POA: Diagnosis not present

## 2015-11-21 DIAGNOSIS — L03116 Cellulitis of left lower limb: Secondary | ICD-10-CM | POA: Diagnosis not present

## 2015-11-21 DIAGNOSIS — L89312 Pressure ulcer of right buttock, stage 2: Secondary | ICD-10-CM | POA: Diagnosis not present

## 2015-11-21 DIAGNOSIS — L89322 Pressure ulcer of left buttock, stage 2: Secondary | ICD-10-CM | POA: Diagnosis not present

## 2015-11-21 DIAGNOSIS — J441 Chronic obstructive pulmonary disease with (acute) exacerbation: Secondary | ICD-10-CM | POA: Diagnosis not present

## 2015-11-25 DIAGNOSIS — M19011 Primary osteoarthritis, right shoulder: Secondary | ICD-10-CM | POA: Diagnosis not present

## 2015-11-25 DIAGNOSIS — M19012 Primary osteoarthritis, left shoulder: Secondary | ICD-10-CM | POA: Diagnosis not present

## 2015-11-26 DIAGNOSIS — J449 Chronic obstructive pulmonary disease, unspecified: Secondary | ICD-10-CM | POA: Diagnosis not present

## 2015-11-26 DIAGNOSIS — J441 Chronic obstructive pulmonary disease with (acute) exacerbation: Secondary | ICD-10-CM | POA: Diagnosis not present

## 2015-11-26 DIAGNOSIS — L89312 Pressure ulcer of right buttock, stage 2: Secondary | ICD-10-CM | POA: Diagnosis not present

## 2015-11-26 DIAGNOSIS — L03116 Cellulitis of left lower limb: Secondary | ICD-10-CM | POA: Diagnosis not present

## 2015-11-26 DIAGNOSIS — L97519 Non-pressure chronic ulcer of other part of right foot with unspecified severity: Secondary | ICD-10-CM | POA: Diagnosis not present

## 2015-11-26 DIAGNOSIS — L89322 Pressure ulcer of left buttock, stage 2: Secondary | ICD-10-CM | POA: Diagnosis not present

## 2015-11-27 DIAGNOSIS — J449 Chronic obstructive pulmonary disease, unspecified: Secondary | ICD-10-CM | POA: Diagnosis not present

## 2015-11-27 DIAGNOSIS — I82401 Acute embolism and thrombosis of unspecified deep veins of right lower extremity: Secondary | ICD-10-CM | POA: Diagnosis not present

## 2015-11-27 DIAGNOSIS — Z299 Encounter for prophylactic measures, unspecified: Secondary | ICD-10-CM | POA: Diagnosis not present

## 2015-11-27 DIAGNOSIS — I10 Essential (primary) hypertension: Secondary | ICD-10-CM | POA: Diagnosis not present

## 2015-11-27 DIAGNOSIS — M1990 Unspecified osteoarthritis, unspecified site: Secondary | ICD-10-CM | POA: Diagnosis not present

## 2015-12-02 DIAGNOSIS — L97519 Non-pressure chronic ulcer of other part of right foot with unspecified severity: Secondary | ICD-10-CM | POA: Diagnosis not present

## 2015-12-02 DIAGNOSIS — L89312 Pressure ulcer of right buttock, stage 2: Secondary | ICD-10-CM | POA: Diagnosis not present

## 2015-12-02 DIAGNOSIS — L89322 Pressure ulcer of left buttock, stage 2: Secondary | ICD-10-CM | POA: Diagnosis not present

## 2015-12-02 DIAGNOSIS — J441 Chronic obstructive pulmonary disease with (acute) exacerbation: Secondary | ICD-10-CM | POA: Diagnosis not present

## 2015-12-02 DIAGNOSIS — J449 Chronic obstructive pulmonary disease, unspecified: Secondary | ICD-10-CM | POA: Diagnosis not present

## 2015-12-02 DIAGNOSIS — L03116 Cellulitis of left lower limb: Secondary | ICD-10-CM | POA: Diagnosis not present

## 2015-12-05 DIAGNOSIS — J441 Chronic obstructive pulmonary disease with (acute) exacerbation: Secondary | ICD-10-CM | POA: Diagnosis not present

## 2015-12-05 DIAGNOSIS — L03116 Cellulitis of left lower limb: Secondary | ICD-10-CM | POA: Diagnosis not present

## 2015-12-05 DIAGNOSIS — L89322 Pressure ulcer of left buttock, stage 2: Secondary | ICD-10-CM | POA: Diagnosis not present

## 2015-12-05 DIAGNOSIS — J449 Chronic obstructive pulmonary disease, unspecified: Secondary | ICD-10-CM | POA: Diagnosis not present

## 2015-12-05 DIAGNOSIS — L97519 Non-pressure chronic ulcer of other part of right foot with unspecified severity: Secondary | ICD-10-CM | POA: Diagnosis not present

## 2015-12-05 DIAGNOSIS — L89312 Pressure ulcer of right buttock, stage 2: Secondary | ICD-10-CM | POA: Diagnosis not present

## 2015-12-09 DIAGNOSIS — N390 Urinary tract infection, site not specified: Secondary | ICD-10-CM | POA: Diagnosis not present

## 2015-12-11 DIAGNOSIS — L89322 Pressure ulcer of left buttock, stage 2: Secondary | ICD-10-CM | POA: Diagnosis not present

## 2015-12-11 DIAGNOSIS — J441 Chronic obstructive pulmonary disease with (acute) exacerbation: Secondary | ICD-10-CM | POA: Diagnosis not present

## 2015-12-11 DIAGNOSIS — L03116 Cellulitis of left lower limb: Secondary | ICD-10-CM | POA: Diagnosis not present

## 2015-12-11 DIAGNOSIS — J449 Chronic obstructive pulmonary disease, unspecified: Secondary | ICD-10-CM | POA: Diagnosis not present

## 2015-12-11 DIAGNOSIS — L89312 Pressure ulcer of right buttock, stage 2: Secondary | ICD-10-CM | POA: Diagnosis not present

## 2015-12-11 DIAGNOSIS — L97519 Non-pressure chronic ulcer of other part of right foot with unspecified severity: Secondary | ICD-10-CM | POA: Diagnosis not present

## 2015-12-16 DIAGNOSIS — J441 Chronic obstructive pulmonary disease with (acute) exacerbation: Secondary | ICD-10-CM | POA: Diagnosis not present

## 2015-12-16 DIAGNOSIS — L97519 Non-pressure chronic ulcer of other part of right foot with unspecified severity: Secondary | ICD-10-CM | POA: Diagnosis not present

## 2015-12-16 DIAGNOSIS — L89322 Pressure ulcer of left buttock, stage 2: Secondary | ICD-10-CM | POA: Diagnosis not present

## 2015-12-16 DIAGNOSIS — J449 Chronic obstructive pulmonary disease, unspecified: Secondary | ICD-10-CM | POA: Diagnosis not present

## 2015-12-16 DIAGNOSIS — L89312 Pressure ulcer of right buttock, stage 2: Secondary | ICD-10-CM | POA: Diagnosis not present

## 2015-12-16 DIAGNOSIS — L03116 Cellulitis of left lower limb: Secondary | ICD-10-CM | POA: Diagnosis not present

## 2015-12-17 DIAGNOSIS — L89312 Pressure ulcer of right buttock, stage 2: Secondary | ICD-10-CM | POA: Diagnosis not present

## 2015-12-17 DIAGNOSIS — L03116 Cellulitis of left lower limb: Secondary | ICD-10-CM | POA: Diagnosis not present

## 2015-12-17 DIAGNOSIS — J441 Chronic obstructive pulmonary disease with (acute) exacerbation: Secondary | ICD-10-CM | POA: Diagnosis not present

## 2015-12-17 DIAGNOSIS — L97519 Non-pressure chronic ulcer of other part of right foot with unspecified severity: Secondary | ICD-10-CM | POA: Diagnosis not present

## 2015-12-17 DIAGNOSIS — L89322 Pressure ulcer of left buttock, stage 2: Secondary | ICD-10-CM | POA: Diagnosis not present

## 2015-12-17 DIAGNOSIS — J449 Chronic obstructive pulmonary disease, unspecified: Secondary | ICD-10-CM | POA: Diagnosis not present

## 2015-12-19 DIAGNOSIS — J441 Chronic obstructive pulmonary disease with (acute) exacerbation: Secondary | ICD-10-CM | POA: Diagnosis not present

## 2015-12-19 DIAGNOSIS — J449 Chronic obstructive pulmonary disease, unspecified: Secondary | ICD-10-CM | POA: Diagnosis not present

## 2015-12-19 DIAGNOSIS — L89322 Pressure ulcer of left buttock, stage 2: Secondary | ICD-10-CM | POA: Diagnosis not present

## 2015-12-19 DIAGNOSIS — L03116 Cellulitis of left lower limb: Secondary | ICD-10-CM | POA: Diagnosis not present

## 2015-12-19 DIAGNOSIS — L97519 Non-pressure chronic ulcer of other part of right foot with unspecified severity: Secondary | ICD-10-CM | POA: Diagnosis not present

## 2015-12-19 DIAGNOSIS — L89312 Pressure ulcer of right buttock, stage 2: Secondary | ICD-10-CM | POA: Diagnosis not present

## 2015-12-23 DIAGNOSIS — L03116 Cellulitis of left lower limb: Secondary | ICD-10-CM | POA: Diagnosis not present

## 2015-12-23 DIAGNOSIS — J441 Chronic obstructive pulmonary disease with (acute) exacerbation: Secondary | ICD-10-CM | POA: Diagnosis not present

## 2015-12-23 DIAGNOSIS — L89322 Pressure ulcer of left buttock, stage 2: Secondary | ICD-10-CM | POA: Diagnosis not present

## 2015-12-23 DIAGNOSIS — L97519 Non-pressure chronic ulcer of other part of right foot with unspecified severity: Secondary | ICD-10-CM | POA: Diagnosis not present

## 2015-12-23 DIAGNOSIS — J449 Chronic obstructive pulmonary disease, unspecified: Secondary | ICD-10-CM | POA: Diagnosis not present

## 2015-12-23 DIAGNOSIS — L89312 Pressure ulcer of right buttock, stage 2: Secondary | ICD-10-CM | POA: Diagnosis not present

## 2015-12-26 DIAGNOSIS — L03116 Cellulitis of left lower limb: Secondary | ICD-10-CM | POA: Diagnosis not present

## 2015-12-26 DIAGNOSIS — L89322 Pressure ulcer of left buttock, stage 2: Secondary | ICD-10-CM | POA: Diagnosis not present

## 2015-12-26 DIAGNOSIS — J449 Chronic obstructive pulmonary disease, unspecified: Secondary | ICD-10-CM | POA: Diagnosis not present

## 2015-12-26 DIAGNOSIS — L97519 Non-pressure chronic ulcer of other part of right foot with unspecified severity: Secondary | ICD-10-CM | POA: Diagnosis not present

## 2015-12-26 DIAGNOSIS — J441 Chronic obstructive pulmonary disease with (acute) exacerbation: Secondary | ICD-10-CM | POA: Diagnosis not present

## 2015-12-26 DIAGNOSIS — L89312 Pressure ulcer of right buttock, stage 2: Secondary | ICD-10-CM | POA: Diagnosis not present

## 2015-12-30 DIAGNOSIS — J441 Chronic obstructive pulmonary disease with (acute) exacerbation: Secondary | ICD-10-CM | POA: Diagnosis not present

## 2015-12-30 DIAGNOSIS — N390 Urinary tract infection, site not specified: Secondary | ICD-10-CM | POA: Diagnosis not present

## 2015-12-30 DIAGNOSIS — L97519 Non-pressure chronic ulcer of other part of right foot with unspecified severity: Secondary | ICD-10-CM | POA: Diagnosis not present

## 2015-12-30 DIAGNOSIS — L89322 Pressure ulcer of left buttock, stage 2: Secondary | ICD-10-CM | POA: Diagnosis not present

## 2015-12-30 DIAGNOSIS — L03116 Cellulitis of left lower limb: Secondary | ICD-10-CM | POA: Diagnosis not present

## 2015-12-30 DIAGNOSIS — J449 Chronic obstructive pulmonary disease, unspecified: Secondary | ICD-10-CM | POA: Diagnosis not present

## 2015-12-30 DIAGNOSIS — L89312 Pressure ulcer of right buttock, stage 2: Secondary | ICD-10-CM | POA: Diagnosis not present

## 2016-01-01 DIAGNOSIS — L89322 Pressure ulcer of left buttock, stage 2: Secondary | ICD-10-CM | POA: Diagnosis not present

## 2016-01-01 DIAGNOSIS — L89312 Pressure ulcer of right buttock, stage 2: Secondary | ICD-10-CM | POA: Diagnosis not present

## 2016-01-01 DIAGNOSIS — L03116 Cellulitis of left lower limb: Secondary | ICD-10-CM | POA: Diagnosis not present

## 2016-01-01 DIAGNOSIS — J449 Chronic obstructive pulmonary disease, unspecified: Secondary | ICD-10-CM | POA: Diagnosis not present

## 2016-01-01 DIAGNOSIS — J441 Chronic obstructive pulmonary disease with (acute) exacerbation: Secondary | ICD-10-CM | POA: Diagnosis not present

## 2016-01-01 DIAGNOSIS — L97519 Non-pressure chronic ulcer of other part of right foot with unspecified severity: Secondary | ICD-10-CM | POA: Diagnosis not present

## 2016-01-06 ENCOUNTER — Encounter (INDEPENDENT_AMBULATORY_CARE_PROVIDER_SITE_OTHER): Payer: Self-pay | Admitting: Internal Medicine

## 2016-01-06 ENCOUNTER — Ambulatory Visit (INDEPENDENT_AMBULATORY_CARE_PROVIDER_SITE_OTHER): Payer: Medicare Other | Admitting: Internal Medicine

## 2016-01-06 VITALS — BP 140/70 | HR 92 | Temp 98.2°F | Ht 74.0 in | Wt 300.0 lb

## 2016-01-06 DIAGNOSIS — R197 Diarrhea, unspecified: Secondary | ICD-10-CM | POA: Diagnosis not present

## 2016-01-06 MED ORDER — METRONIDAZOLE 500 MG PO TABS: 500.0000 mg | ORAL_TABLET | Freq: Three times a day (TID) | ORAL | Status: DC

## 2016-01-06 NOTE — Patient Instructions (Signed)
Imodium BID. GI pathogen Flagyl 500mg  TID x 10 days.

## 2016-01-06 NOTE — Progress Notes (Signed)
Subjective:    Patient ID: Bryce Berry, male    DOB: 09/05/1940, 75 y.o.   MRN: [id number] Patient is w/c bound. Weight is stated per wife. Patient is unable to sign.  HPI Here today with c/o diarrhea. He was last seen in May of 2016. H of GI bleed in 2016. [facility]mitted to [facility] with hemoglobin of 8.0.  He received 2 units of PRBCs.  He has had diarrhea for over a week.  His wife states he may be having 6 stools a day. There has been no blood. Hx of C-difficile.  He has tried Lomotil and Pepto Bismol. His wife reports his stools have been normal color.  Before the diarrhea, he would have one stool a day. There has been no fever. Appetite is good. No weight.   12/22/2014 Bleeding Scan: IMPRESSION: Negative radio labeled red blood cell study for GI bleeding.  12/22/2014 Given's capsule: Summary & Recommendations: Multiple small bowel ulcers in distal small bowel along with fresh blood. Bleeding site not identified but felt to be from these ulcers. These ulcers appear to be NSAID induced, Capsule did not reach cecum in study period of 9 hours and 22 minutes. Repeat H/H later today. If stable should be able to go home today or tomorrow. No NSAIDs. Can resume Xarelto in one week.            Review of Systems Past Medical History  Diagnosis Date  . Atrial fibrillation ([place])     Postop hip surgery, January 2012, digoxin and diltiazem, reverted to sinus in the hospital  . Osteoarthritis   . COPD (chronic obstructive pulmonary disease) (HCC)     Emphysema  . Depression   . Posttraumatic stress disorder   . GERD (gastroesophageal reflux disease)   . Irritable bowel   . BPH (benign prostatic hyperplasia)   . Urinary tract bacterial infections   . Osteoporosis   . Hypertension   . Dyslipidemia   . CAD (coronary artery disease)     Stents placed in [place] and [place] in the past  /Myoview 2008, no ischemia, /   . Atrial flutter ([place])     History of the past with syncope and  ?? H.  and consideration, no records  . 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 Hip pain   . Shoulder pain   . Gout   . Peripheral arterial disease ([place])     Dopplers Dr.Vyas office October, 2010, apparent occlusion anterior tibial arteries bilaterally., findings suggest hemodynamically significant stenosis of the proximal right provisional femoral artery and the mid left superficial artery.  Ankle-brachial indices indicate moderate stenosis within both lower extremities  . Chronic kidney disease     Creatinine 2.1, October 13, 2010--> 0.74 10/2012  . Hyperlipidemia     [place]  . GI bleed   . Ejection fraction     EF 50-55%, echo, May, 2013, severe inferior hypokinesis  . Bilateral pulmonary embolism ([place])     a. 10/2012 - V:Q scan high prob for Bilat PE in setting of dyspnea/hemoptysis-->anticoagulation started.  . Right leg DVT ([place])     a. 10/2012 - subocclusive DVT R popliteal vein.  Marland Kitchen COPD (chronic obstructive pulmonary disease) ([place])   . Chronic pain     bilat hips, right shoulder, RLE  . PE (pulmonary embolism)     Past Surgical History  Procedure Laterality Date  . Knee arthroplasty      Right  . Total knee arthroplasty  Left  . Total hip arthroplasty      Right-complicated hx  . Colon resection    . Back surgery      [place]  . Prostate surgery    . Colonoscopy N/A 03/13/2013    Procedure: COLONOSCOPY;  Surgeon: Rogene Houston, MD;  [facility];  Service: Endoscopy;  Laterality: N/A;  730  . Cataract extraction    . Colonoscopy N/A 12/22/2014    Procedure: COLONOSCOPY;  Surgeon: Rogene Houston, MD;  [facility];  Service: Endoscopy;  Laterality: N/A;  . Givens capsule study N/A 12/24/2014    Procedure: GIVENS CAPSULE STUDY;  Surgeon: Rogene Houston, MD;  [facility];  Service: Endoscopy;  Laterality: N/A;  . Coronary angioplasty with stent placement      x 4    Allergies  Allergen Reactions  . Omeprazole     . Other     Paper tape  . Penicillins Other (See Comments)    Has patient had a PCN reaction causing immediate rash, facial/tongue/throat swelling, SOB or lightheadedness with hypotension: No Has patient had a PCN reaction causing severe rash involving mucus membranes or skin necrosis: No Has patient had a PCN reaction that required hospitalization No Has patient had a PCN reaction occurring within the last 10 years: No If all of the above answers are "NO", then may proceed with Cephalosporin use.    Rapid heart rate; flushed  . Sulfonamide Derivatives Other (See Comments)    Rapid heart rate; flushed  . Iodine Rash    Current Outpatient Prescriptions on File Prior to Visit  Medication Sig Dispense Refill  . allopurinol (ZYLOPRIM) 100 MG tablet Take 100 mg by mouth every evening.     Marland Kitchen ascorbic acid (VITAMIN C) 500 MG tablet Take 500 mg by mouth daily.     Marland Kitchen atorvastatin (LIPITOR) 80 MG tablet Take 80 mg by mouth every evening.     . calcium-vitamin D (OSCAL WITH D) 500-200 MG-UNIT per tablet Take 1 tablet by mouth daily.     . cilostazol (PLETAL) 100 MG tablet Take 1 tablet by mouth daily.    Marland Kitchen COLCRYS 0.6 MG tablet Take 1 tablet by mouth daily as needed (gout pain).     Marland Kitchen diclofenac sodium (VOLTAREN) 1 % GEL Apply 1 application topically daily. Applied to feet    . diltiazem (CARDIZEM CD) 120 MG 24 hr capsule Take 1 capsule (120 mg total) by mouth daily. As directed based off blood pressure readings 60 capsule 0  . fenofibrate (TRICOR) 145 MG tablet Take 1 tablet (145 mg total) by mouth daily. (Patient taking differently: Take 145 mg by mouth every evening. ) 30 tablet 6  . fentaNYL (DURAGESIC - DOSED MCG/HR) 75 MCG/HR Place 75 mcg onto the skin every 3 (three) days. Change patch every 3 days    . ferrous sulfate 325 (65 FE) MG tablet Take 325 mg by mouth daily with breakfast.    . finasteride (PROSCAR) 5 MG tablet Take 5 mg by mouth daily.      . furosemide (LASIX) 40 MG tablet  Take 40 mg by mouth every evening. *May take 80mg  as needed for swelling    . HYDROcodone-acetaminophen (NORCO/VICODIN) 5-325 MG per tablet Take 1-2 tablets by mouth every 6 (six) hours as needed for moderate pain or severe pain.   0  . lidocaine (LIDODERM) 5 % Place 1 patch onto the skin daily as needed (for pain). Every  12 hours as needed    . magnesium oxide (MAG-OX) 400 (241.3 MG) MG tablet Take 1 tablet (400 mg total) by mouth 2 (two) times daily. 60 tablet 2  . metoCLOPramide (REGLAN) 10 MG tablet Take 1 tablet (10 mg total) by mouth daily. 30 tablet 5  . metoprolol (LOPRESSOR) 100 MG tablet Take 1 tablet (100 mg total) by mouth 2 (two) times daily. 180 tablet 3  . Multiple Vitamin (MULTIVITAMIN) tablet Take 1 tablet by mouth daily.      . nitroGLYCERIN (NITROSTAT) 0.4 MG SL tablet Place 1 tablet (0.4 mg total) under the tongue every 5 (five) minutes x 3 doses as needed. 25 tablet 3  . Omega-3 Fatty Acids (FISH OIL) 1000 MG CAPS Take 2 capsules by mouth daily.     . pantoprazole (PROTONIX) 40 MG tablet Take 40 mg by mouth daily.      . potassium chloride SA (K-DUR,KLOR-CON) 20 MEQ tablet Take 2 tablets (40 mEq total) by mouth daily. (Patient taking differently: Take 20 mEq by mouth daily. *Give additional tablet if Lasix is increased due to swelling) 30 tablet 0  . PROAIR HFA 108 (90 Base) MCG/ACT inhaler Inhale 2 puffs into the lungs 2 (two) times daily as needed for wheezing or shortness of breath. 2 puffs twice daily    . psyllium (METAMUCIL SMOOTH TEXTURE) 28 % packet Take 1 packet by mouth at bedtime. (Patient taking differently: Take 1 packet by mouth at bedtime as needed (bowels). )    . rivaroxaban (XARELTO) 20 MG TABS tablet Take 1 tablet (20 mg total) by mouth daily. 30 tablet 10  . rivastigmine (EXELON) 9.5 mg/24hr Place 1 patch (9.5 mg total) onto the skin daily. 30 patch 3  . tamsulosin (FLOMAX) 0.4 MG CAPS capsule Take 0.4 mg by mouth daily after supper.    . [DISCONTINUED]  solifenacin (VESICARE) 10 MG tablet Take 10 mg by mouth daily.      No current facility-administered medications on file prior to visit.        Objective:   Physical Exam  Filed Vitals:   01/06/16 1352  Height: 6\' 2"  (1.88 m)  Weight: 300 lb (136.079 kg)  Alert and oriented. Skin warm and dry. Oral mucosa is moist.   . Sclera anicteric, conjunctivae is pink. Thyroid not enlarged. No cervical lymphadenopathy. Lungs clear. Heart regular rate and rhythm.  Abdomen is soft. Bowel sounds are positive. No hepatomegaly. No abdominal masses felt. No tenderness.  No edema to lower extremities.           Assessment & Plan:  Diarrhea. ? Etiology. Hx of C-diff. Needs GI pathogen. Rx for Flagyl 500mg  TID x 10 days to his pharmacy.

## 2016-01-07 ENCOUNTER — Ambulatory Visit (INDEPENDENT_AMBULATORY_CARE_PROVIDER_SITE_OTHER): Payer: Medicare Other | Admitting: Internal Medicine

## 2016-01-08 LAB — GASTROINTESTINAL PATHOGEN PANEL PCR
C. difficile Tox A/B, PCR: NOT DETECTED
Campylobacter, PCR: NOT DETECTED
Cryptosporidium, PCR: NOT DETECTED
E coli (ETEC) LT/ST PCR: NOT DETECTED
E coli (STEC) stx1/stx2, PCR: NOT DETECTED
E coli 0157, PCR: NOT DETECTED
Giardia lamblia, PCR: NOT DETECTED
Norovirus, PCR: NOT DETECTED
Rotavirus A, PCR: NOT DETECTED
Salmonella, PCR: NOT DETECTED
Shigella, PCR: NOT DETECTED

## 2016-01-13 ENCOUNTER — Telehealth (INDEPENDENT_AMBULATORY_CARE_PROVIDER_SITE_OTHER): Payer: Self-pay | Admitting: Internal Medicine

## 2016-01-13 NOTE — Telephone Encounter (Signed)
Patient called, left a message that he wanted to speak to Karna Christmas about a matter.  No details left.  [phone]

## 2016-01-15 ENCOUNTER — Encounter: Payer: Self-pay | Admitting: Family Medicine

## 2016-01-16 ENCOUNTER — Encounter (HOSPITAL_COMMUNITY): Payer: Self-pay

## 2016-01-16 ENCOUNTER — Emergency Department (HOSPITAL_COMMUNITY)
Admission: EM | Admit: 2016-01-16 | Discharge: 2016-01-16 | Disposition: A | Discharge status: Home / Self Care | Payer: Medicare Other | Attending: Emergency Medicine | Admitting: Emergency Medicine

## 2016-01-16 ENCOUNTER — Emergency Department (HOSPITAL_COMMUNITY): Payer: Medicare Other

## 2016-01-16 DIAGNOSIS — S92301A Fracture of unspecified metatarsal bone(s), right foot, initial encounter for closed fracture: Secondary | ICD-10-CM

## 2016-01-16 DIAGNOSIS — S82831A Other fracture of upper and lower end of right fibula, initial encounter for closed fracture: Secondary | ICD-10-CM | POA: Insufficient documentation

## 2016-01-16 DIAGNOSIS — Z87891 Personal history of nicotine dependence: Secondary | ICD-10-CM | POA: Insufficient documentation

## 2016-01-16 DIAGNOSIS — N189 Chronic kidney disease, unspecified: Secondary | ICD-10-CM | POA: Diagnosis not present

## 2016-01-16 DIAGNOSIS — I2510 Atherosclerotic heart disease of native coronary artery without angina pectoris: Secondary | ICD-10-CM | POA: Insufficient documentation

## 2016-01-16 DIAGNOSIS — I739 Peripheral vascular disease, unspecified: Secondary | ICD-10-CM | POA: Diagnosis not present

## 2016-01-16 DIAGNOSIS — Y929 Unspecified place or not applicable: Secondary | ICD-10-CM | POA: Insufficient documentation

## 2016-01-16 DIAGNOSIS — F329 Major depressive disorder, single episode, unspecified: Secondary | ICD-10-CM | POA: Insufficient documentation

## 2016-01-16 DIAGNOSIS — S92351A Displaced fracture of fifth metatarsal bone, right foot, initial encounter for closed fracture: Secondary | ICD-10-CM | POA: Insufficient documentation

## 2016-01-16 DIAGNOSIS — Z79899 Other long term (current) drug therapy: Secondary | ICD-10-CM | POA: Insufficient documentation

## 2016-01-16 DIAGNOSIS — E785 Hyperlipidemia, unspecified: Secondary | ICD-10-CM | POA: Insufficient documentation

## 2016-01-16 DIAGNOSIS — I129 Hypertensive chronic kidney disease with stage 1 through stage 4 chronic kidney disease, or unspecified chronic kidney disease: Secondary | ICD-10-CM | POA: Insufficient documentation

## 2016-01-16 DIAGNOSIS — M810 Age-related osteoporosis without current pathological fracture: Secondary | ICD-10-CM | POA: Insufficient documentation

## 2016-01-16 DIAGNOSIS — M25571 Pain in right ankle and joints of right foot: Secondary | ICD-10-CM | POA: Diagnosis not present

## 2016-01-16 DIAGNOSIS — M79661 Pain in right lower leg: Secondary | ICD-10-CM | POA: Diagnosis not present

## 2016-01-16 DIAGNOSIS — R279 Unspecified lack of coordination: Secondary | ICD-10-CM | POA: Diagnosis not present

## 2016-01-16 DIAGNOSIS — Z7401 Bed confinement status: Secondary | ICD-10-CM | POA: Diagnosis not present

## 2016-01-16 DIAGNOSIS — Y939 Activity, unspecified: Secondary | ICD-10-CM | POA: Diagnosis not present

## 2016-01-16 DIAGNOSIS — J449 Chronic obstructive pulmonary disease, unspecified: Secondary | ICD-10-CM | POA: Insufficient documentation

## 2016-01-16 DIAGNOSIS — W228XXA Striking against or struck by other objects, initial encounter: Secondary | ICD-10-CM | POA: Diagnosis not present

## 2016-01-16 DIAGNOSIS — M1990 Unspecified osteoarthritis, unspecified site: Secondary | ICD-10-CM | POA: Diagnosis not present

## 2016-01-16 DIAGNOSIS — Y999 Unspecified external cause status: Secondary | ICD-10-CM | POA: Diagnosis not present

## 2016-01-16 DIAGNOSIS — M79604 Pain in right leg: Secondary | ICD-10-CM | POA: Diagnosis not present

## 2016-01-16 DIAGNOSIS — I4891 Unspecified atrial fibrillation: Secondary | ICD-10-CM | POA: Insufficient documentation

## 2016-01-16 HISTORY — DX: Long term (current) use of anticoagulants: Z79.01

## 2016-01-16 HISTORY — DX: Dependence on wheelchair: Z99.3

## 2016-01-16 LAB — CBC WITH DIFFERENTIAL/PLATELET
BASOS PCT: 0 %
Basophils Absolute: 0 10*3/uL (ref 0.0–0.1)
EOS ABS: 0.2 10*3/uL (ref 0.0–0.7)
Eosinophils Relative: 3 %
HCT: 40.3 % (ref 39.0–52.0)
HEMOGLOBIN: 13.1 g/dL (ref 13.0–17.0)
LYMPHS ABS: 2.2 10*3/uL (ref 0.7–4.0)
Lymphocytes Relative: 32 %
MCH: 30.6 pg (ref 26.0–34.0)
MCHC: 32.5 g/dL (ref 30.0–36.0)
MCV: 94.2 fL (ref 78.0–100.0)
MONO ABS: 0.5 10*3/uL (ref 0.1–1.0)
MONOS PCT: 7 %
Neutro Abs: 3.9 10*3/uL (ref 1.7–7.7)
Neutrophils Relative %: 58 %
Platelets: 151 10*3/uL (ref 150–400)
RBC: 4.28 MIL/uL (ref 4.22–5.81)
RDW: 15.8 % — ABNORMAL HIGH (ref 11.5–15.5)
WBC: 6.8 10*3/uL (ref 4.0–10.5)

## 2016-01-16 LAB — BASIC METABOLIC PANEL
Anion gap: 5 (ref 5–15)
BUN: 16 mg/dL (ref 6–20)
CALCIUM: 9.3 mg/dL (ref 8.9–10.3)
CO2: 32 mmol/L (ref 22–32)
CREATININE: 0.85 mg/dL (ref 0.61–1.24)
Chloride: 103 mmol/L (ref 101–111)
GFR calc Af Amer: >60 mL/min (ref >60)
GLUCOSE: 79 mg/dL (ref 65–99)
Potassium: 3.5 mmol/L (ref 3.5–5.1)
Sodium: 140 mmol/L (ref 135–145)

## 2016-01-16 LAB — LACTIC ACID, PLASMA: Lactic Acid, Venous: 0.9 mmol/L (ref 0.5–2.0)

## 2016-01-16 MED ORDER — HYDROCODONE-ACETAMINOPHEN 5-325 MG PO TABS
1.0000 | ORAL_TABLET | Freq: Once | ORAL | Status: AC
  Administered 2016-01-16: 1 via ORAL
  Filled 2016-01-16: qty 1

## 2016-01-16 MED ORDER — DOXYCYCLINE HYCLATE 100 MG PO TABS: 100.0000 mg | ORAL_TABLET | Freq: Two times a day (BID) | ORAL | Status: DC

## 2016-01-16 NOTE — Discharge Instructions (Signed)
Take the prescription as directed. Elevate your leg as much as possible. Wear the cam walker until you are seen in follow up. Return to the hospital in the morning to obtain an outpatient ultrasound of your leg.  This ultrasound will check to make sure you do not have a "blood clot" in the veins in your leg. Call your regular medical doctor and the Orthopedic doctor on Monday to schedule a follow up appointment within the next 3 days.  Return to the Emergency Department immediately if worsening.

## 2016-01-16 NOTE — ED Provider Notes (Signed)
CSN: [id number]     Arrival date & time 01/16/16  1810 History   First MD Initiated Contact with Patient 01/16/16 1831     Chief Complaint  Patient presents with  . Leg Pain     HPI Pt was seen at 1840. Per pt and his family, c/o gradual onset and persistence of constant right lower leg, ankle, foot "pain" for the past 5 days. Pt states the pain began after he hit his foot against a door frame. Pt endorses hx of DVT, compliance with xarelto; but pt's wife states she is worried regarding same as cause for his symptoms today. Denies fevers, no focal motor weakness, no back pain, no abd pain, no other injuries.    Past Medical History  Diagnosis Date  . Atrial fibrillation ([place])     Postop hip surgery, January 2012, digoxin and diltiazem, reverted to sinus in the hospital  . Osteoarthritis   . COPD (chronic obstructive pulmonary disease) (HCC)     Emphysema  . Depression   . Posttraumatic stress disorder   . GERD (gastroesophageal reflux disease)   . Irritable bowel   . BPH (benign prostatic hyperplasia)   . Urinary tract bacterial infections   . Osteoporosis   . Hypertension   . Dyslipidemia   . CAD (coronary artery disease)     Stents placed in [place] and [place] in the past  /Myoview 2008, no ischemia, /   . Atrial flutter ([place])     History of the past with syncope and  ?? H. and consideration, no records  . 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 Hip pain   . Shoulder pain   . Gout   . Peripheral arterial disease ([place])     Dopplers Dr.Vyas office October, 2010, apparent occlusion anterior tibial arteries bilaterally., findings suggest hemodynamically significant stenosis of the proximal right provisional femoral artery and the mid left superficial artery.  Ankle-brachial indices indicate moderate stenosis within both lower extremities  . Chronic kidney disease     Creatinine 2.1, October 13, 2010--> 0.74 10/2012  . Hyperlipidemia     Mixed  . GI bleed   .  Ejection fraction     EF 50-55%, echo, May, 2013, severe inferior hypokinesis  . Bilateral pulmonary embolism ([place])     a. 10/2012 - V:Q scan high prob for Bilat PE in setting of dyspnea/hemoptysis-->anticoagulation started.  . Right leg DVT ([place])     a. 10/2012 - subocclusive DVT R popliteal vein.  Marland Kitchen COPD (chronic obstructive pulmonary disease) ([place])   . Chronic pain     bilat hips, right shoulder, RLE  . PE (pulmonary embolism)   . Chronic anticoagulation   . Wheelchair bound    Past Surgical History  Procedure Laterality Date  . Knee arthroplasty      Right  . Total knee arthroplasty      Left  . Total hip arthroplasty      Right-complicated hx  . Colon resection    . Back surgery      [place]  . Prostate surgery    . Colonoscopy N/A 03/13/2013    Procedure: COLONOSCOPY;  Surgeon: Rogene Houston, MD;  [facility];  Service: Endoscopy;  Laterality: N/A;  730  . Cataract extraction    . Colonoscopy N/A 12/22/2014    Procedure: COLONOSCOPY;  Surgeon: Rogene Houston, MD;  Location: [facility];  Service: Endoscopy;  Laterality: N/A;  . Givens capsule  study N/A 12/24/2014    Procedure: GIVENS CAPSULE STUDY;  Surgeon: Rogene Houston, MD;  Location: [facility];  Service: Endoscopy;  Laterality: N/A;  . Coronary angioplasty with stent placement      x 4   Family History  Problem Relation Age of Onset  . Diabetes Daughter   . Hypertension Daughter   . Obesity Daughter    Social History  Substance Use Topics  . Smoking status: Former Smoker -- 1.00 packs/day for 30 years    Types: Cigarettes    Quit date: 08/16/2006  . Smokeless tobacco: Never Used     Comment: Patient smoked a pack a day for about 30 years  . Alcohol Use: No    Review of Systems ROS: Statement: All systems negative except as marked or noted in the HPI; Constitutional: Negative for fever and chills. ; ; Eyes: Negative for eye pain, redness and discharge. ; ; ENMT: Negative for ear  pain, hoarseness, nasal congestion, sinus pressure and sore throat. ; ; Cardiovascular: Negative for chest pain, palpitations, diaphoresis, dyspnea. ; ; Respiratory: Negative for cough, wheezing and stridor. ; ; Gastrointestinal: Negative for nausea, vomiting, diarrhea, abdominal pain, blood in stool, hematemesis, jaundice and rectal bleeding. . ; ; Genitourinary: Negative for dysuria, flank pain and hematuria. ; ; Musculoskeletal: +right lower leg, ankle, foot pain. Negative for back pain and neck pain. Negative for deformity.; ; Skin: Negative for pruritus, rash, abrasions, blisters, bruising and skin lesion.; ; Neuro: Negative for headache, lightheadedness and neck stiffness. Negative for weakness, altered level of consciousness, altered mental status, extremity weakness, paresthesias, involuntary movement, seizure and syncope.     Allergies  Omeprazole; Other; Penicillins; Sulfonamide derivatives; and Iodine  Home Medications   Prior to Admission medications   Medication Sig Start Date End Date Taking? Authorizing Provider  allopurinol (ZYLOPRIM) 100 MG tablet Take 100 mg by mouth every evening.     Historical Provider, MD  ascorbic acid (VITAMIN C) 500 MG tablet Take 500 mg by mouth daily.     Historical Provider, MD  atorvastatin (LIPITOR) 80 MG tablet Take 80 mg by mouth every evening.     Historical Provider, MD  calcium-vitamin D (OSCAL WITH D) 500-200 MG-UNIT per tablet Take 1 tablet by mouth daily.     Historical Provider, MD  cilostazol (PLETAL) 100 MG tablet Take 1 tablet by mouth daily. 07/07/15   Historical Provider, MD  COLCRYS 0.6 MG tablet Take 1 tablet by mouth daily as needed (gout pain).  07/07/15   Historical Provider, MD  diclofenac sodium (VOLTAREN) 1 % GEL Apply 1 application topically daily. Applied to feet 05/20/15   Historical Provider, MD  diltiazem (CARDIZEM CD) 120 MG 24 hr capsule Take 1 capsule (120 mg total) by mouth daily. As directed based off blood pressure  readings 09/22/15   Satira Sark, MD  fenofibrate (TRICOR) 145 MG tablet Take 1 tablet (145 mg total) by mouth daily. Patient taking differently: Take 145 mg by mouth every evening.  08/16/11   Ezra Sites, MD  fentaNYL (DURAGESIC - DOSED MCG/HR) 75 MCG/HR Place 75 mcg onto the skin every 3 (three) days. Change patch every 3 days 08/27/15   Historical Provider, MD  ferrous sulfate 325 (65 FE) MG tablet Take 325 mg by mouth daily with breakfast.    Historical Provider, MD  finasteride (PROSCAR) 5 MG tablet Take 5 mg by mouth daily.      Historical Provider, MD  furosemide (LASIX) 40  MG tablet Take 40 mg by mouth every evening. *May take 80mg  as needed for swelling    Historical Provider, MD  HYDROcodone-acetaminophen (NORCO/VICODIN) 5-325 MG per tablet Take 1-2 tablets by mouth every 6 (six) hours as needed for moderate pain or severe pain.  11/18/14   Historical Provider, MD  lidocaine (LIDODERM) 5 % Place 1 patch onto the skin daily as needed (for pain). Every 12 hours as needed 08/29/15   Historical Provider, MD  magnesium oxide (MAG-OX) 400 (241.3 MG) MG tablet Take 1 tablet (400 mg total) by mouth 2 (two) times daily. 01/06/15   Reyne Dumas, MD  metoCLOPramide (REGLAN) 10 MG tablet Take 1 tablet (10 mg total) by mouth daily. 05/01/12   Rogene Houston, MD  metoprolol (LOPRESSOR) 100 MG tablet Take 1 tablet (100 mg total) by mouth 2 (two) times daily. 02/07/12   Donney Dice, PA-C  metroNIDAZOLE (FLAGYL) 500 MG tablet Take 1 tablet (500 mg total) by mouth 3 (three) times daily. 01/06/16   Butch Penny, NP  Multiple Vitamin (MULTIVITAMIN) tablet Take 1 tablet by mouth daily.      Historical Provider, MD  nitroGLYCERIN (NITROSTAT) 0.4 MG SL tablet Place 1 tablet (0.4 mg total) under the tongue every 5 (five) minutes x 3 doses as needed. 01/07/15   Carlena Bjornstad, MD  Omega-3 Fatty Acids (FISH OIL) 1000 MG CAPS Take 2 capsules by mouth daily.     Historical Provider, MD  pantoprazole (PROTONIX) 40  MG tablet Take 40 mg by mouth daily.      Historical Provider, MD  potassium chloride SA (K-DUR,KLOR-CON) 20 MEQ tablet Take 2 tablets (40 mEq total) by mouth daily. Patient taking differently: Take 20 mEq by mouth daily. *Give additional tablet if Lasix is increased due to swelling 01/05/15   Reyne Dumas, MD  PROAIR HFA 108 (90 Base) MCG/ACT inhaler Inhale 2 puffs into the lungs 2 (two) times daily as needed for wheezing or shortness of breath. 2 puffs twice daily 08/20/15   Historical Provider, MD  psyllium (METAMUCIL SMOOTH TEXTURE) 28 % packet Take 1 packet by mouth at bedtime. Patient taking differently: Take 1 packet by mouth at bedtime as needed (bowels).  03/13/13   Rogene Houston, MD  rivaroxaban (XARELTO) 20 MG TABS tablet Take 1 tablet (20 mg total) by mouth daily. 01/27/15   Reyne Dumas, MD  rivastigmine (EXELON) 9.5 mg/24hr Place 1 patch (9.5 mg total) onto the skin daily. 10/06/15   Cameron Sprang, MD  tamsulosin (FLOMAX) 0.4 MG CAPS capsule Take 0.4 mg by mouth daily after supper.    Historical Provider, MD   BP 132/96 mmHg  Pulse 81  Temp(Src) 98.6 F (37 C) (Oral)  Resp 20  Ht 6\' 2"  (1.88 m)  Wt 296 lb (134.265 kg)  BMI 37.99 kg/m2  SpO2 99% Physical Exam  1845: Physical examination:  Nursing notes reviewed; Vital signs and O2 SAT reviewed;  Constitutional: Well developed, Well nourished, Well hydrated, In no acute distress; Head:  Normocephalic, atraumatic; Eyes: EOMI, PERRL, No scleral icterus; ENMT: Mouth and pharynx normal, Mucous membranes moist; Neck: Supple, Full range of motion, No lymphadenopathy; Cardiovascular: Regular rate and rhythm, No gallop; Respiratory: Breath sounds clear & equal bilaterally, No wheezes.  Speaking full sentences with ease, Normal respiratory effort/excursion; Chest: Nontender, Movement normal; Abdomen: Soft, Nontender, Nondistended, Normal bowel sounds; Genitourinary: No CVA tenderness; Extremities: Pulses normal, No deformity. +mild edema right  foot, ankle, and lower leg with localized TTP  and mild erythema laterally. No open wounds, no ecchymosis. Muscles compartments soft. NT right knee, NT right calf.;;.  Neuro: AA&Ox3, Major CN grossly intact.  Speech clear. No gross focal motor deficits in extremities.; Skin: Color normal, Warm, Dry.   ED Course  Procedures (including critical care time) Labs Review   Imaging Review  I have personally reviewed and evaluated these images and lab results as part of my medical decision-making.   EKG Interpretation None      MDM  MDM Reviewed: previous chart, nursing note and vitals Reviewed previous: labs Interpretation: labs and x-ray     Results for orders placed or performed during the hospital encounter of [id number]  Basic metabolic panel  Result Value Ref Range   Sodium 140 135 - 145 mmol/L   Potassium 3.5 3.5 - 5.1 mmol/L   Chloride 103 101 - 111 mmol/L   CO2 32 22 - 32 mmol/L   Glucose, Bld 79 65 - 99 mg/dL   BUN 16 6 - 20 mg/dL   Creatinine, Ser 0.85 0.61 - 1.24 mg/dL   Calcium 9.3 8.9 - 10.3 mg/dL   GFR calc non Af Amer >60 >60 mL/min   GFR calc Af Amer >60 >60 mL/min   Anion gap 5 5 - 15  Lactic acid, plasma  Result Value Ref Range   Lactic Acid, Venous 0.9 0.5 - 2.0 mmol/L  CBC with Differential  Result Value Ref Range   WBC 6.8 4.0 - 10.5 K/uL   RBC 4.28 4.22 - 5.81 MIL/uL   Hemoglobin 13.1 13.0 - 17.0 g/dL   HCT 40.3 39.0 - 52.0 %   MCV 94.2 78.0 - 100.0 fL   MCH 30.6 26.0 - 34.0 pg   MCHC 32.5 30.0 - 36.0 g/dL   RDW 15.8 (H) 11.5 - 15.5 %   Platelets 151 150 - 400 K/uL   Neutrophils Relative % 58 %   Neutro Abs 3.9 1.7 - 7.7 K/uL   Lymphocytes Relative 32 %   Lymphs Abs 2.2 0.7 - 4.0 K/uL   Monocytes Relative 7 %   Monocytes Absolute 0.5 0.1 - 1.0 K/uL   Eosinophils Relative 3 %   Eosinophils Absolute 0.2 0.0 - 0.7 K/uL   Basophils Relative 0 %   Basophils Absolute 0.0 0.0 - 0.1 K/uL   Dg Tibia/fibula Right 01/16/2016  CLINICAL DATA:  Hit right  leg on door frame 4 days ago. Right leg pain. Initial encounter. EXAM: RIGHT TIBIA AND FIBULA - 2 VIEW COMPARISON:  None. FINDINGS: There is no evidence of acute fracture or other focal bone lesions. Right knee prosthesis seen in expected position. Generalized osteopenia noted. Extensive peripheral vascular calcification also demonstrated. IMPRESSION: No acute findings. Electronically Signed   By: Earle Gell M.D.   On: 01/16/2016 20:12   Dg Ankle Complete Right 01/16/2016  ADDENDUM REPORT: 01/16/2016 20:13 ADDENDUM: Upon further review, there is a subtle cortical step-off seen involving the distal fibula just above the level of tibial plafond, below the old fracture deformity. This is consistent with an acute nondisplaced fracture of the distal fibula. Electronically Signed   By: Earle Gell M.D.   On: 01/16/2016 20:13  01/16/2016  CLINICAL DATA:  Hit right ankle on door frame 4 days ago. Right ankle pain. Initial encounter. EXAM: RIGHT ANKLE - COMPLETE 3+ VIEW COMPARISON:  None. FINDINGS: There is no evidence of acute fracture, dislocation, or joint effusion. Old fracture deformity of the distal fibula noted. Diffuse osteopenia also demonstrated as well  as peripheral vascular calcification. IMPRESSION: No acute findings. Old fracture deformity of distal fibula and diffuse osteopenia. Electronically Signed: By: Earle Gell M.D. On: 01/16/2016 20:09   Dg Foot Complete Right 01/16/2016  CLINICAL DATA:  Hit right foot on door frame 4 days ago. Persistent right foot pain. Initial encounter. EXAM: RIGHT FOOT COMPLETE - 3+ VIEW COMPARISON:  None. FINDINGS: Generalized osteopenia noted. Fracture deformity of the distal fifth metatarsal is seen which appears acute on the oblique projection. No other fractures are identified. No evidence of dislocation. Hindfoot osteoarthritis noted as well as peripheral vascular calcification. IMPRESSION: Diffuse osteopenia. Fracture deformity of the distal fifth metatarsal appears acute  on the oblique projection ; recommend clinical correlation for point tenderness at this site. Electronically Signed   By: Earle Gell M.D.   On: 01/16/2016 20:08    2030:  XR as above; pt not ambulatory at baseline, will place in cam walker, f/u Ortho MD. Will tx abx for mild erythema right lateral ankle/lower leg. Pt's wife concerned regarding "another blood clot;" pt is already on xarelto, will Vasc US tomorrow morning as outpatient. Pt states he's ready to go home now. Dx and testing d/w pt and family.  Questions answered.  Verb understanding, agreeable to d/c home with outpt f/u.   Francine Graven, DO 01/17/16 2030

## 2016-01-16 NOTE — ED Notes (Signed)
Pt c/o pain in r foot and calf since Monday.  Reports history of blood clots.  EMS reports palpable pedal pulses but states r leg feels slightly warmer than left.  Pt says he hit his foot Monday but thought the pain would pass.

## 2016-01-17 ENCOUNTER — Encounter (HOSPITAL_COMMUNITY): Payer: Self-pay

## 2016-01-17 ENCOUNTER — Emergency Department (HOSPITAL_COMMUNITY)
Admit: 2016-01-17 | Discharge: 2016-01-17 | Disposition: A | Discharge status: Home / Self Care | Payer: Medicare Other | Attending: Emergency Medicine | Admitting: Emergency Medicine

## 2016-01-17 ENCOUNTER — Emergency Department (HOSPITAL_COMMUNITY)
Admission: EM | Admit: 2016-01-17 | Discharge: 2016-01-17 | Disposition: A | Discharge status: Home / Self Care | Payer: Medicare Other | Attending: Emergency Medicine | Admitting: Emergency Medicine

## 2016-01-17 DIAGNOSIS — I825Y9 Chronic embolism and thrombosis of unspecified deep veins of unspecified proximal lower extremity: Secondary | ICD-10-CM

## 2016-01-17 DIAGNOSIS — I82409 Acute embolism and thrombosis of unspecified deep veins of unspecified lower extremity: Secondary | ICD-10-CM | POA: Diagnosis not present

## 2016-01-17 DIAGNOSIS — N189 Chronic kidney disease, unspecified: Secondary | ICD-10-CM | POA: Diagnosis not present

## 2016-01-17 DIAGNOSIS — Z743 Need for continuous supervision: Secondary | ICD-10-CM | POA: Diagnosis not present

## 2016-01-17 DIAGNOSIS — M1990 Unspecified osteoarthritis, unspecified site: Secondary | ICD-10-CM | POA: Insufficient documentation

## 2016-01-17 DIAGNOSIS — I4891 Unspecified atrial fibrillation: Secondary | ICD-10-CM | POA: Insufficient documentation

## 2016-01-17 DIAGNOSIS — E785 Hyperlipidemia, unspecified: Secondary | ICD-10-CM | POA: Insufficient documentation

## 2016-01-17 DIAGNOSIS — Z87891 Personal history of nicotine dependence: Secondary | ICD-10-CM | POA: Insufficient documentation

## 2016-01-17 DIAGNOSIS — R404 Transient alteration of awareness: Secondary | ICD-10-CM | POA: Diagnosis not present

## 2016-01-17 DIAGNOSIS — R279 Unspecified lack of coordination: Secondary | ICD-10-CM | POA: Diagnosis not present

## 2016-01-17 DIAGNOSIS — M79671 Pain in right foot: Secondary | ICD-10-CM | POA: Diagnosis not present

## 2016-01-17 DIAGNOSIS — I82501 Chronic embolism and thrombosis of unspecified deep veins of right lower extremity: Secondary | ICD-10-CM | POA: Diagnosis not present

## 2016-01-17 DIAGNOSIS — F329 Major depressive disorder, single episode, unspecified: Secondary | ICD-10-CM | POA: Diagnosis not present

## 2016-01-17 DIAGNOSIS — J449 Chronic obstructive pulmonary disease, unspecified: Secondary | ICD-10-CM | POA: Diagnosis not present

## 2016-01-17 DIAGNOSIS — I129 Hypertensive chronic kidney disease with stage 1 through stage 4 chronic kidney disease, or unspecified chronic kidney disease: Secondary | ICD-10-CM | POA: Insufficient documentation

## 2016-01-17 DIAGNOSIS — I2510 Atherosclerotic heart disease of native coronary artery without angina pectoris: Secondary | ICD-10-CM | POA: Diagnosis not present

## 2016-01-17 DIAGNOSIS — Z79899 Other long term (current) drug therapy: Secondary | ICD-10-CM | POA: Diagnosis not present

## 2016-01-17 DIAGNOSIS — R531 Weakness: Secondary | ICD-10-CM | POA: Diagnosis not present

## 2016-01-17 DIAGNOSIS — M7989 Other specified soft tissue disorders: Secondary | ICD-10-CM | POA: Diagnosis not present

## 2016-01-17 MED ORDER — HYDROCODONE-ACETAMINOPHEN 5-325 MG PO TABS
2.0000 | ORAL_TABLET | Freq: Once | ORAL | Status: AC
  Administered 2016-01-17: 2 via ORAL
  Filled 2016-01-17: qty 2

## 2016-01-17 NOTE — ED Notes (Signed)
Pt states he was treated here yesterday for a broken right foot. States he  was told to come back today for an [place] of his leg to rule out a blood clot

## 2016-01-17 NOTE — ED Provider Notes (Signed)
CSN: [id number]     Arrival date & time 01/17/16  [id number] History  By signing my name below, I, Rowan Blase, attest that this documentation has been prepared under the direction and in the presence of Noemi Chapel, MD . Electronically Signed: Rowan Blase, Scribe. 01/17/2016. 9:01 AM.   Chief Complaint  Patient presents with  . Foot Pain    The history is provided by the patient. No language interpreter was used.   HPI Comments:  Bryce Berry is a 75 y.o. male with PMHx of PE, DVT, CAD, HTN, HLD and CKD who presents to the Emergency Department for a follow-up US. Pt reports he was evaluated yesterday for fractures in his right small toe and right fibula; his right foot was placed in a boot. Pt's family member is concerned for a blood clot as the pt has a h/o DVT while taking Zyrelto. He is returning today seeking an [place] to rule out a blood clot. Pt injured his leg when he lost control of his wheelchair while going down a ramp, striking a door frame. Pt reports tingling and moderate pain in his right leg currently. Denies chest pain or shortness of breath.   Past Medical History  Diagnosis Date  . Atrial fibrillation ([place])     Postop hip surgery, January 2012, digoxin and diltiazem, reverted to sinus in the hospital  . Osteoarthritis   . COPD (chronic obstructive pulmonary disease) (HCC)     Emphysema  . Depression   . Posttraumatic stress disorder   . GERD (gastroesophageal reflux disease)   . Irritable bowel   . BPH (benign prostatic hyperplasia)   . Urinary tract bacterial infections   . Osteoporosis   . Hypertension   . Dyslipidemia   . CAD (coronary artery disease)     Stents placed in [place] and [place] in the past  /Myoview 2008, no ischemia, /   . Atrial flutter ([place])     History of the past with syncope and  ?? H. and consideration, no records  . 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 Hip pain   . Shoulder pain   . Gout   . Peripheral arterial  disease ([place])     Dopplers Dr.Vyas office October, 2010, apparent occlusion anterior tibial arteries bilaterally., findings suggest hemodynamically significant stenosis of the proximal right provisional femoral artery and the mid left superficial artery.  Ankle-brachial indices indicate moderate stenosis within both lower extremities  . Chronic kidney disease     Creatinine 2.1, October 13, 2010--> 0.74 10/2012  . Hyperlipidemia     Mixed  . GI bleed   . Ejection fraction     EF 50-55%, echo, May, 2013, severe inferior hypokinesis  . Bilateral pulmonary embolism ([place])     a. 10/2012 - V:Q scan high prob for Bilat PE in setting of dyspnea/hemoptysis-->anticoagulation started.  . Right leg DVT ([place])     a. 10/2012 - subocclusive DVT R popliteal vein.  Marland Kitchen COPD (chronic obstructive pulmonary disease) ([place])   . Chronic pain     bilat hips, right shoulder, RLE  . PE (pulmonary embolism)   . Chronic anticoagulation   . Wheelchair bound    Past Surgical History  Procedure Laterality Date  . Knee arthroplasty      Right  . Total knee arthroplasty      Left  . Total hip arthroplasty      Right-complicated hx  . Colon resection    . Back  surgery      [place]  . Prostate surgery    . Colonoscopy N/A 03/13/2013    Procedure: COLONOSCOPY;  Surgeon: Rogene Houston, MD;  [facility];  Service: Endoscopy;  Laterality: N/A;  730  . Cataract extraction    . Colonoscopy N/A 12/22/2014    Procedure: COLONOSCOPY;  Surgeon: Rogene Houston, MD;  Location: [facility];  Service: Endoscopy;  Laterality: N/A;  . Givens capsule study N/A 12/24/2014    Procedure: GIVENS CAPSULE STUDY;  Surgeon: Rogene Houston, MD;  Location: [facility];  Service: Endoscopy;  Laterality: N/A;  . Coronary angioplasty with stent placement      x 4   Family History  Problem Relation Age of Onset  . Diabetes Daughter   . Hypertension Daughter   . Obesity Daughter    Social History  Substance Use  Topics  . Smoking status: Former Smoker -- 1.00 packs/day for 30 years    Types: Cigarettes    Quit date: 08/16/2006  . Smokeless tobacco: Never Used     Comment: Patient smoked a pack a day for about 30 years  . Alcohol Use: No    Review of Systems  Respiratory: Negative for shortness of breath.   Cardiovascular: Negative for chest pain.  Musculoskeletal: Positive for arthralgias.  Neurological: Positive for numbness (tingling).    Allergies  Omeprazole; Other; Penicillins; Sulfonamide derivatives; and Iodine  Home Medications   Prior to Admission medications   Medication Sig Start Date End Date Taking? Authorizing Provider  allopurinol (ZYLOPRIM) 100 MG tablet Take 100 mg by mouth every evening.     Historical Provider, MD  ascorbic acid (VITAMIN C) 500 MG tablet Take 500 mg by mouth daily.     Historical Provider, MD  atorvastatin (LIPITOR) 80 MG tablet Take 80 mg by mouth every evening.     Historical Provider, MD  calcium-vitamin D (OSCAL WITH D) 500-200 MG-UNIT per tablet Take 1 tablet by mouth daily.     Historical Provider, MD  cilostazol (PLETAL) 100 MG tablet Take 1 tablet by mouth daily. 07/07/15   Historical Provider, MD  COLCRYS 0.6 MG tablet Take 1 tablet by mouth daily as needed (gout pain).  07/07/15   Historical Provider, MD  diclofenac sodium (VOLTAREN) 1 % GEL Apply 1 application topically daily. Applied to feet 05/20/15   Historical Provider, MD  diltiazem (CARDIZEM CD) 120 MG 24 hr capsule Take 1 capsule (120 mg total) by mouth daily. As directed based off blood pressure readings 09/22/15   Satira Sark, MD  doxycycline (VIBRA-TABS) 100 MG tablet Take 1 tablet (100 mg total) by mouth 2 (two) times daily. 01/16/16   Francine Graven, DO  fenofibrate (TRICOR) 145 MG tablet Take 1 tablet (145 mg total) by mouth daily. Patient taking differently: Take 145 mg by mouth every evening.  08/16/11   Ezra Sites, MD  fentaNYL (DURAGESIC - DOSED MCG/HR) 75 MCG/HR Place 75  mcg onto the skin every 3 (three) days. Change patch every 3 days 08/27/15   Historical Provider, MD  ferrous sulfate 325 (65 FE) MG tablet Take 325 mg by mouth daily with breakfast.    Historical Provider, MD  finasteride (PROSCAR) 5 MG tablet Take 5 mg by mouth daily.      Historical Provider, MD  furosemide (LASIX) 40 MG tablet Take 40 mg by mouth every evening. *May take 80mg  as needed for swelling    Historical Provider, MD  HYDROcodone-acetaminophen (NORCO/VICODIN) 5-325 MG per tablet Take 1-2 tablets by mouth every 6 (six) hours as needed for moderate pain or severe pain.  11/18/14   Historical Provider, MD  lidocaine (LIDODERM) 5 % Place 1 patch onto the skin daily as needed (for pain). Every 12 hours as needed 08/29/15   Historical Provider, MD  magnesium oxide (MAG-OX) 400 (241.3 MG) MG tablet Take 1 tablet (400 mg total) by mouth 2 (two) times daily. 01/06/15   Reyne Dumas, MD  metoCLOPramide (REGLAN) 10 MG tablet Take 1 tablet (10 mg total) by mouth daily. 05/01/12   Rogene Houston, MD  metoprolol (LOPRESSOR) 100 MG tablet Take 1 tablet (100 mg total) by mouth 2 (two) times daily. 02/07/12   Donney Dice, PA-C  metroNIDAZOLE (FLAGYL) 500 MG tablet Take 1 tablet (500 mg total) by mouth 3 (three) times daily. Patient not taking: Reported on 01/16/2016 01/06/16   Butch Penny, NP  Multiple Vitamin (MULTIVITAMIN) tablet Take 1 tablet by mouth daily.      Historical Provider, MD  nitroGLYCERIN (NITROSTAT) 0.4 MG SL tablet Place 1 tablet (0.4 mg total) under the tongue every 5 (five) minutes x 3 doses as needed. 01/07/15   Carlena Bjornstad, MD  Omega-3 Fatty Acids (FISH OIL) 1000 MG CAPS Take 2 capsules by mouth daily.     Historical Provider, MD  pantoprazole (PROTONIX) 40 MG tablet Take 40 mg by mouth daily.      Historical Provider, MD  potassium chloride SA (K-DUR,KLOR-CON) 20 MEQ tablet Take 2 tablets (40 mEq total) by mouth daily. Patient taking differently: Take 20 mEq by mouth daily. *Give  additional tablet if Lasix is increased due to swelling 01/05/15   Reyne Dumas, MD  PROAIR HFA 108 (90 Base) MCG/ACT inhaler Inhale 2 puffs into the lungs 2 (two) times daily as needed for wheezing or shortness of breath. 2 puffs twice daily 08/20/15   Historical Provider, MD  psyllium (METAMUCIL SMOOTH TEXTURE) 28 % packet Take 1 packet by mouth at bedtime. Patient taking differently: Take 1 packet by mouth at bedtime as needed (bowels).  03/13/13   Rogene Houston, MD  rivaroxaban (XARELTO) 20 MG TABS tablet Take 1 tablet (20 mg total) by mouth daily. 01/27/15   Reyne Dumas, MD  rivastigmine (EXELON) 9.5 mg/24hr Place 1 patch (9.5 mg total) onto the skin daily. 10/06/15   Cameron Sprang, MD  tamsulosin (FLOMAX) 0.4 MG CAPS capsule Take 0.4 mg by mouth daily after supper.    Historical Provider, MD   BP 158/94 mmHg  Pulse 86  Temp(Src) 98 F (36.7 C) (Oral)  Resp 18  SpO2 95%   Physical Exam  Constitutional: He appears well-developed and well-nourished.  HENT:  Head: Normocephalic and atraumatic.  Eyes: Conjunctivae are normal. Right eye exhibits no discharge. Left eye exhibits no discharge.  Pulmonary/Chest: Effort normal. No respiratory distress.  Musculoskeletal: He exhibits no edema.  No edema of RLE; pt has boot on right leg  Neurological: He is alert. Coordination normal.  Skin: Skin is warm and dry. No rash noted. He is not diaphoretic. No erythema.  Psychiatric: He has a normal mood and affect.  Nursing note and vitals reviewed.  ED Course  Procedures  DIAGNOSTIC STUDIES:  Oxygen Saturation is 95% on RA, normal by my interpretation.    COORDINATION OF CARE:  8:48 AM Will administer pain medication and order [place]. Discussed treatment plan with pt at bedside and pt agreed to plan.  Labs  Review Labs Reviewed - No data to display  Imaging Review Dg Tibia/fibula Right  01/16/2016  CLINICAL DATA:  Hit right leg on door frame 4 days ago. Right leg pain. Initial encounter. EXAM:  RIGHT TIBIA AND FIBULA - 2 VIEW COMPARISON:  None. FINDINGS: There is no evidence of acute fracture or other focal bone lesions. Right knee prosthesis seen in expected position. Generalized osteopenia noted. Extensive peripheral vascular calcification also demonstrated. IMPRESSION: No acute findings. Electronically Signed   By: Earle Gell M.D.   On: 01/16/2016 20:12   Dg Ankle Complete Right  01/16/2016  ADDENDUM REPORT: 01/16/2016 20:13 ADDENDUM: Upon further review, there is a subtle cortical step-off seen involving the distal fibula just above the level of tibial plafond, below the old fracture deformity. This is consistent with an acute nondisplaced fracture of the distal fibula. Electronically Signed   By: Earle Gell M.D.   On: 01/16/2016 20:13  01/16/2016  CLINICAL DATA:  Hit right ankle on door frame 4 days ago. Right ankle pain. Initial encounter. EXAM: RIGHT ANKLE - COMPLETE 3+ VIEW COMPARISON:  None. FINDINGS: There is no evidence of acute fracture, dislocation, or joint effusion. Old fracture deformity of the distal fibula noted. Diffuse osteopenia also demonstrated as well as peripheral vascular calcification. IMPRESSION: No acute findings. Old fracture deformity of distal fibula and diffuse osteopenia. Electronically Signed: By: Earle Gell M.D. On: 01/16/2016 20:09   Dg Foot Complete Right  01/16/2016  CLINICAL DATA:  Hit right foot on door frame 4 days ago. Persistent right foot pain. Initial encounter. EXAM: RIGHT FOOT COMPLETE - 3+ VIEW COMPARISON:  None. FINDINGS: Generalized osteopenia noted. Fracture deformity of the distal fifth metatarsal is seen which appears acute on the oblique projection. No other fractures are identified. No evidence of dislocation. Hindfoot osteoarthritis noted as well as peripheral vascular calcification. IMPRESSION: Diffuse osteopenia. Fracture deformity of the distal fifth metatarsal appears acute on the oblique projection ; recommend clinical correlation for  point tenderness at this site. Electronically Signed   By: Earle Gell M.D.   On: 01/16/2016 20:08   I have personally reviewed and evaluated these images and lab results as part of my medical decision-making.   MDM   Final diagnoses:  None   I personally performed the services described in this documentation, which was scribed in my presence. The recorded information has been reviewed and is accurate.   [place] with chronic DVT fidnings - pt stable for d/c - informed of findings - on xarelto already.  Pt in agreement.  Noemi Chapel, MD 01/17/16 1113

## 2016-01-17 NOTE — ED Notes (Signed)
Transportation notified pt ready for transfer back home

## 2016-01-17 NOTE — ED Notes (Signed)
Called transport and they stated may be awhile before they can transport him home

## 2016-01-17 NOTE — ED Notes (Signed)
[facility] in to transport to patients home

## 2016-01-22 DIAGNOSIS — B351 Tinea unguium: Secondary | ICD-10-CM | POA: Diagnosis not present

## 2016-01-22 DIAGNOSIS — I70203 Unspecified atherosclerosis of native arteries of extremities, bilateral legs: Secondary | ICD-10-CM | POA: Diagnosis not present

## 2016-01-22 DIAGNOSIS — L84 Corns and callosities: Secondary | ICD-10-CM | POA: Diagnosis not present

## 2016-01-23 ENCOUNTER — Telehealth (INDEPENDENT_AMBULATORY_CARE_PROVIDER_SITE_OTHER): Payer: Self-pay | Admitting: Internal Medicine

## 2016-01-23 NOTE — Telephone Encounter (Signed)
Message left on phone at home

## 2016-01-23 NOTE — Telephone Encounter (Signed)
Patient's wife, Enid Derry called, concerned because her husband has had diarrhea since his last visit on 01/07/16.  She'd like to speak with Terri.  [phone]

## 2016-01-26 ENCOUNTER — Ambulatory Visit (INDEPENDENT_AMBULATORY_CARE_PROVIDER_SITE_OTHER): Payer: Medicare Other | Admitting: Orthopedic Surgery

## 2016-01-26 ENCOUNTER — Telehealth (INDEPENDENT_AMBULATORY_CARE_PROVIDER_SITE_OTHER): Payer: Self-pay | Admitting: Internal Medicine

## 2016-01-26 VITALS — BP 147/103 | Ht 74.0 in | Wt 296.0 lb

## 2016-01-26 DIAGNOSIS — S92301A Fracture of unspecified metatarsal bone(s), right foot, initial encounter for closed fracture: Secondary | ICD-10-CM

## 2016-01-26 DIAGNOSIS — S82401A Unspecified fracture of shaft of right fibula, initial encounter for closed fracture: Secondary | ICD-10-CM

## 2016-01-26 MED ORDER — HYDROCODONE-ACETAMINOPHEN 7.5-325 MG PO TABS: 1.0000 | ORAL_TABLET | ORAL | Status: DC | PRN

## 2016-01-26 NOTE — Telephone Encounter (Signed)
Patient called, stated that he was returning Bryce Berry's call from Friday.  [phone]

## 2016-01-26 NOTE — Telephone Encounter (Signed)
Imodium BID. Have 2-3 stools a day now and stools are more formed but still loose. I thin the main concern is the patient is incontinent.

## 2016-01-26 NOTE — Progress Notes (Signed)
Chief Complaint  Patient presents with  . Follow-up    [facility] ER follow up om ankle fracture, DOI 01-16-16.   HPI-75 year old male who is basically confined to a wheelchair injured his right ankle right foot secondary to wheelchair injury. Complains of severe right ankle pain and right fifth metatarsal pain nonradiating not relieved by Norco 5 mg for which she is on chronically  Review of Systems  Constitutional: Negative for fever and chills.  Musculoskeletal: Positive for myalgias and joint pain.    Past Medical History  Diagnosis Date  . Atrial fibrillation ([place])     Postop hip surgery, January 2012, digoxin and diltiazem, reverted to sinus in the hospital  . Osteoarthritis   . COPD (chronic obstructive pulmonary disease) (HCC)     Emphysema  . Depression   . Posttraumatic stress disorder   . GERD (gastroesophageal reflux disease)   . Irritable bowel   . BPH (benign prostatic hyperplasia)   . Urinary tract bacterial infections   . Osteoporosis   . Hypertension   . Dyslipidemia   . CAD (coronary artery disease)     Stents placed in [place] and [place] in the past  /Myoview 2008, no ischemia, /   . Atrial flutter ([place])     History of the past with syncope and  ?? H. and consideration, no records  . 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 Hip pain   . Shoulder pain   . Gout   . Peripheral arterial disease ([place])     Dopplers Dr.Vyas office October, 2010, apparent occlusion anterior tibial arteries bilaterally., findings suggest hemodynamically significant stenosis of the proximal right provisional femoral artery and the mid left superficial artery.  Ankle-brachial indices indicate moderate stenosis within both lower extremities  . Chronic kidney disease     Creatinine 2.1, October 13, 2010--> 0.74 10/2012  . Hyperlipidemia     Mixed  . GI bleed   . Ejection fraction     EF 50-55%, echo, May, 2013, severe inferior hypokinesis  . Bilateral pulmonary embolism ([place])      a. 10/2012 - V:Q scan high prob for Bilat PE in setting of dyspnea/hemoptysis-->anticoagulation started.  . Right leg DVT ([place])     a. 10/2012 - subocclusive DVT R popliteal vein.  Marland Kitchen COPD (chronic obstructive pulmonary disease) ([place])   . Chronic pain     bilat hips, right shoulder, RLE  . PE (pulmonary embolism)   . Chronic anticoagulation   . Wheelchair bound     Past Surgical History  Procedure Laterality Date  . Knee arthroplasty      Right  . Total knee arthroplasty      Left  . Total hip arthroplasty      Right-complicated hx  . Colon resection    . Back surgery      [place]  . Prostate surgery    . Colonoscopy N/A 03/13/2013    Procedure: COLONOSCOPY;  Surgeon: Rogene Houston, MD;  [facility];  Service: Endoscopy;  Laterality: N/A;  730  . Cataract extraction    . Colonoscopy N/A 12/22/2014    Procedure: COLONOSCOPY;  Surgeon: Rogene Houston, MD;  [facility];  Service: Endoscopy;  Laterality: N/A;  . Givens capsule study N/A 12/24/2014    Procedure: GIVENS CAPSULE STUDY;  Surgeon: Rogene Houston, MD;  Location[facility];  Service: Endoscopy;  Laterality: N/A;  . Coronary angioplasty with stent placement  x 4   Family History  Problem Relation Age of Onset  . Diabetes Daughter   . Hypertension Daughter   . Obesity Daughter    Social History  Substance Use Topics  . Smoking status: Former Smoker -- 1.00 packs/day for 30 years    Types: Cigarettes    Quit date: 08/16/2006  . Smokeless tobacco: Never Used     Comment: Patient smoked a pack a day for about 30 years  . Alcohol Use: No    Current outpatient prescriptions:  .  allopurinol (ZYLOPRIM) 100 MG tablet, Take 100 mg by mouth every evening. , Disp: , Rfl:  .  ascorbic acid (VITAMIN C) 500 MG tablet, Take 500 mg by mouth daily. , Disp: , Rfl:  .  atorvastatin (LIPITOR) 80 MG tablet, Take 80 mg by mouth every evening. , Disp: , Rfl:  .  calcium-vitamin D (OSCAL WITH  D) 500-200 MG-UNIT per tablet, Take 1 tablet by mouth daily. , Disp: , Rfl:  .  cilostazol (PLETAL) 100 MG tablet, Take 1 tablet by mouth daily., Disp: , Rfl:  .  COLCRYS 0.6 MG tablet, Take 1 tablet by mouth daily as needed (gout pain). , Disp: , Rfl:  .  diclofenac sodium (VOLTAREN) 1 % GEL, Apply 1 application topically daily. Applied to feet, Disp: , Rfl:  .  diltiazem (CARDIZEM CD) 120 MG 24 hr capsule, Take 1 capsule (120 mg total) by mouth daily. As directed based off blood pressure readings, Disp: 60 capsule, Rfl: 0 .  doxycycline (VIBRA-TABS) 100 MG tablet, Take 1 tablet (100 mg total) by mouth 2 (two) times daily., Disp: 14 tablet, Rfl: 0 .  fenofibrate (TRICOR) 145 MG tablet, Take 1 tablet (145 mg total) by mouth daily. (Patient taking differently: Take 145 mg by mouth every evening. ), Disp: 30 tablet, Rfl: 6 .  fentaNYL (DURAGESIC - DOSED MCG/HR) 75 MCG/HR, Place 75 mcg onto the skin every 3 (three) days. Change patch every 3 days, Disp: , Rfl:  .  ferrous sulfate 325 (65 FE) MG tablet, Take 325 mg by mouth daily with breakfast., Disp: , Rfl:  .  finasteride (PROSCAR) 5 MG tablet, Take 5 mg by mouth daily.  , Disp: , Rfl:  .  furosemide (LASIX) 40 MG tablet, Take 40 mg by mouth every evening. *May take 80mg  as needed for swelling, Disp: , Rfl:  .  HYDROcodone-acetaminophen (NORCO/VICODIN) 5-325 MG per tablet, Take 1-2 tablets by mouth every 6 (six) hours as needed for moderate pain or severe pain. , Disp: , Rfl: 0 .  lidocaine (LIDODERM) 5 %, Place 1 patch onto the skin daily as needed (for pain). Every 12 hours as needed, Disp: , Rfl:  .  magnesium oxide (MAG-OX) 400 (241.3 MG) MG tablet, Take 1 tablet (400 mg total) by mouth 2 (two) times daily., Disp: 60 tablet, Rfl: 2 .  metoCLOPramide (REGLAN) 10 MG tablet, Take 1 tablet (10 mg total) by mouth daily., Disp: 30 tablet, Rfl: 5 .  metoprolol (LOPRESSOR) 100 MG tablet, Take 1 tablet (100 mg total) by mouth 2 (two) times daily., Disp: 180  tablet, Rfl: 3 .  metroNIDAZOLE (FLAGYL) 500 MG tablet, Take 1 tablet (500 mg total) by mouth 3 (three) times daily. (Patient not taking: Reported on 01/16/2016), Disp: 30 tablet, Rfl: 0 .  Multiple Vitamin (MULTIVITAMIN) tablet, Take 1 tablet by mouth daily.  , Disp: , Rfl:  .  nitroGLYCERIN (NITROSTAT) 0.4 MG SL tablet, Place 1 tablet (0.4 mg  total) under the tongue every 5 (five) minutes x 3 doses as needed., Disp: 25 tablet, Rfl: 3 .  Omega-3 Fatty Acids (FISH OIL) 1000 MG CAPS, Take 2 capsules by mouth daily. , Disp: , Rfl:  .  pantoprazole (PROTONIX) 40 MG tablet, Take 40 mg by mouth daily.  , Disp: , Rfl:  .  potassium chloride SA (K-DUR,KLOR-CON) 20 MEQ tablet, Take 2 tablets (40 mEq total) by mouth daily. (Patient taking differently: Take 20 mEq by mouth daily. *Give additional tablet if Lasix is increased due to swelling), Disp: 30 tablet, Rfl: 0 .  PROAIR HFA 108 (90 Base) MCG/ACT inhaler, Inhale 2 puffs into the lungs 2 (two) times daily as needed for wheezing or shortness of breath. 2 puffs twice daily, Disp: , Rfl:  .  psyllium (METAMUCIL SMOOTH TEXTURE) 28 % packet, Take 1 packet by mouth at bedtime. (Patient taking differently: Take 1 packet by mouth at bedtime as needed (bowels). ), Disp: , Rfl:  .  rivaroxaban (XARELTO) 20 MG TABS tablet, Take 1 tablet (20 mg total) by mouth daily., Disp: 30 tablet, Rfl: 10 .  rivastigmine (EXELON) 9.5 mg/24hr, Place 1 patch (9.5 mg total) onto the skin daily., Disp: 30 patch, Rfl: 3 .  tamsulosin (FLOMAX) 0.4 MG CAPS capsule, Take 0.4 mg by mouth daily after supper., Disp: , Rfl:  .  [DISCONTINUED] solifenacin (VESICARE) 10 MG tablet, Take 10 mg by mouth daily. , Disp: , Rfl:   BP 147/103 mmHg  Ht 6\' 2"  (1.88 m)  Wt 296 lb (134.265 kg)  BMI 37.99 kg/m2  Physical Exam  Constitutional: He is oriented to person, place, and time. He appears well-developed and well-nourished. No distress.  Cardiovascular: Normal rate and intact distal pulses.    Neurological: He is alert and oriented to person, place, and time.  Skin: Skin is warm and dry. No rash noted. He is not diaphoretic. No erythema. No pallor.  Psychiatric: His speech is normal. Thought content normal. He is slowed. He exhibits a depressed mood.    Ortho Exam  He has basically in the wheelchair confined does not weight-bear stand except for transfers  His right foot is in a flat foot deformity chronic. He has discoloration of the skin. No atrophy is noted in the foot no instability is seen at the ankle joint. The motion of the ankle seems limited by pain and chronic arthritis. He seems to have normal temperature discolored skin in sensation appears to be altered  Left foot there is no tenderness in the small toe great toe there is an abrasion over the great toe on the left and one on the right foot   ASSESSMENT: My personal interpretation of the images:  Image #1 series right ankle chronic deformity of the right ankle with new fibular fracture  Image #2 series right foot nondisplaced right fifth metatarsal fracture    PLAN Cam Walker Okay to weight-bear as tolerated although patient doesn't weight-bear  X-rays at the hospital prior to next visit in 6 weeks  Arther Abbott, MD 01/26/2016 11:22 AM

## 2016-01-27 DIAGNOSIS — S82891A Other fracture of right lower leg, initial encounter for closed fracture: Secondary | ICD-10-CM | POA: Diagnosis not present

## 2016-01-27 DIAGNOSIS — Z299 Encounter for prophylactic measures, unspecified: Secondary | ICD-10-CM | POA: Diagnosis not present

## 2016-01-27 DIAGNOSIS — J449 Chronic obstructive pulmonary disease, unspecified: Secondary | ICD-10-CM | POA: Diagnosis not present

## 2016-01-27 DIAGNOSIS — R0602 Shortness of breath: Secondary | ICD-10-CM | POA: Diagnosis not present

## 2016-01-29 NOTE — Telephone Encounter (Signed)
I have already talked with patient.

## 2016-02-10 ENCOUNTER — Other Ambulatory Visit: Payer: Self-pay | Admitting: Neurology

## 2016-02-10 NOTE — Telephone Encounter (Signed)
[employer] refill requested. Per last office note- patient to remain on medication. Refill approved and sent to patient's pharmacy.   

## 2016-02-16 ENCOUNTER — Other Ambulatory Visit: Payer: Self-pay | Admitting: Neurology

## 2016-02-16 MED ORDER — RIVASTIGMINE 9.5 MG/24HR TD PT24: MEDICATED_PATCH | TRANSDERMAL | Status: DC

## 2016-02-16 NOTE — Telephone Encounter (Signed)
[employer] refill requested. Per last office note- patient to remain on medication. Refill approved and sent to patient's pharmacy.   

## 2016-02-23 DIAGNOSIS — I10 Essential (primary) hypertension: Secondary | ICD-10-CM | POA: Diagnosis not present

## 2016-02-23 DIAGNOSIS — M1990 Unspecified osteoarthritis, unspecified site: Secondary | ICD-10-CM | POA: Diagnosis not present

## 2016-02-23 DIAGNOSIS — J069 Acute upper respiratory infection, unspecified: Secondary | ICD-10-CM | POA: Diagnosis not present

## 2016-02-23 DIAGNOSIS — Z299 Encounter for prophylactic measures, unspecified: Secondary | ICD-10-CM | POA: Diagnosis not present

## 2016-02-23 DIAGNOSIS — J449 Chronic obstructive pulmonary disease, unspecified: Secondary | ICD-10-CM | POA: Diagnosis not present

## 2016-02-28 ENCOUNTER — Other Ambulatory Visit: Payer: Self-pay | Admitting: Cardiology

## 2016-03-01 ENCOUNTER — Other Ambulatory Visit: Payer: Self-pay | Admitting: *Deleted

## 2016-03-01 MED ORDER — DILTIAZEM HCL ER COATED BEADS 120 MG PO CP24: 120.0000 mg | ORAL_CAPSULE | Freq: Every day | ORAL | Status: DC

## 2016-03-03 DIAGNOSIS — M19012 Primary osteoarthritis, left shoulder: Secondary | ICD-10-CM | POA: Diagnosis not present

## 2016-03-03 DIAGNOSIS — M12811 Other specific arthropathies, not elsewhere classified, right shoulder: Secondary | ICD-10-CM | POA: Diagnosis not present

## 2016-03-04 DIAGNOSIS — I2510 Atherosclerotic heart disease of native coronary artery without angina pectoris: Secondary | ICD-10-CM | POA: Diagnosis not present

## 2016-03-04 DIAGNOSIS — J449 Chronic obstructive pulmonary disease, unspecified: Secondary | ICD-10-CM | POA: Diagnosis not present

## 2016-03-04 DIAGNOSIS — M159 Polyosteoarthritis, unspecified: Secondary | ICD-10-CM | POA: Diagnosis not present

## 2016-03-04 DIAGNOSIS — I10 Essential (primary) hypertension: Secondary | ICD-10-CM | POA: Diagnosis not present

## 2016-03-08 ENCOUNTER — Ambulatory Visit: Payer: Medicare Other | Admitting: Orthopedic Surgery

## 2016-03-11 DIAGNOSIS — H353131 Nonexudative age-related macular degeneration, bilateral, early dry stage: Secondary | ICD-10-CM | POA: Diagnosis not present

## 2016-03-11 DIAGNOSIS — Z961 Presence of intraocular lens: Secondary | ICD-10-CM | POA: Diagnosis not present

## 2016-03-11 DIAGNOSIS — H04123 Dry eye syndrome of bilateral lacrimal glands: Secondary | ICD-10-CM | POA: Diagnosis not present

## 2016-03-15 ENCOUNTER — Ambulatory Visit: Payer: Medicare Other | Admitting: Orthopedic Surgery

## 2016-03-16 ENCOUNTER — Telehealth: Payer: Self-pay | Admitting: Orthopedic Surgery

## 2016-03-16 NOTE — Telephone Encounter (Signed)
Patient/wife, designated party contact, is asking if Dr Aline Brochure will provide the results of next Xray by phone, which is ordered at [facility], rather than coming in for the upcoming scheduled appointment 03/22/16 for results, due to difficulty transporting patient.  Please advise.  PH#'s [phone] (Home) or [phone] [facility])

## 2016-03-17 NOTE — Telephone Encounter (Signed)
OK  WHEN IS THE XRAY BEING DONE

## 2016-03-19 DIAGNOSIS — J329 Chronic sinusitis, unspecified: Secondary | ICD-10-CM | POA: Diagnosis not present

## 2016-03-19 DIAGNOSIS — J42 Unspecified chronic bronchitis: Secondary | ICD-10-CM | POA: Diagnosis not present

## 2016-03-19 DIAGNOSIS — J449 Chronic obstructive pulmonary disease, unspecified: Secondary | ICD-10-CM | POA: Diagnosis not present

## 2016-03-19 DIAGNOSIS — I4891 Unspecified atrial fibrillation: Secondary | ICD-10-CM | POA: Diagnosis not present

## 2016-03-22 ENCOUNTER — Ambulatory Visit: Payer: Medicare Other | Admitting: Orthopedic Surgery

## 2016-03-22 NOTE — Telephone Encounter (Signed)
Called back to patient/wife - left voice message asking if going to have Xray today, as appointment is scheduled 1:30pm, and Xray results will be needed for Dr Aline Brochure to determine if appointment is necessary.

## 2016-03-22 NOTE — Telephone Encounter (Signed)
Patient aware to get Xray done at [facility] (even if our Xray is up and functioning) per Dr Aline Brochure, and to call our office when it's been done.

## 2016-03-29 ENCOUNTER — Ambulatory Visit (HOSPITAL_COMMUNITY)
Admission: RE | Admit: 2016-03-29 | Discharge: 2016-03-29 | Disposition: A | Discharge status: Home / Self Care | Payer: Medicare Other | Source: Ambulatory Visit | Attending: Orthopedic Surgery | Admitting: Orthopedic Surgery

## 2016-03-29 DIAGNOSIS — X58XXXD Exposure to other specified factors, subsequent encounter: Secondary | ICD-10-CM | POA: Insufficient documentation

## 2016-03-29 DIAGNOSIS — S82401D Unspecified fracture of shaft of right fibula, subsequent encounter for closed fracture with routine healing: Secondary | ICD-10-CM | POA: Diagnosis not present

## 2016-03-29 DIAGNOSIS — M79671 Pain in right foot: Secondary | ICD-10-CM | POA: Diagnosis not present

## 2016-03-29 DIAGNOSIS — S82401A Unspecified fracture of shaft of right fibula, initial encounter for closed fracture: Secondary | ICD-10-CM

## 2016-03-29 DIAGNOSIS — M8580 Other specified disorders of bone density and structure, unspecified site: Secondary | ICD-10-CM | POA: Insufficient documentation

## 2016-03-29 DIAGNOSIS — M25571 Pain in right ankle and joints of right foot: Secondary | ICD-10-CM | POA: Diagnosis not present

## 2016-03-29 DIAGNOSIS — S92301A Fracture of unspecified metatarsal bone(s), right foot, initial encounter for closed fracture: Secondary | ICD-10-CM

## 2016-03-29 DIAGNOSIS — S92301D Fracture of unspecified metatarsal bone(s), right foot, subsequent encounter for fracture with routine healing: Secondary | ICD-10-CM | POA: Insufficient documentation

## 2016-03-29 DIAGNOSIS — M2141 Flat foot [pes planus] (acquired), right foot: Secondary | ICD-10-CM | POA: Diagnosis not present

## 2016-03-29 DIAGNOSIS — I70208 Unspecified atherosclerosis of native arteries of extremities, other extremity: Secondary | ICD-10-CM | POA: Insufficient documentation

## 2016-03-30 ENCOUNTER — Telehealth: Payer: Self-pay | Admitting: Orthopedic Surgery

## 2016-03-30 DIAGNOSIS — Z1389 Encounter for screening for other disorder: Secondary | ICD-10-CM | POA: Diagnosis not present

## 2016-03-30 DIAGNOSIS — Z7189 Other specified counseling: Secondary | ICD-10-CM | POA: Diagnosis not present

## 2016-03-30 DIAGNOSIS — Z1211 Encounter for screening for malignant neoplasm of colon: Secondary | ICD-10-CM | POA: Diagnosis not present

## 2016-03-30 DIAGNOSIS — M545 Low back pain: Secondary | ICD-10-CM | POA: Diagnosis not present

## 2016-03-30 DIAGNOSIS — D649 Anemia, unspecified: Secondary | ICD-10-CM | POA: Diagnosis not present

## 2016-03-30 DIAGNOSIS — Z0000 Encounter for general adult medical examination without abnormal findings: Secondary | ICD-10-CM | POA: Diagnosis not present

## 2016-03-30 DIAGNOSIS — E7800 Pure hypercholesterolemia, unspecified: Secondary | ICD-10-CM | POA: Diagnosis not present

## 2016-03-30 DIAGNOSIS — R5383 Other fatigue: Secondary | ICD-10-CM | POA: Diagnosis not present

## 2016-03-30 DIAGNOSIS — Z299 Encounter for prophylactic measures, unspecified: Secondary | ICD-10-CM | POA: Diagnosis not present

## 2016-03-30 NOTE — Telephone Encounter (Signed)
Please call with Xray results / Is appointment still needed?

## 2016-03-31 NOTE — Telephone Encounter (Signed)
The fractures are healing and no further visit is needed unless patient experiences continued difficulty

## 2016-04-01 NOTE — Telephone Encounter (Signed)
Patients wife aware

## 2016-04-01 NOTE — Telephone Encounter (Signed)
2 WEEKS

## 2016-04-01 NOTE — Telephone Encounter (Signed)
How long will he need to continue wearing the boot?

## 2016-04-02 DIAGNOSIS — Z125 Encounter for screening for malignant neoplasm of prostate: Secondary | ICD-10-CM | POA: Diagnosis not present

## 2016-04-02 DIAGNOSIS — E7800 Pure hypercholesterolemia, unspecified: Secondary | ICD-10-CM | POA: Diagnosis not present

## 2016-04-02 DIAGNOSIS — Z79899 Other long term (current) drug therapy: Secondary | ICD-10-CM | POA: Diagnosis not present

## 2016-04-02 DIAGNOSIS — R5383 Other fatigue: Secondary | ICD-10-CM | POA: Diagnosis not present

## 2016-04-08 ENCOUNTER — Encounter: Payer: Self-pay | Admitting: *Deleted

## 2016-04-09 ENCOUNTER — Ambulatory Visit (INDEPENDENT_AMBULATORY_CARE_PROVIDER_SITE_OTHER): Payer: Medicare Other | Admitting: Cardiology

## 2016-04-09 ENCOUNTER — Encounter: Payer: Self-pay | Admitting: Cardiology

## 2016-04-09 VITALS — BP 126/80 | HR 102 | Ht 74.0 in | Wt 290.0 lb

## 2016-04-09 DIAGNOSIS — I10 Essential (primary) hypertension: Secondary | ICD-10-CM

## 2016-04-09 DIAGNOSIS — Z86718 Personal history of other venous thrombosis and embolism: Secondary | ICD-10-CM | POA: Diagnosis not present

## 2016-04-09 DIAGNOSIS — I481 Persistent atrial fibrillation: Secondary | ICD-10-CM

## 2016-04-09 DIAGNOSIS — I2510 Atherosclerotic heart disease of native coronary artery without angina pectoris: Secondary | ICD-10-CM | POA: Diagnosis not present

## 2016-04-09 DIAGNOSIS — I4819 Other persistent atrial fibrillation: Secondary | ICD-10-CM

## 2016-04-09 NOTE — Patient Instructions (Signed)

## 2016-04-09 NOTE — Progress Notes (Signed)
Cardiology Office Note  Date: 04/09/2016   ID: Bryce Berry, DOB 02-20-1941, MRN [id number]  PCP: Glenda Chroman, MD  Primary Cardiologist: Rozann Lesches, MD   Chief Complaint  Patient presents with  . Atrial Fibrillation    History of Present Illness: Bryce Berry is a 75 y.o. male former patient Dr. Ron Parker, last seen in the office back in May 2016. He presents to establish follow-up with me, I reviewed his records and updated chart. He is here today with his wife and an Environmental consultant. Today we had a fairly long discussion about his heart rate and blood pressure control. He has history of apparent paroxysmal to persistent atrial fibrillation, noted to be in atrial fibrillation with Dr. Ron Parker back in May 2016. He was managed with strategy of heart rate control and continued anticoagulation at that point. He is on Xarelto for stroke prohpylaxis. Patient's wife tells me that Dr. Ron Parker cut Cardizem CD from 240 mg daily to 120 mg daily related to low blood pressures. He also at one point had been on Lanoxin. Apparently there are fluctuations in both heart rate and blood pressure, heart rate can be up and 100 110 range, but systolics can also be in the 90s. It is not entirely clear how symptomatic he is with these events however.  Today we discussed a number of options. One option would be to not change anything at this time, his heart rate was in the 90s on my examination and he was asymptomatic. Other options would be to add low-dose Lanoxin to his current regimen if further heart rate control is needed with the expectation that this would not significantly affect his blood pressure.  Another option is to increase Cardizem CD 180 mg daily. After reviewing all of these, final decision was to observe with no change today but potentially add Lovenox next if his heart rate remains more persistently elevated over 100.  He does not report any angina at this time. No nitroglycerin use. He is functionally  limited and is in a wheelchair.   Past Medical History:  Diagnosis Date  . Atrial fibrillation ([place])   . Atrial flutter ([place])    Possible history in the past - details unclear  . Bilateral pulmonary embolism ([place])    a. 10/2012 - V:Q scan high prob for Bilat PE in setting of dyspnea/hemoptysis-->anticoagulation started.  Marland Kitchen BPH (benign prostatic hyperplasia)   . CAD (coronary artery disease)    Stent PCI in [place] and also [place] in the past - details not clear  . Chronic anticoagulation   . Chronic pain   . 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 COPD (chronic obstructive pulmonary disease) ([place])   . Depression   . Dyslipidemia   . Essential hypertension   . GERD (gastroesophageal reflux disease)   . GI bleed   . Gout   . Hyperlipidemia    Mixed  . Irritable bowel   . Osteoarthritis   . Osteoporosis   . Peripheral arterial disease ([place])    Dopplers Dr.Vyas office October, 2010, apparent occlusion anterior tibial arteries bilaterally., findings suggest hemodynamically significant stenosis of the proximal right provisional femoral artery and the mid left superficial artery.  Ankle-brachial indices indicate moderate stenosis within both lower extremities  . Posttraumatic stress disorder   . Recurrent UTI (urinary tract infection)   . Right leg DVT ([place])    a. 10/2012 - subocclusive DVT R popliteal vein.  . Wheelchair bound  Past Surgical History:  Procedure Laterality Date  . [place]  . CATARACT EXTRACTION    . COLON RESECTION    . COLONOSCOPY N/A 03/13/2013   Procedure: COLONOSCOPY;  Surgeon: Rogene Houston, MD;  [facility];  Service: Endoscopy;  Laterality: N/A;  730  . COLONOSCOPY N/A 12/22/2014   Procedure: COLONOSCOPY;  Surgeon: Rogene Houston, MD;  [facility];  Service: Endoscopy;  Laterality: N/A;  . CORONARY ANGIOPLASTY WITH STENT PLACEMENT     x 4  . GIVENS CAPSULE STUDY N/A 12/24/2014    Procedure: GIVENS CAPSULE STUDY;  Surgeon: Rogene Houston, MD;  [facility];  Service: Endoscopy;  Laterality: N/A;  . KNEE ARTHROPLASTY     Right  . PROSTATE SURGERY    . TOTAL HIP ARTHROPLASTY     Right-complicated hx  . TOTAL KNEE ARTHROPLASTY     Left    Current Outpatient Prescriptions  Medication Sig Dispense Refill  . allopurinol (ZYLOPRIM) 100 MG tablet Take 100 mg by mouth every evening.     Marland Kitchen ascorbic acid (VITAMIN C) 500 MG tablet Take 500 mg by mouth daily.     Marland Kitchen atorvastatin (LIPITOR) 80 MG tablet Take 80 mg by mouth every evening.     . calcium-vitamin D (OSCAL WITH D) 500-200 MG-UNIT per tablet Take 1 tablet by mouth daily.     . cilostazol (PLETAL) 100 MG tablet Take 1 tablet by mouth daily.    Marland Kitchen COLCRYS 0.6 MG tablet Take 1 tablet by mouth daily as needed (gout pain).     Marland Kitchen diclofenac sodium (VOLTAREN) 1 % GEL Apply 1 application topically daily. Applied to feet    . diltiazem (CARDIZEM CD) 120 MG 24 hr capsule Take 1 capsule (120 mg total) by mouth daily. As directed based off blood pressure readings 15 capsule 0  . fenofibrate (TRICOR) 145 MG tablet Take 1 tablet (145 mg total) by mouth daily. (Patient taking differently: Take 145 mg by mouth every evening. ) 30 tablet 6  . fentaNYL (DURAGESIC - DOSED MCG/HR) 75 MCG/HR Place 75 mcg onto the skin every 3 (three) days. Change patch every 3 days    . ferrous sulfate 325 (65 FE) MG tablet Take 325 mg by mouth daily with breakfast.    . finasteride (PROSCAR) 5 MG tablet Take 5 mg by mouth daily.      . furosemide (LASIX) 40 MG tablet Take 40 mg by mouth every evening. *May take 80mg  as needed for swelling    . HYDROcodone-acetaminophen (NORCO) 7.5-325 MG tablet Take 1 tablet by mouth every 4 (four) hours as needed for moderate pain. 42 tablet 0  . lidocaine (LIDODERM) 5 % Place 1 patch onto the skin daily as needed (for pain). Every 12 hours as needed    . magnesium oxide (MAG-OX) 400 (241.3 MG) MG tablet Take 1  tablet (400 mg total) by mouth 2 (two) times daily. 60 tablet 2  . metoCLOPramide (REGLAN) 10 MG tablet Take 1 tablet (10 mg total) by mouth daily. 30 tablet 5  . metoprolol (LOPRESSOR) 100 MG tablet Take 1 tablet (100 mg total) by mouth 2 (two) times daily. 180 tablet 3  . Multiple Vitamin (MULTIVITAMIN) tablet Take 1 tablet by mouth daily.      . nitroGLYCERIN (NITROSTAT) 0.4 MG SL tablet Place 1 tablet (0.4 mg total) under the tongue every 5 (five) minutes x 3 doses as needed. 25  tablet 3  . Omega-3 Fatty Acids (FISH OIL) 1000 MG CAPS Take 2 capsules by mouth daily.     . pantoprazole (PROTONIX) 40 MG tablet Take 40 mg by mouth daily.      . potassium chloride SA (K-DUR,KLOR-CON) 20 MEQ tablet Take 2 tablets (40 mEq total) by mouth daily. (Patient taking differently: Take 20 mEq by mouth daily. *Give additional tablet if Lasix is increased due to swelling) 30 tablet 0  . PROAIR HFA 108 (90 Base) MCG/ACT inhaler Inhale 2 puffs into the lungs 2 (two) times daily as needed for wheezing or shortness of breath. 2 puffs twice daily    . psyllium (METAMUCIL SMOOTH TEXTURE) 28 % packet Take 1 packet by mouth at bedtime. (Patient taking differently: Take 1 packet by mouth at bedtime as needed (bowels). )    . rivaroxaban (XARELTO) 20 MG TABS tablet Take 1 tablet (20 mg total) by mouth daily. 30 tablet 10  . rivastigmine (EXELON) 9.5 mg/24hr PLACE ONE PATCH ON SKIN ONCE DAILY. 30 patch 2  . tamsulosin (FLOMAX) 0.4 MG CAPS capsule Take 0.4 mg by mouth daily after supper.     No current facility-administered medications for this visit.    Allergies:  Omeprazole; Other; Penicillins; Sulfonamide derivatives; and Iodine   Social History: The patient  reports that he quit smoking about 9 years ago. His smoking use included Cigarettes. He started smoking about 59 years ago. He has a 30.00 pack-year smoking history. He has never used smokeless tobacco. He reports that he does not drink alcohol or use drugs.    ROS:  Please see the history of present illness. Otherwise, complete review of systems is positive for chronic pain.  All other systems are reviewed and negative.   Physical Exam: VS:  BP 126/80 Comment: right lower forear/sitting/large cuff  Pulse (!) 102   Ht 6\' 2"  (1.88 m)   Wt 290 lb (131.5 kg) Comment: reported by patient/unable to stand to weigh in office today  SpO2 100% Comment: on room air  BMI 37.23 kg/m , BMI Body mass index is 37.23 kg/m.  Wt Readings from Last 3 Encounters:  04/09/16 290 lb (131.5 kg)  01/26/16 296 lb (134.3 kg)  01/16/16 296 lb (134.3 kg)    General: Obese male seated in wheelchair. HEENT: Conjunctiva and lids normal, oropharynx clear. Neck: Supple, increased girth without obvious elevated JVP, no thyromegaly. Lungs: Clear to auscultation, nonlabored breathing at rest. Cardiac: Irregularly irregular with heart rate 96, no S3 or significant systolic murmur, no pericardial rub. Abdomen: Obese, bowel sounds present, no guarding or rebound. Extremities: Chronic appearing edema, distal pulses 1-2+. Skin: Warm and dry. Musculoskeletal: No kyphosis. Neuropsychiatric: Alert and oriented x3, affect grossly appropriate.  ECG: I personally reviewed the tracing from 10/24/2015 which showed atrial fibrillation with low voltage, heart rate 93.  Recent Labwork: 07/29/2015: TSH 0.817 10/24/2015: ALT 19; AST 36; B Natriuretic Peptide 25.0; Magnesium 1.4 01/16/2016: BUN 16; Creatinine, Ser 0.85; Hemoglobin 13.1; Platelets 151; Potassium 3.5; Sodium 140   Other Studies Reviewed Today:  Echocardiogram 10/17/2012: Study Conclusions  - Left ventricle: The cavity size was normal. There was mild concentric hypertrophy. Systolic function was normal. The estimated ejection fraction was in the range of 55% to 60%. - Mitral valve: Calcified annulus. - Left atrium: The atrium was mildly to moderately dilated. - Right atrium: The atrium was mildly dilated. -  Pulmonary arteries: PA peak pressure: 46mm Hg (S).  Assessment and Plan:  1. Chronic atrial fibrillation.  Continue strategy of heart rate control and anticoagulation. He is on Xarelto, Cardizem CD, and Lopressor. As noted above, if heart rate remains more persistently over 100, could consider adding Lanoxin 0.125 mg daily and hopefully not further reduce blood pressure since mild hypotension does seem to occur at times.  2. CAD status post previous stent interventions years ago, I do not have complete details. He is not reporting any angina at this time we will continue with conservative management. Last echocardiogram demonstrated LVEF 55%.  3. Essential hypertension with intermittent mild hypotension. This limits titration of medications for heart rate control to some degree.  4. Prior history of recurrent DVT and bilateral pulmonary emboli. He is on long-term anticoagulation for this diagnosis as well.   Current medicines were reviewed with the patient today.  Disposition: Follow-up with me in 6 months.  Signed, Satira Sark, MD, [facility] 04/09/2016 3:52 PM    [facility] [place], [place], [place] [address] Phone: [phone]; Fax: [phone]

## 2016-04-14 ENCOUNTER — Telehealth: Payer: Self-pay | Admitting: Cardiology

## 2016-04-14 DIAGNOSIS — Z5181 Encounter for therapeutic drug level monitoring: Secondary | ICD-10-CM

## 2016-04-14 DIAGNOSIS — Z79899 Other long term (current) drug therapy: Principal | ICD-10-CM

## 2016-04-14 NOTE — Telephone Encounter (Signed)
Mrs. Stockert called stating that she was told to contact office in a few days about new medication that Mr. Hirota was put on.

## 2016-04-14 NOTE — Telephone Encounter (Signed)
Start Lanoxin 0.125 mg daily. Check digoxin level in one month.

## 2016-04-14 NOTE — Telephone Encounter (Signed)
Pt says HR still has been running 112-120 - wants to try dig 0.125 mg daily as discussed at [place]. Will forward to Dr. Domenic Polite to confirm

## 2016-04-15 ENCOUNTER — Institutional Professional Consult (permissible substitution): Payer: Medicare Other | Admitting: Pulmonary Disease

## 2016-04-15 MED ORDER — DILTIAZEM HCL ER COATED BEADS 120 MG PO CP24: 120.0000 mg | ORAL_CAPSULE | Freq: Every day | ORAL | 6 refills | Status: DC

## 2016-04-15 MED ORDER — DIGOXIN 125 MCG PO TABS: 0.1250 mg | ORAL_TABLET | Freq: Every day | ORAL | 3 refills | Status: DC

## 2016-04-15 NOTE — Telephone Encounter (Signed)
Returned call

## 2016-04-15 NOTE — Telephone Encounter (Signed)
Pt aware - dig and RF of diltiazem sent to [facility] as requested and lab orders mailed to pt

## 2016-04-27 DIAGNOSIS — I2510 Atherosclerotic heart disease of native coronary artery without angina pectoris: Secondary | ICD-10-CM | POA: Diagnosis not present

## 2016-04-27 DIAGNOSIS — J449 Chronic obstructive pulmonary disease, unspecified: Secondary | ICD-10-CM | POA: Diagnosis not present

## 2016-04-27 DIAGNOSIS — I10 Essential (primary) hypertension: Secondary | ICD-10-CM | POA: Diagnosis not present

## 2016-04-27 DIAGNOSIS — M159 Polyosteoarthritis, unspecified: Secondary | ICD-10-CM | POA: Diagnosis not present

## 2016-05-03 ENCOUNTER — Ambulatory Visit: Payer: Medicare Other | Admitting: Neurology

## 2016-05-06 ENCOUNTER — Ambulatory Visit (INDEPENDENT_AMBULATORY_CARE_PROVIDER_SITE_OTHER): Payer: Medicare Other | Admitting: Pulmonary Disease

## 2016-05-06 ENCOUNTER — Encounter: Payer: Self-pay | Admitting: Pulmonary Disease

## 2016-05-06 ENCOUNTER — Ambulatory Visit (INDEPENDENT_AMBULATORY_CARE_PROVIDER_SITE_OTHER)
Admission: RE | Admit: 2016-05-06 | Discharge: 2016-05-06 | Disposition: A | Discharge status: Home / Self Care | Payer: Medicare Other | Source: Ambulatory Visit | Attending: Pulmonary Disease | Admitting: Pulmonary Disease

## 2016-05-06 VITALS — BP 132/76 | HR 79

## 2016-05-06 DIAGNOSIS — J449 Chronic obstructive pulmonary disease, unspecified: Secondary | ICD-10-CM

## 2016-05-06 DIAGNOSIS — R05 Cough: Secondary | ICD-10-CM | POA: Diagnosis not present

## 2016-05-06 DIAGNOSIS — R0600 Dyspnea, unspecified: Secondary | ICD-10-CM

## 2016-05-06 NOTE — Patient Instructions (Signed)
We will get a chest x-ray today. You will be scheduled for pulmonary function tests and a home sleep study. Please continue using the Anoro as prescribed. He can use the albuterol inhaler as needed or the albuterol, Duo nebs as needed for wheezing. Flu shot  Return in 2 months.

## 2016-05-06 NOTE — Progress Notes (Addendum)
Bryce TORBECK    [id number]    Sep 21, 1940  Primary Care Physician:Dhruv Barbette Reichmann, MD  Referring Physician: Glenda Chroman, MD [place], [place] [address]  Chief complaint:  Consult for evaluation of COPD, bronchitis  HPI: Bryce Berry is a 75 year old with past history of COPD. He has complains of prolonged cough for the past few months associated with greenish sputum production. He has symptoms of dyspnea on exertion, occasional wheezing. He was evaluated at his primary care doctor Dr. Woody Seller enterococcus of Levaquin. He states that the symptoms are improved today.  He does not have PFTs on record and has never been seen by pulmonologist. He is currently maintained on an Anoro. He has history of A fib, recurrent DVT, PE and is on long-term Xarelto anticoagulation. He is wheelchair bound from peripheral vascular disease, ankle fracture. He has history of snoring, witnessed apneas.   Outpatient Encounter Prescriptions as of 05/06/2016  Medication Sig  . allopurinol (ZYLOPRIM) 100 MG tablet Take 100 mg by mouth every evening.   Jearl Klinefelter ELLIPTA 62.5-25 MCG/INH AEPB Inhale 1 puff into the lungs daily.  Marland Kitchen ascorbic acid (VITAMIN C) 500 MG tablet Take 500 mg by mouth daily.   Marland Kitchen atorvastatin (LIPITOR) 80 MG tablet Take 80 mg by mouth every evening.   . calcium-vitamin D (OSCAL WITH D) 500-200 MG-UNIT per tablet Take 1 tablet by mouth daily.   . cilostazol (PLETAL) 100 MG tablet Take 1 tablet by mouth daily.  Marland Kitchen COLCRYS 0.6 MG tablet Take 1 tablet by mouth daily as needed (gout pain).   Marland Kitchen diclofenac sodium (VOLTAREN) 1 % GEL Apply 1 application topically daily. Applied to feet  . digoxin (LANOXIN) 0.125 MG tablet Take 1 tablet (0.125 mg total) by mouth daily.  Marland Kitchen diltiazem (CARDIZEM CD) 120 MG 24 hr capsule Take 1 capsule (120 mg total) by mouth daily. As directed based off blood pressure readings  . fenofibrate (TRICOR) 145 MG tablet Take 1 tablet (145 mg total) by mouth daily. (Patient  taking differently: Take 145 mg by mouth every evening. )  . fentaNYL (DURAGESIC - DOSED MCG/HR) 75 MCG/HR Place 75 mcg onto the skin every 3 (three) days. Change patch every 3 days  . ferrous sulfate 325 (65 FE) MG tablet Take 325 mg by mouth daily with breakfast.  . finasteride (PROSCAR) 5 MG tablet Take 5 mg by mouth daily.    . furosemide (LASIX) 40 MG tablet Take 40 mg by mouth every evening. *May take 80mg  as needed for swelling  . HYDROcodone-acetaminophen (NORCO) 7.5-325 MG tablet Take 1 tablet by mouth every 4 (four) hours as needed for moderate pain.  Marland Kitchen lidocaine (LIDODERM) 5 % Place 1 patch onto the skin daily as needed (for pain). Every 12 hours as needed  . magnesium oxide (MAG-OX) 400 (241.3 MG) MG tablet Take 1 tablet (400 mg total) by mouth 2 (two) times daily.  . metoCLOPramide (REGLAN) 10 MG tablet Take 1 tablet (10 mg total) by mouth daily.  . metoprolol (LOPRESSOR) 100 MG tablet Take 1 tablet (100 mg total) by mouth 2 (two) times daily.  . Multiple Vitamin (MULTIVITAMIN) tablet Take 1 tablet by mouth daily.    . nitroGLYCERIN (NITROSTAT) 0.4 MG SL tablet Place 1 tablet (0.4 mg total) under the tongue every 5 (five) minutes x 3 doses as needed.  . Omega-3 Fatty Acids (FISH OIL) 1000 MG CAPS Take 2 capsules by mouth daily.   Marland Kitchen  pantoprazole (PROTONIX) 40 MG tablet Take 40 mg by mouth daily.    . potassium chloride SA (K-DUR,KLOR-CON) 20 MEQ tablet Take 2 tablets (40 mEq total) by mouth daily. (Patient taking differently: Take 20 mEq by mouth daily. *Give additional tablet if Lasix is increased due to swelling)  . PROAIR HFA 108 (90 Base) MCG/ACT inhaler Inhale 2 puffs into the lungs 2 (two) times daily as needed for wheezing or shortness of breath. 2 puffs twice daily  . psyllium (METAMUCIL SMOOTH TEXTURE) 28 % packet Take 1 packet by mouth at bedtime. (Patient taking differently: Take 1 packet by mouth at bedtime as needed (bowels). )  . rivaroxaban (XARELTO) 20 MG TABS tablet Take  1 tablet (20 mg total) by mouth daily.  . rivastigmine (EXELON) 9.5 mg/24hr PLACE ONE PATCH ON SKIN ONCE DAILY.  . tamsulosin (FLOMAX) 0.4 MG CAPS capsule Take 0.4 mg by mouth daily after supper.   No facility-administered encounter medications on file as of 05/06/2016.     Allergies as of 05/06/2016 - Review Complete 05/06/2016  Allergen Reaction Noted  . Omeprazole Other (See Comments) 07/29/2015  . Other  07/29/2015  . Penicillins Other (See Comments)   . Sulfonamide derivatives Other (See Comments)   . Iodine Rash     Past Medical History:  Diagnosis Date  . Atrial fibrillation ([place])   . Atrial flutter ([place])    Possible history in the past - details unclear  . Bilateral pulmonary embolism ([place])    a. 10/2012 - V:Q scan high prob for Bilat PE in setting of dyspnea/hemoptysis-->anticoagulation started.  Marland Kitchen BPH (benign prostatic hyperplasia)   . CAD (coronary artery disease)    Stent PCI in [place] and also [place] in the past - details not clear  . Chronic anticoagulation   . Chronic pain   . 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 COPD (chronic obstructive pulmonary disease) ([place])   . Depression   . Dyslipidemia   . Essential hypertension   . GERD (gastroesophageal reflux disease)   . GI bleed   . Gout   . Hyperlipidemia    Mixed  . Irritable bowel   . Osteoarthritis   . Osteoporosis   . Peripheral arterial disease ([place])    Dopplers Dr.Vyas office October, 2010, apparent occlusion anterior tibial arteries bilaterally., findings suggest hemodynamically significant stenosis of the proximal right provisional femoral artery and the mid left superficial artery.  Ankle-brachial indices indicate moderate stenosis within both lower extremities  . Posttraumatic stress disorder   . Recurrent UTI (urinary tract infection)   . Right leg DVT ([place])    a. 10/2012 - subocclusive DVT R popliteal vein.  . Wheelchair bound     Past Surgical History:  Procedure Laterality  Date  . [place]  . CATARACT EXTRACTION    . COLON RESECTION    . COLONOSCOPY N/A 03/13/2013   Procedure: COLONOSCOPY;  Surgeon: Rogene Houston, MD;  [facility];  Service: Endoscopy;  Laterality: N/A;  730  . COLONOSCOPY N/A 12/22/2014   Procedure: COLONOSCOPY;  Surgeon: Rogene Houston, MD;  [facility];  Service: Endoscopy;  Laterality: N/A;  . CORONARY ANGIOPLASTY WITH STENT PLACEMENT     x 4  . GIVENS CAPSULE STUDY N/A 12/24/2014   Procedure: GIVENS CAPSULE STUDY;  Surgeon: Rogene Houston, MD;  [facility];  Service: Endoscopy;  Laterality: N/A;  . KNEE ARTHROPLASTY  Right  . PROSTATE SURGERY    . TOTAL HIP ARTHROPLASTY     Right-complicated hx  . TOTAL KNEE ARTHROPLASTY     Left    Family History  Problem Relation Age of Onset  . Diabetes Daughter   . Hypertension Daughter   . Obesity Daughter   . Cancer Maternal Grandmother     Social History   Social History  . Marital status: Married    Spouse name: N/A  . Number of children: N/A  . Years of education: N/A   Occupational History  . Tour manager Retired    Retired  . Vet     Retired   Social History Main Topics  . Smoking status: Former Smoker    Packs/day: 1.00    Years: 30.00    Types: Cigarettes    Start date: 08/31/1956    Quit date: 08/17/2003  . Smokeless tobacco: Never Used     Comment: Patient smoked a pack a day for about 30 years  . Alcohol use No  . Drug use: No  . Sexual activity: Not on file   Other Topics Concern  . Not on file   Social History Narrative   Lives with wife at home, [place].  Bedbound and mobilized wheelchair.  Two hours 5 days of week of HH aid.   1 child   Retired Geographical information systems officer         Review of systems: Review of Systems  Constitutional: Negative for fever and chills.  HENT: Negative.   Eyes: Negative for blurred vision.  Respiratory: as per HPI  Cardiovascular: Negative for chest pain and  palpitations.  Gastrointestinal: Negative for vomiting, diarrhea, blood per rectum. Genitourinary: Negative for dysuria, urgency, frequency and hematuria.  Musculoskeletal: Negative for myalgias, back pain and joint pain.  Skin: Negative for itching and rash.  Neurological: Negative for dizziness, tremors, focal weakness, seizures and loss of consciousness.  Endo/Heme/Allergies: Negative for environmental allergies.  Psychiatric/Behavioral: Negative for depression, suicidal ideas and hallucinations.  All other systems reviewed and are negative.   Physical Exam: Blood pressure 132/76, pulse 79, SpO2 98 %. Gen:      No acute distress HEENT:  EOMI, sclera anicteric Neck:     No masses; no thyromegaly Lungs:    Clear to auscultation bilaterally; normal respiratory effort CV:         Regular rate and rhythm; no murmurs Abd:      + bowel sounds; soft, non-tender; no palpable masses, no distension Ext:    No edema; adequate peripheral perfusion Skin:      Warm and dry; no rash Neuro: alert and oriented x 3 Psych: normal mood and affect  Data Reviewed: CT scan 01/03/15 > Small LLL PE, b/l lower lobe atelectasis. Images reviewed.   Echo 10/17/12 - Left ventricle: The cavity size was normal. There was mild concentric hypertrophy. Systolic function was normal. The estimated ejection fraction was in the range of 55% to 60%. - Mitral valve: Calcified annulus. - Left atrium: The atrium was mildly to moderately dilated. - Right atrium: The atrium was mildly dilated. - Pulmonary arteries: PA peak pressure: 4mm Hg (S).  Assessment:  #1 Chronic bronchitis, COPD. He has had several episodes of bronchitis, respiratory infection the past treated with antibiotics. He is currently stable on an Anoro. He continued this and use the albuterol inhaler, nebulizers on a when necessary basis. I'll schedule him for Chest x ray and pulmonary function tests for further evaluation of his  COPD.  #2 PE,  DVT He has history of recurrent PE, DVT. He is on long-term anticoagulation with Xarelto.  #3 Suspected OSA. He will need a sleep study. Given his medical needs and immobility a home sleep study will be better.  Plan/Recommendations: - Continue anoro, albuterol, duonebs PRN - Chest x ray, PFTs, home sleep study - Continue xarelto - Flu vaccination  Marshell Garfinkel MD [facility] Pager [phone] 05/06/2016, 11:10 AM  CC: Glenda Chroman, MD

## 2016-05-10 NOTE — Progress Notes (Signed)
LMOMTCB x 1 

## 2016-05-11 ENCOUNTER — Telehealth: Payer: Self-pay | Admitting: Pulmonary Disease

## 2016-05-11 NOTE — Telephone Encounter (Signed)
Notes Recorded by Marshell Garfinkel, MD on 05/10/2016 at 8:11 AM EDT Please let the patient know that his chest x ray does not show any acute abnormality.  Patient's wife notified of results.  Nothing further needed.

## 2016-05-12 ENCOUNTER — Ambulatory Visit (INDEPENDENT_AMBULATORY_CARE_PROVIDER_SITE_OTHER): Payer: Medicare Other | Admitting: Neurology

## 2016-05-12 VITALS — BP 130/80 | HR 99

## 2016-05-12 DIAGNOSIS — I2510 Atherosclerotic heart disease of native coronary artery without angina pectoris: Secondary | ICD-10-CM | POA: Diagnosis not present

## 2016-05-12 DIAGNOSIS — F0390 Unspecified dementia without behavioral disturbance: Secondary | ICD-10-CM

## 2016-05-12 DIAGNOSIS — F03A0 Unspecified dementia, mild, without behavioral disturbance, psychotic disturbance, mood disturbance, and anxiety: Secondary | ICD-10-CM

## 2016-05-12 MED ORDER — RIVASTIGMINE 13.3 MG/24HR TD PT24: 1.0000 | MEDICATED_PATCH | Freq: Every morning | TRANSDERMAL | 11 refills | Status: DC

## 2016-05-12 NOTE — Patient Instructions (Signed)
1. Increase Exelon patch to 13.3mg /24hr, new prescription called in 2. Continue all your other medications 3. Follow-up in 6 months, call for any changes

## 2016-05-12 NOTE — Progress Notes (Signed)
NEUROLOGY FOLLOW UP OFFICE NOTE  Bryce Berry GY:[id number]  HISTORY OF PRESENT ILLNESS: I had the pleasure of seeing Bryce Berry in follow-up in the neurology clinic on 05/12/2016.  The patient was last seen 8 months ago for confusional episodes. He is again accompanied by his wife and caregiver who help supplement the history today. There was concern when he went to [facility] about possible seizures, however on further history, the confusion appears to be more related to memory changes rather than seizures. His head CT did not show any acute changes, there was generalized atrophy and chronic microvascular disease. His MMSE in January 2017 was 20/30. He was started on Exelon patch, and his wife reports that with increase in patch dose, they had noticed an improvement with his memory. He is pretty good most of the time, with less confusion. He reports this is "on and off." He now has a caregiver 6 days a week for several hours.He is reporting headaches upon awakening and will be having a sleep study this month. He denies any dizziness, focal numbness/tingling. He has chronic pain.   HPI 09/01/2015: This is a pleasant 75 yo RH man with multiple medical issues including hypertension, hyperlipidemia, chronic pain, obesity, atrial fibrillation, PE and DVT on chronic anticoagulation, who presented for confusional episodes. Records available from [facility] were reviewed. He was previously admitted for a UTI, then readmitted on 07/12/15 for intermittent confusion, his wife reported at times he is stable and understands everything, at other times he seems to be confused. CBC, CMP, urinalysis were unremarkable. He had a head CT which was reported as unremarkable, unable to get an MRI due to patient size. He then presented to [facility] on 07/29/15 for generalized weakness and confusion. It was felt that UTI, as well as narcotics are playing a role in his weakness. His Fentanyl patch dose was reduced. The patient  reports that sometimes he may feel a little confused, with difficulties remembering dates, then "comes back pretty clear." His wife reports confusion when she is getting him ready for bed, she asks him to move to the right and it takes him a while then moves to the left. She has noticed difficulties with following instructions. No gibberish speech, staring/unresponsiveness, convulsive activity noted. She has noticed more of the symptoms at night, but other nights he is "clear." She is his primary caregiver. He has been wheelchair-bound for the past 9 months for back, shoulder, and hip pain. He currently is having significant right shoulder pain, and some left shoulder pain. They deny any falls. He denies any olfactory/gustatory hallucinations, deja vu, rising epigastric sensation, myoclonic jerks.He has been told he has nerve damage in the right arm, but denies any numbness/tingling. He denies any headaches, dizziness, diplopia, dysarthria, dysphagia, bowel/bladder dysfunction. He had a normal birth and early development.  There is no history of febrile convulsions, CNS infections such as meningitis/encephalitis, significant traumatic brain injury, neurosurgical procedures, or family history of seizures.  I personally reviewed head CT done 07/29/15 which did not show any acute changes. There was generalized cerebral atrophy and chronic microvascular disease.  PAST MEDICAL HISTORY: Past Medical History:  Diagnosis Date  . Atrial fibrillation ([place])   . Atrial flutter ([place])    Possible history in the past - details unclear  . Bilateral pulmonary embolism ([place])    a. 10/2012 - V:Q scan high prob for Bilat PE in setting of dyspnea/hemoptysis-->anticoagulation started.  Marland Kitchen BPH (benign prostatic hyperplasia)   . CAD (coronary  artery disease)    Stent PCI in [place] and also [place] in the past - details not clear  . Chronic anticoagulation   . Chronic pain   . 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 COPD (chronic obstructive pulmonary disease) ([place])   . Depression   . Dyslipidemia   . Essential hypertension   . GERD (gastroesophageal reflux disease)   . GI bleed   . Gout   . Hyperlipidemia    Mixed  . Irritable bowel   . Osteoarthritis   . Osteoporosis   . Peripheral arterial disease ([place])    Dopplers Dr.Vyas office October, 2010, apparent occlusion anterior tibial arteries bilaterally., findings suggest hemodynamically significant stenosis of the proximal right provisional femoral artery and the mid left superficial artery.  Ankle-brachial indices indicate moderate stenosis within both lower extremities  . Posttraumatic stress disorder   . Recurrent UTI (urinary tract infection)   . Right leg DVT ([place])    a. 10/2012 - subocclusive DVT R popliteal vein.  . Wheelchair bound     MEDICATIONS: Current Outpatient Prescriptions on File Prior to Visit  Medication Sig Dispense Refill  . allopurinol (ZYLOPRIM) 100 MG tablet Take 100 mg by mouth every evening.     Jearl Klinefelter ELLIPTA 62.5-25 MCG/INH AEPB Inhale 1 puff into the lungs daily.    Marland Kitchen ascorbic acid (VITAMIN C) 500 MG tablet Take 500 mg by mouth daily.     Marland Kitchen atorvastatin (LIPITOR) 80 MG tablet Take 80 mg by mouth every evening.     . calcium-vitamin D (OSCAL WITH D) 500-200 MG-UNIT per tablet Take 1 tablet by mouth daily.     . cilostazol (PLETAL) 100 MG tablet Take 1 tablet by mouth daily.    Marland Kitchen COLCRYS 0.6 MG tablet Take 1 tablet by mouth daily as needed (gout pain).     Marland Kitchen diclofenac sodium (VOLTAREN) 1 % GEL Apply 1 application topically daily. Applied to feet    . digoxin (LANOXIN) 0.125 MG tablet Take 1 tablet (0.125 mg total) by mouth daily. 60 tablet 3  . diltiazem (CARDIZEM CD) 120 MG 24 hr capsule Take 1 capsule (120 mg total) by mouth daily. As directed based off blood pressure readings 30 capsule 6  . fenofibrate (TRICOR) 145 MG tablet Take 1 tablet (145 mg total) by mouth daily. (Patient taking differently: Take  145 mg by mouth every evening. ) 30 tablet 6  . fentaNYL (DURAGESIC - DOSED MCG/HR) 75 MCG/HR Place 75 mcg onto the skin every 3 (three) days. Change patch every 3 days    . ferrous sulfate 325 (65 FE) MG tablet Take 325 mg by mouth daily with breakfast.    . finasteride (PROSCAR) 5 MG tablet Take 5 mg by mouth daily.      . furosemide (LASIX) 40 MG tablet Take 40 mg by mouth every evening. *May take 80mg  as needed for swelling    . HYDROcodone-acetaminophen (NORCO) 7.5-325 MG tablet Take 1 tablet by mouth every 4 (four) hours as needed for moderate pain. 42 tablet 0  . lidocaine (LIDODERM) 5 % Place 1 patch onto the skin daily as needed (for pain). Every 12 hours as needed    . magnesium oxide (MAG-OX) 400 (241.3 MG) MG tablet Take 1 tablet (400 mg total) by mouth 2 (two) times daily. 60 tablet 2  . metoCLOPramide (REGLAN) 10 MG tablet Take 1 tablet (10 mg total) by mouth daily. 30 tablet 5  . metoprolol (LOPRESSOR) 100  MG tablet Take 1 tablet (100 mg total) by mouth 2 (two) times daily. 180 tablet 3  . Multiple Vitamin (MULTIVITAMIN) tablet Take 1 tablet by mouth daily.      . nitroGLYCERIN (NITROSTAT) 0.4 MG SL tablet Place 1 tablet (0.4 mg total) under the tongue every 5 (five) minutes x 3 doses as needed. 25 tablet 3  . Omega-3 Fatty Acids (FISH OIL) 1000 MG CAPS Take 2 capsules by mouth daily.     . pantoprazole (PROTONIX) 40 MG tablet Take 40 mg by mouth daily.      . potassium chloride SA (K-DUR,KLOR-CON) 20 MEQ tablet Take 2 tablets (40 mEq total) by mouth daily. (Patient taking differently: Take 20 mEq by mouth daily. *Give additional tablet if Lasix is increased due to swelling) 30 tablet 0  . PROAIR HFA 108 (90 Base) MCG/ACT inhaler Inhale 2 puffs into the lungs 2 (two) times daily as needed for wheezing or shortness of breath. 2 puffs twice daily    . psyllium (METAMUCIL SMOOTH TEXTURE) 28 % packet Take 1 packet by mouth at bedtime. (Patient taking differently: Take 1 packet by mouth at  bedtime as needed (bowels). )    . rivaroxaban (XARELTO) 20 MG TABS tablet Take 1 tablet (20 mg total) by mouth daily. 30 tablet 10  . rivastigmine (EXELON) 9.5 mg/24hr PLACE ONE PATCH ON SKIN ONCE DAILY. 30 patch 2  . tamsulosin (FLOMAX) 0.4 MG CAPS capsule Take 0.4 mg by mouth daily after supper.    . [DISCONTINUED] solifenacin (VESICARE) 10 MG tablet Take 10 mg by mouth daily.      No current facility-administered medications on file prior to visit.     ALLERGIES: Allergies  Allergen Reactions  . Omeprazole Other (See Comments)    Reaction unknown-possible upset stomach  . Other     Paper tape  . Penicillins Other (See Comments)    Has patient had a PCN reaction causing immediate rash, facial/tongue/throat swelling, SOB or lightheadedness with hypotension: No Has patient had a PCN reaction causing severe rash involving mucus membranes or skin necrosis: No Has patient had a PCN reaction that required hospitalization No Has patient had a PCN reaction occurring within the last 10 years: No If all of the above answers are "NO", then may proceed with Cephalosporin use.    Rapid heart rate; flushed  . Sulfonamide Derivatives Other (See Comments)    Rapid heart rate; flushed  . Iodine Rash    FAMILY HISTORY: Family History  Problem Relation Age of Onset  . Diabetes Daughter   . Hypertension Daughter   . Obesity Daughter   . Cancer Maternal Grandmother     SOCIAL HISTORY: Social History   Social History  . Marital status: Married    Spouse name: N/A  . Number of children: N/A  . Years of education: N/A   Occupational History  . Tour manager Retired    Retired  . Vet     Retired   Social History Main Topics  . Smoking status: Former Smoker    Packs/day: 1.00    Years: 30.00    Types: Cigarettes    Start date: 08/31/1956    Quit date: 08/17/2003  . Smokeless tobacco: Never Used     Comment: Patient smoked a pack a day for about 30 years  . Alcohol use No  .  Drug use: No  . Sexual activity: Not on file   Other Topics Concern  . Not on file  Social History Narrative   Lives with wife at home, [place].  Bedbound and mobilized wheelchair.  Two hours 5 days of week of HH aid.   1 child   Retired Geographical information systems officer          REVIEW OF SYSTEMS: Constitutional: No fevers, chills, or sweats, no generalized fatigue, change in appetite Eyes: No visual changes, double vision, eye pain Ear, nose and throat: No hearing loss, ear pain, nasal congestion, sore throat Cardiovascular: No chest pain, palpitations Respiratory:  No shortness of breath at rest or with exertion, wheezes GastrointestinaI: No nausea, vomiting, diarrhea, abdominal pain, fecal incontinence Genitourinary:  No dysuria, urinary retention or frequency Musculoskeletal:  + neck pain, back pain Integumentary: No rash, pruritus, skin lesions Neurological: as above Psychiatric: No depression, insomnia, anxiety Endocrine: No palpitations, fatigue, diaphoresis, mood swings, change in appetite, change in weight, increased thirst Hematologic/Lymphatic:  No anemia, purpura, petechiae. Allergic/Immunologic: no itchy/runny eyes, nasal congestion, recent allergic reactions, rashes  PHYSICAL EXAM: Vitals:   05/12/16 1520  BP: 130/80  Pulse: 99   General: No acute distress, sitting on wheelchair Head:  Normocephalic/atraumatic Neck: supple, no paraspinal tenderness, full range of motion Heart:  Regular rate and rhythm Lungs:  Clear to auscultation bilaterally Back: No paraspinal tenderness Skin/Extremities: No rash, no edema Neurological Exam: alert and oriented to person, place, and time. No aphasia or dysarthria. Fund of knowledge is appropriate.  Remote memory intact.  Attention and concentration are normal.    Able to name objects and repeat phrases. CDT 0/5 MMSE - Mini Mental State Exam 05/12/2016 09/01/2015  Orientation to time 4 4  Orientation to Place 4 4  Registration 3 3    Attention/ Calculation 1 1  Recall 0 0  Language- name 2 objects 2 2  Language- repeat 1 1  Language- follow 3 step command 2 2  Language- read & follow direction 1 1  Write a sentence 0 1  Copy design 0 1  Total score 18 20   Cranial nerves: CN I: not tested CN II: pupils equal, round and reactive to light, visual fields intact, fundi unremarkable. CN III, IV, VI:  full range of motion, no nystagmus, no ptosis CN V: intact to light touch CN VII: upper and lower face symmetric CN VIII: hearing intact to finger rub CN IX, X: gag intact, uvula midline CN XI: sternocleidomastoid and trapezius muscles intact CN XII: tongue midline Bulk & Tone: normal, no fasciculations. Motor: 5/5 on left UE, difficulty lifting right arm above gravity due to pain, 5/5 distally, both LE 3/5 proximal, 4/5 distal (similar to prior) Sensation: decreased pin and cold on left UE (similar to prior), intact to all modalities on both LE. No extinction to double simultaneous stimulation.  Deep Tendon Reflexes: +1 throughout except for absent ankle jerks bilaterally, no ankle clonus Plantar responses: downgoing bilaterally Cerebellar: no incoordination on finger to nose but with difficulty on right due to right shoulder pain Gait: not tested, wheelchair-bound Tremor: none  IMPRESSION: This is a pleasant 75 yo RH man with multiple medical issues including hypertension, hyperlipidemia, chronic pain, obesity, atrial fibrillation, PE and DVT on chronic anticoagulation, who presented with confusional episodes that have improved with starting Exelon patch. Head CT did not show any acute changes, there was generalized atrophy and chronic microvascular disease. MMSE today 18/30 (20/30 in January 2017), indicating mild dementia. Dose of Exelon patch will be increased to 13.3mg /24hr.  We discussed the importance of control of vascular risk factors, physical  exercise, and brain stimulation exercises for brain health. He will  follow-up in 6 months and knows to call for any changes.   Thank you for allowing me to participate in his care.  Please do not hesitate to call for any questions or concerns.  The duration of this appointment visit was 25 minutes of face-to-face time with the patient.  Greater than 50% of this time was spent in counseling, explanation of diagnosis, planning of further management, and coordination of care.   Ellouise Newer, M.D.   CC: Dr. Woody Seller

## 2016-05-17 ENCOUNTER — Institutional Professional Consult (permissible substitution): Payer: Medicare Other | Admitting: Pulmonary Disease

## 2016-05-21 ENCOUNTER — Ambulatory Visit (HOSPITAL_COMMUNITY)
Admission: RE | Admit: 2016-05-21 | Discharge: 2016-05-21 | Disposition: A | Discharge status: Home / Self Care | Payer: Medicare Other | Source: Ambulatory Visit | Attending: Emergency Medicine | Admitting: Emergency Medicine

## 2016-05-21 ENCOUNTER — Ambulatory Visit (HOSPITAL_COMMUNITY)
Admission: AD | Admit: 2016-05-21 | Discharge: 2016-05-21 | Disposition: A | Discharge status: Home / Self Care | Payer: Medicare Other | Source: Other Acute Inpatient Hospital | Attending: Emergency Medicine | Admitting: Emergency Medicine

## 2016-05-21 ENCOUNTER — Other Ambulatory Visit (HOSPITAL_COMMUNITY): Payer: Self-pay | Admitting: Emergency Medicine

## 2016-05-21 DIAGNOSIS — R4182 Altered mental status, unspecified: Secondary | ICD-10-CM | POA: Insufficient documentation

## 2016-05-21 DIAGNOSIS — R279 Unspecified lack of coordination: Secondary | ICD-10-CM | POA: Diagnosis not present

## 2016-05-21 DIAGNOSIS — H0520 Unspecified exophthalmos: Secondary | ICD-10-CM | POA: Diagnosis not present

## 2016-05-21 DIAGNOSIS — J0130 Acute sphenoidal sinusitis, unspecified: Secondary | ICD-10-CM | POA: Insufficient documentation

## 2016-05-21 DIAGNOSIS — Z7401 Bed confinement status: Secondary | ICD-10-CM | POA: Diagnosis not present

## 2016-05-21 DIAGNOSIS — J440 Chronic obstructive pulmonary disease with acute lower respiratory infection: Secondary | ICD-10-CM | POA: Diagnosis not present

## 2016-05-21 DIAGNOSIS — F0281 Dementia in other diseases classified elsewhere with behavioral disturbance: Secondary | ICD-10-CM | POA: Diagnosis not present

## 2016-05-21 DIAGNOSIS — J189 Pneumonia, unspecified organism: Secondary | ICD-10-CM | POA: Diagnosis not present

## 2016-05-21 DIAGNOSIS — F0390 Unspecified dementia without behavioral disturbance: Secondary | ICD-10-CM | POA: Diagnosis not present

## 2016-05-21 DIAGNOSIS — I2510 Atherosclerotic heart disease of native coronary artery without angina pectoris: Secondary | ICD-10-CM | POA: Diagnosis not present

## 2016-05-21 DIAGNOSIS — R918 Other nonspecific abnormal finding of lung field: Secondary | ICD-10-CM | POA: Diagnosis not present

## 2016-05-21 DIAGNOSIS — B9689 Other specified bacterial agents as the cause of diseases classified elsewhere: Secondary | ICD-10-CM | POA: Diagnosis not present

## 2016-05-21 DIAGNOSIS — R0902 Hypoxemia: Secondary | ICD-10-CM | POA: Diagnosis not present

## 2016-05-21 DIAGNOSIS — Z86711 Personal history of pulmonary embolism: Secondary | ICD-10-CM | POA: Diagnosis not present

## 2016-05-21 DIAGNOSIS — N390 Urinary tract infection, site not specified: Secondary | ICD-10-CM | POA: Diagnosis not present

## 2016-05-22 ENCOUNTER — Encounter: Payer: Self-pay | Admitting: Cardiology

## 2016-05-22 DIAGNOSIS — R279 Unspecified lack of coordination: Secondary | ICD-10-CM | POA: Diagnosis not present

## 2016-05-22 DIAGNOSIS — B9689 Other specified bacterial agents as the cause of diseases classified elsewhere: Secondary | ICD-10-CM | POA: Diagnosis present

## 2016-05-22 DIAGNOSIS — Z7902 Long term (current) use of antithrombotics/antiplatelets: Secondary | ICD-10-CM | POA: Diagnosis not present

## 2016-05-22 DIAGNOSIS — J189 Pneumonia, unspecified organism: Secondary | ICD-10-CM | POA: Diagnosis present

## 2016-05-22 DIAGNOSIS — K449 Diaphragmatic hernia without obstruction or gangrene: Secondary | ICD-10-CM | POA: Diagnosis present

## 2016-05-22 DIAGNOSIS — I10 Essential (primary) hypertension: Secondary | ICD-10-CM | POA: Diagnosis present

## 2016-05-22 DIAGNOSIS — I4891 Unspecified atrial fibrillation: Secondary | ICD-10-CM | POA: Diagnosis present

## 2016-05-22 DIAGNOSIS — E785 Hyperlipidemia, unspecified: Secondary | ICD-10-CM | POA: Diagnosis present

## 2016-05-22 DIAGNOSIS — Z955 Presence of coronary angioplasty implant and graft: Secondary | ICD-10-CM | POA: Diagnosis not present

## 2016-05-22 DIAGNOSIS — J440 Chronic obstructive pulmonary disease with acute lower respiratory infection: Secondary | ICD-10-CM | POA: Diagnosis present

## 2016-05-22 DIAGNOSIS — Z86711 Personal history of pulmonary embolism: Secondary | ICD-10-CM | POA: Diagnosis not present

## 2016-05-22 DIAGNOSIS — M1990 Unspecified osteoarthritis, unspecified site: Secondary | ICD-10-CM | POA: Diagnosis present

## 2016-05-22 DIAGNOSIS — Z7401 Bed confinement status: Secondary | ICD-10-CM | POA: Diagnosis not present

## 2016-05-22 DIAGNOSIS — Z981 Arthrodesis status: Secondary | ICD-10-CM | POA: Diagnosis not present

## 2016-05-22 DIAGNOSIS — Z882 Allergy status to sulfonamides status: Secondary | ICD-10-CM | POA: Diagnosis not present

## 2016-05-22 DIAGNOSIS — Z8614 Personal history of Methicillin resistant Staphylococcus aureus infection: Secondary | ICD-10-CM | POA: Diagnosis not present

## 2016-05-22 DIAGNOSIS — I2510 Atherosclerotic heart disease of native coronary artery without angina pectoris: Secondary | ICD-10-CM | POA: Diagnosis present

## 2016-05-22 DIAGNOSIS — F4310 Post-traumatic stress disorder, unspecified: Secondary | ICD-10-CM | POA: Diagnosis present

## 2016-05-22 DIAGNOSIS — N400 Enlarged prostate without lower urinary tract symptoms: Secondary | ICD-10-CM | POA: Diagnosis present

## 2016-05-22 DIAGNOSIS — M109 Gout, unspecified: Secondary | ICD-10-CM | POA: Diagnosis present

## 2016-05-22 DIAGNOSIS — Z880 Allergy status to penicillin: Secondary | ICD-10-CM | POA: Diagnosis not present

## 2016-05-22 DIAGNOSIS — N390 Urinary tract infection, site not specified: Secondary | ICD-10-CM | POA: Diagnosis present

## 2016-05-22 DIAGNOSIS — Z888 Allergy status to other drugs, medicaments and biological substances status: Secondary | ICD-10-CM | POA: Diagnosis not present

## 2016-05-22 DIAGNOSIS — Z96653 Presence of artificial knee joint, bilateral: Secondary | ICD-10-CM | POA: Diagnosis present

## 2016-05-22 DIAGNOSIS — Z79891 Long term (current) use of opiate analgesic: Secondary | ICD-10-CM | POA: Diagnosis not present

## 2016-05-22 DIAGNOSIS — Z79899 Other long term (current) drug therapy: Secondary | ICD-10-CM | POA: Diagnosis not present

## 2016-05-22 DIAGNOSIS — Z7901 Long term (current) use of anticoagulants: Secondary | ICD-10-CM | POA: Diagnosis not present

## 2016-05-23 ENCOUNTER — Encounter: Payer: Self-pay | Admitting: Neurology

## 2016-06-03 ENCOUNTER — Telehealth: Payer: Self-pay | Admitting: Neurology

## 2016-06-03 DIAGNOSIS — G4733 Obstructive sleep apnea (adult) (pediatric): Secondary | ICD-10-CM | POA: Diagnosis not present

## 2016-06-03 NOTE — Telephone Encounter (Signed)
PT's wife called and has a question about his medication/Dawn CB# [phone]

## 2016-06-03 NOTE — Telephone Encounter (Signed)
Patients wife called and states patient feels like he is more on the edge with the Rivastigmine 13.3mg  increase and wants to know if patient can go back to taking Rivastigmine 9.5mg .

## 2016-06-04 MED ORDER — RIVASTIGMINE 9.5 MG/24HR TD PT24: 9.5000 mg | MEDICATED_PATCH | Freq: Every day | TRANSDERMAL | 12 refills | Status: DC

## 2016-06-04 NOTE — Telephone Encounter (Signed)
Patient's wife notified and RX sent to pharmacy.

## 2016-06-04 NOTE — Telephone Encounter (Signed)
Ok to go back down to 9.5mg , pls send in new Rx for rivastigmine patch 9.5mg /24hr, #30 with 6 refills, thanks

## 2016-06-04 NOTE — Telephone Encounter (Signed)
Please advise 

## 2016-06-08 ENCOUNTER — Encounter: Payer: Self-pay | Admitting: Pulmonary Disease

## 2016-06-08 DIAGNOSIS — M79675 Pain in left toe(s): Secondary | ICD-10-CM | POA: Diagnosis not present

## 2016-06-08 DIAGNOSIS — G4733 Obstructive sleep apnea (adult) (pediatric): Secondary | ICD-10-CM | POA: Diagnosis not present

## 2016-06-08 DIAGNOSIS — I70203 Unspecified atherosclerosis of native arteries of extremities, bilateral legs: Secondary | ICD-10-CM | POA: Diagnosis not present

## 2016-06-09 ENCOUNTER — Other Ambulatory Visit: Payer: Self-pay | Admitting: *Deleted

## 2016-06-09 DIAGNOSIS — J449 Chronic obstructive pulmonary disease, unspecified: Secondary | ICD-10-CM

## 2016-06-10 ENCOUNTER — Encounter: Payer: Self-pay | Admitting: *Deleted

## 2016-06-10 DIAGNOSIS — M545 Low back pain: Secondary | ICD-10-CM | POA: Diagnosis not present

## 2016-06-10 DIAGNOSIS — Z299 Encounter for prophylactic measures, unspecified: Secondary | ICD-10-CM | POA: Diagnosis not present

## 2016-06-10 DIAGNOSIS — R269 Unspecified abnormalities of gait and mobility: Secondary | ICD-10-CM | POA: Diagnosis not present

## 2016-06-10 DIAGNOSIS — J449 Chronic obstructive pulmonary disease, unspecified: Secondary | ICD-10-CM | POA: Diagnosis not present

## 2016-06-10 DIAGNOSIS — Z6839 Body mass index (BMI) 39.0-39.9, adult: Secondary | ICD-10-CM | POA: Diagnosis not present

## 2016-06-11 ENCOUNTER — Telehealth: Payer: Self-pay | Admitting: *Deleted

## 2016-06-11 NOTE — Telephone Encounter (Signed)
Patient's wife informed and copy sent to PCP. 

## 2016-06-11 NOTE — Telephone Encounter (Signed)
-----   Message from Satira Sark, MD sent at 06/11/2016  9:34 AM EDT ----- Results reviewed. Digoxin level normal range. A copy of this test should be forwarded to Glenda Chroman, MD.

## 2016-06-16 DIAGNOSIS — M12812 Other specific arthropathies, not elsewhere classified, left shoulder: Secondary | ICD-10-CM | POA: Diagnosis not present

## 2016-06-16 DIAGNOSIS — M19012 Primary osteoarthritis, left shoulder: Secondary | ICD-10-CM | POA: Diagnosis not present

## 2016-06-16 DIAGNOSIS — M12811 Other specific arthropathies, not elsewhere classified, right shoulder: Secondary | ICD-10-CM | POA: Diagnosis not present

## 2016-06-28 ENCOUNTER — Telehealth: Payer: Self-pay | Admitting: Pulmonary Disease

## 2016-06-28 DIAGNOSIS — G4733 Obstructive sleep apnea (adult) (pediatric): Secondary | ICD-10-CM

## 2016-06-28 NOTE — Telephone Encounter (Signed)
Notes Recorded by Marshell Garfinkel, MD on 06/15/2016 at 2:27 PM EDT Please inform the patient that the sleep study shows severe OSA. We cannot get a titration study as he would not be able to come in for the procedure given his immobility. Order auto CPAP 5-15 ------------  Spoke with pt's wife, aware of results/recs.  Order for cpap placed.  Nothing further needed.

## 2016-06-29 DIAGNOSIS — M6281 Muscle weakness (generalized): Secondary | ICD-10-CM | POA: Diagnosis not present

## 2016-07-12 ENCOUNTER — Encounter: Payer: Self-pay | Admitting: Pulmonary Disease

## 2016-07-12 ENCOUNTER — Ambulatory Visit (INDEPENDENT_AMBULATORY_CARE_PROVIDER_SITE_OTHER): Payer: Medicare Other | Admitting: Pulmonary Disease

## 2016-07-12 VITALS — BP 122/68 | HR 88

## 2016-07-12 DIAGNOSIS — J449 Chronic obstructive pulmonary disease, unspecified: Secondary | ICD-10-CM

## 2016-07-12 DIAGNOSIS — I2510 Atherosclerotic heart disease of native coronary artery without angina pectoris: Secondary | ICD-10-CM

## 2016-07-12 NOTE — Progress Notes (Addendum)
Bryce Berry    [id number]    1940-10-09  Primary Care Physician:Dhruv Barbette Reichmann, MD  Referring Physician: Glenda Chroman, MD [place], [place] [id number]  Chief complaint:  C Follow up for COPD, bronchitis. OSA  HPI: Bryce Berry is a 75 year old with past history of COPD. He has complains of prolonged cough for the past few months associated with greenish sputum production. He has symptoms of dyspnea on exertion, occasional wheezing. He was evaluated at his primary care doctor Dr. Woody Seller enterococcus of Levaquin. He states that the symptoms are improved today.  He does not have PFTs on record and has never been seen by pulmonologist. He is currently maintained on an Anoro. He has history of A fib, recurrent DVT, PE and is on long-term Xarelto anticoagulation. He is wheelchair bound from peripheral vascular disease, ankle fracture. He has history of snoring, witnessed apneas.   Outpatient Encounter Prescriptions as of 07/12/2016  Medication Sig  . allopurinol (ZYLOPRIM) 100 MG tablet Take 100 mg by mouth every evening.   Jearl Klinefelter ELLIPTA 62.5-25 MCG/INH AEPB Inhale 1 puff into the lungs daily.  Marland Kitchen ascorbic acid (VITAMIN C) 500 MG tablet Take 500 mg by mouth daily.   Marland Kitchen atorvastatin (LIPITOR) 80 MG tablet Take 80 mg by mouth every evening.   . calcium-vitamin D (OSCAL WITH D) 500-200 MG-UNIT per tablet Take 1 tablet by mouth daily.   . cilostazol (PLETAL) 100 MG tablet Take 1 tablet by mouth daily.  Marland Kitchen COLCRYS 0.6 MG tablet Take 1 tablet by mouth daily as needed (gout pain).   Marland Kitchen diclofenac sodium (VOLTAREN) 1 % GEL Apply 1 application topically daily. Applied to feet  . digoxin (LANOXIN) 0.125 MG tablet Take 1 tablet (0.125 mg total) by mouth daily.  Marland Kitchen diltiazem (CARDIZEM CD) 120 MG 24 hr capsule Take 1 capsule (120 mg total) by mouth daily. As directed based off blood pressure readings  . fenofibrate (TRICOR) 145 MG tablet Take 1 tablet (145 mg total) by mouth daily. (Patient  taking differently: Take 145 mg by mouth every evening. )  . fentaNYL (DURAGESIC - DOSED MCG/HR) 75 MCG/HR Place 75 mcg onto the skin every 3 (three) days. Change patch every 3 days  . ferrous sulfate 325 (65 FE) MG tablet Take 325 mg by mouth daily with breakfast.  . finasteride (PROSCAR) 5 MG tablet Take 5 mg by mouth daily.    . fluticasone (FLONASE) 50 MCG/ACT nasal spray Place 2 sprays into both nostrils daily.  . furosemide (LASIX) 40 MG tablet Take 40 mg by mouth every evening. *May take 80mg  as needed for swelling  . HYDROcodone-acetaminophen (NORCO) 7.5-325 MG tablet Take 1 tablet by mouth every 4 (four) hours as needed for moderate pain.  Marland Kitchen lidocaine (LIDODERM) 5 % Place 1 patch onto the skin daily as needed (for pain). Every 12 hours as needed  . magnesium oxide (MAG-OX) 400 (241.3 MG) MG tablet Take 1 tablet (400 mg total) by mouth 2 (two) times daily.  . metoprolol (LOPRESSOR) 100 MG tablet Take 1 tablet (100 mg total) by mouth 2 (two) times daily.  . Multiple Vitamin (MULTIVITAMIN) tablet Take 1 tablet by mouth daily.    . nitroGLYCERIN (NITROSTAT) 0.4 MG SL tablet Place 1 tablet (0.4 mg total) under the tongue every 5 (five) minutes x 3 doses as needed.  . Omega-3 Fatty Acids (FISH OIL) 1000 MG CAPS Take 2 capsules by mouth daily.   Marland Kitchen  pantoprazole (PROTONIX) 40 MG tablet Take 40 mg by mouth 2 (two) times daily.   . potassium chloride SA (K-DUR,KLOR-CON) 20 MEQ tablet Take 2 tablets (40 mEq total) by mouth daily. (Patient taking differently: Take 20 mEq by mouth daily. *Give additional tablet if Lasix is increased due to swelling)  . PROAIR HFA 108 (90 Base) MCG/ACT inhaler Inhale 2 puffs into the lungs 2 (two) times daily as needed for wheezing or shortness of breath. 2 puffs twice daily  . psyllium (METAMUCIL SMOOTH TEXTURE) 28 % packet Take 1 packet by mouth at bedtime. (Patient taking differently: Take 1 packet by mouth at bedtime as needed (bowels). )  . rivaroxaban (XARELTO) 20  MG TABS tablet Take 1 tablet (20 mg total) by mouth daily.  . rivastigmine (EXELON) 9.5 mg/24hr Place 1 patch (9.5 mg total) onto the skin daily.  . tamsulosin (FLOMAX) 0.4 MG CAPS capsule Take 0.4 mg by mouth daily after supper.  . [DISCONTINUED] metoCLOPramide (REGLAN) 10 MG tablet Take 1 tablet (10 mg total) by mouth daily. (Patient not taking: Reported on 07/12/2016)   No facility-administered encounter medications on file as of 07/12/2016.     Allergies as of 07/12/2016 - Review Complete 07/12/2016  Allergen Reaction Noted  . Omeprazole Other (See Comments) 07/29/2015  . Other  07/29/2015  . Penicillins Other (See Comments)   . Sulfonamide derivatives Other (See Comments)   . Iodine Rash     Past Medical History:  Diagnosis Date  . Atrial fibrillation ([place])   . Atrial flutter ([place])    Possible history in the past - details unclear  . Bilateral pulmonary embolism ([place])    a. 10/2012 - V:Q scan high prob for Bilat PE in setting of dyspnea/hemoptysis-->anticoagulation started.  Marland Kitchen BPH (benign prostatic hyperplasia)   . CAD (coronary artery disease)    Stent PCI in [place] and also [place] in the past - details not clear  . Chronic anticoagulation   . Chronic pain   . 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 COPD (chronic obstructive pulmonary disease) ([place])   . Depression   . Dyslipidemia   . Essential hypertension   . GERD (gastroesophageal reflux disease)   . GI bleed   . Gout   . Hyperlipidemia    Mixed  . Irritable bowel   . Osteoarthritis   . Osteoporosis   . Peripheral arterial disease ([place])    Dopplers Dr.Vyas office October, 2010, apparent occlusion anterior tibial arteries bilaterally., findings suggest hemodynamically significant stenosis of the proximal right provisional femoral artery and the mid left superficial artery.  Ankle-brachial indices indicate moderate stenosis within both lower extremities  . Posttraumatic stress disorder   . Recurrent  UTI (urinary tract infection)   . Right leg DVT ([place])    a. 10/2012 - subocclusive DVT R popliteal vein.  . Wheelchair bound     Past Surgical History:  Procedure Laterality Date  . [place]  . CATARACT EXTRACTION    . COLON RESECTION    . COLONOSCOPY N/A 03/13/2013   Procedure: COLONOSCOPY;  Surgeon: Rogene Houston, MD;  Location[facility];  Service: Endoscopy;  Laterality: N/A;  730  . COLONOSCOPY N/A 12/22/2014   Procedure: COLONOSCOPY;  Surgeon: Rogene Houston, MD;  Location: [facility];  Service: Endoscopy;  Laterality: N/A;  . CORONARY ANGIOPLASTY WITH STENT PLACEMENT     x 4  . GIVENS CAPSULE STUDY N/A 12/24/2014  Procedure: GIVENS CAPSULE STUDY;  Surgeon: Rogene Houston, MD;  Location: [facility];  Service: Endoscopy;  Laterality: N/A;  . KNEE ARTHROPLASTY     Right  . PROSTATE SURGERY    . TOTAL HIP ARTHROPLASTY     Right-complicated hx  . TOTAL KNEE ARTHROPLASTY     Left    Family History  Problem Relation Age of Onset  . Diabetes Daughter   . Hypertension Daughter   . Obesity Daughter   . Cancer Maternal Grandmother     Social History   Social History  . Marital status: Married    Spouse name: N/A  . Number of children: N/A  . Years of education: N/A   Occupational History  . Tour manager Retired    Retired  . Vet     Retired   Social History Main Topics  . Smoking status: Former Smoker    Packs/day: 1.00    Years: 30.00    Types: Cigarettes    Start date: 08/31/1956    Quit date: 08/17/2003  . Smokeless tobacco: Never Used     Comment: Patient smoked a pack a day for about 30 years  . Alcohol use No  . Drug use: No  . Sexual activity: Not on file   Other Topics Concern  . Not on file   Social History Narrative   Lives with wife at home, [place].  Bedbound and mobilized wheelchair.  Two hours 5 days of week of HH aid.   1 child   Retired Geographical information systems officer         Review of systems: Review of Systems    Constitutional: Negative for fever and chills.  HENT: Negative.   Eyes: Negative for blurred vision.  Respiratory: as per HPI  Cardiovascular: Negative for chest pain and palpitations.  Gastrointestinal: Negative for vomiting, diarrhea, blood per rectum. Genitourinary: Negative for dysuria, urgency, frequency and hematuria.  Musculoskeletal: Negative for myalgias, back pain and joint pain.  Skin: Negative for itching and rash.  Neurological: Negative for dizziness, tremors, focal weakness, seizures and loss of consciousness.  Endo/Heme/Allergies: Negative for environmental allergies.  Psychiatric/Behavioral: Negative for depression, suicidal ideas and hallucinations.  All other systems reviewed and are negative.   Physical Exam: Blood pressure 132/76, pulse 79, SpO2 98 %. Gen:      No acute distress HEENT:  EOMI, sclera anicteric Neck:     No masses; no thyromegaly Lungs:    Clear to auscultation bilaterally; normal respiratory effort CV:         Regular rate and rhythm; no murmurs Abd:      + bowel sounds; soft, non-tender; no palpable masses, no distension Ext:    No edema; adequate peripheral perfusion Skin:      Warm and dry; no rash Neuro: alert and oriented x 3 Psych: normal mood and affect  Data Reviewed: CT scan 01/03/15 > Small LLL PE, b/l lower lobe atelectasis. Images reviewed.  CXR 05/06/16- Stable cardiomegaly, pulmonary venous hypertension.Images reviewed.  Echo 10/17/12 - Left ventricle: The cavity size was normal. There was mild concentric hypertrophy. Systolic function was normal. The estimated ejection fraction was in the range of 55% to 60%. - Mitral valve: Calcified annulus. - Left atrium: The atrium was mildly to moderately dilated. - Right atrium: The atrium was mildly dilated. - Pulmonary arteries: PA peak pressure: 60mm Hg (S).  Sleep study 06/03/16 Severe OSA, AHI sent to 72.3, O2 sats 83%  Assessment:  #1 Chronic bronchitis, COPD.  He has  had several episodes of bronchitis, respiratory infection the past treated with antibiotics. He is currently stable on an Anoro. He will be continued this and use the albuterol inhaler, nebulizers on a when necessary basis. I'll schedule him for Chest x ray and pulmonary function tests for further evaluation of his COPD.  #2 PE, DVT He has history of recurrent PE, DVT. He is on long-term anticoagulation with Xarelto.  #3 Suspected OSA. Severe OSA confirmed on home sleep study. Autoset CPAP 5-15 ordered but not set up yet. We will review download on return visit.  Plan/Recommendations: - Continue anoro, albuterol, duonebs PRN - PFTS - Start CPAP 5-15 - Continue xarelto  Marshell Garfinkel MD [facility] Pager [phone] 07/12/2016, 11:02 AM  CC: Glenda Chroman, MD   Addendum

## 2016-07-12 NOTE — Patient Instructions (Signed)
We will get her started on the CPAP as ordered. Please schedule your pulmonary function tests and continue the inhalers as prescribed.  Return to clinic in 3 months.

## 2016-07-22 DIAGNOSIS — Z993 Dependence on wheelchair: Secondary | ICD-10-CM | POA: Diagnosis not present

## 2016-07-22 DIAGNOSIS — J449 Chronic obstructive pulmonary disease, unspecified: Secondary | ICD-10-CM | POA: Diagnosis not present

## 2016-07-22 DIAGNOSIS — M545 Low back pain: Secondary | ICD-10-CM | POA: Diagnosis not present

## 2016-07-22 DIAGNOSIS — M1990 Unspecified osteoarthritis, unspecified site: Secondary | ICD-10-CM | POA: Diagnosis not present

## 2016-07-22 DIAGNOSIS — Z299 Encounter for prophylactic measures, unspecified: Secondary | ICD-10-CM | POA: Diagnosis not present

## 2016-07-23 DIAGNOSIS — N390 Urinary tract infection, site not specified: Secondary | ICD-10-CM | POA: Diagnosis not present

## 2016-07-28 DIAGNOSIS — M12811 Other specific arthropathies, not elsewhere classified, right shoulder: Secondary | ICD-10-CM | POA: Diagnosis not present

## 2016-07-28 DIAGNOSIS — M12812 Other specific arthropathies, not elsewhere classified, left shoulder: Secondary | ICD-10-CM | POA: Diagnosis not present

## 2016-08-03 ENCOUNTER — Telehealth: Payer: Self-pay | Admitting: Neurology

## 2016-08-03 NOTE — Telephone Encounter (Signed)
Please advise 

## 2016-08-03 NOTE — Telephone Encounter (Signed)
Patient's wife advised of below , verbalized understanding.

## 2016-08-03 NOTE — Telephone Encounter (Signed)
Agree, go to ER, he may have infection. Thanks

## 2016-08-03 NOTE — Telephone Encounter (Signed)
Bryce Berry Mar 30, 2041. His # [id number] His Wife Staci Righter. Hand Tremors and Stares. Vital signs are good but slow to moving or responding. She would like you to please call her back. She is concerned. He was shedudled to see Dr. Delice Lesch in April for a 6 month follow up. I moved him up to February and added him to the wait list. She said she could not wait that long, that she may have to take him to the ER. Thank you

## 2016-08-04 DIAGNOSIS — M159 Polyosteoarthritis, unspecified: Secondary | ICD-10-CM | POA: Diagnosis not present

## 2016-08-04 DIAGNOSIS — J449 Chronic obstructive pulmonary disease, unspecified: Secondary | ICD-10-CM | POA: Diagnosis not present

## 2016-08-04 DIAGNOSIS — I10 Essential (primary) hypertension: Secondary | ICD-10-CM | POA: Diagnosis not present

## 2016-08-04 DIAGNOSIS — I2510 Atherosclerotic heart disease of native coronary artery without angina pectoris: Secondary | ICD-10-CM | POA: Diagnosis not present

## 2016-08-05 DIAGNOSIS — F0390 Unspecified dementia without behavioral disturbance: Secondary | ICD-10-CM | POA: Diagnosis not present

## 2016-08-05 DIAGNOSIS — Z8639 Personal history of other endocrine, nutritional and metabolic disease: Secondary | ICD-10-CM | POA: Diagnosis not present

## 2016-08-05 DIAGNOSIS — Z79899 Other long term (current) drug therapy: Secondary | ICD-10-CM | POA: Diagnosis not present

## 2016-08-05 DIAGNOSIS — I10 Essential (primary) hypertension: Secondary | ICD-10-CM | POA: Diagnosis not present

## 2016-08-05 DIAGNOSIS — Z7401 Bed confinement status: Secondary | ICD-10-CM | POA: Diagnosis not present

## 2016-08-05 DIAGNOSIS — I4891 Unspecified atrial fibrillation: Secondary | ICD-10-CM | POA: Diagnosis not present

## 2016-08-05 DIAGNOSIS — T40601A Poisoning by unspecified narcotics, accidental (unintentional), initial encounter: Secondary | ICD-10-CM | POA: Diagnosis not present

## 2016-08-05 DIAGNOSIS — Z86711 Personal history of pulmonary embolism: Secondary | ICD-10-CM | POA: Diagnosis not present

## 2016-08-05 DIAGNOSIS — I6782 Cerebral ischemia: Secondary | ICD-10-CM | POA: Diagnosis not present

## 2016-08-05 DIAGNOSIS — Z7901 Long term (current) use of anticoagulants: Secondary | ICD-10-CM | POA: Diagnosis not present

## 2016-08-05 DIAGNOSIS — Z8614 Personal history of Methicillin resistant Staphylococcus aureus infection: Secondary | ICD-10-CM | POA: Diagnosis not present

## 2016-08-05 DIAGNOSIS — Z955 Presence of coronary angioplasty implant and graft: Secondary | ICD-10-CM | POA: Diagnosis not present

## 2016-08-05 DIAGNOSIS — R279 Unspecified lack of coordination: Secondary | ICD-10-CM | POA: Diagnosis not present

## 2016-08-05 DIAGNOSIS — J449 Chronic obstructive pulmonary disease, unspecified: Secondary | ICD-10-CM | POA: Diagnosis not present

## 2016-08-05 DIAGNOSIS — R402421 Glasgow coma scale score 9-12, in the field [EMT or ambulance]: Secondary | ICD-10-CM | POA: Diagnosis not present

## 2016-08-05 DIAGNOSIS — R4182 Altered mental status, unspecified: Secondary | ICD-10-CM | POA: Diagnosis not present

## 2016-08-05 DIAGNOSIS — Z79891 Long term (current) use of opiate analgesic: Secondary | ICD-10-CM | POA: Diagnosis not present

## 2016-08-05 DIAGNOSIS — M1990 Unspecified osteoarthritis, unspecified site: Secondary | ICD-10-CM | POA: Diagnosis not present

## 2016-08-30 DIAGNOSIS — J449 Chronic obstructive pulmonary disease, unspecified: Secondary | ICD-10-CM | POA: Diagnosis not present

## 2016-08-30 DIAGNOSIS — I10 Essential (primary) hypertension: Secondary | ICD-10-CM | POA: Diagnosis not present

## 2016-08-30 DIAGNOSIS — M159 Polyosteoarthritis, unspecified: Secondary | ICD-10-CM | POA: Diagnosis not present

## 2016-08-30 DIAGNOSIS — I2510 Atherosclerotic heart disease of native coronary artery without angina pectoris: Secondary | ICD-10-CM | POA: Diagnosis not present

## 2016-08-31 ENCOUNTER — Telehealth (INDEPENDENT_AMBULATORY_CARE_PROVIDER_SITE_OTHER): Payer: Self-pay | Admitting: Internal Medicine

## 2016-08-31 NOTE — Telephone Encounter (Signed)
Patient's spouse, Enid Derry called and is requesting to speak to you regarding her husband.  [phone]

## 2016-09-02 ENCOUNTER — Encounter: Payer: Self-pay | Admitting: Pulmonary Disease

## 2016-09-09 NOTE — Telephone Encounter (Signed)
I spoke with wife. Will call back this afternoon. She had an appt and could not talk

## 2016-09-16 DIAGNOSIS — M1990 Unspecified osteoarthritis, unspecified site: Secondary | ICD-10-CM | POA: Diagnosis not present

## 2016-09-16 DIAGNOSIS — Z299 Encounter for prophylactic measures, unspecified: Secondary | ICD-10-CM | POA: Diagnosis not present

## 2016-09-16 DIAGNOSIS — I10 Essential (primary) hypertension: Secondary | ICD-10-CM | POA: Diagnosis not present

## 2016-09-16 DIAGNOSIS — R05 Cough: Secondary | ICD-10-CM | POA: Diagnosis not present

## 2016-09-16 DIAGNOSIS — J449 Chronic obstructive pulmonary disease, unspecified: Secondary | ICD-10-CM | POA: Diagnosis not present

## 2016-09-16 DIAGNOSIS — R197 Diarrhea, unspecified: Secondary | ICD-10-CM | POA: Diagnosis not present

## 2016-09-16 DIAGNOSIS — I4891 Unspecified atrial fibrillation: Secondary | ICD-10-CM | POA: Diagnosis not present

## 2016-09-24 ENCOUNTER — Encounter: Payer: Self-pay | Admitting: Pulmonary Disease

## 2016-09-24 NOTE — Progress Notes (Unsigned)
CPAP download 08/04/16 to 09/02/16 Days greater than 4 hours - 0% Days less than 4 hours - 13% Average use/day- 1 min  Pt is not compliant with CPAP. Will need to address at next visit.  Marshell Garfinkel MD [facility] Pager [phone] If no answer or after 3pm call: [phone] 09/24/2016, 1:22 PM

## 2016-10-04 DIAGNOSIS — I2510 Atherosclerotic heart disease of native coronary artery without angina pectoris: Secondary | ICD-10-CM | POA: Diagnosis not present

## 2016-10-04 DIAGNOSIS — M159 Polyosteoarthritis, unspecified: Secondary | ICD-10-CM | POA: Diagnosis not present

## 2016-10-04 DIAGNOSIS — J449 Chronic obstructive pulmonary disease, unspecified: Secondary | ICD-10-CM | POA: Diagnosis not present

## 2016-10-04 DIAGNOSIS — I10 Essential (primary) hypertension: Secondary | ICD-10-CM | POA: Diagnosis not present

## 2016-10-11 ENCOUNTER — Ambulatory Visit: Payer: Medicare Other | Admitting: Neurology

## 2016-10-12 ENCOUNTER — Ambulatory Visit: Payer: Medicare Other | Admitting: Pulmonary Disease

## 2016-10-14 ENCOUNTER — Ambulatory Visit: Payer: Medicare Other | Admitting: Pulmonary Disease

## 2016-10-18 IMAGING — CT CT HEAD W/O CM
3 series · 15 of 47 positions shown, 18 images · non-contrast
Comparison: 07/29/2015

CLINICAL DATA: 75-year-old male with a history of altered mental
status

EXAM:
CT HEAD WITHOUT CONTRAST
TECHNIQUE: Contiguous axial images were obtained from the base of the skull
through the vertex without intravenous contrast.

[Series 2: head wo · axial · 0.49mm/px · z∈[+1461,+1596]mm · 9 of 33 slices shown, 12 images]
[im 3/33  brain]
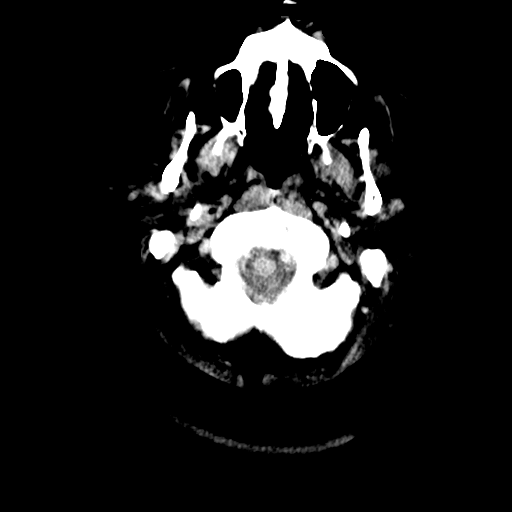
[im 3/33  bone]
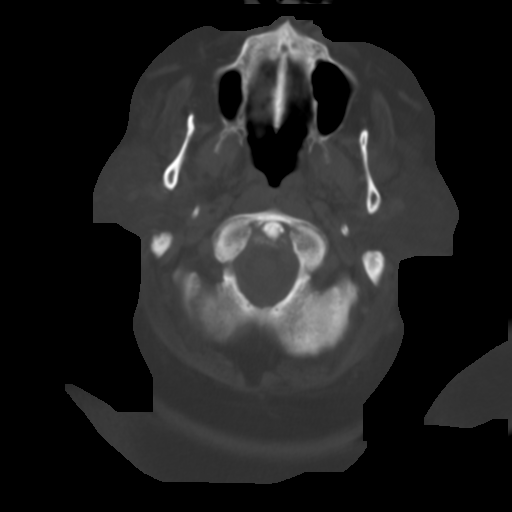
[im 6/33  brain]
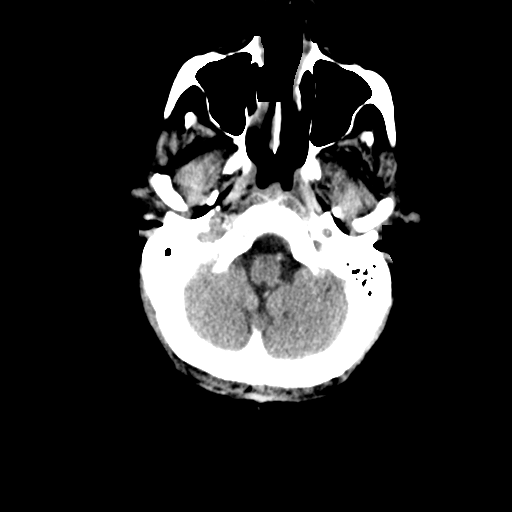
[im 9/33  brain]
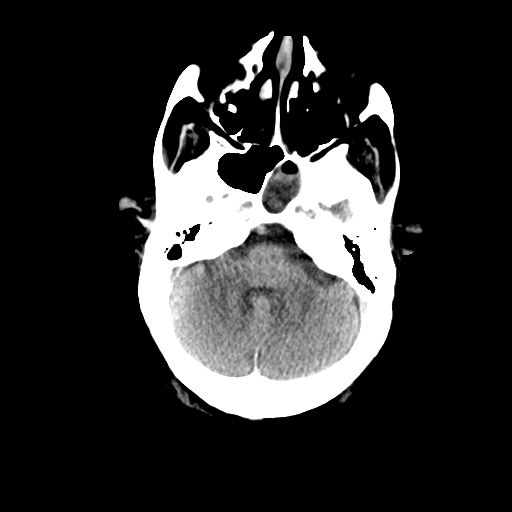
[im 13/33  brain]
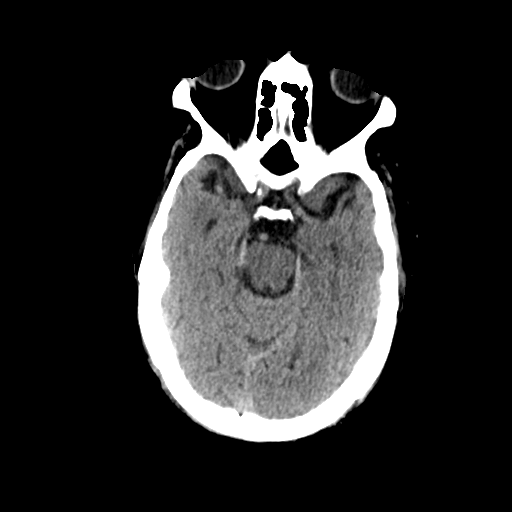
[im 17/33  brain]
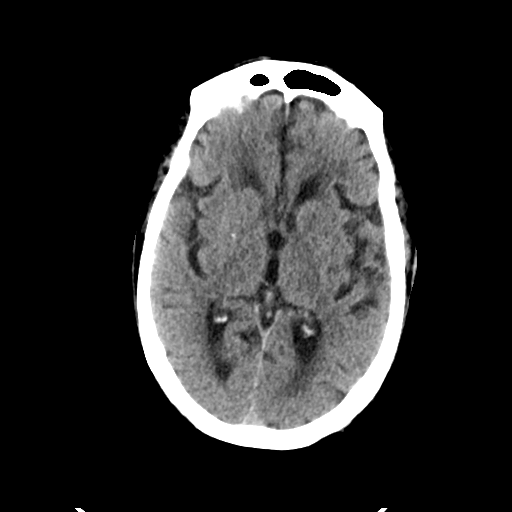
[im 17/33  bone]
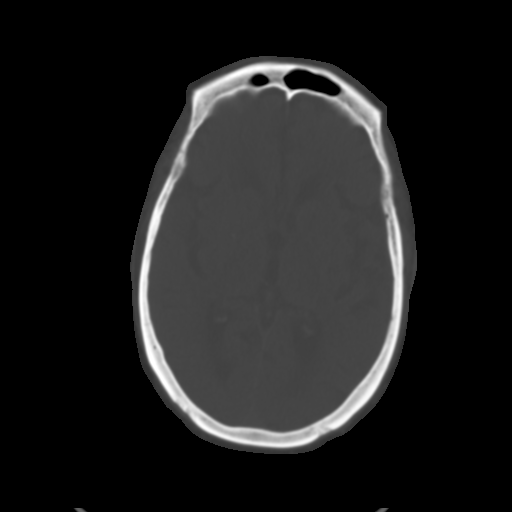
[im 20/33  brain]
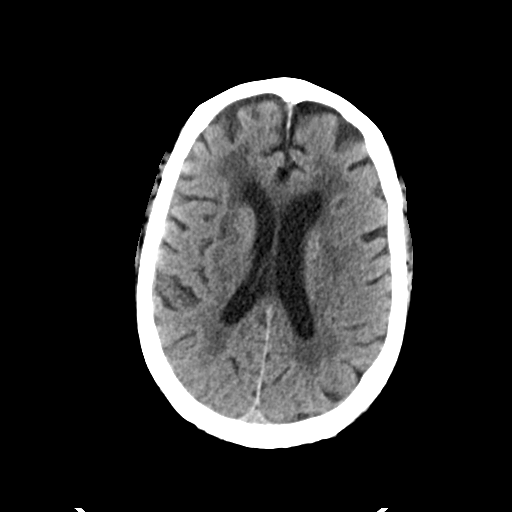
[im 24/33  brain]
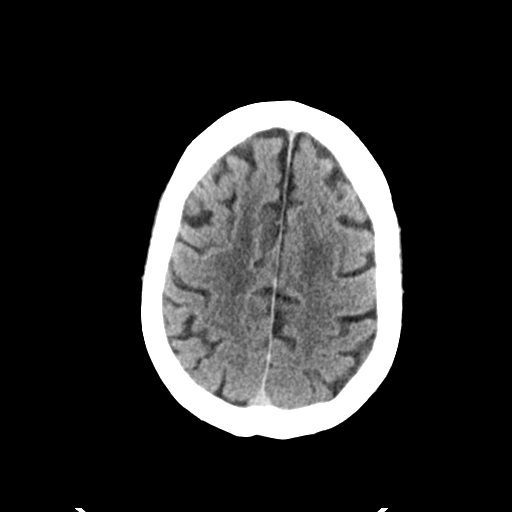
[im 27/33  brain]
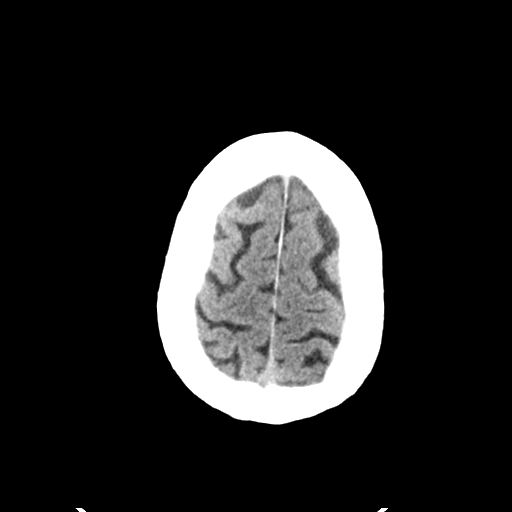
[im 30/33  brain]
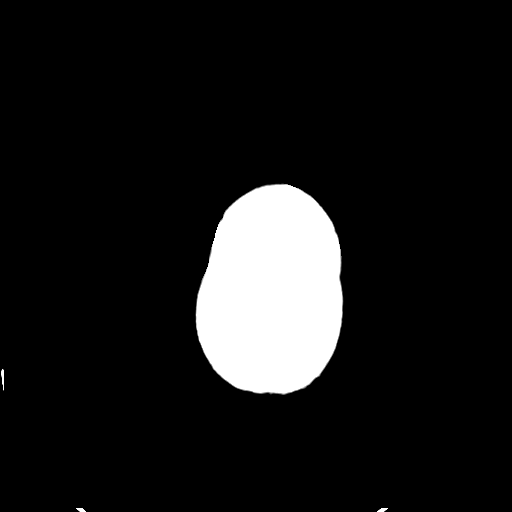
[im 30/33  bone]
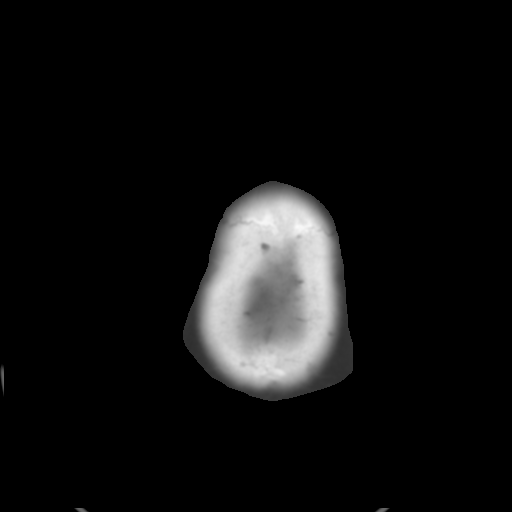

[Series 4: coronal soft tissue · coronal · 0.36mm/px · 3 of 76 slices shown]
[im 26/76  brain]
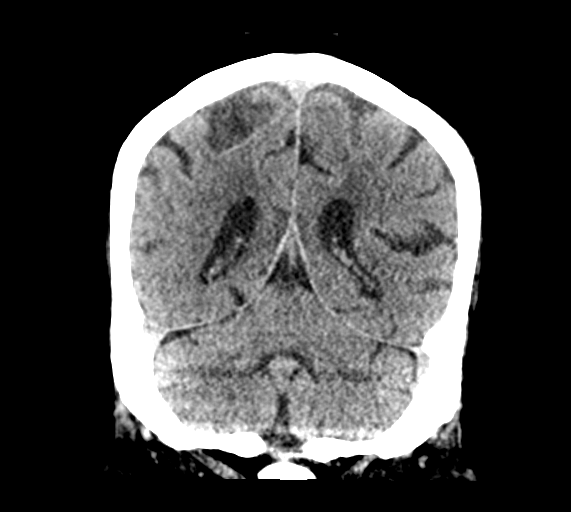
[im 34/76  brain]
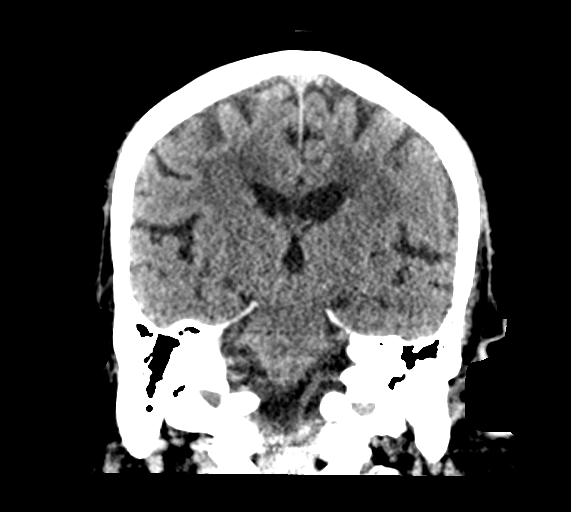
[im 42/76  brain]
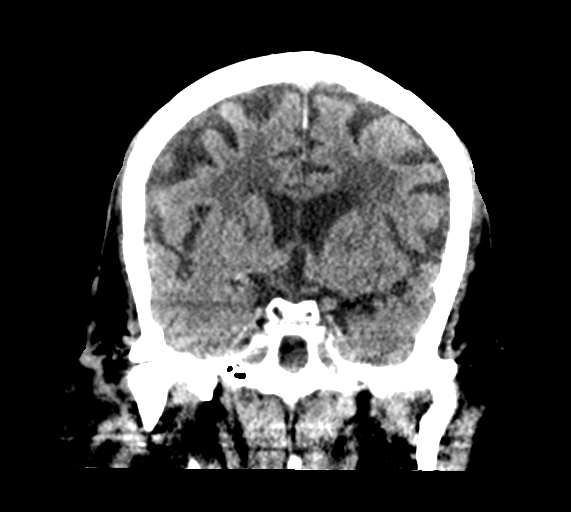

[Series 5: sagittal soft tissue · sagittal · 0.37mm/px · 3 of 67 slices shown]
[im 23/67  brain]
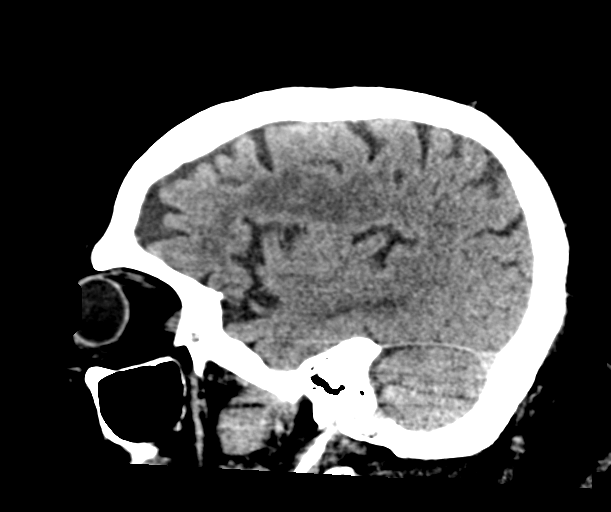
[im 34/67  brain]
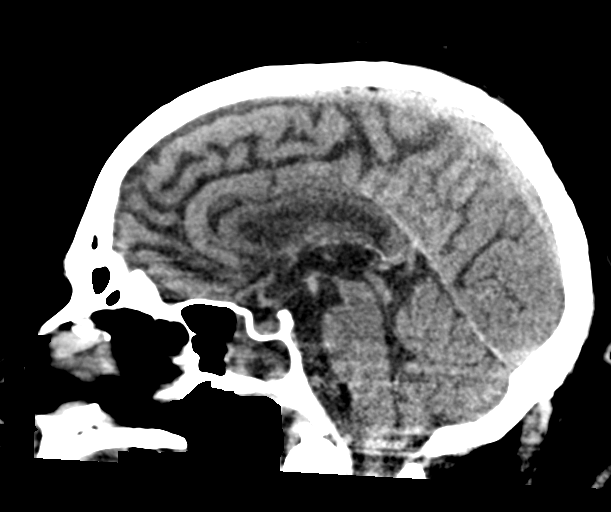
[im 45/67  brain]
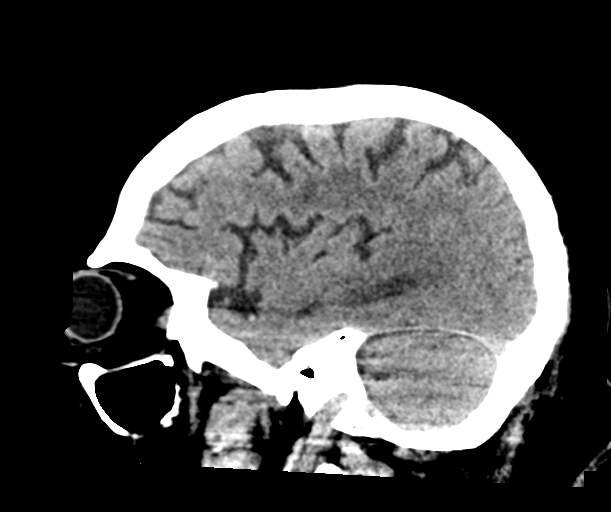

[15 of 47 positions shown; findings below may reference images not displayed]

FINDINGS: Brain: No acute intracranial hemorrhage. No midline shift or mass
effect. Gray-white differentiation relatively maintained.

Similar appearance of confluent hypodensity in the bilateral
periventricular white matter. Unremarkable appearance of the
ventricular system.

Vascular: Calcifications of the intracranial vasculature.

Skull: No acute fracture.  No aggressive bony lesions.

Sinuses/Orbits: Compared to the prior CT, there is now bilateral
proptosis with straightening of the optic nerve. Near total
opacification of the left sphenoid sinus.

Other: None
IMPRESSION: No CT evidence of acute intracranial abnormality.

Interval development of bilateral proptosis. This can be seen with
hyperthyroidism, and correlation with lab values is recommended.

Interval development of near total opacification left sphenoid
sinus, compatible with sinusitis.

## 2016-10-21 DIAGNOSIS — M12812 Other specific arthropathies, not elsewhere classified, left shoulder: Secondary | ICD-10-CM | POA: Diagnosis not present

## 2016-10-21 DIAGNOSIS — M12811 Other specific arthropathies, not elsewhere classified, right shoulder: Secondary | ICD-10-CM | POA: Diagnosis not present

## 2016-10-21 DIAGNOSIS — M19012 Primary osteoarthritis, left shoulder: Secondary | ICD-10-CM | POA: Diagnosis not present

## 2016-11-01 ENCOUNTER — Emergency Department (HOSPITAL_COMMUNITY)
Admission: EM | Admit: 2016-11-01 | Discharge: 2016-11-01 | Disposition: A | Discharge status: Home / Self Care | Payer: Medicare Other | Attending: Emergency Medicine | Admitting: Emergency Medicine

## 2016-11-01 ENCOUNTER — Encounter (HOSPITAL_COMMUNITY): Payer: Self-pay | Admitting: Emergency Medicine

## 2016-11-01 ENCOUNTER — Emergency Department (HOSPITAL_COMMUNITY): Payer: Medicare Other

## 2016-11-01 DIAGNOSIS — I129 Hypertensive chronic kidney disease with stage 1 through stage 4 chronic kidney disease, or unspecified chronic kidney disease: Secondary | ICD-10-CM | POA: Diagnosis not present

## 2016-11-01 DIAGNOSIS — Z79899 Other long term (current) drug therapy: Secondary | ICD-10-CM | POA: Diagnosis not present

## 2016-11-01 DIAGNOSIS — G4489 Other headache syndrome: Secondary | ICD-10-CM | POA: Diagnosis not present

## 2016-11-01 DIAGNOSIS — J449 Chronic obstructive pulmonary disease, unspecified: Secondary | ICD-10-CM | POA: Insufficient documentation

## 2016-11-01 DIAGNOSIS — I2510 Atherosclerotic heart disease of native coronary artery without angina pectoris: Secondary | ICD-10-CM | POA: Diagnosis not present

## 2016-11-01 DIAGNOSIS — R279 Unspecified lack of coordination: Secondary | ICD-10-CM | POA: Diagnosis not present

## 2016-11-01 DIAGNOSIS — Z87891 Personal history of nicotine dependence: Secondary | ICD-10-CM | POA: Diagnosis not present

## 2016-11-01 DIAGNOSIS — I10 Essential (primary) hypertension: Secondary | ICD-10-CM | POA: Diagnosis not present

## 2016-11-01 DIAGNOSIS — R030 Elevated blood-pressure reading, without diagnosis of hypertension: Secondary | ICD-10-CM | POA: Diagnosis not present

## 2016-11-01 DIAGNOSIS — Z7401 Bed confinement status: Secondary | ICD-10-CM | POA: Diagnosis not present

## 2016-11-01 DIAGNOSIS — N189 Chronic kidney disease, unspecified: Secondary | ICD-10-CM | POA: Diagnosis not present

## 2016-11-01 DIAGNOSIS — R600 Localized edema: Secondary | ICD-10-CM | POA: Diagnosis not present

## 2016-11-01 DIAGNOSIS — R51 Headache: Secondary | ICD-10-CM | POA: Diagnosis not present

## 2016-11-01 LAB — CBC WITH DIFFERENTIAL/PLATELET
BASOS ABS: 0 10*3/uL (ref 0.0–0.1)
Basophils Relative: 0 %
EOS PCT: 0 %
Eosinophils Absolute: 0 10*3/uL (ref 0.0–0.7)
HEMATOCRIT: 44.2 % (ref 39.0–52.0)
Hemoglobin: 14.7 g/dL (ref 13.0–17.0)
LYMPHS ABS: 1.2 10*3/uL (ref 0.7–4.0)
LYMPHS PCT: 11 %
MCH: 32 pg (ref 26.0–34.0)
MCHC: 33.3 g/dL (ref 30.0–36.0)
MCV: 96.3 fL (ref 78.0–100.0)
MONOS PCT: 7 %
Monocytes Absolute: 0.7 10*3/uL (ref 0.1–1.0)
NEUTROS ABS: 9.2 10*3/uL — AB (ref 1.7–7.7)
Neutrophils Relative %: 82 %
PLATELETS: 163 10*3/uL (ref 150–400)
RBC: 4.59 MIL/uL (ref 4.22–5.81)
RDW: 16.2 % — AB (ref 11.5–15.5)
WBC: 11.1 10*3/uL — ABNORMAL HIGH (ref 4.0–10.5)

## 2016-11-01 LAB — URINALYSIS, ROUTINE W REFLEX MICROSCOPIC
BILIRUBIN URINE: NEGATIVE
Glucose, UA: NEGATIVE mg/dL
KETONES UR: NEGATIVE mg/dL
Nitrite: NEGATIVE
Protein, ur: NEGATIVE mg/dL
SPECIFIC GRAVITY, URINE: 1.013 (ref 1.005–1.030)
pH: 7 (ref 5.0–8.0)

## 2016-11-01 LAB — BASIC METABOLIC PANEL
ANION GAP: 7 (ref 5–15)
BUN: 23 mg/dL — AB (ref 6–20)
CALCIUM: 9.4 mg/dL (ref 8.9–10.3)
CO2: 26 mmol/L (ref 22–32)
Chloride: 102 mmol/L (ref 101–111)
Creatinine, Ser: 0.63 mg/dL (ref 0.61–1.24)
GFR calc Af Amer: >60 mL/min (ref >60)
GFR calc non Af Amer: >60 mL/min (ref >60)
GLUCOSE: 124 mg/dL — AB (ref 65–99)
Potassium: 4.1 mmol/L (ref 3.5–5.1)
Sodium: 135 mmol/L (ref 135–145)

## 2016-11-01 MED ORDER — ACETAMINOPHEN 500 MG PO TABS
1000.0000 mg | ORAL_TABLET | Freq: Once | ORAL | Status: AC
  Administered 2016-11-01: 1000 mg via ORAL
  Filled 2016-11-01: qty 2

## 2016-11-01 NOTE — ED Notes (Signed)
[facility] notified of pt's need for transport back home. Pt awaiting transport by [facility] at this time. Pt notified.

## 2016-11-01 NOTE — ED Provider Notes (Signed)
[facility] DEPT Provider Note   CSN: [id number] Arrival date & time: 11/01/16  1122  By signing my name below, I, Jeanell Sparrow, attest that this documentation has been prepared under the direction and in the presence of Merrily Pew, MD. Electronically Signed: Jeanell Sparrow, Scribe. 11/01/2016. 12:28 PM.  History   Chief Complaint Chief Complaint  Patient presents with  . Hypertension   The history is provided by the patient and the spouse. No language interpreter was used.   HPI Comments: Bryce Berry is a 76 y.o. male with a PMHx of HTN who presents to the Emergency Department complaining of HTN that started this morning. He woke up this morning with a high blood pressure reading, so he called EMS. He has been having high readings prior to today, "but nothing over 200". His BP in the ED today was 172/118. He is currently on 10mg  Lisinopril. She reports associated intermittent moderate headache (onset last week, worse in the morning better through the day, no associated neurologic symptoms). He has a hx of RUE weakness from arthritis. He denies any chest pain or other complaints.     PCP: Glenda Chroman, MD  Past Medical History:  Diagnosis Date  . Atrial fibrillation ([place])   . Atrial flutter ([place])    Possible history in the past - details unclear  . Bilateral pulmonary embolism ([place])    a. 10/2012 - V:Q scan high prob for Bilat PE in setting of dyspnea/hemoptysis-->anticoagulation started.  Marland Kitchen BPH (benign prostatic hyperplasia)   . CAD (coronary artery disease)    Stent PCI in [place] and also [place] in the past - details not clear  . Chronic anticoagulation   . Chronic pain   . 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 COPD (chronic obstructive pulmonary disease) ([place])   . Depression   . Dyslipidemia   . Essential hypertension   . GERD (gastroesophageal reflux disease)   . GI bleed   . Gout   . Hyperlipidemia    Mixed  . Irritable bowel   .  Osteoarthritis   . Osteoporosis   . Peripheral arterial disease ([place])    Dopplers Dr.Vyas office October, 2010, apparent occlusion anterior tibial arteries bilaterally., findings suggest hemodynamically significant stenosis of the proximal right provisional femoral artery and the mid left superficial artery.  Ankle-brachial indices indicate moderate stenosis within both lower extremities  . Posttraumatic stress disorder   . Recurrent UTI (urinary tract infection)   . Right leg DVT ([place])    a. 10/2012 - subocclusive DVT R popliteal vein.  . Wheelchair bound     Patient Active Problem List   Diagnosis Date Noted  . Mild dementia 09/01/2015  . Generalized weakness 07/30/2015  . Acute encephalopathy 07/29/2015  . UTI (lower urinary tract infection) 07/29/2015  . PE (pulmonary embolism) 01/03/2015  . Pulmonary embolism ([place]) 01/03/2015  . Acute blood loss anemia 12/24/2014  . Clostridium difficile colitis 12/22/2014  . Diverticulitis of large intestine without perforation or abscess with bleeding   . Diverticulitis 12/21/2014  . Melena 12/21/2014  . Diarrhea 12/16/2014  . Hypotension 12/16/2014  . HX: anticoagulation 11/02/2013  . Right leg DVT ([place])   . Bilateral pulmonary embolism ([place])   . Ejection fraction   . Anemia 03/01/2012  . History of lower GI bleeding 11/01/2011  . Gastroparesis 07/13/2011  . Constipation 07/13/2011  . Atrial fibrillation ([place])   . Renal insufficiency   . COPD (chronic obstructive pulmonary disease) ([place])   .  Posttraumatic stress disorder   . GERD (gastroesophageal reflux disease)   . Hypertension   . Dyslipidemia   . CAD (coronary artery disease)   . Atrial flutter ([place])   . Gout   . Peripheral arterial disease ([place])   . Chronic kidney disease   . Atrial flutter ([place]) 06/20/2009  . Peripheral vascular disease ([place]) 06/20/2009  . Hyperlipidemia 05/09/2009  . Essential hypertension 05/09/2009  . OTH COMPLICATIONS DUE INTERNAL JOINT PROSTHESIS  04/01/2009  . 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 SHOULDER PAIN 09/25/2007  . HIP PAIN 09/25/2007  . BACK PAIN 09/25/2007  . IMPINGEMENT SYNDROME 09/25/2007    Past Surgical History:  Procedure Laterality Date  . [place]  . CATARACT EXTRACTION    . COLON RESECTION    . COLONOSCOPY N/A 03/13/2013   Procedure: COLONOSCOPY;  Surgeon: Rogene Houston, MD;  Location: [facility];  Service: Endoscopy;  Laterality: N/A;  730  . COLONOSCOPY N/A 12/22/2014   Procedure: COLONOSCOPY;  Surgeon: Rogene Houston, MD;  Location: [facility];  Service: Endoscopy;  Laterality: N/A;  . CORONARY ANGIOPLASTY WITH STENT PLACEMENT     x 4  . GIVENS CAPSULE STUDY N/A 12/24/2014   Procedure: GIVENS CAPSULE STUDY;  Surgeon: Rogene Houston, MD;  Location: [facility];  Service: Endoscopy;  Laterality: N/A;  . KNEE ARTHROPLASTY     Right  . PROSTATE SURGERY    . TOTAL HIP ARTHROPLASTY     Right-complicated hx  . TOTAL KNEE ARTHROPLASTY     Left       Home Medications    Prior to Admission medications   Medication Sig Start Date End Date Taking? Authorizing Provider  allopurinol (ZYLOPRIM) 100 MG tablet Take 100 mg by mouth every evening.    Yes Historical Provider, MD  ANORO ELLIPTA 62.5-25 MCG/INH AEPB Inhale 1 puff into the lungs daily. 04/19/16  Yes Historical Provider, MD  ascorbic acid (VITAMIN C) 500 MG tablet Take 500 mg by mouth daily.    Yes Historical Provider, MD  atorvastatin (LIPITOR) 80 MG tablet Take 80 mg by mouth every evening.    Yes Historical Provider, MD  calcium-vitamin D (OSCAL WITH D) 500-200 MG-UNIT per tablet Take 1 tablet by mouth daily.    Yes Historical Provider, MD  cilostazol (PLETAL) 100 MG tablet Take 1 tablet by mouth daily. 07/07/15  Yes Historical Provider, MD  COLCRYS 0.6 MG tablet Take 1 tablet by mouth daily as needed (gout pain).  07/07/15  Yes Historical Provider, MD  diclofenac sodium (VOLTAREN) 1 % GEL Apply 1  application topically daily. Applied to feet 05/20/15  Yes Historical Provider, MD  digoxin (LANOXIN) 0.125 MG tablet Take 1 tablet (0.125 mg total) by mouth daily. 04/15/16  Yes Satira Sark, MD  diltiazem (CARDIZEM CD) 120 MG 24 hr capsule Take 1 capsule (120 mg total) by mouth daily. As directed based off blood pressure readings 04/15/16  Yes Satira Sark, MD  fenofibrate (TRICOR) 145 MG tablet Take 1 tablet (145 mg total) by mouth daily. Patient taking differently: Take 145 mg by mouth every evening.  08/16/11  Yes Ezra Sites, MD  fentaNYL (DURAGESIC - DOSED MCG/HR) 75 MCG/HR Place 75 mcg onto the skin every 3 (three) days. Change patch every 3 days 08/27/15  Yes Historical Provider, MD  ferrous sulfate 325 (65 FE) MG tablet Take 325 mg by mouth daily with breakfast.   Yes  Historical Provider, MD  finasteride (PROSCAR) 5 MG tablet Take 5 mg by mouth daily.     Yes Historical Provider, MD  fluticasone (FLONASE) 50 MCG/ACT nasal spray Place 2 sprays into both nostrils daily.   Yes Historical Provider, MD  furosemide (LASIX) 40 MG tablet Take 40 mg by mouth every evening. *May take 80mg  as needed for swelling   Yes Historical Provider, MD  HYDROcodone-acetaminophen (NORCO) 7.5-325 MG tablet Take 1 tablet by mouth every 4 (four) hours as needed for moderate pain. 01/26/16  Yes Carole Civil, MD  lidocaine (LIDODERM) 5 % Place 1 patch onto the skin daily as needed (for pain). Every 12 hours as needed 08/29/15  Yes Historical Provider, MD  magnesium oxide (MAG-OX) 400 (241.3 MG) MG tablet Take 1 tablet (400 mg total) by mouth 2 (two) times daily. 01/06/15  Yes Reyne Dumas, MD  metoprolol (LOPRESSOR) 100 MG tablet Take 1 tablet (100 mg total) by mouth 2 (two) times daily. 02/07/12  Yes Donney Dice, PA-C  Multiple Vitamin (MULTIVITAMIN) tablet Take 1 tablet by mouth daily.     Yes Historical Provider, MD  nitroGLYCERIN (NITROSTAT) 0.4 MG SL tablet Place 1 tablet (0.4 mg total) under the  tongue every 5 (five) minutes x 3 doses as needed. 01/07/15  Yes Carlena Bjornstad, MD  Omega-3 Fatty Acids (FISH OIL) 1000 MG CAPS Take 2 capsules by mouth daily.    Yes Historical Provider, MD  pantoprazole (PROTONIX) 40 MG tablet Take 40 mg by mouth 2 (two) times daily.    Yes Historical Provider, MD  potassium chloride SA (K-DUR,KLOR-CON) 20 MEQ tablet Take 2 tablets (40 mEq total) by mouth daily. Patient taking differently: Take 20 mEq by mouth daily. *Give additional tablet if Lasix is increased due to swelling 01/05/15  Yes Reyne Dumas, MD  PROAIR HFA 108 (90 Base) MCG/ACT inhaler Inhale 2 puffs into the lungs 2 (two) times daily as needed for wheezing or shortness of breath. 2 puffs twice daily 08/20/15  Yes Historical Provider, MD  psyllium (METAMUCIL SMOOTH TEXTURE) 28 % packet Take 1 packet by mouth at bedtime. Patient taking differently: Take 1 packet by mouth at bedtime as needed (bowels).  03/13/13  Yes Rogene Houston, MD  rivaroxaban (XARELTO) 20 MG TABS tablet Take 1 tablet (20 mg total) by mouth daily. 01/27/15  Yes Reyne Dumas, MD  rivastigmine (EXELON) 9.5 mg/24hr Place 1 patch (9.5 mg total) onto the skin daily. 06/04/16  Yes Cameron Sprang, MD  tamsulosin (FLOMAX) 0.4 MG CAPS capsule Take 0.4 mg by mouth daily after supper.   Yes Historical Provider, MD    Family History Family History  Problem Relation Age of Onset  . Diabetes Daughter   . Hypertension Daughter   . Obesity Daughter   . Cancer Maternal Grandmother     Social History Social History  Substance Use Topics  . Smoking status: Former Smoker    Packs/day: 1.00    Years: 30.00    Types: Cigarettes    Start date: 08/31/1956    Quit date: 08/17/2003  . Smokeless tobacco: Never Used     Comment: Patient smoked a pack a day for about 30 years  . Alcohol use No     Allergies   Omeprazole; Other; Penicillins; Sulfonamide derivatives; and Iodine   Review of Systems Review of Systems  Cardiovascular: Negative  for chest pain.  Neurological: Positive for headaches.  All other systems reviewed and are negative.    Physical  Exam Updated Vital Signs BP (!) 172/118 (BP Location: Left Wrist)   Pulse 65   Temp 99 F (37.2 C) (Oral)   Resp 18   Ht 6\' 2"  (1.88 m)   Wt 300 lb (136.1 kg)   SpO2 97%   BMI 38.52 kg/m   Physical Exam  Constitutional: He appears well-developed and well-nourished. No distress.  HENT:  Head: Normocephalic and atraumatic.  Eyes: Conjunctivae are normal.  Neck: Neck supple.  Cardiovascular: Normal rate.   Pulmonary/Chest: Effort normal.  Abdominal: Soft.  Musculoskeletal: Normal range of motion.  Mild periorbital edema. Lower extremity venous stasis changes.   Neurological: He is alert.  Cranial nerves 2-12 intact. RUE has 4/5 strength. Ulnar deviation of fingers of both hands.   Skin: Skin is warm and dry.  Psychiatric: He has a normal mood and affect.  Nursing note and vitals reviewed.    ED Treatments / Results  DIAGNOSTIC STUDIES: Oxygen Saturation is 97% on RA, normal by my interpretation.    COORDINATION OF CARE: 12:32 PM- Pt advised of plan for treatment and pt agrees.  Labs (all labs ordered are listed, but only abnormal results are displayed) Labs Reviewed  CBC WITH DIFFERENTIAL/PLATELET - Abnormal; Notable for the following:       Result Value   WBC 11.1 (*)    RDW 16.2 (*)    Neutro Abs 9.2 (*)    All other components within normal limits  BASIC METABOLIC PANEL - Abnormal; Notable for the following:    Glucose, Bld 124 (*)    BUN 23 (*)    All other components within normal limits  URINALYSIS, ROUTINE W REFLEX MICROSCOPIC - Abnormal; Notable for the following:    APPearance CLOUDY (*)    Hgb urine dipstick SMALL (*)    Leukocytes, UA LARGE (*)    Bacteria, UA RARE (*)    All other components within normal limits  GASTROINTESTINAL PANEL BY PCR, STOOL (REPLACES STOOL CULTURE)    EKG  EKG Interpretation  Date/Time:  Monday November 01 2016 12:52:24 EDT Ventricular Rate:  75 PR Interval:    QRS Duration: 105 QT Interval:  305 QTC Calculation: 343 R Axis:   32 Text Interpretation:  Atrial fibrillation Low voltage, extremity and precordial leads Probable anteroseptal infarct, old Nonspecific T abnormalities, lateral leads No significant change since last tracing Confirmed by [facility] MD, Corene Cornea [phone]) on 11/01/2016 1:35:29 PM       Radiology Ct Head Wo Contrast  Result Date: 11/01/2016 CLINICAL DATA:  Left-sided headaches that radiate to the right, hypertension. EXAM: CT HEAD WITHOUT CONTRAST TECHNIQUE: Contiguous axial images were obtained from the base of the skull through the vertex without intravenous contrast. COMPARISON:  08/05/2016. FINDINGS: Brain: No evidence of an acute infarct, acute hemorrhage, mass lesion, mass effect or hydrocephalus. Atrophy. Periventricular low attenuation. Vascular: No hyperdense vessel or unexpected calcification. Skull: Normal. Negative for fracture or focal lesion. Sinuses/Orbits: No acute finding. Other: None. IMPRESSION: 1. No acute intracranial abnormality. 2. Atrophy and chronic microvascular white matter ischemic changes. Electronically Signed   By: Lorin Picket M.D.   On: 11/01/2016 14:36    Procedures Procedures (including critical care time)  Medications Ordered in ED Medications - No data to display   Initial Impression / Assessment and Plan / ED Course  I have reviewed the triage vital signs and the nursing notes.  Pertinent labs & imaging results that were available during my care of the patient were reviewed by me and  considered in my medical decision making (see chart for details).     Blood pressures reasonable in ER. Improved headache and BP's without intervention. No e/o end organ damage on labs/ecg/ct. Plan for continued pcp follow up for HTN management.   Final Clinical Impressions(s) / ED Diagnoses   Final diagnoses:  Hypertension, unspecified type     New Prescriptions New Prescriptions   No medications on file   I personally performed the services described in this documentation, which was scribed in my presence. The recorded information has been reviewed and is accurate.     Merrily Pew, MD 11/01/16 [phone]

## 2016-11-01 NOTE — ED Triage Notes (Signed)
Pt's wife checked bp this morning and was elevated so she called ems for evaluation.  Pt c/o headache.

## 2016-11-05 ENCOUNTER — Ambulatory Visit (INDEPENDENT_AMBULATORY_CARE_PROVIDER_SITE_OTHER): Payer: Medicare Other | Admitting: Internal Medicine

## 2016-11-05 ENCOUNTER — Encounter (INDEPENDENT_AMBULATORY_CARE_PROVIDER_SITE_OTHER): Payer: Self-pay | Admitting: Internal Medicine

## 2016-11-05 VITALS — BP 120/80 | HR 68 | Temp 98.6°F | Ht 74.0 in | Wt 300.0 lb

## 2016-11-05 DIAGNOSIS — R197 Diarrhea, unspecified: Secondary | ICD-10-CM

## 2016-11-05 MED ORDER — METRONIDAZOLE 500 MG PO TABS: 500.0000 mg | ORAL_TABLET | Freq: Two times a day (BID) | ORAL | 0 refills | Status: DC

## 2016-11-05 NOTE — Progress Notes (Signed)
Subjective:    Patient ID: Bryce Berry, male    DOB: [birth year], 76 y.o.   MRN: [id number] He is w/c bound   HPI Here today for f/u. He was last seen in May 2017 for chronic diarrhea.  GI pathogen was negative at that OV. Hxof C-diff in May of 2016.    He is having 4-5 stools a day. Wife states his stools are very liquid. Taking Imodium twice a day.  No recent antibiotics.  His appetite is good. (Weight is stated). When he eats he will have diarrhea. He is taking Cholcrys 0.6mg  daily for a gout flare now.  Per Dr. Olevia Perches record 11/08/2011 underwent a sigmoid resection in 1992 for sigmoid diverticulitis.   Hx of GI bleeding 2016.  12/30/2014 Colonoscopy: hematochezia:  Fresh blood noted in terminal ileum. Blood and clots noted throughout the colon but no bleeding lesion identified. Scattered diverticula throughout the colon. Review of Systems Past Medical History:  Diagnosis Date  . Atrial fibrillation ([place])   . Atrial flutter ([place])    Possible history in the past - details unclear  . Bilateral pulmonary embolism ([place])    a. 10/2012 - V:Q scan high prob for Bilat PE in setting of dyspnea/hemoptysis-->anticoagulation started.  Marland Kitchen BPH (benign prostatic hyperplasia)   . CAD (coronary artery disease)    Stent PCI in [place] and also [place] in the past - details not clear  . Chronic anticoagulation   . Chronic pain   . 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 COPD (chronic obstructive pulmonary disease) ([place])   . Depression   . Dyslipidemia   . Essential hypertension   . GERD (gastroesophageal reflux disease)   . GI bleed   . Gout   . Hyperlipidemia    Mixed  . Irritable bowel   . Osteoarthritis   . Osteoporosis   . Peripheral arterial disease ([place])    Dopplers Dr.Vyas office October, 2010, apparent occlusion anterior tibial arteries bilaterally., findings suggest hemodynamically significant stenosis of the proximal right provisional femoral artery and the mid  left superficial artery.  Ankle-brachial indices indicate moderate stenosis within both lower extremities  . Posttraumatic stress disorder   . Recurrent UTI (urinary tract infection)   . Right leg DVT ([place])    a. 10/2012 - subocclusive DVT R popliteal vein.  . Wheelchair bound     Past Surgical History:  Procedure Laterality Date  . [place]  . CATARACT EXTRACTION    . COLON RESECTION    . COLONOSCOPY N/A 03/13/2013   Procedure: COLONOSCOPY;  Surgeon: Rogene Houston, MD;  [facility];  Service: Endoscopy;  Laterality: N/A;  730  . COLONOSCOPY N/A 12/22/2014   Procedure: COLONOSCOPY;  Surgeon: Rogene Houston, MD;  Location[facility];  Service: Endoscopy;  Laterality: N/A;  . CORONARY ANGIOPLASTY WITH STENT PLACEMENT     x 4  . GIVENS CAPSULE STUDY N/A 12/24/2014   Procedure: GIVENS CAPSULE STUDY;  Surgeon: Rogene Houston, MD;  Location[facility];  Service: Endoscopy;  Laterality: N/A;  . KNEE ARTHROPLASTY     Right  . PROSTATE SURGERY    . TOTAL HIP ARTHROPLASTY     Right-complicated hx  . TOTAL KNEE ARTHROPLASTY     Left    Allergies  Allergen Reactions  . Omeprazole Other (See Comments)    Reaction unknown-possible upset stomach  . Other     Paper  tape  . Penicillins Other (See Comments)    Has patient had a PCN reaction causing immediate rash, facial/tongue/throat swelling, SOB or lightheadedness with hypotension: No Has patient had a PCN reaction causing severe rash involving mucus membranes or skin necrosis: No Has patient had a PCN reaction that required hospitalization No Has patient had a PCN reaction occurring within the last 10 years: No If all of the above answers are "NO", then may proceed with Cephalosporin use.    Rapid heart rate; flushed  . Sulfonamide Derivatives Other (See Comments)    Rapid heart rate; flushed  . Iodine Rash    Current Outpatient Prescriptions on File Prior to Visit  Medication Sig  Dispense Refill  . allopurinol (ZYLOPRIM) 100 MG tablet Take 100 mg by mouth every evening.     Jearl Klinefelter ELLIPTA 62.5-25 MCG/INH AEPB Inhale 1 puff into the lungs daily.    Marland Kitchen ascorbic acid (VITAMIN C) 500 MG tablet Take 500 mg by mouth daily.     Marland Kitchen atorvastatin (LIPITOR) 80 MG tablet Take 80 mg by mouth every evening.     . calcium-vitamin D (OSCAL WITH D) 500-200 MG-UNIT per tablet Take 1 tablet by mouth daily.     . cilostazol (PLETAL) 100 MG tablet Take 1 tablet by mouth daily.    Marland Kitchen COLCRYS 0.6 MG tablet Take 1 tablet by mouth daily as needed (gout pain).     Marland Kitchen diclofenac sodium (VOLTAREN) 1 % GEL Apply 1 application topically daily. Applied to feet    . digoxin (LANOXIN) 0.125 MG tablet Take 1 tablet (0.125 mg total) by mouth daily. 60 tablet 3  . diltiazem (CARDIZEM CD) 120 MG 24 hr capsule Take 1 capsule (120 mg total) by mouth daily. As directed based off blood pressure readings 30 capsule 6  . fenofibrate (TRICOR) 145 MG tablet Take 1 tablet (145 mg total) by mouth daily. (Patient taking differently: Take 145 mg by mouth every evening. ) 30 tablet 6  . fentaNYL (DURAGESIC - DOSED MCG/HR) 75 MCG/HR Place 75 mcg onto the skin every 3 (three) days. Change patch every 3 days    . ferrous sulfate 325 (65 FE) MG tablet Take 325 mg by mouth daily with breakfast.    . finasteride (PROSCAR) 5 MG tablet Take 5 mg by mouth daily.      . fluticasone (FLONASE) 50 MCG/ACT nasal spray Place 2 sprays into both nostrils daily.    . furosemide (LASIX) 40 MG tablet Take 40 mg by mouth every evening. *May take 80mg  as needed for swelling    . HYDROcodone-acetaminophen (NORCO) 7.5-325 MG tablet Take 1 tablet by mouth every 4 (four) hours as needed for moderate pain. 42 tablet 0  . lidocaine (LIDODERM) 5 % Place 1 patch onto the skin daily as needed (for pain). Every 12 hours as needed    . magnesium oxide (MAG-OX) 400 (241.3 MG) MG tablet Take 1 tablet (400 mg total) by mouth 2 (two) times daily. 60 tablet 2    . metoprolol (LOPRESSOR) 100 MG tablet Take 1 tablet (100 mg total) by mouth 2 (two) times daily. 180 tablet 3  . Multiple Vitamin (MULTIVITAMIN) tablet Take 1 tablet by mouth daily.      . nitroGLYCERIN (NITROSTAT) 0.4 MG SL tablet Place 1 tablet (0.4 mg total) under the tongue every 5 (five) minutes x 3 doses as needed. 25 tablet 3  . Omega-3 Fatty Acids (FISH OIL) 1000 MG CAPS Take 2 capsules by mouth daily.     Marland Kitchen  pantoprazole (PROTONIX) 40 MG tablet Take 40 mg by mouth 2 (two) times daily.     . potassium chloride SA (K-DUR,KLOR-CON) 20 MEQ tablet Take 2 tablets (40 mEq total) by mouth daily. (Patient taking differently: Take 20 mEq by mouth daily. *Give additional tablet if Lasix is increased due to swelling) 30 tablet 0  . PROAIR HFA 108 (90 Base) MCG/ACT inhaler Inhale 2 puffs into the lungs 2 (two) times daily as needed for wheezing or shortness of breath. 2 puffs twice daily    . rivaroxaban (XARELTO) 20 MG TABS tablet Take 1 tablet (20 mg total) by mouth daily. 30 tablet 10  . rivastigmine (EXELON) 9.5 mg/24hr Place 1 patch (9.5 mg total) onto the skin daily. 30 patch 12  . tamsulosin (FLOMAX) 0.4 MG CAPS capsule Take 0.4 mg by mouth daily after supper.    . psyllium (METAMUCIL SMOOTH TEXTURE) 28 % packet Take 1 packet by mouth at bedtime. (Patient not taking: Reported on 11/05/2016)    . [DISCONTINUED] solifenacin (VESICARE) 10 MG tablet Take 10 mg by mouth daily.      No current facility-administered medications on file prior to visit.        Objective:   Physical Exam Blood pressure 120/80, pulse 68, temperature 98.6 F (37 C), height 6\' 2"  (1.88 m), weight 300 lb (136.1 kg). Alert and oriented. Skin warm and dry. Oral mucosa is moist.   . Sclera anicteric, conjunctivae is pink. Thyroid not enlarged. No cervical lymphadenopathy. Lungs clear. Heart regular rate and rhythm.  Abdomen is soft. Bowel sounds are positive. No hepatomegaly. No abdominal masses felt. No tenderness. Patient  is w/ch bound.        Assessment & Plan:  Chronic diarrhea. GI pathogen.  Am going to call in Rx for Flagyl to start after the GI pathogen is obtained. Wife was very insulting to me in office. She wanted a colonoscopy for her husband. I advised her that he had one less than 2 yrs ago.  She was not happy with me ordering a GI pathogen for his diarrhea.  She wanted to know if there was another GI in town and I did advise her. She has an OV in august with Dr. Laural Golden.

## 2016-11-11 DIAGNOSIS — E7800 Pure hypercholesterolemia, unspecified: Secondary | ICD-10-CM | POA: Diagnosis not present

## 2016-11-11 DIAGNOSIS — G4730 Sleep apnea, unspecified: Secondary | ICD-10-CM | POA: Diagnosis not present

## 2016-11-11 DIAGNOSIS — I10 Essential (primary) hypertension: Secondary | ICD-10-CM | POA: Diagnosis not present

## 2016-11-11 DIAGNOSIS — J449 Chronic obstructive pulmonary disease, unspecified: Secondary | ICD-10-CM | POA: Diagnosis not present

## 2016-11-11 DIAGNOSIS — I739 Peripheral vascular disease, unspecified: Secondary | ICD-10-CM | POA: Diagnosis not present

## 2016-11-11 DIAGNOSIS — I2699 Other pulmonary embolism without acute cor pulmonale: Secondary | ICD-10-CM | POA: Diagnosis not present

## 2016-11-11 DIAGNOSIS — I4891 Unspecified atrial fibrillation: Secondary | ICD-10-CM | POA: Diagnosis not present

## 2016-11-11 DIAGNOSIS — I82401 Acute embolism and thrombosis of unspecified deep veins of right lower extremity: Secondary | ICD-10-CM | POA: Diagnosis not present

## 2016-11-11 DIAGNOSIS — I2510 Atherosclerotic heart disease of native coronary artery without angina pectoris: Secondary | ICD-10-CM | POA: Diagnosis not present

## 2016-11-11 DIAGNOSIS — K5790 Diverticulosis of intestine, part unspecified, without perforation or abscess without bleeding: Secondary | ICD-10-CM | POA: Diagnosis not present

## 2016-11-11 DIAGNOSIS — Z299 Encounter for prophylactic measures, unspecified: Secondary | ICD-10-CM | POA: Diagnosis not present

## 2016-11-11 DIAGNOSIS — M1710 Unilateral primary osteoarthritis, unspecified knee: Secondary | ICD-10-CM | POA: Diagnosis not present

## 2016-11-16 DIAGNOSIS — R197 Diarrhea, unspecified: Secondary | ICD-10-CM | POA: Diagnosis not present

## 2016-11-19 LAB — GASTROINTESTINAL PATHOGEN PANEL PCR
C. DIFFICILE TOX A/B, PCR: DETECTED — AB
CAMPYLOBACTER, PCR: NOT DETECTED
Cryptosporidium, PCR: NOT DETECTED
E COLI (ETEC) LT/ST, PCR: NOT DETECTED
E COLI 0157, PCR: NOT DETECTED
E coli (STEC) stx1/stx2, PCR: NOT DETECTED
Giardia lamblia, PCR: NOT DETECTED
Norovirus, PCR: NOT DETECTED
Rotavirus A, PCR: NOT DETECTED
SALMONELLA, PCR: NOT DETECTED
Shigella, PCR: NOT DETECTED

## 2016-11-26 ENCOUNTER — Ambulatory Visit: Payer: Medicare Other | Admitting: Neurology

## 2016-12-07 ENCOUNTER — Telehealth (INDEPENDENT_AMBULATORY_CARE_PROVIDER_SITE_OTHER): Payer: Self-pay | Admitting: Internal Medicine

## 2016-12-07 NOTE — Telephone Encounter (Signed)
Patient's spouse called, stated that the medication that you gave the patient is not working and that you told her to call if it didn't.  She stated that she has called several time and has not heard anything.    [phone]

## 2016-12-08 ENCOUNTER — Telehealth (INDEPENDENT_AMBULATORY_CARE_PROVIDER_SITE_OTHER): Payer: Self-pay | Admitting: Internal Medicine

## 2016-12-08 DIAGNOSIS — A0472 Enterocolitis due to Clostridium difficile, not specified as recurrent: Secondary | ICD-10-CM

## 2016-12-08 MED ORDER — VANCOMYCIN HCL 125 MG PO CAPS: 125.0000 mg | ORAL_CAPSULE | Freq: Four times a day (QID) | ORAL | 0 refills | Status: DC

## 2016-12-08 NOTE — Telephone Encounter (Signed)
Wife states he is still having diarrhea. Am going to start Vancomycin 125mg  QID x 10 days.

## 2016-12-08 NOTE — Telephone Encounter (Signed)
I have spoken with patient. Rx for Vancomycin 125mg  qid sent to his pharmacy

## 2016-12-16 ENCOUNTER — Ambulatory Visit: Payer: Medicare Other | Admitting: Pulmonary Disease

## 2016-12-18 DIAGNOSIS — E871 Hypo-osmolality and hyponatremia: Secondary | ICD-10-CM | POA: Diagnosis not present

## 2016-12-18 DIAGNOSIS — J441 Chronic obstructive pulmonary disease with (acute) exacerbation: Secondary | ICD-10-CM | POA: Diagnosis not present

## 2016-12-18 DIAGNOSIS — Z7951 Long term (current) use of inhaled steroids: Secondary | ICD-10-CM | POA: Diagnosis not present

## 2016-12-18 DIAGNOSIS — M1990 Unspecified osteoarthritis, unspecified site: Secondary | ICD-10-CM | POA: Diagnosis not present

## 2016-12-18 DIAGNOSIS — I10 Essential (primary) hypertension: Secondary | ICD-10-CM | POA: Diagnosis not present

## 2016-12-18 DIAGNOSIS — I2510 Atherosclerotic heart disease of native coronary artery without angina pectoris: Secondary | ICD-10-CM | POA: Diagnosis not present

## 2016-12-18 DIAGNOSIS — E785 Hyperlipidemia, unspecified: Secondary | ICD-10-CM | POA: Diagnosis not present

## 2016-12-18 DIAGNOSIS — Z955 Presence of coronary angioplasty implant and graft: Secondary | ICD-10-CM | POA: Diagnosis not present

## 2016-12-18 DIAGNOSIS — F05 Delirium due to known physiological condition: Secondary | ICD-10-CM | POA: Diagnosis not present

## 2016-12-18 DIAGNOSIS — M549 Dorsalgia, unspecified: Secondary | ICD-10-CM | POA: Diagnosis not present

## 2016-12-18 DIAGNOSIS — N400 Enlarged prostate without lower urinary tract symptoms: Secondary | ICD-10-CM | POA: Diagnosis not present

## 2016-12-18 DIAGNOSIS — I4891 Unspecified atrial fibrillation: Secondary | ICD-10-CM | POA: Diagnosis not present

## 2016-12-18 DIAGNOSIS — J440 Chronic obstructive pulmonary disease with acute lower respiratory infection: Secondary | ICD-10-CM | POA: Diagnosis not present

## 2016-12-18 DIAGNOSIS — R29898 Other symptoms and signs involving the musculoskeletal system: Secondary | ICD-10-CM | POA: Diagnosis not present

## 2016-12-18 DIAGNOSIS — E876 Hypokalemia: Secondary | ICD-10-CM | POA: Diagnosis not present

## 2016-12-18 DIAGNOSIS — J101 Influenza due to other identified influenza virus with other respiratory manifestations: Secondary | ICD-10-CM | POA: Diagnosis not present

## 2016-12-18 DIAGNOSIS — Z7901 Long term (current) use of anticoagulants: Secondary | ICD-10-CM | POA: Diagnosis not present

## 2016-12-18 DIAGNOSIS — Z79899 Other long term (current) drug therapy: Secondary | ICD-10-CM | POA: Diagnosis not present

## 2016-12-18 DIAGNOSIS — B9689 Other specified bacterial agents as the cause of diseases classified elsewhere: Secondary | ICD-10-CM | POA: Diagnosis not present

## 2016-12-18 DIAGNOSIS — I499 Cardiac arrhythmia, unspecified: Secondary | ICD-10-CM | POA: Diagnosis not present

## 2016-12-18 DIAGNOSIS — Z86711 Personal history of pulmonary embolism: Secondary | ICD-10-CM | POA: Diagnosis not present

## 2016-12-18 DIAGNOSIS — F4310 Post-traumatic stress disorder, unspecified: Secondary | ICD-10-CM | POA: Diagnosis not present

## 2016-12-18 DIAGNOSIS — Z7401 Bed confinement status: Secondary | ICD-10-CM | POA: Diagnosis not present

## 2016-12-18 DIAGNOSIS — J209 Acute bronchitis, unspecified: Secondary | ICD-10-CM | POA: Diagnosis not present

## 2016-12-18 DIAGNOSIS — K449 Diaphragmatic hernia without obstruction or gangrene: Secondary | ICD-10-CM | POA: Diagnosis not present

## 2016-12-18 DIAGNOSIS — N390 Urinary tract infection, site not specified: Secondary | ICD-10-CM | POA: Diagnosis not present

## 2016-12-18 DIAGNOSIS — G8929 Other chronic pain: Secondary | ICD-10-CM | POA: Diagnosis not present

## 2016-12-18 DIAGNOSIS — Z96641 Presence of right artificial hip joint: Secondary | ICD-10-CM | POA: Diagnosis not present

## 2016-12-18 DIAGNOSIS — R05 Cough: Secondary | ICD-10-CM | POA: Diagnosis not present

## 2016-12-19 DIAGNOSIS — J101 Influenza due to other identified influenza virus with other respiratory manifestations: Secondary | ICD-10-CM | POA: Diagnosis not present

## 2016-12-19 DIAGNOSIS — F05 Delirium due to known physiological condition: Secondary | ICD-10-CM | POA: Diagnosis not present

## 2016-12-19 DIAGNOSIS — B9689 Other specified bacterial agents as the cause of diseases classified elsewhere: Secondary | ICD-10-CM | POA: Diagnosis not present

## 2016-12-19 DIAGNOSIS — N390 Urinary tract infection, site not specified: Secondary | ICD-10-CM | POA: Diagnosis not present

## 2016-12-20 DIAGNOSIS — Z7401 Bed confinement status: Secondary | ICD-10-CM | POA: Diagnosis not present

## 2016-12-20 DIAGNOSIS — N390 Urinary tract infection, site not specified: Secondary | ICD-10-CM | POA: Diagnosis not present

## 2016-12-20 DIAGNOSIS — F05 Delirium due to known physiological condition: Secondary | ICD-10-CM | POA: Diagnosis not present

## 2016-12-20 DIAGNOSIS — B9689 Other specified bacterial agents as the cause of diseases classified elsewhere: Secondary | ICD-10-CM | POA: Diagnosis not present

## 2016-12-20 DIAGNOSIS — J101 Influenza due to other identified influenza virus with other respiratory manifestations: Secondary | ICD-10-CM | POA: Diagnosis not present

## 2016-12-20 DIAGNOSIS — R279 Unspecified lack of coordination: Secondary | ICD-10-CM | POA: Diagnosis not present

## 2016-12-24 DIAGNOSIS — M25512 Pain in left shoulder: Secondary | ICD-10-CM | POA: Diagnosis not present

## 2016-12-24 DIAGNOSIS — G894 Chronic pain syndrome: Secondary | ICD-10-CM | POA: Diagnosis not present

## 2016-12-24 DIAGNOSIS — Z79891 Long term (current) use of opiate analgesic: Secondary | ICD-10-CM | POA: Diagnosis not present

## 2016-12-24 DIAGNOSIS — I10 Essential (primary) hypertension: Secondary | ICD-10-CM | POA: Diagnosis not present

## 2016-12-24 DIAGNOSIS — M130 Polyarthritis, unspecified: Secondary | ICD-10-CM | POA: Diagnosis not present

## 2016-12-24 DIAGNOSIS — I2782 Chronic pulmonary embolism: Secondary | ICD-10-CM | POA: Diagnosis not present

## 2016-12-24 DIAGNOSIS — Z79899 Other long term (current) drug therapy: Secondary | ICD-10-CM | POA: Diagnosis not present

## 2016-12-24 DIAGNOSIS — M25511 Pain in right shoulder: Secondary | ICD-10-CM | POA: Diagnosis not present

## 2016-12-24 DIAGNOSIS — E785 Hyperlipidemia, unspecified: Secondary | ICD-10-CM | POA: Diagnosis not present

## 2016-12-29 ENCOUNTER — Other Ambulatory Visit: Payer: Self-pay | Admitting: Neurology

## 2017-01-06 DIAGNOSIS — Z09 Encounter for follow-up examination after completed treatment for conditions other than malignant neoplasm: Secondary | ICD-10-CM | POA: Diagnosis not present

## 2017-01-06 DIAGNOSIS — M1710 Unilateral primary osteoarthritis, unspecified knee: Secondary | ICD-10-CM | POA: Diagnosis not present

## 2017-01-06 DIAGNOSIS — I739 Peripheral vascular disease, unspecified: Secondary | ICD-10-CM | POA: Diagnosis not present

## 2017-01-06 DIAGNOSIS — M19041 Primary osteoarthritis, right hand: Secondary | ICD-10-CM | POA: Diagnosis not present

## 2017-01-06 DIAGNOSIS — I2699 Other pulmonary embolism without acute cor pulmonale: Secondary | ICD-10-CM | POA: Diagnosis not present

## 2017-01-06 DIAGNOSIS — I2510 Atherosclerotic heart disease of native coronary artery without angina pectoris: Secondary | ICD-10-CM | POA: Diagnosis not present

## 2017-01-06 DIAGNOSIS — J449 Chronic obstructive pulmonary disease, unspecified: Secondary | ICD-10-CM | POA: Diagnosis not present

## 2017-01-06 DIAGNOSIS — J42 Unspecified chronic bronchitis: Secondary | ICD-10-CM | POA: Diagnosis not present

## 2017-01-06 DIAGNOSIS — E7800 Pure hypercholesterolemia, unspecified: Secondary | ICD-10-CM | POA: Diagnosis not present

## 2017-01-06 DIAGNOSIS — I10 Essential (primary) hypertension: Secondary | ICD-10-CM | POA: Diagnosis not present

## 2017-01-06 DIAGNOSIS — I4891 Unspecified atrial fibrillation: Secondary | ICD-10-CM | POA: Diagnosis not present

## 2017-01-06 DIAGNOSIS — Z299 Encounter for prophylactic measures, unspecified: Secondary | ICD-10-CM | POA: Diagnosis not present

## 2017-01-20 ENCOUNTER — Encounter: Payer: Self-pay | Admitting: Pulmonary Disease

## 2017-01-20 ENCOUNTER — Ambulatory Visit (INDEPENDENT_AMBULATORY_CARE_PROVIDER_SITE_OTHER)
Admission: RE | Admit: 2017-01-20 | Discharge: 2017-01-20 | Disposition: A | Discharge status: Home / Self Care | Payer: Medicare Other | Source: Ambulatory Visit | Attending: Pulmonary Disease | Admitting: Pulmonary Disease

## 2017-01-20 ENCOUNTER — Ambulatory Visit (INDEPENDENT_AMBULATORY_CARE_PROVIDER_SITE_OTHER): Payer: Medicare Other | Admitting: Pulmonary Disease

## 2017-01-20 VITALS — BP 134/78 | HR 80 | Ht 74.0 in | Wt 274.0 lb

## 2017-01-20 DIAGNOSIS — G4733 Obstructive sleep apnea (adult) (pediatric): Secondary | ICD-10-CM | POA: Diagnosis not present

## 2017-01-20 DIAGNOSIS — J449 Chronic obstructive pulmonary disease, unspecified: Secondary | ICD-10-CM

## 2017-01-20 DIAGNOSIS — R05 Cough: Secondary | ICD-10-CM | POA: Diagnosis not present

## 2017-01-20 MED ORDER — IPRATROPIUM-ALBUTEROL 0.5-2.5 (3) MG/3ML IN SOLN: 3.0000 mL | Freq: Three times a day (TID) | RESPIRATORY_TRACT | 6 refills | Status: AC

## 2017-01-20 NOTE — Patient Instructions (Signed)
Continue using the anoro We will give a prescription for duonebs to be used with the nebulizer. Uses it 3 times a day We scheduled for chest x-ray today and pulmonary function tests  Return to clinic in 6 months.

## 2017-01-20 NOTE — Progress Notes (Signed)
[name]    [id number]    [birth year]  Primary Care Physician:Vyas, Costella Hatcher, MD  Referring Physician: Glenda Chroman, MD [place], [place] [address]  Chief complaint:  C Follow up for COPD, bronchitis. OSA  HPI: [name] is a 76 year old with past history of COPD. He has complains of prolonged cough for the past few months associated with greenish sputum production. He has symptoms of dyspnea on exertion, occasional wheezing. He was evaluated at his primary care doctor Dr. Woody Seller and give a course of Levaquin. He states that the symptoms are improved today.  He does not have PFTs on record and has never been seen by pulmonologist. He is currently maintained on an Anoro. He has history of A fib, recurrent DVT, PE and is on long-term Xarelto anticoagulation. He is wheelchair bound from peripheral vascular disease, ankle fracture. He has history of snoring, witnessed apneas.  Interim history: PFTs were ordered at last visit but not done yet. He continues to have cough with mucus production, chest congestion. Denies any fevers, chills. He had an episode of C. difficile in April 2018 which was treated with po vancomycin.    Outpatient Encounter Prescriptions as of 01/20/2017  Medication Sig  . albuterol (PROVENTIL) (2.5 MG/3ML) 0.083% nebulizer solution   . allopurinol (ZYLOPRIM) 100 MG tablet Take 100 mg by mouth every evening.   Jearl Klinefelter ELLIPTA 62.5-25 MCG/INH AEPB Inhale 1 puff into the lungs daily.  Marland Kitchen ascorbic acid (VITAMIN C) 500 MG tablet Take 500 mg by mouth daily.   Marland Kitchen atorvastatin (LIPITOR) 80 MG tablet Take 80 mg by mouth every evening.   . benzonatate (TESSALON) 100 MG capsule   . calcium-vitamin D (OSCAL WITH D) 500-200 MG-UNIT per tablet Take 1 tablet by mouth daily.   . cilostazol (PLETAL) 100 MG tablet Take 1 tablet by mouth daily.  Marland Kitchen COLCRYS 0.6 MG tablet Take 1 tablet by mouth daily as needed (gout pain).   Marland Kitchen diclofenac sodium (VOLTAREN) 1 % GEL Apply 1  application topically daily. Applied to feet  . digoxin (LANOXIN) 0.125 MG tablet Take 1 tablet (0.125 mg total) by mouth daily.  Marland Kitchen diltiazem (CARDIZEM CD) 120 MG 24 hr capsule Take 1 capsule (120 mg total) by mouth daily. As directed based off blood pressure readings  . fenofibrate (TRICOR) 145 MG tablet Take 1 tablet (145 mg total) by mouth daily. (Patient taking differently: Take 145 mg by mouth every evening. )  . fentaNYL (DURAGESIC - DOSED MCG/HR) 75 MCG/HR Place 75 mcg onto the skin every 3 (three) days. Change patch every 3 days  . ferrous sulfate 325 (65 FE) MG tablet Take 325 mg by mouth daily with breakfast.  . finasteride (PROSCAR) 5 MG tablet Take 5 mg by mouth daily.    . fluticasone (FLONASE) 50 MCG/ACT nasal spray Place 2 sprays into both nostrils daily.  . furosemide (LASIX) 40 MG tablet Take 40 mg by mouth every evening. *May take 80mg  as needed for swelling  . HYDROcodone-acetaminophen (NORCO) 7.5-325 MG tablet Take 1 tablet by mouth every 4 (four) hours as needed for moderate pain.  Marland Kitchen lidocaine (LIDODERM) 5 % Place 1 patch onto the skin daily as needed (for pain). Every 12 hours as needed  . magnesium oxide (MAG-OX) 400 (241.3 MG) MG tablet Take 1 tablet (400 mg total) by mouth 2 (two) times daily.  . metoprolol (LOPRESSOR) 100 MG tablet Take 1 tablet (100 mg  total) by mouth 2 (two) times daily.  . metroNIDAZOLE (FLAGYL) 500 MG tablet Take 1 tablet (500 mg total) by mouth 2 (two) times daily.  . Multiple Vitamin (MULTIVITAMIN) tablet Take 1 tablet by mouth daily.    . nitroGLYCERIN (NITROSTAT) 0.4 MG SL tablet Place 1 tablet (0.4 mg total) under the tongue every 5 (five) minutes x 3 doses as needed.  . Omega-3 Fatty Acids (FISH OIL) 1000 MG CAPS Take 2 capsules by mouth daily.   . pantoprazole (PROTONIX) 40 MG tablet Take 40 mg by mouth 2 (two) times daily.   . potassium chloride SA (K-DUR,KLOR-CON) 20 MEQ tablet Take 2 tablets (40 mEq total) by mouth daily. (Patient taking  differently: Take 20 mEq by mouth daily. *Give additional tablet if Lasix is increased due to swelling)  . PROAIR HFA 108 (90 Base) MCG/ACT inhaler Inhale 2 puffs into the lungs 2 (two) times daily as needed for wheezing or shortness of breath. 2 puffs twice daily  . psyllium (METAMUCIL SMOOTH TEXTURE) 28 % packet Take 1 packet by mouth at bedtime.  . rivaroxaban (XARELTO) 20 MG TABS tablet Take 1 tablet (20 mg total) by mouth daily.  . rivastigmine (EXELON) 9.5 mg/24hr APPLY 1 PATCH DAILY.  . tamsulosin (FLOMAX) 0.4 MG CAPS capsule Take 0.4 mg by mouth daily after supper.  . vancomycin (VANCOCIN) 125 MG capsule Take 1 capsule (125 mg total) by mouth 4 (four) times daily.   No facility-administered encounter medications on file as of 01/20/2017.     Allergies as of 01/20/2017 - Review Complete 01/20/2017  Allergen Reaction Noted  . Omeprazole Other (See Comments) 07/29/2015  . Other  07/29/2015  . Penicillins Other (See Comments)   . Sulfonamide derivatives Other (See Comments)   . Iodine Rash     Past Medical History:  Diagnosis Date  . Atrial fibrillation ([place])   . Atrial flutter ([place])    Possible history in the past - details unclear  . Bilateral pulmonary embolism ([place])    a. 10/2012 - V:Q scan high prob for Bilat PE in setting of dyspnea/hemoptysis-->anticoagulation started.  Marland Kitchen BPH (benign prostatic hyperplasia)   . CAD (coronary artery disease)    Stent PCI in [place] and also  in the past - details not clear  . Chronic anticoagulation   . Chronic pain   . 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 COPD (chronic obstructive pulmonary disease) ([place])   . Depression   . Dyslipidemia   . Essential hypertension   . GERD (gastroesophageal reflux disease)   . GI bleed   . Gout   . Hyperlipidemia    Mixed  . Irritable bowel   . Osteoarthritis   . Osteoporosis   . Peripheral arterial disease ([place])    Dopplers Dr.Vyas office October, 2010, apparent occlusion  anterior tibial arteries bilaterally., findings suggest hemodynamically significant stenosis of the proximal right provisional femoral artery and the mid left superficial artery.  Ankle-brachial indices indicate moderate stenosis within both lower extremities  . Posttraumatic stress disorder   . Recurrent UTI (urinary tract infection)   . Right leg DVT ([place])    a. 10/2012 - subocclusive DVT R popliteal vein.  . Wheelchair bound     Past Surgical History:  Procedure Laterality Date  . [place]  . CATARACT EXTRACTION    . COLON RESECTION    . COLONOSCOPY N/A 03/13/2013   Procedure: COLONOSCOPY;  Surgeon: Rogene Houston,  MD;  Location: [facility];  Service: Endoscopy;  Laterality: N/A;  730  . COLONOSCOPY N/A 12/22/2014   Procedure: COLONOSCOPY;  Surgeon: Rogene Houston, MD;  Location: [facility];  Service: Endoscopy;  Laterality: N/A;  . CORONARY ANGIOPLASTY WITH STENT PLACEMENT     x 4  . GIVENS CAPSULE STUDY N/A 12/24/2014   Procedure: GIVENS CAPSULE STUDY;  Surgeon: Rogene Houston, MD;  Location: [facility];  Service: Endoscopy;  Laterality: N/A;  . KNEE ARTHROPLASTY     Right  . PROSTATE SURGERY    . TOTAL HIP ARTHROPLASTY     Right-complicated hx  . TOTAL KNEE ARTHROPLASTY     Left    Family History  Problem Relation Age of Onset  . Diabetes Daughter   . Hypertension Daughter   . Obesity Daughter   . Cancer Maternal Grandmother     Social History   Social History  . Marital status: Married    Spouse name: N/A  . Number of children: N/A  . Years of education: N/A   Occupational History  . Tour manager Retired    Retired  . Vet     Retired   Social History Main Topics  . Smoking status: Former Smoker    Packs/day: 1.00    Years: 30.00    Types: Cigarettes    Start date: 08/31/1956    Quit date: 08/17/2003  . Smokeless tobacco: Never Used     Comment: Patient smoked a pack a day for about 30 years  . Alcohol use No  . Drug  use: No  . Sexual activity: Not on file   Other Topics Concern  . Not on file   Social History Narrative   Lives with wife at home, [place].  Bedbound and mobilized wheelchair.  Two hours 5 days of week of HH aid.   1 child   Retired Geographical information systems officer         Review of systems: Review of Systems  Constitutional: Negative for fever and chills.  HENT: Negative.   Eyes: Negative for blurred vision.  Respiratory: as per HPI  Cardiovascular: Negative for chest pain and palpitations.  Gastrointestinal: Negative for vomiting, diarrhea, blood per rectum. Genitourinary: Negative for dysuria, urgency, frequency and hematuria.  Musculoskeletal: Negative for myalgias, back pain and joint pain.  Skin: Negative for itching and rash.  Neurological: Negative for dizziness, tremors, focal weakness, seizures and loss of consciousness.  Endo/Heme/Allergies: Negative for environmental allergies.  Psychiatric/Behavioral: Negative for depression, suicidal ideas and hallucinations.  All other systems reviewed and are negative.   Physical Exam: Blood pressure 134/78, pulse 80, height 6\' 2"  (1.88 m), weight 274 lb (124.3 kg), SpO2 96 %. Gen:      No acute distress HEENT:  EOMI, sclera anicteric Neck:     No masses; no thyromegaly Lungs:    Scattered crackles; normal respiratory effort CV:         Regular rate and rhythm; no murmurs Abd:      + bowel sounds; soft, non-tender; no palpable masses, no distension Ext:    No edema; adequate peripheral perfusion Skin:      Warm and dry; no rash Neuro: alert and oriented x 3 Psych: normal mood and affect  Data Reviewed: CT scan 01/03/15 > Small LLL PE, b/l lower lobe atelectasis. Images reviewed.  CXR 05/06/16- Stable cardiomegaly, pulmonary venous hypertension.Images reviewed. I have reviewed all images personally.   Echo 10/17/12 - Left ventricle: The cavity  size was normal. There was mild concentric hypertrophy. Systolic function was normal.  The estimated ejection fraction was in the range of 55% to 60%. - Mitral valve: Calcified annulus. - Left atrium: The atrium was mildly to moderately dilated. - Right atrium: The atrium was mildly dilated. - Pulmonary arteries: PA peak pressure: 37mm Hg (S).  Sleep study 06/03/16 Severe OSA, AHI sent to 72.3, O2 sats 83%  CPAP download 08/04/16 to 09/02/16 Days greater than 4 hours - 0% Days less than 4 hours - 13% Average use/day- 1 min  Assessment:  #1 Chronic bronchitis, COPD. He has had several episodes of bronchitis, respiratory infection the past treated with antibiotics. He is currently stable on an Anoro. He will be continued this and use the albuterol inhaler, nebulizers on a when necessary basis. I will add duonebs as I am not sure how effective is the delivery of his meds via inhaler I'll schedule him for Chest x ray and pulmonary function tests for further evaluation of his COPD. I don't believe he needs antibiotics today and would like to avoid given his ongoing issues with diarrhea.  #2 PE, DVT He has history of recurrent PE, DVT. He is on long-term anticoagulation with Xarelto.  #3 Suspected OSA. Severe OSA confirmed on home sleep study. Not tolerant of CPAP.  Plan/Recommendations: - Continue anoro, albuterol, duonebs PRN - PFTS - CXR today - Continue xarelto  Marshell Garfinkel MD [facility] Pager [phone] 01/20/2017, 2:11 PM  CC: Glenda Chroman, MD   Addendum

## 2017-01-21 DIAGNOSIS — M130 Polyarthritis, unspecified: Secondary | ICD-10-CM | POA: Diagnosis not present

## 2017-01-21 DIAGNOSIS — E785 Hyperlipidemia, unspecified: Secondary | ICD-10-CM | POA: Diagnosis not present

## 2017-01-21 DIAGNOSIS — I10 Essential (primary) hypertension: Secondary | ICD-10-CM | POA: Diagnosis not present

## 2017-01-21 DIAGNOSIS — Z79891 Long term (current) use of opiate analgesic: Secondary | ICD-10-CM | POA: Diagnosis not present

## 2017-01-21 DIAGNOSIS — I2782 Chronic pulmonary embolism: Secondary | ICD-10-CM | POA: Diagnosis not present

## 2017-01-21 DIAGNOSIS — M25511 Pain in right shoulder: Secondary | ICD-10-CM | POA: Diagnosis not present

## 2017-01-21 DIAGNOSIS — G894 Chronic pain syndrome: Secondary | ICD-10-CM | POA: Diagnosis not present

## 2017-01-28 ENCOUNTER — Ambulatory Visit: Payer: Medicare Other | Admitting: Neurology

## 2017-02-10 ENCOUNTER — Emergency Department (HOSPITAL_COMMUNITY): Payer: Medicare Other

## 2017-02-10 ENCOUNTER — Observation Stay (HOSPITAL_COMMUNITY)
Admission: EM | Admit: 2017-02-10 | Discharge: 2017-02-12 | Disposition: A | Discharge status: Home / Self Care | Payer: Medicare Other | Attending: Internal Medicine | Admitting: Internal Medicine

## 2017-02-10 ENCOUNTER — Encounter (HOSPITAL_COMMUNITY): Payer: Self-pay | Admitting: Cardiology

## 2017-02-10 DIAGNOSIS — I482 Chronic atrial fibrillation: Secondary | ICD-10-CM | POA: Diagnosis not present

## 2017-02-10 DIAGNOSIS — N189 Chronic kidney disease, unspecified: Secondary | ICD-10-CM | POA: Diagnosis not present

## 2017-02-10 DIAGNOSIS — Z87891 Personal history of nicotine dependence: Secondary | ICD-10-CM | POA: Insufficient documentation

## 2017-02-10 DIAGNOSIS — I129 Hypertensive chronic kidney disease with stage 1 through stage 4 chronic kidney disease, or unspecified chronic kidney disease: Secondary | ICD-10-CM | POA: Insufficient documentation

## 2017-02-10 DIAGNOSIS — R55 Syncope and collapse: Principal | ICD-10-CM | POA: Diagnosis present

## 2017-02-10 DIAGNOSIS — I480 Paroxysmal atrial fibrillation: Secondary | ICD-10-CM | POA: Insufficient documentation

## 2017-02-10 DIAGNOSIS — Z7901 Long term (current) use of anticoagulants: Secondary | ICD-10-CM | POA: Insufficient documentation

## 2017-02-10 DIAGNOSIS — Z96651 Presence of right artificial knee joint: Secondary | ICD-10-CM | POA: Diagnosis not present

## 2017-02-10 DIAGNOSIS — M25519 Pain in unspecified shoulder: Secondary | ICD-10-CM | POA: Diagnosis present

## 2017-02-10 DIAGNOSIS — R4182 Altered mental status, unspecified: Secondary | ICD-10-CM | POA: Diagnosis not present

## 2017-02-10 DIAGNOSIS — I2510 Atherosclerotic heart disease of native coronary artery without angina pectoris: Secondary | ICD-10-CM | POA: Diagnosis not present

## 2017-02-10 DIAGNOSIS — E876 Hypokalemia: Secondary | ICD-10-CM

## 2017-02-10 DIAGNOSIS — G894 Chronic pain syndrome: Secondary | ICD-10-CM | POA: Insufficient documentation

## 2017-02-10 DIAGNOSIS — I4891 Unspecified atrial fibrillation: Secondary | ICD-10-CM | POA: Diagnosis present

## 2017-02-10 DIAGNOSIS — Z9229 Personal history of other drug therapy: Secondary | ICD-10-CM

## 2017-02-10 DIAGNOSIS — J449 Chronic obstructive pulmonary disease, unspecified: Secondary | ICD-10-CM | POA: Diagnosis present

## 2017-02-10 DIAGNOSIS — F0390 Unspecified dementia without behavioral disturbance: Secondary | ICD-10-CM | POA: Diagnosis not present

## 2017-02-10 DIAGNOSIS — E785 Hyperlipidemia, unspecified: Secondary | ICD-10-CM | POA: Diagnosis present

## 2017-02-10 DIAGNOSIS — S060X0A Concussion without loss of consciousness, initial encounter: Secondary | ICD-10-CM | POA: Diagnosis not present

## 2017-02-10 DIAGNOSIS — J9811 Atelectasis: Secondary | ICD-10-CM | POA: Diagnosis not present

## 2017-02-10 DIAGNOSIS — I517 Cardiomegaly: Secondary | ICD-10-CM | POA: Diagnosis not present

## 2017-02-10 DIAGNOSIS — Z79899 Other long term (current) drug therapy: Secondary | ICD-10-CM | POA: Insufficient documentation

## 2017-02-10 DIAGNOSIS — R4020 Unspecified coma: Secondary | ICD-10-CM

## 2017-02-10 DIAGNOSIS — F03A0 Unspecified dementia, mild, without behavioral disturbance, psychotic disturbance, mood disturbance, and anxiety: Secondary | ICD-10-CM | POA: Diagnosis present

## 2017-02-10 DIAGNOSIS — M12811 Other specific arthropathies, not elsewhere classified, right shoulder: Secondary | ICD-10-CM | POA: Diagnosis not present

## 2017-02-10 DIAGNOSIS — I10 Essential (primary) hypertension: Secondary | ICD-10-CM | POA: Diagnosis present

## 2017-02-10 DIAGNOSIS — R29810 Facial weakness: Secondary | ICD-10-CM | POA: Diagnosis not present

## 2017-02-10 DIAGNOSIS — M12812 Other specific arthropathies, not elsewhere classified, left shoulder: Secondary | ICD-10-CM | POA: Diagnosis not present

## 2017-02-10 LAB — PROTIME-INR
INR: 5.38
Prothrombin Time: 50.7 seconds — ABNORMAL HIGH (ref 11.4–15.2)

## 2017-02-10 LAB — CBC WITH DIFFERENTIAL/PLATELET
BASOS ABS: 0 10*3/uL (ref 0.0–0.1)
Basophils Relative: 0 %
EOS ABS: 0.1 10*3/uL (ref 0.0–0.7)
EOS PCT: 1 %
HCT: 36 % — ABNORMAL LOW (ref 39.0–52.0)
Hemoglobin: 12 g/dL — ABNORMAL LOW (ref 13.0–17.0)
LYMPHS PCT: 8 %
Lymphs Abs: 1.1 10*3/uL (ref 0.7–4.0)
MCH: 31.7 pg (ref 26.0–34.0)
MCHC: 33.3 g/dL (ref 30.0–36.0)
MCV: 95.2 fL (ref 78.0–100.0)
Monocytes Absolute: 0.6 10*3/uL (ref 0.1–1.0)
Monocytes Relative: 5 %
Neutro Abs: 11.7 10*3/uL — ABNORMAL HIGH (ref 1.7–7.7)
Neutrophils Relative %: 86 %
PLATELETS: 160 10*3/uL (ref 150–400)
RBC: 3.78 MIL/uL — AB (ref 4.22–5.81)
RDW: 18.5 % — ABNORMAL HIGH (ref 11.5–15.5)
WBC: 13.4 10*3/uL — AB (ref 4.0–10.5)

## 2017-02-10 LAB — BASIC METABOLIC PANEL
Anion gap: 11 (ref 5–15)
BUN: 17 mg/dL (ref 6–20)
CO2: 23 mmol/L (ref 22–32)
CREATININE: 0.99 mg/dL (ref 0.61–1.24)
Calcium: 8.9 mg/dL (ref 8.9–10.3)
Chloride: 101 mmol/L (ref 101–111)
GFR calc Af Amer: >60 mL/min (ref >60)
Glucose, Bld: 169 mg/dL — ABNORMAL HIGH (ref 65–99)
POTASSIUM: 3.3 mmol/L — AB (ref 3.5–5.1)
SODIUM: 135 mmol/L (ref 135–145)

## 2017-02-10 LAB — URINALYSIS, ROUTINE W REFLEX MICROSCOPIC
BILIRUBIN URINE: NEGATIVE
GLUCOSE, UA: NEGATIVE mg/dL
KETONES UR: NEGATIVE mg/dL
Nitrite: NEGATIVE
PH: 5 (ref 5.0–8.0)
Protein, ur: 100 mg/dL — AB
Specific Gravity, Urine: 1.016 (ref 1.005–1.030)
Squamous Epithelial / LPF: NONE SEEN

## 2017-02-10 MED ORDER — ONDANSETRON HCL 4 MG PO TABS: 4.0000 mg | ORAL_TABLET | Freq: Four times a day (QID) | ORAL | Status: DC | PRN

## 2017-02-10 MED ORDER — TAMSULOSIN HCL 0.4 MG PO CAPS
0.4000 mg | ORAL_CAPSULE | Freq: Every day | ORAL | Status: DC
  Administered 2017-02-11 – 2017-02-12 (×2): 0.4 mg via ORAL
  Filled 2017-02-10 (×2): qty 1

## 2017-02-10 MED ORDER — LACTATED RINGERS IV BOLUS (SEPSIS)
1000.0000 mL | Freq: Once | INTRAVENOUS | Status: AC
  Administered 2017-02-10: 1000 mL via INTRAVENOUS

## 2017-02-10 MED ORDER — CILOSTAZOL 100 MG PO TABS
100.0000 mg | ORAL_TABLET | Freq: Every day | ORAL | Status: DC
  Administered 2017-02-11 – 2017-02-12 (×2): 100 mg via ORAL
  Filled 2017-02-10 (×4): qty 1

## 2017-02-10 MED ORDER — METOPROLOL TARTRATE 50 MG PO TABS
100.0000 mg | ORAL_TABLET | Freq: Two times a day (BID) | ORAL | Status: DC
  Administered 2017-02-11 – 2017-02-12 (×4): 100 mg via ORAL
  Filled 2017-02-10 (×4): qty 2

## 2017-02-10 MED ORDER — IPRATROPIUM-ALBUTEROL 0.5-2.5 (3) MG/3ML IN SOLN
3.0000 mL | Freq: Three times a day (TID) | RESPIRATORY_TRACT | Status: DC
  Administered 2017-02-11 – 2017-02-12 (×6): 3 mL via RESPIRATORY_TRACT
  Filled 2017-02-10 (×6): qty 3

## 2017-02-10 MED ORDER — MAGNESIUM SULFATE 2 GM/50ML IV SOLN
2.0000 g | Freq: Once | INTRAVENOUS | Status: DC
  Filled 2017-02-10 (×2): qty 50

## 2017-02-10 MED ORDER — FENTANYL 25 MCG/HR TD PT72
25.0000 ug | MEDICATED_PATCH | TRANSDERMAL | Status: DC
  Administered 2017-02-11: 25 ug via TRANSDERMAL
  Filled 2017-02-10: qty 1

## 2017-02-10 MED ORDER — DILTIAZEM HCL ER COATED BEADS 120 MG PO CP24
120.0000 mg | ORAL_CAPSULE | Freq: Every day | ORAL | Status: DC
  Administered 2017-02-11 – 2017-02-12 (×2): 120 mg via ORAL
  Filled 2017-02-10 (×2): qty 1

## 2017-02-10 MED ORDER — PANTOPRAZOLE SODIUM 40 MG PO TBEC
40.0000 mg | DELAYED_RELEASE_TABLET | Freq: Two times a day (BID) | ORAL | Status: DC
  Administered 2017-02-11 – 2017-02-12 (×4): 40 mg via ORAL
  Filled 2017-02-10 (×4): qty 1

## 2017-02-10 MED ORDER — VITAMIN C 500 MG PO TABS
500.0000 mg | ORAL_TABLET | Freq: Every day | ORAL | Status: DC
  Administered 2017-02-11 – 2017-02-12 (×2): 500 mg via ORAL
  Filled 2017-02-10 (×2): qty 1

## 2017-02-10 MED ORDER — MAGNESIUM OXIDE 400 (241.3 MG) MG PO TABS
400.0000 mg | ORAL_TABLET | Freq: Two times a day (BID) | ORAL | Status: DC
  Administered 2017-02-11 – 2017-02-12 (×3): 400 mg via ORAL
  Filled 2017-02-10 (×3): qty 1

## 2017-02-10 MED ORDER — RIVASTIGMINE 9.5 MG/24HR TD PT24
9.5000 mg | MEDICATED_PATCH | Freq: Every day | TRANSDERMAL | Status: DC
  Administered 2017-02-11 – 2017-02-12 (×2): 9.5 mg via TRANSDERMAL
  Filled 2017-02-10 (×4): qty 1

## 2017-02-10 MED ORDER — POTASSIUM CHLORIDE 10 MEQ/100ML IV SOLN
10.0000 meq | Freq: Once | INTRAVENOUS | Status: AC
  Administered 2017-02-11: 10 meq via INTRAVENOUS

## 2017-02-10 MED ORDER — CALCIUM CARBONATE-VITAMIN D 500-200 MG-UNIT PO TABS
1.0000 | ORAL_TABLET | Freq: Every day | ORAL | Status: DC
  Administered 2017-02-11 – 2017-02-12 (×2): 1 via ORAL
  Filled 2017-02-10 (×2): qty 1

## 2017-02-10 MED ORDER — HYDROCODONE-ACETAMINOPHEN 7.5-325 MG PO TABS
1.0000 | ORAL_TABLET | ORAL | Status: DC | PRN
  Administered 2017-02-11 – 2017-02-12 (×6): 1 via ORAL
  Filled 2017-02-10 (×7): qty 1

## 2017-02-10 MED ORDER — COLCHICINE 0.6 MG PO TABS: 0.6000 mg | ORAL_TABLET | Freq: Every day | ORAL | Status: DC | PRN

## 2017-02-10 MED ORDER — ADULT MULTIVITAMIN W/MINERALS CH
1.0000 | ORAL_TABLET | Freq: Every day | ORAL | Status: DC
  Administered 2017-02-11 – 2017-02-12 (×2): 1 via ORAL
  Filled 2017-02-10 (×2): qty 1

## 2017-02-10 MED ORDER — FLUTICASONE PROPIONATE 50 MCG/ACT NA SUSP
2.0000 | Freq: Every day | NASAL | Status: DC
  Administered 2017-02-11 – 2017-02-12 (×2): 2 via NASAL
  Filled 2017-02-10: qty 16

## 2017-02-10 MED ORDER — SODIUM CHLORIDE 0.9 % IV SOLN
INTRAVENOUS | Status: DC
  Administered 2017-02-11: via INTRAVENOUS

## 2017-02-10 MED ORDER — ALLOPURINOL 100 MG PO TABS
100.0000 mg | ORAL_TABLET | Freq: Every evening | ORAL | Status: DC
  Administered 2017-02-11 – 2017-02-12 (×2): 100 mg via ORAL
  Filled 2017-02-10 (×2): qty 1

## 2017-02-10 MED ORDER — DIGOXIN 125 MCG PO TABS
0.1250 mg | ORAL_TABLET | Freq: Every day | ORAL | Status: DC
  Administered 2017-02-11 – 2017-02-12 (×2): 0.125 mg via ORAL
  Filled 2017-02-10 (×2): qty 1

## 2017-02-10 MED ORDER — FINASTERIDE 5 MG PO TABS
5.0000 mg | ORAL_TABLET | Freq: Every day | ORAL | Status: DC
  Administered 2017-02-11 – 2017-02-12 (×2): 5 mg via ORAL
  Filled 2017-02-10 (×4): qty 1

## 2017-02-10 MED ORDER — POTASSIUM CHLORIDE 10 MEQ/100ML IV SOLN
10.0000 meq | INTRAVENOUS | Status: DC
  Administered 2017-02-10: 10 meq via INTRAVENOUS
  Filled 2017-02-10: qty 100

## 2017-02-10 MED ORDER — ATORVASTATIN CALCIUM 40 MG PO TABS
80.0000 mg | ORAL_TABLET | Freq: Every evening | ORAL | Status: DC
  Administered 2017-02-11 – 2017-02-12 (×2): 80 mg via ORAL
  Filled 2017-02-10 (×2): qty 2

## 2017-02-10 MED ORDER — ONDANSETRON HCL 4 MG/2ML IJ SOLN: 4.0000 mg | Freq: Four times a day (QID) | INTRAMUSCULAR | Status: DC | PRN

## 2017-02-10 NOTE — H&P (Signed)
TRH H&P    Patient Demographics:    Bryce Berry, is a 76 y.o. male  MRN: [id number]  DOB - [birth year]  Admit Date - 02/10/2017  Referring MD/NP/PA: Dr Dayna Barker  Outpatient Primary MD for the patient is Glenda Chroman, MD  Patient coming from: home  Chief Complaint  Patient presents with  . Loss of Consciousness      HPI:    Bryce Berry  is a 76 y.o. male, With history of atrial fibrillation, pulmonary embolism, depression, CAD, chronic anti-correlation with Xarelto, COPD, hypertension, chronic wheelchair-bound status, was brought to hospital today after patient lost consciousness this morning. As per patient's wife this morning patient was supposed to go to orthopedics to get injection in both his shoulders, he was complaining of pain so wife put 50 g fentanyl patch instead of 25 g daily, which is his usual dose. She also gave him 2 tablets of Vicodin. After patient was seen by orthopedics and injections given in both shoulders when patient was coming back home in a [place], he became unresponsive with drooling from the right lip. Patient became responsive after 3-5 minutes. There was no seizure-like activity noted, patient doesn't recall this incident. Vitals checked at that time showed normal blood pressure with SBP around 120s as per patient's wife. Blood glucose was normal. He denies chest pain, no shortness of breath. No nausea vomiting or diarrhea. No headache no blurred vision.  In the ED lab work showed INR 5.38, potassium 3.3. EKG showed normal sinus rhythm, short PR interval, prolonged QT interval 536 Patient got 2 g mag sulfate in the ED   Review of systems:      All other systems reviewed and are negative.   With Past History of the following :    Past Medical History:  Diagnosis Date  . Atrial fibrillation ([place])   . Atrial flutter ([place])    Possible history in the past - details unclear    . Bilateral pulmonary embolism ([place])    a. 10/2012 - V:Q scan high prob for Bilat PE in setting of dyspnea/hemoptysis-->anticoagulation started.  Marland Kitchen BPH (benign prostatic hyperplasia)   . CAD (coronary artery disease)    Stent PCI in [place] and also [place] in the past - details not clear  . Chronic anticoagulation   . Chronic pain   . 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 COPD (chronic obstructive pulmonary disease) ([place])   . Depression   . Dyslipidemia   . Essential hypertension   . GERD (gastroesophageal reflux disease)   . GI bleed   . Gout   . Hyperlipidemia    Mixed  . Irritable bowel   . Osteoarthritis   . Osteoporosis   . Peripheral arterial disease ([place])    Dopplers Dr.Vyas office October, 2010, apparent occlusion anterior tibial arteries bilaterally., findings suggest hemodynamically significant stenosis of the proximal right provisional femoral artery and the mid left superficial artery.  Ankle-brachial indices indicate moderate stenosis within both lower extremities  . Posttraumatic stress disorder   .  Recurrent UTI (urinary tract infection)   . Right leg DVT ([place])    a. 10/2012 - subocclusive DVT R popliteal vein.  . Wheelchair bound       Past Surgical History:  Procedure Laterality Date  . [place]  . CATARACT EXTRACTION    . COLON RESECTION    . COLONOSCOPY N/A 03/13/2013   Procedure: COLONOSCOPY;  Surgeon: Rogene Houston, MD;  Location[facility];  Service: Endoscopy;  Laterality: N/A;  730  . COLONOSCOPY N/A 12/22/2014   Procedure: COLONOSCOPY;  Surgeon: Rogene Houston, MD;  Location: [facility];  Service: Endoscopy;  Laterality: N/A;  . CORONARY ANGIOPLASTY WITH STENT PLACEMENT     x 4  . GIVENS CAPSULE STUDY N/A 12/24/2014   Procedure: GIVENS CAPSULE STUDY;  Surgeon: Rogene Houston, MD;  Location: [facility];  Service: Endoscopy;  Laterality: N/A;  . KNEE ARTHROPLASTY     Right  . PROSTATE SURGERY    .  TOTAL HIP ARTHROPLASTY     Right-complicated hx  . TOTAL KNEE ARTHROPLASTY     Left      Social History:      Social History  Substance Use Topics  . Smoking status: Former Smoker    Packs/day: 1.00    Years: 30.00    Types: Cigarettes    Start date: 08/31/1956    Quit date: 08/17/2003  . Smokeless tobacco: Never Used     Comment: Patient smoked a pack a day for about 30 years  . Alcohol use No       Family History :     Family History  Problem Relation Age of Onset  . Diabetes Daughter   . Hypertension Daughter   . Obesity Daughter   . Cancer Maternal Grandmother       Home Medications:   Prior to Admission medications   Medication Sig Start Date End Date Taking? Authorizing Provider  albuterol (PROVENTIL) (2.5 MG/3ML) 0.083% nebulizer solution Take 2.5 mg by nebulization every 6 (six) hours as needed for wheezing or shortness of breath.  01/11/17  Yes [provider]  allopurinol (ZYLOPRIM) 100 MG tablet Take 100 mg by mouth every evening.    Yes [provider]  ANORO ELLIPTA 62.5-25 MCG/INH AEPB Inhale 1 puff into the lungs daily. 04/19/16  Yes [provider]  ascorbic acid (VITAMIN C) 500 MG tablet Take 500 mg by mouth daily.    Yes [provider]  atorvastatin (LIPITOR) 80 MG tablet Take 80 mg by mouth every evening.    Yes [provider]  benzonatate (TESSALON) 100 MG capsule Take 100 mg by mouth 3 (three) times daily as needed for cough.  01/06/17  Yes [provider]  calcium-vitamin D (OSCAL WITH D) 500-200 MG-UNIT per tablet Take 1 tablet by mouth daily.    Yes [provider]  cilostazol (PLETAL) 100 MG tablet Take 1 tablet by mouth daily. 07/07/15  Yes [provider]  ciprofloxacin (CIPRO) 500 MG tablet Take 500 mg by mouth 2 (two) times daily. 7 day course prescribed on 01/31/2017 01/31/17  Yes [provider]  COLCRYS 0.6 MG tablet Take 1 tablet by mouth daily as needed (gout  pain).  07/07/15  Yes [provider]  diclofenac sodium (VOLTAREN) 1 % GEL Apply 1 application topically daily. Applied to feet 05/20/15  Yes [provider]  digoxin (LANOXIN) 0.125 MG tablet Take 1 tablet (0.125 mg  total) by mouth daily. 04/15/16  Yes Satira Sark, MD  diltiazem (CARDIZEM CD) 120 MG 24 hr capsule Take 1 capsule (120 mg total) by mouth daily. As directed based off blood pressure readings 04/15/16  Yes Satira Sark, MD  fenofibrate (TRICOR) 145 MG tablet Take 1 tablet (145 mg total) by mouth daily. Patient taking differently: Take 145 mg by mouth every evening.  08/16/11  Yes de Stanford Scotland, MD  fentaNYL (DURAGESIC - DOSED MCG/HR) 25 MCG/HR patch Place 25 mcg onto the skin every 3 (three) days.   Yes [provider]  ferrous sulfate 325 (65 FE) MG tablet Take 325 mg by mouth daily with breakfast.   Yes [provider]  finasteride (PROSCAR) 5 MG tablet Take 5 mg by mouth daily.     Yes [provider]  fluticasone (FLONASE) 50 MCG/ACT nasal spray Place 2 sprays into both nostrils daily.   Yes [provider]  furosemide (LASIX) 40 MG tablet Take 40 mg by mouth every evening. *May take 80mg  as needed for swelling   Yes [provider]  HYDROcodone-acetaminophen (NORCO) 10-325 MG tablet Take 1 tablet by mouth every 6 (six) hours as needed for moderate pain or severe pain.  01/21/17  Yes [provider]  ipratropium-albuterol (DUONEB) 0.5-2.5 (3) MG/3ML SOLN Take 3 mLs by nebulization 3 (three) times daily. 01/20/17  Yes Mannam, Praveen, MD  lidocaine (LIDODERM) 5 % Place 1 patch onto the skin daily as needed (for pain). Every 12 hours as needed 08/29/15  Yes [provider]  magnesium oxide (MAG-OX) 400 (241.3 MG) MG tablet Take 1 tablet (400 mg total) by mouth 2 (two) times daily. 01/06/15  Yes Reyne Dumas, MD  metoprolol (LOPRESSOR) 100 MG tablet Take 1 tablet (100 mg total) by mouth 2 (two) times  daily. 02/07/12  Yes Serpe, Burna Forts, PA-C  Multiple Vitamin (MULTIVITAMIN) tablet Take 1 tablet by mouth daily.     Yes [provider]  nitroGLYCERIN (NITROSTAT) 0.4 MG SL tablet Place 1 tablet (0.4 mg total) under the tongue every 5 (five) minutes x 3 doses as needed. 01/07/15  Yes Carlena Bjornstad, MD  Omega-3 Fatty Acids (FISH OIL) 1000 MG CAPS Take 2 capsules by mouth daily.    Yes [provider]  pantoprazole (PROTONIX) 40 MG tablet Take 40 mg by mouth 2 (two) times daily.    Yes [provider]  potassium chloride SA (K-DUR,KLOR-CON) 20 MEQ tablet Take 2 tablets (40 mEq total) by mouth daily. Patient taking differently: Take 20 mEq by mouth daily. *Give additional tablet if Lasix is increased due to swelling 01/05/15  Yes Reyne Dumas, MD  PROAIR HFA 108 (90 Base) MCG/ACT inhaler Inhale 2 puffs into the lungs 2 (two) times daily as needed for wheezing or shortness of breath. 2 puffs twice daily 08/20/15  Yes [provider]  rivaroxaban (XARELTO) 20 MG TABS tablet Take 1 tablet (20 mg total) by mouth daily. 01/27/15  Yes Reyne Dumas, MD  rivastigmine (EXELON) 9.5 mg/24hr APPLY 1 PATCH DAILY. 12/29/16  Yes Cameron Sprang, MD  tamsulosin (FLOMAX) 0.4 MG CAPS capsule Take 0.4 mg by mouth daily after supper.   Yes [provider]     Allergies:     Allergies  Allergen Reactions  . Omeprazole Other (See Comments)    Reaction unknown-possible upset stomach  . Other     Paper tape  . Penicillins Other (See Comments)    Has patient  had a PCN reaction causing immediate rash, facial/tongue/throat swelling, SOB or lightheadedness with hypotension: No Has patient had a PCN reaction causing severe rash involving mucus membranes or skin necrosis: No Has patient had a PCN reaction that required hospitalization No Has patient had a PCN reaction occurring within the last 10 years: No If all of the above answers are "NO", then may proceed with Cephalosporin  use.    Rapid heart rate; flushed  . Sulfonamide Derivatives Other (See Comments)    Rapid heart rate; flushed  . Iodine Rash     Physical Exam:   Vitals  Blood pressure 102/68, pulse (!) 101, resp. rate 19, height 6\' 2"  (1.88 m), weight 124.7 kg (275 lb), SpO2 99 %.  1.  General: Appears in no acute distress   Psychiatric:  Intact judgement and  insight, awake alert, oriented x 3.  3. Neurologic: No focal neurological deficits, all cranial nerves intact.Strength 5/5 all 4 extremities, sensation intact all 4 extremities, plantars down going.  4. Eyes :  anicteric sclerae, moist conjunctivae with no lid lag. PERRLA.  5. ENMT:  Oropharynx clear with moist mucous membranes and good dentition  6. Neck:  supple, no cervical lymphadenopathy appriciated, No thyromegaly  7. Respiratory : Normal respiratory effort, good air movement bilaterally,clear to  auscultation bilaterally  8. Cardiovascular : RRR, no gallops, rubs or murmurs, no leg edema  9. Gastrointestinal:  Positive bowel sounds, abdomen soft, non-tender to palpation,no hepatosplenomegaly, no rigidity or guarding       10. Skin:  No cyanosis, normal texture and turgor, no rash, lesions or ulcers  11.Musculoskeletal:  Good muscle tone,  joints appear normal , no effusions,  normal range of motion    Data Review:    CBC  Recent Labs Lab 02/10/17 1741  WBC 13.4*  HGB 12.0*  HCT 36.0*  PLT 160  MCV 95.2  MCH 31.7  MCHC 33.3  RDW 18.5*  LYMPHSABS 1.1  MONOABS 0.6  EOSABS 0.1  BASOSABS 0.0   ------------------------------------------------------------------------------------------------------------------  Chemistries   Recent Labs Lab 02/10/17 1741  NA 135  K 3.3*  CL 101  CO2 23  GLUCOSE 169*  BUN 17  CREATININE 0.99  CALCIUM 8.9    ------------------------------------------------------------------------------------------------------------------  ------------------------------------------------------------------------------------------------------------------ GFR: Estimated Creatinine Clearance: 91.9 mL/min (by C-G formula based on SCr of 0.99 mg/dL). Liver Function Tests: No results for input(s): AST, ALT, ALKPHOS, BILITOT, PROT, ALBUMIN in the last 168 hours. No results for input(s): LIPASE, AMYLASE in the last 168 hours. No results for input(s): AMMONIA in the last 168 hours. Coagulation Profile:  Recent Labs Lab 02/10/17 1745  INR 5.38*    --------------------------------------------------------------------------------------------------------------- Urine analysis:    Component Value Date/Time   COLORURINE YELLOW 11/01/2016 1330   APPEARANCEUR CLOUDY (A) 11/01/2016 1330   LABSPEC 1.013 11/01/2016 1330   PHURINE 7.0 11/01/2016 1330   GLUCOSEU NEGATIVE 11/01/2016 1330   HGBUR SMALL (A) 11/01/2016 1330   BILIRUBINUR NEGATIVE 11/01/2016 1330   KETONESUR NEGATIVE 11/01/2016 1330   PROTEINUR NEGATIVE 11/01/2016 1330   UROBILINOGEN 0.2 12/21/2014 0848   NITRITE NEGATIVE 11/01/2016 1330   LEUKOCYTESUR LARGE (A) 11/01/2016 1330      Imaging Results:    Dg Chest 1 View  Result Date: 02/10/2017 CLINICAL DATA:  Syncope EXAM: CHEST 1 VIEW COMPARISON:  01/20/2017 and prior exam FINDINGS: Cardiomegaly noted. The is a low volume film with mild bibasilar atelectasis. There is no evidence of focal airspace disease, pulmonary edema, suspicious pulmonary nodule/mass, pleural effusion, or pneumothorax.  No acute bony abnormalities are identified. IMPRESSION: Cardiomegaly and mild bibasilar atelectasis. Electronically Signed   By: Margarette Canada M.D.   On: 02/10/2017 19:43   Ct Head Wo Contrast  Result Date: 02/10/2017 CLINICAL DATA:  Syncopal episode today with subsequent facial droop. EXAM: CT HEAD WITHOUT CONTRAST  TECHNIQUE: Contiguous axial images were obtained from the base of the skull through the vertex without intravenous contrast. COMPARISON:  11/01/2016. FINDINGS: Brain: Diffusely enlarged ventricles and subarachnoid spaces. Patchy white matter low density in both cerebral hemispheres. No intracranial hemorrhage, mass lesion or CT evidence of acute infarction. Vascular: No hyperdense vessel or unexpected calcification. Skull: Normal. Negative for fracture or focal lesion. Sinuses/Orbits: Stable left sphenoid sinus mucosal thickening. Other: None. IMPRESSION: 1. No acute abnormality. 2. Stable atrophy, chronic small vessel white matter ischemic changes and chronic sphenoid sinusitis. Electronically Signed   By: Claudie Revering M.D.   On: 02/10/2017 19:19    My personal review of EKG: Rhythm NSR, Prolonged QTc interval 536    Assessment & Plan:    Active Problems:   Syncope   Prolonged QTc interval    Elevated INR      1. Syncope versus hypersomnolence- unclear cause, likely effect of polypharmacy. Dose of fentanyl was changed to 50 g by his wife this morning. Will monitor on telemetric, obtain serial cardiac enzymes, obtain echocardiogram in a.m. Will check orthostatic vital signs every shift. 2. Prolonged QTc interval-EKG shows prolonged QTc interval 526, patient received 2 g magnesium sulfate in the ED. Will repeat EKG in a.m. Monitor closely on telemetry. Potassium is 3.3 which will be replaced. 3. Hypokalemia-potassium is 3.3, will be replaced as above. Follow BMP in a.m. 4. Elevated INR- patient takes Xarelto for atrial fibrillation, INR elevated 5.38. Will hold Xarelto and consult pharmacy for dosing Xarelto. 5. Atrial fibrillation- heart rate is controlled, continue Cardizem, digoxin. Xarelto on hold as above. Will check digoxin level. 6. Chronic pain syndrome-will change the fentanyl patch to  25 g daily, which is his usual dose, continue Vicodin when necessary. 7. CAD-stable, continue  Lipitor, Xarelto is on hold as above. 8. History of COPD-stable, continue when necessary albuterol/ipratropium nebulizer.    DVT Prophylaxis-   Xarelto  AM Labs Ordered, also please review Full Orders  Family Communication: Admission, patients condition and plan of care including tests being ordered have been discussed with the patient and his wife at bedside who indicate understanding and agree with the plan and Code Status.  Code Status:  Full code  Admission status: Observation    Time spent in minutes : 60 minutes   Jirah Rider S M.D on 02/10/2017 at 9:27 PM  Between 7am to 7pm - Pager - [phone]. After 7pm go to [email or web address] - password [facility]  [facility] - Office  [phone]

## 2017-02-10 NOTE — ED Notes (Signed)
Report to Judie Petit, RN on 300

## 2017-02-10 NOTE — ED Triage Notes (Signed)
LKW 1620.  Was on the way home from doctor and pt had a syncopal episode .  Per family noticed facial droop and pt slumped over.

## 2017-02-10 NOTE — ED Provider Notes (Signed)
[facility] DEPT Provider Note   CSN: [id number] Arrival date & time: 02/10/17  1652     History   Chief Complaint Chief Complaint  Patient presents with  . Loss of Consciousness    HPI Bryce Berry is a 76 y.o. male.  The history is provided by a relative and a caregiver.  Loss of Consciousness   This is a new problem. The current episode started less than 1 hour ago. The problem occurs constantly. The problem has been resolved. He lost consciousness for a period of 1 to 5 minutes. The problem is associated with normal activity. Associated symptoms include focal weakness (l facial droop). Pertinent negatives include back pain, fever, nausea, seizures, slurred speech and visual change.    Past Medical History:  Diagnosis Date  . Atrial fibrillation ([place])   . Atrial flutter ([place])    Possible history in the past - details unclear  . Bilateral pulmonary embolism ([place])    a. 10/2012 - V:Q scan high prob for Bilat PE in setting of dyspnea/hemoptysis-->anticoagulation started.  Marland Kitchen BPH (benign prostatic hyperplasia)   . CAD (coronary artery disease)    Stent PCI in [place] and also  in the past - details not clear  . Chronic anticoagulation   . Chronic pain   . 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 COPD (chronic obstructive pulmonary disease) ([place])   . Depression   . Dyslipidemia   . Essential hypertension   . GERD (gastroesophageal reflux disease)   . GI bleed   . Gout   . Hyperlipidemia    Mixed  . Irritable bowel   . Osteoarthritis   . Osteoporosis   . Peripheral arterial disease ([place])    Dopplers Dr.Vyas office October, 2010, apparent occlusion anterior tibial arteries bilaterally., findings suggest hemodynamically significant stenosis of the proximal right provisional femoral artery and the mid left superficial artery.  Ankle-brachial indices indicate moderate stenosis within both lower extremities  . Posttraumatic stress disorder   .  Recurrent UTI (urinary tract infection)   . Right leg DVT ([place])    a. 10/2012 - subocclusive DVT R popliteal vein.  . Wheelchair bound     Patient Active Problem List   Diagnosis Date Noted  . Mild dementia 09/01/2015  . Generalized weakness 07/30/2015  . Acute encephalopathy 07/29/2015  . UTI (lower urinary tract infection) 07/29/2015  . PE (pulmonary embolism) 01/03/2015  . Pulmonary embolism ([place]) 01/03/2015  . Acute blood loss anemia 12/24/2014  . Clostridium difficile colitis 12/22/2014  . Diverticulitis of large intestine without perforation or abscess with bleeding   . Diverticulitis 12/21/2014  . Melena 12/21/2014  . Diarrhea 12/16/2014  . Hypotension 12/16/2014  . HX: anticoagulation 11/02/2013  . Right leg DVT ([place])   . Bilateral pulmonary embolism ([place])   . Ejection fraction   . Anemia 03/01/2012  . History of lower GI bleeding 11/01/2011  . Gastroparesis 07/13/2011  . Constipation 07/13/2011  . Atrial fibrillation ([place])   . Renal insufficiency   . COPD (chronic obstructive pulmonary disease) ([place])   . Posttraumatic stress disorder   . GERD (gastroesophageal reflux disease)   . Hypertension   . Dyslipidemia   . CAD (coronary artery disease)   . Atrial flutter ([place])   . Gout   . Peripheral arterial disease ([place])   . Chronic kidney disease   . Atrial flutter ([place]) 06/20/2009  . Peripheral vascular disease ([place]) 06/20/2009  . Hyperlipidemia 05/09/2009  . Essential hypertension 05/09/2009  .  OTH COMPLICATIONS DUE INTERNAL JOINT PROSTHESIS 04/01/2009  . 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 SHOULDER PAIN 09/25/2007  . HIP PAIN 09/25/2007  . BACK PAIN 09/25/2007  . IMPINGEMENT SYNDROME 09/25/2007    Past Surgical History:  Procedure Laterality Date  . [place]  . CATARACT EXTRACTION    . COLON RESECTION    . COLONOSCOPY N/A 03/13/2013   Procedure: COLONOSCOPY;  Surgeon: Rogene Houston, MD;  Location: [facility];   Service: Endoscopy;  Laterality: N/A;  730  . COLONOSCOPY N/A 12/22/2014   Procedure: COLONOSCOPY;  Surgeon: Rogene Houston, MD;  Location: [facility];  Service: Endoscopy;  Laterality: N/A;  . CORONARY ANGIOPLASTY WITH STENT PLACEMENT     x 4  . GIVENS CAPSULE STUDY N/A 12/24/2014   Procedure: GIVENS CAPSULE STUDY;  Surgeon: Rogene Houston, MD;  Location: [facility];  Service: Endoscopy;  Laterality: N/A;  . KNEE ARTHROPLASTY     Right  . PROSTATE SURGERY    . TOTAL HIP ARTHROPLASTY     Right-complicated hx  . TOTAL KNEE ARTHROPLASTY     Left       Home Medications    Prior to Admission medications   Medication Sig Start Date End Date Taking? Authorizing Provider  albuterol (PROVENTIL) (2.5 MG/3ML) 0.083% nebulizer solution  01/11/17   [provider]  allopurinol (ZYLOPRIM) 100 MG tablet Take 100 mg by mouth every evening.     [provider]  ANORO ELLIPTA 62.5-25 MCG/INH AEPB Inhale 1 puff into the lungs daily. 04/19/16   [provider]  ascorbic acid (VITAMIN C) 500 MG tablet Take 500 mg by mouth daily.     [provider]  atorvastatin (LIPITOR) 80 MG tablet Take 80 mg by mouth every evening.     [provider]  benzonatate (TESSALON) 100 MG capsule  01/06/17   [provider]  calcium-vitamin D (OSCAL WITH D) 500-200 MG-UNIT per tablet Take 1 tablet by mouth daily.     [provider]  cilostazol (PLETAL) 100 MG tablet Take 1 tablet by mouth daily. 07/07/15   [provider]  COLCRYS 0.6 MG tablet Take 1 tablet by mouth daily as needed (gout pain).  07/07/15   [provider]  diclofenac sodium (VOLTAREN) 1 % GEL Apply 1 application topically daily. Applied to feet 05/20/15   [provider]  digoxin (LANOXIN) 0.125 MG tablet Take 1 tablet (0.125 mg total) by mouth daily. 04/15/16   Satira Sark, MD  diltiazem (CARDIZEM CD) 120 MG 24 hr capsule Take 1 capsule (120 mg total) by  mouth daily. As directed based off blood pressure readings 04/15/16   Satira Sark, MD  fenofibrate (TRICOR) 145 MG tablet Take 1 tablet (145 mg total) by mouth daily. Patient taking differently: Take 145 mg by mouth every evening.  08/16/11   de Stanford Scotland, MD  fentaNYL (DURAGESIC - DOSED MCG/HR) 75 MCG/HR Place 75 mcg onto the skin every 3 (three) days. Change patch every 3 days 08/27/15   [provider]  ferrous sulfate 325 (65 FE) MG tablet Take 325 mg by mouth daily with breakfast.    [provider]  finasteride (PROSCAR) 5 MG tablet Take 5 mg by mouth daily.      [provider]  fluticasone (FLONASE) 50 MCG/ACT nasal spray Place 2 sprays into both nostrils daily.    [provider]  furosemide (  LASIX) 40 MG tablet Take 40 mg by mouth every evening. *May take 80mg  as needed for swelling    [provider]  HYDROcodone-acetaminophen (NORCO) 7.5-325 MG tablet Take 1 tablet by mouth every 4 (four) hours as needed for moderate pain. 01/26/16   Carole Civil, MD  ipratropium-albuterol (DUONEB) 0.5-2.5 (3) MG/3ML SOLN Take 3 mLs by nebulization 3 (three) times daily. 01/20/17   Mannam, Hart Robinsons, MD  lidocaine (LIDODERM) 5 % Place 1 patch onto the skin daily as needed (for pain). Every 12 hours as needed 08/29/15   [provider]  magnesium oxide (MAG-OX) 400 (241.3 MG) MG tablet Take 1 tablet (400 mg total) by mouth 2 (two) times daily. 01/06/15   Reyne Dumas, MD  metoprolol (LOPRESSOR) 100 MG tablet Take 1 tablet (100 mg total) by mouth 2 (two) times daily. 02/07/12   Serpe, Burna Forts, PA-C  metroNIDAZOLE (FLAGYL) 500 MG tablet Take 1 tablet (500 mg total) by mouth 2 (two) times daily. 11/05/16   Setzer, Rona Ravens, NP  Multiple Vitamin (MULTIVITAMIN) tablet Take 1 tablet by mouth daily.      [provider]  nitroGLYCERIN (NITROSTAT) 0.4 MG SL tablet Place 1 tablet (0.4 mg total) under the tongue every 5 (five) minutes x 3 doses as  needed. 01/07/15   Carlena Bjornstad, MD  Omega-3 Fatty Acids (FISH OIL) 1000 MG CAPS Take 2 capsules by mouth daily.     [provider]  pantoprazole (PROTONIX) 40 MG tablet Take 40 mg by mouth 2 (two) times daily.     [provider]  potassium chloride SA (K-DUR,KLOR-CON) 20 MEQ tablet Take 2 tablets (40 mEq total) by mouth daily. Patient taking differently: Take 20 mEq by mouth daily. *Give additional tablet if Lasix is increased due to swelling 01/05/15   Reyne Dumas, MD  PROAIR HFA 108 (90 Base) MCG/ACT inhaler Inhale 2 puffs into the lungs 2 (two) times daily as needed for wheezing or shortness of breath. 2 puffs twice daily 08/20/15   [provider]  psyllium (METAMUCIL SMOOTH TEXTURE) 28 % packet Take 1 packet by mouth at bedtime. 03/13/13   Rogene Houston, MD  rivaroxaban (XARELTO) 20 MG TABS tablet Take 1 tablet (20 mg total) by mouth daily. 01/27/15   Reyne Dumas, MD  rivastigmine (EXELON) 9.5 mg/24hr APPLY 1 PATCH DAILY. 12/29/16   Cameron Sprang, MD  tamsulosin (FLOMAX) 0.4 MG CAPS capsule Take 0.4 mg by mouth daily after supper.    [provider]  vancomycin (VANCOCIN) 125 MG capsule Take 1 capsule (125 mg total) by mouth 4 (four) times daily. 12/08/16   Setzer, Rona Ravens, NP    Family History Family History  Problem Relation Age of Onset  . Diabetes Daughter   . Hypertension Daughter   . Obesity Daughter   . Cancer Maternal Grandmother     Social History Social History  Substance Use Topics  . Smoking status: Former Smoker    Packs/day: 1.00    Years: 30.00    Types: Cigarettes    Start date: 08/31/1956    Quit date: 08/17/2003  . Smokeless tobacco: Never Used     Comment: Patient smoked a pack a day for about 30 years  . Alcohol use No     Allergies   Omeprazole; Other; Penicillins; Sulfonamide derivatives; and Iodine   Review of Systems Review of Systems  Unable to perform ROS: Other  Constitutional: Negative for fever.    Cardiovascular: Positive for  syncope.  Gastrointestinal: Negative for nausea.  Musculoskeletal: Negative for back pain.  Neurological: Positive for focal weakness (l facial droop). Negative for seizures.     Physical Exam Updated Vital Signs BP 90/69   Pulse (!) 101   Resp 14   Ht 6\' 2"  (1.88 m)   Wt 124.7 kg (275 lb)   SpO2 98%   BMI 35.31 kg/m   Physical Exam  Constitutional: He appears well-developed and well-nourished.  HENT:  Head: Normocephalic and atraumatic.  Eyes: Conjunctivae and EOM are normal.  Neck: Normal range of motion.  Cardiovascular: Normal rate.   Pulmonary/Chest: Effort normal. No respiratory distress.  Abdominal: Soft. He exhibits no distension.  Musculoskeletal: Normal range of motion.  Neurological: He is alert.  Decreased strength in right arm compared to left which is also 4/5.  Able to move toes but not able to left leg off of wheelchair EOM's normal, pupils equal round and reactive, able to lift eyebrows and smile symettrically Normal sensation in face Normal palatal elevation  Skin: Skin is warm and dry.  Nursing note and vitals reviewed.    ED Treatments / Results  Labs (all labs ordered are listed, but only abnormal results are displayed) Labs Reviewed  BASIC METABOLIC PANEL - Abnormal; Notable for the following:       Result Value   Potassium 3.3 (*)    Glucose, Bld 169 (*)    All other components within normal limits  CBC WITH DIFFERENTIAL/PLATELET - Abnormal; Notable for the following:    WBC 13.4 (*)    RBC 3.78 (*)    Hemoglobin 12.0 (*)    HCT 36.0 (*)    RDW 18.5 (*)    Neutro Abs 11.7 (*)    All other components within normal limits  PROTIME-INR - Abnormal; Notable for the following:    Prothrombin Time 50.7 (*)    INR 5.38 (*)    All other components within normal limits  URINALYSIS, ROUTINE W REFLEX MICROSCOPIC  CBG MONITORING, ED    EKG  EKG Interpretation  Date/Time:  Thursday February 10 2017 17:32:55  EDT Ventricular Rate:  95 PR Interval:    QRS Duration: 109 QT Interval:  426 QTC Calculation: 536 R Axis:   30 Text Interpretation:  Sinus rhythm Short PR interval Probable anterior infarct, age indeterminate Prolonged QT interval ios new Confirmed by Merrily Pew [phone]) on 02/10/2017 6:46:59 PM       Radiology Dg Chest 1 View  Result Date: 02/10/2017 CLINICAL DATA:  Syncope EXAM: CHEST 1 VIEW COMPARISON:  01/20/2017 and prior exam FINDINGS: Cardiomegaly noted. The is a low volume film with mild bibasilar atelectasis. There is no evidence of focal airspace disease, pulmonary edema, suspicious pulmonary nodule/mass, pleural effusion, or pneumothorax. No acute bony abnormalities are identified. IMPRESSION: Cardiomegaly and mild bibasilar atelectasis. Electronically Signed   By: Margarette Canada M.D.   On: 02/10/2017 19:43   Ct Head Wo Contrast  Result Date: 02/10/2017 CLINICAL DATA:  Syncopal episode today with subsequent facial droop. EXAM: CT HEAD WITHOUT CONTRAST TECHNIQUE: Contiguous axial images were obtained from the base of the skull through the vertex without intravenous contrast. COMPARISON:  11/01/2016. FINDINGS: Brain: Diffusely enlarged ventricles and subarachnoid spaces. Patchy white matter low density in both cerebral hemispheres. No intracranial hemorrhage, mass lesion or CT evidence of acute infarction. Vascular: No hyperdense vessel or unexpected calcification. Skull: Normal. Negative for fracture or focal lesion. Sinuses/Orbits: Stable left sphenoid sinus mucosal thickening. Other: None. IMPRESSION: 1. No  acute abnormality. 2. Stable atrophy, chronic small vessel white matter ischemic changes and chronic sphenoid sinusitis. Electronically Signed   By: Claudie Revering M.D.   On: 02/10/2017 19:19    Procedures Procedures (including critical care time)  Medications Ordered in ED Medications  potassium chloride 10 mEq in 100 mL IVPB (not administered)  magnesium sulfate IVPB 2 g 50  mL (not administered)  lactated ringers bolus 1,000 mL (not administered)     Initial Impression / Assessment and Plan / ED Course  I have reviewed the triage vital signs and the nursing notes.  Pertinent labs & imaging results that were available during my care of the patient were reviewed by me and considered in my medical decision making (see chart for details).     76 year old male with past medical history of coronary artery disease, atrial fibrillation, atrial flutter bilateral PEs, GI bleed, COPD who presents to the emergency department today secondary to a syncopal episode last 3 or 4 minutes that was without prodrome. Was back to his normal self relatively quickly was brought here for further evaluation. Here he is at his baseline.   his EKG shows a prolonged QT but is a poor tracing. Potassium slightly low and will replete magnesium as well.  He has many risk factors for a cardiac cause for his syncope so we'll plan for admission to the hospital for monitoring on tele and possible echo. He is supratherapeutic on his INR but no active bleeding so we'll defer to hospitalist for further management of that.   Final Clinical Impressions(s) / ED Diagnoses   Final diagnoses:  Loss of consciousness [facility])    New Prescriptions New Prescriptions   No medications on file     Freeland Pracht, Corene Cornea, MD 02/12/17 [phone]

## 2017-02-10 NOTE — ED Notes (Signed)
CRITICAL VALUE ALERT  Critical Value:  INR  Date & Time Notied:  02/10/17 1908  Provider Notified: [place]  Orders Received/Actions taken: MD made aware.

## 2017-02-10 NOTE — ED Notes (Signed)
Pt does not have pacemaker, therefore interrogation cannot be performed.

## 2017-02-11 ENCOUNTER — Observation Stay (HOSPITAL_BASED_OUTPATIENT_CLINIC_OR_DEPARTMENT_OTHER): Payer: Medicare Other

## 2017-02-11 DIAGNOSIS — J449 Chronic obstructive pulmonary disease, unspecified: Secondary | ICD-10-CM | POA: Diagnosis not present

## 2017-02-11 DIAGNOSIS — R9431 Abnormal electrocardiogram [ECG] [EKG]: Secondary | ICD-10-CM | POA: Diagnosis not present

## 2017-02-11 DIAGNOSIS — I129 Hypertensive chronic kidney disease with stage 1 through stage 4 chronic kidney disease, or unspecified chronic kidney disease: Secondary | ICD-10-CM | POA: Diagnosis not present

## 2017-02-11 DIAGNOSIS — R55 Syncope and collapse: Secondary | ICD-10-CM | POA: Diagnosis not present

## 2017-02-11 DIAGNOSIS — N189 Chronic kidney disease, unspecified: Secondary | ICD-10-CM | POA: Diagnosis not present

## 2017-02-11 DIAGNOSIS — G894 Chronic pain syndrome: Secondary | ICD-10-CM | POA: Diagnosis not present

## 2017-02-11 DIAGNOSIS — I10 Essential (primary) hypertension: Secondary | ICD-10-CM | POA: Diagnosis not present

## 2017-02-11 DIAGNOSIS — F0390 Unspecified dementia without behavioral disturbance: Secondary | ICD-10-CM

## 2017-02-11 DIAGNOSIS — I2510 Atherosclerotic heart disease of native coronary artery without angina pectoris: Secondary | ICD-10-CM | POA: Diagnosis not present

## 2017-02-11 DIAGNOSIS — Z87891 Personal history of nicotine dependence: Secondary | ICD-10-CM | POA: Diagnosis not present

## 2017-02-11 DIAGNOSIS — I481 Persistent atrial fibrillation: Secondary | ICD-10-CM | POA: Diagnosis not present

## 2017-02-11 DIAGNOSIS — Z96651 Presence of right artificial knee joint: Secondary | ICD-10-CM | POA: Diagnosis not present

## 2017-02-11 DIAGNOSIS — Z79899 Other long term (current) drug therapy: Secondary | ICD-10-CM | POA: Diagnosis not present

## 2017-02-11 DIAGNOSIS — I480 Paroxysmal atrial fibrillation: Secondary | ICD-10-CM | POA: Diagnosis not present

## 2017-02-11 DIAGNOSIS — R4182 Altered mental status, unspecified: Secondary | ICD-10-CM | POA: Diagnosis not present

## 2017-02-11 LAB — TROPONIN I
TROPONIN I: 0.03 ng/mL — AB (ref <0.03)
TROPONIN I: 0.03 ng/mL — AB (ref <0.03)
Troponin I: <0.03 ng/mL (ref <0.03)

## 2017-02-11 LAB — COMPREHENSIVE METABOLIC PANEL
ALBUMIN: 2.2 g/dL — AB (ref 3.5–5.0)
ALT: 24 U/L (ref 17–63)
ANION GAP: 9 (ref 5–15)
AST: 28 U/L (ref 15–41)
Alkaline Phosphatase: 57 U/L (ref 38–126)
BILIRUBIN TOTAL: 1.2 mg/dL (ref 0.3–1.2)
BUN: 19 mg/dL (ref 6–20)
CALCIUM: 8.8 mg/dL — AB (ref 8.9–10.3)
CO2: 26 mmol/L (ref 22–32)
Chloride: 100 mmol/L — ABNORMAL LOW (ref 101–111)
Creatinine, Ser: 0.91 mg/dL (ref 0.61–1.24)
GFR calc non Af Amer: >60 mL/min (ref >60)
GLUCOSE: 149 mg/dL — AB (ref 65–99)
POTASSIUM: 3.7 mmol/L (ref 3.5–5.1)
SODIUM: 135 mmol/L (ref 135–145)
TOTAL PROTEIN: 5.1 g/dL — AB (ref 6.5–8.1)

## 2017-02-11 LAB — CBC
HCT: 35.1 % — ABNORMAL LOW (ref 39.0–52.0)
Hemoglobin: 11.9 g/dL — ABNORMAL LOW (ref 13.0–17.0)
MCH: 31.6 pg (ref 26.0–34.0)
MCHC: 33.9 g/dL (ref 30.0–36.0)
MCV: 93.4 fL (ref 78.0–100.0)
Platelets: 150 10*3/uL (ref 150–400)
RBC: 3.76 MIL/uL — ABNORMAL LOW (ref 4.22–5.81)
RDW: 18.3 % — AB (ref 11.5–15.5)
WBC: 10.1 10*3/uL (ref 4.0–10.5)

## 2017-02-11 LAB — ECHOCARDIOGRAM COMPLETE
HEIGHTINCHES: 74 in
Weight: 4402.15 oz

## 2017-02-11 LAB — MRSA PCR SCREENING: MRSA by PCR: POSITIVE — AB

## 2017-02-11 LAB — GLUCOSE, CAPILLARY: GLUCOSE-CAPILLARY: 174 mg/dL — AB (ref 65–99)

## 2017-02-11 LAB — DIGOXIN LEVEL: DIGOXIN LVL: 1.8 ng/mL (ref 0.8–2.0)

## 2017-02-11 LAB — MAGNESIUM: Magnesium: 1.2 mg/dL — ABNORMAL LOW (ref 1.7–2.4)

## 2017-02-11 MED ORDER — CHLORHEXIDINE GLUCONATE CLOTH 2 % EX PADS
6.0000 | MEDICATED_PAD | Freq: Every day | CUTANEOUS | Status: DC
  Administered 2017-02-11 – 2017-02-12 (×2): 6 via TOPICAL

## 2017-02-11 MED ORDER — MUPIROCIN 2 % EX OINT
1.0000 "application " | TOPICAL_OINTMENT | Freq: Two times a day (BID) | CUTANEOUS | Status: DC
  Administered 2017-02-11 – 2017-02-12 (×3): 1 via NASAL
  Filled 2017-02-11 (×2): qty 22

## 2017-02-11 MED ORDER — POTASSIUM CHLORIDE CRYS ER 20 MEQ PO TBCR
40.0000 meq | EXTENDED_RELEASE_TABLET | ORAL | Status: AC
  Administered 2017-02-11 (×2): 40 meq via ORAL
  Filled 2017-02-11 (×2): qty 2

## 2017-02-11 MED ORDER — MAGNESIUM SULFATE 4 GM/100ML IV SOLN
4.0000 g | Freq: Once | INTRAVENOUS | Status: AC
  Administered 2017-02-11: 4 g via INTRAVENOUS
  Filled 2017-02-11: qty 100

## 2017-02-11 MED ORDER — RIVAROXABAN 20 MG PO TABS
20.0000 mg | ORAL_TABLET | Freq: Every day | ORAL | Status: DC
  Administered 2017-02-11 – 2017-02-12 (×2): 20 mg via ORAL
  Filled 2017-02-11 (×2): qty 1

## 2017-02-11 NOTE — Progress Notes (Signed)
CRITICAL VALUE ALERT  Critical Value:  MRSA PCR positive  Date & Time Notied:  02/11/17 0415  Provider Notified: n/a  Orders Received/Actions taken: Standing orders put in per protocol for positive MRSA PCR

## 2017-02-11 NOTE — Progress Notes (Signed)
Patient's wife would like to inquire as to why patient is not receiving Lasix, as patient usually takes Lasix once a day.  Also would like to know why we are not using Diclofenac gel to apply on patient's feet for gout.  Would also like to know about Lidocaine patch for localized pain to the shoulders.    Instructed patient's wife that we could address these questions in the morning, when Dr. Roderic Palau makes rounds, as it may difficult to get these from pharmacy tonight.

## 2017-02-11 NOTE — Care Management Obs Status (Signed)
[facility] NOTIFICATION   Patient Details  Name: Bryce Berry MRN: [id number] Date of Birth: 1941/05/10   Medicare Observation Status Notification Given:  Yes    Joanthan Hlavacek, Chauncey Reading, RN 02/11/2017, 11:45 AM

## 2017-02-11 NOTE — Progress Notes (Signed)
PROGRESS NOTE    Bryce Berry  NWG:[id number] DOB: 01-Oct-1940 DOA: 02/10/2017 PCP: Glenda Chroman, MD    Brief Narrative:  76 year old male with history of atrial fibrillation, mild dementia, chronic wheelchair-bound status, presents to the hospital after a syncopal episode.   Assessment & Plan:   Active Problems:   Hyperlipidemia   SHOULDER PAIN   Atrial fibrillation (HCC)   COPD (chronic obstructive pulmonary disease) (HCC)   Hypertension   HX: anticoagulation   Mild dementia   Syncope   1. Syncope. Patient presented to the hospital with an episode of syncope. Etiology is unclear. Possibly related to prolonged QT interval noted on EKG. Echocardiogram was unremarkable. No signs of UTI. No significant change in medications. He's not had any further symptoms since being in the hospital. He is a mild elevation of troponin with unclear significance. This was discussed with Dr. Domenic Polite was unable to further workup was necessary at this time. Upon discharge, patient may benefit from a 30 day monitor. 2. Prolonged QT interval. Noted to be hypomagnesemic and mildly hypokalemic. These are being replaced. Digoxin level was checked and noted to be normal range. Recheck EKG in a.m. 3. Atrial fibrillation, paroxysmal. Currently in sinus rhythm. Continue rate control with Cardizem and metoprolol. Also on digoxin. Anticoagulated with Xarelto . 4. COPD . stable. No evidence of wheezing. 5. Chronic pain syndrome. Continue on fentanyl patch. 6. Mild dementia. Appears to be at baseline.   DVT prophylaxis: xarelto Code Status: full code Family Communication: discussed with wife at the bedside Disposition Plan: discharge home once improved   Consultants:     Procedures:  Echo: - Mild LVH with LVEF 65-70%. Indeterminate diastolic function.   Mildly calcified mitral annulus with trivial mitral   regurgitation. Sclerotic aortic valve with reduced non-coronary   cusp excursion, no  significant aortic regurgitation. Trivial    tricuspid regurgitation.  Antimicrobials:       Subjective: No chest pain or shortness of breath  Objective: Vitals:   02/11/17 0803 02/11/17 1008 02/11/17 1345 02/11/17 1446  BP:   (!) 120/56   Pulse:  85 84   Resp:   18   Temp:   98.9 F (37.2 C)   TempSrc:   Oral   SpO2: 96%  98% 98%  Weight:      Height:        Intake/Output Summary (Last 24 hours) at 02/11/17 1739 Last data filed at 02/11/17 1500  Gross per 24 hour  Intake           448.67 ml  Output                0 ml  Net           448.67 ml   Filed Weights   02/10/17 1704 02/10/17 2339  Weight: 124.7 kg (275 lb) 124.8 kg (275 lb 2.2 oz)    Examination:  General exam: Appears calm and comfortable  Respiratory system: Clear to auscultation. Respiratory effort normal. Cardiovascular system: S1 & S2 heard, RRR. No JVD, murmurs, rubs, gallops or clicks. No pedal edema. Gastrointestinal system: Abdomen is nondistended, soft and nontender. No organomegaly or masses felt. Normal bowel sounds heard. Central nervous system: No focal neurological deficits. Extremities: Symmetric 5 x 5 power in upper extremities. Skin: No rashes, lesions or ulcers Psychiatry: confused    Data Reviewed: I have personally reviewed following labs and imaging studies  CBC:  Recent Labs Lab 02/10/17 1741 02/11/17 0744  WBC 13.4*  10.1  NEUTROABS 11.7*  --   HGB 12.0* 11.9*  HCT 36.0* 35.1*  MCV 95.2 93.4  PLT 160 093   Basic Metabolic Panel:  Recent Labs Lab 02/10/17 1741 02/11/17 0744  NA 135 135  K 3.3* 3.7  CL 101 100*  CO2 23 26  GLUCOSE 169* 149*  BUN 17 19  CREATININE 0.99 0.91  CALCIUM 8.9 8.8*  MG  --  1.2*   GFR: Estimated Creatinine Clearance: 99.9 mL/min (by C-G formula based on SCr of 0.91 mg/dL). Liver Function Tests:  Recent Labs Lab 02/11/17 0744  AST 28  ALT 24  ALKPHOS 57  BILITOT 1.2  PROT 5.1*  ALBUMIN 2.2*   No results for input(s):  LIPASE, AMYLASE in the last 168 hours. No results for input(s): AMMONIA in the last 168 hours. Coagulation Profile:  Recent Labs Lab 02/10/17 1745  INR 5.38*   Cardiac Enzymes:  Recent Labs Lab 02/11/17 0055 02/11/17 0744 02/11/17 1120  TROPONINI <0.03 0.03* 0.03*   BNP (last 3 results) No results for input(s): PROBNP in the last 8760 hours. HbA1C: No results for input(s): HGBA1C in the last 72 hours. CBG:  Recent Labs Lab 02/10/17 1743  GLUCAP 174*   Lipid Profile: No results for input(s): CHOL, HDL, LDLCALC, TRIG, CHOLHDL, LDLDIRECT in the last 72 hours. Thyroid Function Tests: No results for input(s): TSH, T4TOTAL, FREET4, T3FREE, THYROIDAB in the last 72 hours. Anemia Panel: No results for input(s): VITAMINB12, FOLATE, FERRITIN, TIBC, IRON, RETICCTPCT in the last 72 hours. Sepsis Labs: No results for input(s): PROCALCITON, LATICACIDVEN in the last 168 hours.  Recent Results (from the past 240 hour(s))  MRSA PCR Screening     Status: Abnormal   Collection Time: 02/11/17  1:16 AM  Result Value Ref Range Status   MRSA by PCR POSITIVE (A) NEGATIVE Final    Comment:        The GeneXpert MRSA Assay (FDA approved for NASAL specimens only), is one component of a comprehensive MRSA colonization surveillance program. It is not intended to diagnose MRSA infection nor to guide or monitor treatment for MRSA infections. RESULT CALLED TO, READ BACK BY AND VERIFIED WITH: BOLLUCK,T AT [place] ON 6.29.2018 BY ISLEY,B          Radiology Studies: Dg Chest 1 View  Result Date: 02/10/2017 CLINICAL DATA:  Syncope EXAM: CHEST 1 VIEW COMPARISON:  01/20/2017 and prior exam FINDINGS: Cardiomegaly noted. The is a low volume film with mild bibasilar atelectasis. There is no evidence of focal airspace disease, pulmonary edema, suspicious pulmonary nodule/mass, pleural effusion, or pneumothorax. No acute bony abnormalities are identified. IMPRESSION: Cardiomegaly and mild bibasilar  atelectasis. Electronically Signed   By: Margarette Canada M.D.   On: 02/10/2017 19:43   Ct Head Wo Contrast  Result Date: 02/10/2017 CLINICAL DATA:  Syncopal episode today with subsequent facial droop. EXAM: CT HEAD WITHOUT CONTRAST TECHNIQUE: Contiguous axial images were obtained from the base of the skull through the vertex without intravenous contrast. COMPARISON:  11/01/2016. FINDINGS: Brain: Diffusely enlarged ventricles and subarachnoid spaces. Patchy white matter low density in both cerebral hemispheres. No intracranial hemorrhage, mass lesion or CT evidence of acute infarction. Vascular: No hyperdense vessel or unexpected calcification. Skull: Normal. Negative for fracture or focal lesion. Sinuses/Orbits: Stable left sphenoid sinus mucosal thickening. Other: None. IMPRESSION: 1. No acute abnormality. 2. Stable atrophy, chronic small vessel white matter ischemic changes and chronic sphenoid sinusitis. Electronically Signed   By: Claudie Revering M.D.   On: 02/10/2017  19:19        Scheduled Meds: . allopurinol  100 mg Oral QPM  . atorvastatin  80 mg Oral QPM  . calcium-vitamin D  1 tablet Oral Daily  . Chlorhexidine Gluconate Cloth  6 each Topical Q0600  . cilostazol  100 mg Oral Daily  . digoxin  0.125 mg Oral Daily  . diltiazem  120 mg Oral Daily  . fentaNYL  25 mcg Transdermal Q72H  . finasteride  5 mg Oral Daily  . fluticasone  2 spray Each Nare Daily  . ipratropium-albuterol  3 mL Nebulization TID  . magnesium oxide  400 mg Oral BID  . metoprolol tartrate  100 mg Oral BID  . multivitamin with minerals  1 tablet Oral Daily  . mupirocin ointment  1 application Nasal BID  . pantoprazole  40 mg Oral BID  . potassium chloride  40 mEq Oral Q3H  . rivaroxaban  20 mg Oral Q supper  . rivastigmine  9.5 mg Transdermal Daily  . tamsulosin  0.4 mg Oral QPC supper  . ascorbic acid  500 mg Oral Daily   Continuous Infusions: . sodium chloride 10 mL/hr at 02/11/17 0008  . magnesium sulfate        LOS: 0 days    Time spent: 34mins    MEMON,JEHANZEB, MD [facility] Pager [phone]  If 7PM-7AM, please contact night-coverage [email or web address] Password [facility] 02/11/2017, 5:39 PM

## 2017-02-11 NOTE — Progress Notes (Signed)
CRITICAL VALUE ALERT  Critical Value:  EKG -Critical test result: Long QTc  Date & Time Notied:  02/11/17 0520  Provider Notified: Dr. Darrick Meigs  Orders Received/Actions taken: New orders given and placed into [facility] to be carried out

## 2017-02-11 NOTE — Care Management Note (Signed)
Case Management Note  Patient Details  Name: Bryce Berry MRN: [id number] Date of Birth: Jan 30, 1941  Subjective/Objective:  Adm with syncopal episode. From home with with, WC bound. Has [facility] aide sevices 6 days a week for 2 hours. Also has [facility] for RN and OT services.                   Action/Plan: Plans to return home with resumption of [facility] services. Romualdo Bolk of [facility] aware of OBS status.    Expected Discharge Date:       02/11/2017           Expected Discharge Plan:  [place]  In-House Referral:     Discharge planning Services  CM Consult  Post Acute Care Choice:  Home Health, Resumption of Svcs/PTA Provider Choice offered to:     DME Arranged:    DME Agency:     HH Arranged:  RN, OT [place] Agency:  [facility]  Status of Service:  In process, will continue to follow  If discussed at Long Length of Stay Meetings, dates discussed:    Additional Comments:  Enas Winchel, Chauncey [place], RN 02/11/2017, 12:09 PM

## 2017-02-11 NOTE — Progress Notes (Signed)
*  PRELIMINARY RESULTS* Echocardiogram 2D Echocardiogram has been performed.  Leavy Cella 02/11/2017, 10:29 AM

## 2017-02-11 NOTE — Progress Notes (Addendum)
ANTICOAGULATION CONSULT NOTE - Initial Consult  Pharmacy Consult for [facility] Indication: atrial fibrillation  Allergies  Allergen Reactions  . Omeprazole Other (See Comments)    Reaction unknown-possible upset stomach  . Other     Paper tape  . Penicillins Other (See Comments)    Has patient had a PCN reaction causing immediate rash, facial/tongue/throat swelling, SOB or lightheadedness with hypotension: No Has patient had a PCN reaction causing severe rash involving mucus membranes or skin necrosis: No Has patient had a PCN reaction that required hospitalization No Has patient had a PCN reaction occurring within the last 10 years: No If all of the above answers are "NO", then may proceed with Cephalosporin use.    Rapid heart rate; flushed  . Sulfonamide Derivatives Other (See Comments)    Rapid heart rate; flushed  . Iodine Rash    Patient Measurements: Height: 6\' 2"  (188 cm) Weight: 275 lb 2.2 oz (124.8 kg) IBW/kg (Calculated) : 82.2  Vital Signs: Temp: 99 F (37.2 C) (06/29 0600) Temp Source: Oral (06/29 0600) BP: 120/50 (06/29 0600) Pulse Rate: 101 (06/29 0600)  Labs:  Recent Labs  02/10/17 1741 02/10/17 1745 02/11/17 0055 02/11/17 0744  HGB 12.0*  --   --  11.9*  HCT 36.0*  --   --  35.1*  PLT 160  --   --  150  LABPROT  --  50.7*  --   --   INR  --  5.38*  --   --   CREATININE 0.99  --   --   --   TROPONINI  --   --  <0.03  --     Estimated Creatinine Clearance: 91.9 mL/min (by C-G formula based on SCr of 0.99 mg/dL).   Medical History: Past Medical History:  Diagnosis Date  . Atrial fibrillation ([place])   . Atrial flutter ([place])    Possible history in the past - details unclear  . Bilateral pulmonary embolism ([place])    a. 10/2012 - V:Q scan high prob for Bilat PE in setting of dyspnea/hemoptysis-->anticoagulation started.  Marland Kitchen BPH (benign prostatic hyperplasia)   . CAD (coronary artery disease)    Stent PCI in [place] and also [place] in the past -  details not clear  . Chronic anticoagulation   . Chronic pain   . 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 COPD (chronic obstructive pulmonary disease) ([place])   . Depression   . Dyslipidemia   . Essential hypertension   . GERD (gastroesophageal reflux disease)   . GI bleed   . Gout   . Hyperlipidemia    Mixed  . Irritable bowel   . Osteoarthritis   . Osteoporosis   . Peripheral arterial disease ([place])    Dopplers Dr.Vyas office October, 2010, apparent occlusion anterior tibial arteries bilaterally., findings suggest hemodynamically significant stenosis of the proximal right provisional femoral artery and the mid left superficial artery.  Ankle-brachial indices indicate moderate stenosis within both lower extremities  . Posttraumatic stress disorder   . Recurrent UTI (urinary tract infection)   . Right leg DVT ([place])    a. 10/2012 - subocclusive DVT R popliteal vein.  . Wheelchair bound     Medications:  Prescriptions Prior to Admission  Medication Sig Dispense Refill Last Dose  . albuterol (PROVENTIL) (2.5 MG/3ML) 0.083% nebulizer solution Take 2.5 mg by nebulization every 6 (six) hours as needed for wheezing or shortness of breath.    unknown at Unknown time  . allopurinol (  ZYLOPRIM) 100 MG tablet Take 100 mg by mouth every evening.    02/09/2017 at Unknown time  . ANORO ELLIPTA 62.5-25 MCG/INH AEPB Inhale 1 puff into the lungs daily.   02/10/2017 at Unknown time  . ascorbic acid (VITAMIN C) 500 MG tablet Take 500 mg by mouth daily.    02/10/2017 at Unknown time  . atorvastatin (LIPITOR) 80 MG tablet Take 80 mg by mouth every evening.    02/09/2017 at Unknown time  . benzonatate (TESSALON) 100 MG capsule Take 100 mg by mouth 3 (three) times daily as needed for cough.    Past Week at Unknown time  . calcium-vitamin D (OSCAL WITH D) 500-200 MG-UNIT per tablet Take 1 tablet by mouth daily.    02/10/2017 at Unknown time  . cilostazol (PLETAL) 100 MG tablet Take 1 tablet by  mouth daily.   02/10/2017 at Unknown time  . ciprofloxacin (CIPRO) 500 MG tablet Take 500 mg by mouth 2 (two) times daily. 7 day course prescribed on 01/31/2017   02/10/2017 at Unknown time  . COLCRYS 0.6 MG tablet Take 1 tablet by mouth daily as needed (gout pain).    02/10/2017 at Unknown time  . diclofenac sodium (VOLTAREN) 1 % GEL Apply 1 application topically daily. Applied to feet   02/10/2017 at Unknown time  . digoxin (LANOXIN) 0.125 MG tablet Take 1 tablet (0.125 mg total) by mouth daily. 60 tablet 3 02/10/2017 at Unknown time  . diltiazem (CARDIZEM CD) 120 MG 24 hr capsule Take 1 capsule (120 mg total) by mouth daily. As directed based off blood pressure readings 30 capsule 6 02/10/2017 at Unknown time  . fenofibrate (TRICOR) 145 MG tablet Take 1 tablet (145 mg total) by mouth daily. (Patient taking differently: Take 145 mg by mouth every evening. ) 30 tablet 6 02/09/2017 at Unknown time  . fentaNYL (DURAGESIC - DOSED MCG/HR) 25 MCG/HR patch Place 25 mcg onto the skin every 3 (three) days.   Past Week at Unknown time  . ferrous sulfate 325 (65 FE) MG tablet Take 325 mg by mouth daily with breakfast.   02/10/2017 at Unknown time  . finasteride (PROSCAR) 5 MG tablet Take 5 mg by mouth daily.     02/10/2017 at Unknown time  . fluticasone (FLONASE) 50 MCG/ACT nasal spray Place 2 sprays into both nostrils daily.   02/10/2017 at Unknown time  . furosemide (LASIX) 40 MG tablet Take 40 mg by mouth every evening. *May take 80mg  as needed for swelling   02/09/2017 at Unknown time  . HYDROcodone-acetaminophen (NORCO) 10-325 MG tablet Take 1 tablet by mouth every 6 (six) hours as needed for moderate pain or severe pain.    02/10/2017 at Unknown time  . ipratropium-albuterol (DUONEB) 0.5-2.5 (3) MG/3ML SOLN Take 3 mLs by nebulization 3 (three) times daily. 360 mL 6 02/10/2017 at Unknown time  . lidocaine (LIDODERM) 5 % Place 1 patch onto the skin daily as needed (for pain). Every 12 hours as needed   Past Week at  Unknown time  . magnesium oxide (MAG-OX) 400 (241.3 MG) MG tablet Take 1 tablet (400 mg total) by mouth 2 (two) times daily. 60 tablet 2 02/10/2017 at Unknown time  . metoprolol (LOPRESSOR) 100 MG tablet Take 1 tablet (100 mg total) by mouth 2 (two) times daily. 180 tablet 3 02/10/2017 at [place]  . Multiple Vitamin (MULTIVITAMIN) tablet Take 1 tablet by mouth daily.     02/10/2017 at Unknown time  . nitroGLYCERIN (NITROSTAT) 0.4 MG  SL tablet Place 1 tablet (0.4 mg total) under the tongue every 5 (five) minutes x 3 doses as needed. 25 tablet 3 unknown  . Omega-3 Fatty Acids (FISH OIL) 1000 MG CAPS Take 2 capsules by mouth daily.    02/10/2017 at Unknown time  . pantoprazole (PROTONIX) 40 MG tablet Take 40 mg by mouth 2 (two) times daily.    02/10/2017 at Unknown time  . potassium chloride SA (K-DUR,KLOR-CON) 20 MEQ tablet Take 2 tablets (40 mEq total) by mouth daily. (Patient taking differently: Take 20 mEq by mouth daily. *Give additional tablet if Lasix is increased due to swelling) 30 tablet 0 02/10/2017 at Unknown time  . PROAIR HFA 108 (90 Base) MCG/ACT inhaler Inhale 2 puffs into the lungs 2 (two) times daily as needed for wheezing or shortness of breath. 2 puffs twice daily   Past Week at Unknown time  . rivaroxaban (XARELTO) 20 MG TABS tablet Take 1 tablet (20 mg total) by mouth daily. 30 tablet 10 02/10/2017 at [place]  . rivastigmine (EXELON) 9.5 mg/24hr APPLY 1 PATCH DAILY. 30 patch 0 02/09/2017 at Unknown time  . tamsulosin (FLOMAX) 0.4 MG CAPS capsule Take 0.4 mg by mouth daily after supper.   02/09/2017 at Unknown time    Assessment: 76 y.o. male, With history of atrial fibrillation, pulmonary embolism, depression, CAD, chronic anti-coagulation with Xarelto, COPD, hypertension, chronic wheelchair-bound status, was brought to hospital today after patient lost consciousness this morning. Pharmacy asked to assess anticoagulation with INR 5.38.   Direct factor Xa inhibitors such as rivaroxaban or  apixaban may prolong prothrombin time (PT) and elevate international normalized ratio (INR). However, these tests are not reliable for assessing the anticoagulation effects of these agents. PT assay sensitivity is relatively weak at therapeutic drug concentrations and is subjected to significant variations depending on the reagent used. Conversion of PT to INR may even increase the variability.   Goal of Therapy:  Monitor platelets by anticoagulation protocol: Yes   Plan:  Continue xarelto 20mg  daily Monitor for S/S of bleeding Will signoff, please reconsult if needed  Isac Sarna, BS Vena [place], BCPS Clinical Pharmacist Pager [phone] 02/11/2017,8:29 AM

## 2017-02-12 DIAGNOSIS — R279 Unspecified lack of coordination: Secondary | ICD-10-CM | POA: Diagnosis not present

## 2017-02-12 DIAGNOSIS — J449 Chronic obstructive pulmonary disease, unspecified: Secondary | ICD-10-CM | POA: Diagnosis not present

## 2017-02-12 DIAGNOSIS — G894 Chronic pain syndrome: Secondary | ICD-10-CM | POA: Diagnosis not present

## 2017-02-12 DIAGNOSIS — I480 Paroxysmal atrial fibrillation: Secondary | ICD-10-CM | POA: Diagnosis not present

## 2017-02-12 DIAGNOSIS — R4182 Altered mental status, unspecified: Secondary | ICD-10-CM | POA: Diagnosis not present

## 2017-02-12 DIAGNOSIS — I2510 Atherosclerotic heart disease of native coronary artery without angina pectoris: Secondary | ICD-10-CM | POA: Diagnosis not present

## 2017-02-12 DIAGNOSIS — I481 Persistent atrial fibrillation: Secondary | ICD-10-CM | POA: Diagnosis not present

## 2017-02-12 DIAGNOSIS — R4020 Unspecified coma: Secondary | ICD-10-CM | POA: Diagnosis not present

## 2017-02-12 DIAGNOSIS — Z96651 Presence of right artificial knee joint: Secondary | ICD-10-CM | POA: Diagnosis not present

## 2017-02-12 DIAGNOSIS — Z87891 Personal history of nicotine dependence: Secondary | ICD-10-CM | POA: Diagnosis not present

## 2017-02-12 DIAGNOSIS — F0390 Unspecified dementia without behavioral disturbance: Secondary | ICD-10-CM | POA: Diagnosis not present

## 2017-02-12 DIAGNOSIS — I10 Essential (primary) hypertension: Secondary | ICD-10-CM | POA: Diagnosis not present

## 2017-02-12 DIAGNOSIS — N189 Chronic kidney disease, unspecified: Secondary | ICD-10-CM | POA: Diagnosis not present

## 2017-02-12 DIAGNOSIS — Z79899 Other long term (current) drug therapy: Secondary | ICD-10-CM | POA: Diagnosis not present

## 2017-02-12 DIAGNOSIS — I129 Hypertensive chronic kidney disease with stage 1 through stage 4 chronic kidney disease, or unspecified chronic kidney disease: Secondary | ICD-10-CM | POA: Diagnosis not present

## 2017-02-12 DIAGNOSIS — R9431 Abnormal electrocardiogram [ECG] [EKG]: Secondary | ICD-10-CM | POA: Diagnosis not present

## 2017-02-12 DIAGNOSIS — Z7401 Bed confinement status: Secondary | ICD-10-CM | POA: Diagnosis not present

## 2017-02-12 DIAGNOSIS — R55 Syncope and collapse: Secondary | ICD-10-CM | POA: Diagnosis not present

## 2017-02-12 LAB — BASIC METABOLIC PANEL
Anion gap: 8 (ref 5–15)
BUN: 23 mg/dL — AB (ref 6–20)
CO2: 26 mmol/L (ref 22–32)
CREATININE: 1.01 mg/dL (ref 0.61–1.24)
Calcium: 8.7 mg/dL — ABNORMAL LOW (ref 8.9–10.3)
Chloride: 100 mmol/L — ABNORMAL LOW (ref 101–111)
GFR calc Af Amer: >60 mL/min (ref >60)
GLUCOSE: 146 mg/dL — AB (ref 65–99)
Potassium: 5.1 mmol/L (ref 3.5–5.1)
SODIUM: 134 mmol/L — AB (ref 135–145)

## 2017-02-12 LAB — MAGNESIUM: MAGNESIUM: 2.2 mg/dL (ref 1.7–2.4)

## 2017-02-12 MED ORDER — FENTANYL 25 MCG/HR TD PT72: 25.0000 ug | MEDICATED_PATCH | TRANSDERMAL | Status: DC

## 2017-02-12 MED ORDER — FUROSEMIDE 40 MG PO TABS
40.0000 mg | ORAL_TABLET | Freq: Every evening | ORAL | Status: DC
  Administered 2017-02-12: 40 mg via ORAL
  Filled 2017-02-12: qty 1

## 2017-02-12 MED ORDER — DIPHENHYDRAMINE HCL 25 MG PO CAPS
25.0000 mg | ORAL_CAPSULE | Freq: Four times a day (QID) | ORAL | Status: DC | PRN
  Administered 2017-02-12: 25 mg via ORAL
  Filled 2017-02-12: qty 1

## 2017-02-12 MED ORDER — SODIUM CHLORIDE 0.45 % IV BOLUS
500.0000 mL | Freq: Once | INTRAVENOUS | Status: AC
  Administered 2017-02-12: 500 mL via INTRAVENOUS

## 2017-02-12 MED ORDER — LIDOCAINE 5 % EX PTCH: 1.0000 | MEDICATED_PATCH | Freq: Two times a day (BID) | CUTANEOUS | Status: DC | PRN

## 2017-02-12 NOTE — Progress Notes (Signed)
Patient's wife was very demanding and insisted that patient be given Lasix 40mg .  I explained to the patient's wife that I would page the on call physician if she didn't want to wait til morning for orders for Lasix.  The on call physician was paged, Dr. Vista Lawman, he stated that this problem would need to be addressed in the morning by Dr. Roderic Palau due to sodium, potassium and magnesium lab values being on the low side and physical exam showing lungs clear to auscultation and no edema in bilateral lower extremities.  This information was explained to the patient and his wife.  This information will be passed along in 7am report and to Dr, Roderic Palau.

## 2017-02-12 NOTE — Discharge Summary (Signed)
Physician Discharge Summary  Bryce Berry WJX:[id number] DOB: 08-Apr-1941 DOA: 02/10/2017  PCP: Glenda Chroman, MD  Admit date: 02/10/2017 Discharge date: 02/12/2017  Admitted From: home Disposition:  home  Recommendations for Outpatient Follow-up:  1. Follow up with PCP in 1-2 weeks 2. Please obtain BMP/CBC in one week 3. Arrangements will be made for patient to have 30 day event monitor  Home Health: Equipment/Devices:  Discharge Condition: stable CODE STATUS: full code Diet recommendation: Heart Healthy  Brief/Interim Summary: 76 year old male with history of atrial fibrillation, mild dementia, chronic wheelchair-bound status, presents to the hospital after a syncopal episode.  Discharge Diagnoses:  Active Problems:   Hyperlipidemia   SHOULDER PAIN   Atrial fibrillation (HCC)   COPD (chronic obstructive pulmonary disease) (HCC)   Hypertension   HX: anticoagulation   Mild dementia   Syncope  1. Syncope. Patient presented to the hospital with an episode of syncope. Etiology is unclear. Possibly related to prolonged QT interval noted on EKG. Echocardiogram was unremarkable. No signs of UTI. No significant change in medications. He's not had any further symptoms since being in the hospital. He had a mild elevation of troponin with unclear significance. This was discussed with Dr. Domenic Polite and it was not felt that further workup was necessary at this time. He has no recurrence of symptoms. Arrangements will be made for patient to get a 30 day event monitor and follow up with cardiology.. 2. Prolonged QT interval. Noted to be hypomagnesemic and mildly hypokalemic. These were corrected and follow up EKG showed improvement of QT. Digoxin level was checked and noted to be normal range.  3. Atrial fibrillation, paroxysmal. Currently in sinus rhythm. Continue rate control with Cardizem and metoprolol. Also on digoxin. Anticoagulated with Xarelto . 4. COPD . stable. No evidence of  wheezing. 5. Chronic pain syndrome. Continue on fentanyl patch. 6. Mild dementia. Appears to be at baseline.  Discharge Instructions  Discharge Instructions    Diet - low sodium heart healthy    Complete by:  As directed    Increase activity slowly    Complete by:  As directed      Allergies as of 02/12/2017      Reactions   Omeprazole Other (See Comments)   Reaction unknown-possible upset stomach   Other    Paper tape   Penicillins Other (See Comments)   Has patient had a PCN reaction causing immediate rash, facial/tongue/throat swelling, SOB or lightheadedness with hypotension: No Has patient had a PCN reaction causing severe rash involving mucus membranes or skin necrosis: No Has patient had a PCN reaction that required hospitalization No Has patient had a PCN reaction occurring within the last 10 years: No If all of the above answers are "NO", then may proceed with Cephalosporin use. Rapid heart rate; flushed   Sulfonamide Derivatives Other (See Comments)   Rapid heart rate; flushed   Iodine Rash      Medication List    STOP taking these medications   ciprofloxacin 500 MG tablet Commonly known as:  CIPRO     TAKE these medications   allopurinol 100 MG tablet Commonly known as:  ZYLOPRIM Take 100 mg by mouth every evening.   ANORO ELLIPTA 62.5-25 MCG/INH Aepb Generic drug:  umeclidinium-vilanterol Inhale 1 puff into the lungs daily.   ascorbic acid 500 MG tablet Commonly known as:  VITAMIN C Take 500 mg by mouth daily.   atorvastatin 80 MG tablet Commonly known as:  LIPITOR Take 80 mg by mouth  every evening.   benzonatate 100 MG capsule Commonly known as:  TESSALON Take 100 mg by mouth 3 (three) times daily as needed for cough.   calcium-vitamin D 500-200 MG-UNIT tablet Commonly known as:  OSCAL WITH D Take 1 tablet by mouth daily.   cilostazol 100 MG tablet Commonly known as:  PLETAL Take 1 tablet by mouth daily.   COLCRYS 0.6 MG tablet Generic  drug:  colchicine Take 1 tablet by mouth daily as needed (gout pain).   diclofenac sodium 1 % Gel Commonly known as:  VOLTAREN Apply 1 application topically daily. Applied to feet   digoxin 0.125 MG tablet Commonly known as:  LANOXIN Take 1 tablet (0.125 mg total) by mouth daily.   diltiazem 120 MG 24 hr capsule Commonly known as:  CARDIZEM CD Take 1 capsule (120 mg total) by mouth daily. As directed based off blood pressure readings   fenofibrate 145 MG tablet Commonly known as:  TRICOR Take 1 tablet (145 mg total) by mouth daily. What changed:  when to take this   fentaNYL 25 MCG/HR patch Commonly known as:  [place] - dosed mcg/hr Place 25 mcg onto the skin every 3 (three) days.   ferrous sulfate 325 (65 FE) MG tablet Take 325 mg by mouth daily with breakfast.   finasteride 5 MG tablet Commonly known as:  PROSCAR Take 5 mg by mouth daily.   Fish Oil 1000 MG Caps Take 2 capsules by mouth daily.   fluticasone 50 MCG/ACT nasal spray Commonly known as:  FLONASE Place 2 sprays into both nostrils daily.   furosemide 40 MG tablet Commonly known as:  LASIX Take 40 mg by mouth every evening. *May take 80mg  as needed for swelling   HYDROcodone-acetaminophen 10-325 MG tablet Commonly known as:  NORCO Take 1 tablet by mouth every 6 (six) hours as needed for moderate pain or severe pain.   ipratropium-albuterol 0.5-2.5 (3) MG/3ML Soln Commonly known as:  DUONEB Take 3 mLs by nebulization 3 (three) times daily.   lidocaine 5 % Commonly known as:  LIDODERM Place 1 patch onto the skin daily as needed (for pain). Every 12 hours as needed   magnesium oxide 400 (241.3 Mg) MG tablet Commonly known as:  MAG-OX Take 1 tablet (400 mg total) by mouth 2 (two) times daily.   metoprolol tartrate 100 MG tablet Commonly known as:  LOPRESSOR Take 1 tablet (100 mg total) by mouth 2 (two) times daily.   multivitamin tablet Take 1 tablet by mouth daily.   nitroGLYCERIN 0.4 MG SL  tablet Commonly known as:  NITROSTAT Place 1 tablet (0.4 mg total) under the tongue every 5 (five) minutes x 3 doses as needed.   pantoprazole 40 MG tablet Commonly known as:  PROTONIX Take 40 mg by mouth 2 (two) times daily.   potassium chloride SA 20 MEQ tablet Commonly known as:  K-DUR,KLOR-CON Take 2 tablets (40 mEq total) by mouth daily. What changed:  how much to take  additional instructions   PROAIR HFA 108 (90 Base) MCG/ACT inhaler Generic drug:  albuterol Inhale 2 puffs into the lungs 2 (two) times daily as needed for wheezing or shortness of breath. 2 puffs twice daily   albuterol (2.5 MG/3ML) 0.083% nebulizer solution Commonly known as:  PROVENTIL Take 2.5 mg by nebulization every 6 (six) hours as needed for wheezing or shortness of breath.   rivaroxaban 20 MG Tabs tablet Commonly known as:  XARELTO Take 1 tablet (20 mg total) by mouth daily.  rivastigmine 9.5 mg/24hr Commonly known as:  EXELON APPLY 1 PATCH DAILY.   tamsulosin 0.4 MG Caps capsule Commonly known as:  FLOMAX Take 0.4 mg by mouth daily after supper.       Allergies  Allergen Reactions  . Omeprazole Other (See Comments)    Reaction unknown-possible upset stomach  . Other     Paper tape  . Penicillins Other (See Comments)    Has patient had a PCN reaction causing immediate rash, facial/tongue/throat swelling, SOB or lightheadedness with hypotension: No Has patient had a PCN reaction causing severe rash involving mucus membranes or skin necrosis: No Has patient had a PCN reaction that required hospitalization No Has patient had a PCN reaction occurring within the last 10 years: No If all of the above answers are "NO", then may proceed with Cephalosporin use.    Rapid heart rate; flushed  . Sulfonamide Derivatives Other (See Comments)    Rapid heart rate; flushed  . Iodine Rash    Consultations:     Procedures/Studies: Dg Chest 1 View  Result Date: 02/10/2017 CLINICAL DATA:   Syncope EXAM: CHEST 1 VIEW COMPARISON:  01/20/2017 and prior exam FINDINGS: Cardiomegaly noted. The is a low volume film with mild bibasilar atelectasis. There is no evidence of focal airspace disease, pulmonary edema, suspicious pulmonary nodule/mass, pleural effusion, or pneumothorax. No acute bony abnormalities are identified. IMPRESSION: Cardiomegaly and mild bibasilar atelectasis. Electronically Signed   By: Margarette Canada M.D.   On: 02/10/2017 19:43   Dg Chest 1 View  Result Date: 01/20/2017 CLINICAL DATA:  Chronic cough.  Congestion. EXAM: CHEST 1 VIEW COMPARISON:  Dec 18, 2016 FINDINGS: There is mild bibasilar atelectasis. No edema or consolidation evident. There is stable cardiomegaly with pulmonary vascularity within normal limits. No adenopathy. There is aortic atherosclerosis. There is a presumed small calcified lymph node in the right axillary region, stable. No evident bone lesions. IMPRESSION: Mild bibasilar atelectasis. No edema or consolidation. Stable cardiomegaly. There is aortic atherosclerosis. Electronically Signed   By: Lowella Grip III M.D.   On: 01/20/2017 15:02   Ct Head Wo Contrast  Result Date: 02/10/2017 CLINICAL DATA:  Syncopal episode today with subsequent facial droop. EXAM: CT HEAD WITHOUT CONTRAST TECHNIQUE: Contiguous axial images were obtained from the base of the skull through the vertex without intravenous contrast. COMPARISON:  11/01/2016. FINDINGS: Brain: Diffusely enlarged ventricles and subarachnoid spaces. Patchy white matter low density in both cerebral hemispheres. No intracranial hemorrhage, mass lesion or CT evidence of acute infarction. Vascular: No hyperdense vessel or unexpected calcification. Skull: Normal. Negative for fracture or focal lesion. Sinuses/Orbits: Stable left sphenoid sinus mucosal thickening. Other: None. IMPRESSION: 1. No acute abnormality. 2. Stable atrophy, chronic small vessel white matter ischemic changes and chronic sphenoid sinusitis.  Electronically Signed   By: Claudie Revering M.D.   On: 02/10/2017 19:19    Echo: Mild LVH with LVEF 65-70%. Indeterminate diastolic function.   Mildly calcified mitral annulus with trivial mitral   regurgitation. Sclerotic aortic valve with reduced non-coronary   cusp excursion, no significant aortic regurgitation. Trivial   tricuspid regurgitation.   Subjective: Denies any shortness of breath or pain  Discharge Exam: Vitals:   02/12/17 1035 02/12/17 1036  BP: 119/64   Pulse: (!) 115 (!) 115  Resp:    Temp:     Vitals:   02/12/17 0813 02/12/17 1035 02/12/17 1036 02/12/17 1402  BP:  119/64    Pulse:  (!) 115 (!) 115   Resp:  Temp:      TempSrc:      SpO2: 97%   99%  Weight:      Height:        General: Pt is alert, awake, not in acute distress Cardiovascular: RRR, S1/S2 +, no rubs, no gallops Respiratory: CTA bilaterally, no wheezing, no rhonchi Abdominal: Soft, NT, ND, bowel sounds + Extremities: no edema, no cyanosis    The results of significant diagnostics from this hospitalization (including imaging, microbiology, ancillary and laboratory) are listed below for reference.     Microbiology: Recent Results (from the past 240 hour(s))  MRSA PCR Screening     Status: Abnormal   Collection Time: 02/11/17  1:16 AM  Result Value Ref Range Status   MRSA by PCR POSITIVE (A) NEGATIVE Final    Comment:        The GeneXpert MRSA Assay (FDA approved for NASAL specimens only), is one component of a comprehensive MRSA colonization surveillance program. It is not intended to diagnose MRSA infection nor to guide or monitor treatment for MRSA infections. RESULT CALLED TO, READ BACK BY AND VERIFIED WITH: BOLLUCK,T AT 0415 ON 6.29.2018 BY ISLEY,B      Labs: BNP (last 3 results) No results for input(s): BNP in the last 8760 hours. Basic Metabolic Panel:  Recent Labs Lab 02/10/17 1741 02/11/17 0744 02/12/17 0630  NA 135 135 134*  K 3.3* 3.7 5.1  CL 101 100*  100*  CO2 23 26 26   GLUCOSE 169* 149* 146*  BUN 17 19 23*  CREATININE 0.99 0.91 1.01  CALCIUM 8.9 8.8* 8.7*  MG  --  1.2* 2.2   Liver Function Tests:  Recent Labs Lab 02/11/17 0744  AST 28  ALT 24  ALKPHOS 57  BILITOT 1.2  PROT 5.1*  ALBUMIN 2.2*   No results for input(s): LIPASE, AMYLASE in the last 168 hours. No results for input(s): AMMONIA in the last 168 hours. CBC:  Recent Labs Lab 02/10/17 1741 02/11/17 0744  WBC 13.4* 10.1  NEUTROABS 11.7*  --   HGB 12.0* 11.9*  HCT 36.0* 35.1*  MCV 95.2 93.4  PLT 160 150   Cardiac Enzymes:  Recent Labs Lab 02/11/17 0055 02/11/17 0744 02/11/17 1120  TROPONINI <0.03 0.03* 0.03*   BNP: Invalid input(s): POCBNP CBG:  Recent Labs Lab 02/10/17 1743  GLUCAP 174*   D-Dimer No results for input(s): DDIMER in the last 72 hours. Hgb A1c No results for input(s): HGBA1C in the last 72 hours. Lipid Profile No results for input(s): CHOL, HDL, LDLCALC, TRIG, CHOLHDL, LDLDIRECT in the last 72 hours. Thyroid function studies No results for input(s): TSH, T4TOTAL, T3FREE, THYROIDAB in the last 72 hours.  Invalid input(s): FREET3 Anemia work up No results for input(s): VITAMINB12, FOLATE, FERRITIN, TIBC, IRON, RETICCTPCT in the last 72 hours. Urinalysis    Component Value Date/Time   COLORURINE AMBER (A) 02/10/2017 1811   APPEARANCEUR TURBID (A) 02/10/2017 1811   LABSPEC 1.016 02/10/2017 1811   PHURINE 5.0 02/10/2017 1811   GLUCOSEU NEGATIVE 02/10/2017 1811   HGBUR LARGE (A) 02/10/2017 1811   BILIRUBINUR NEGATIVE 02/10/2017 1811   KETONESUR NEGATIVE 02/10/2017 1811   PROTEINUR 100 (A) 02/10/2017 1811   UROBILINOGEN 0.2 12/21/2014 0848   NITRITE NEGATIVE 02/10/2017 1811   LEUKOCYTESUR MODERATE (A) 02/10/2017 1811   Sepsis Labs Invalid input(s): PROCALCITONIN,  WBC,  LACTICIDVEN Microbiology Recent Results (from the past 240 hour(s))  MRSA PCR Screening     Status: Abnormal   Collection Time: 02/11/17  1:16 AM   Result Value Ref Range Status   MRSA by PCR POSITIVE (A) NEGATIVE Final    Comment:        The GeneXpert MRSA Assay (FDA approved for NASAL specimens only), is one component of a comprehensive MRSA colonization surveillance program. It is not intended to diagnose MRSA infection nor to guide or monitor treatment for MRSA infections. RESULT CALLED TO, READ BACK BY AND VERIFIED WITH: BOLLUCK,T AT 0415 ON 6.29.2018 BY ISLEY,B      Time coordinating discharge: Over 30 minutes  SIGNED:   Kathie Dike, MD  [facility] 02/12/2017, 2:56 PM Pager   If 7PM-7AM, please contact night-coverage [email or web address] Password [email or web address]

## 2017-02-12 NOTE — Progress Notes (Signed)
Pt's IV catheter removed and intact. Pt's IV site clean dry and intact. Discharge instructions including medications and follow up appointments were reviewed and discussed with patient's wife.. All questions were answered and no further questions at this time. Pt's Wife verbalized understanding of discharge instructions. Pt in stable condition and in no acute distress at this time. Pt will be escorted by [facility].

## 2017-02-24 DIAGNOSIS — M130 Polyarthritis, unspecified: Secondary | ICD-10-CM | POA: Diagnosis not present

## 2017-02-24 DIAGNOSIS — Z79891 Long term (current) use of opiate analgesic: Secondary | ICD-10-CM | POA: Diagnosis not present

## 2017-02-24 DIAGNOSIS — M25511 Pain in right shoulder: Secondary | ICD-10-CM | POA: Diagnosis not present

## 2017-02-24 DIAGNOSIS — I10 Essential (primary) hypertension: Secondary | ICD-10-CM | POA: Diagnosis not present

## 2017-02-24 DIAGNOSIS — E785 Hyperlipidemia, unspecified: Secondary | ICD-10-CM | POA: Diagnosis not present

## 2017-02-24 DIAGNOSIS — G894 Chronic pain syndrome: Secondary | ICD-10-CM | POA: Diagnosis not present

## 2017-02-24 DIAGNOSIS — I2782 Chronic pulmonary embolism: Secondary | ICD-10-CM | POA: Diagnosis not present

## 2017-03-04 ENCOUNTER — Ambulatory Visit: Payer: Medicare Other | Admitting: Pulmonary Disease

## 2017-03-16 DIAGNOSIS — Z955 Presence of coronary angioplasty implant and graft: Secondary | ICD-10-CM | POA: Diagnosis not present

## 2017-03-16 DIAGNOSIS — I2510 Atherosclerotic heart disease of native coronary artery without angina pectoris: Secondary | ICD-10-CM | POA: Diagnosis not present

## 2017-03-16 DIAGNOSIS — I10 Essential (primary) hypertension: Secondary | ICD-10-CM | POA: Diagnosis not present

## 2017-03-19 ENCOUNTER — Other Ambulatory Visit: Payer: Self-pay | Admitting: Neurology

## 2017-03-23 DIAGNOSIS — N390 Urinary tract infection, site not specified: Secondary | ICD-10-CM | POA: Diagnosis not present

## 2017-03-23 DIAGNOSIS — M545 Low back pain: Secondary | ICD-10-CM | POA: Diagnosis not present

## 2017-03-23 DIAGNOSIS — I10 Essential (primary) hypertension: Secondary | ICD-10-CM | POA: Diagnosis not present

## 2017-03-23 DIAGNOSIS — I2510 Atherosclerotic heart disease of native coronary artery without angina pectoris: Secondary | ICD-10-CM | POA: Diagnosis not present

## 2017-04-07 DIAGNOSIS — I2782 Chronic pulmonary embolism: Secondary | ICD-10-CM | POA: Diagnosis not present

## 2017-04-07 DIAGNOSIS — G894 Chronic pain syndrome: Secondary | ICD-10-CM | POA: Diagnosis not present

## 2017-04-07 DIAGNOSIS — M130 Polyarthritis, unspecified: Secondary | ICD-10-CM | POA: Diagnosis not present

## 2017-04-07 DIAGNOSIS — I10 Essential (primary) hypertension: Secondary | ICD-10-CM | POA: Diagnosis not present

## 2017-04-07 DIAGNOSIS — Z79891 Long term (current) use of opiate analgesic: Secondary | ICD-10-CM | POA: Diagnosis not present

## 2017-04-07 DIAGNOSIS — E785 Hyperlipidemia, unspecified: Secondary | ICD-10-CM | POA: Diagnosis not present

## 2017-04-07 DIAGNOSIS — M25511 Pain in right shoulder: Secondary | ICD-10-CM | POA: Diagnosis not present

## 2017-04-12 ENCOUNTER — Ambulatory Visit (INDEPENDENT_AMBULATORY_CARE_PROVIDER_SITE_OTHER): Payer: Medicare Other | Admitting: Internal Medicine

## 2017-04-13 ENCOUNTER — Encounter (INDEPENDENT_AMBULATORY_CARE_PROVIDER_SITE_OTHER): Payer: Self-pay | Admitting: Internal Medicine

## 2017-04-21 DIAGNOSIS — Z125 Encounter for screening for malignant neoplasm of prostate: Secondary | ICD-10-CM | POA: Diagnosis not present

## 2017-04-21 DIAGNOSIS — I2699 Other pulmonary embolism without acute cor pulmonale: Secondary | ICD-10-CM | POA: Diagnosis not present

## 2017-04-21 DIAGNOSIS — Z1389 Encounter for screening for other disorder: Secondary | ICD-10-CM | POA: Diagnosis not present

## 2017-04-21 DIAGNOSIS — E7800 Pure hypercholesterolemia, unspecified: Secondary | ICD-10-CM | POA: Diagnosis not present

## 2017-04-21 DIAGNOSIS — Z79899 Other long term (current) drug therapy: Secondary | ICD-10-CM | POA: Diagnosis not present

## 2017-04-21 DIAGNOSIS — I4891 Unspecified atrial fibrillation: Secondary | ICD-10-CM | POA: Diagnosis not present

## 2017-04-21 DIAGNOSIS — I10 Essential (primary) hypertension: Secondary | ICD-10-CM | POA: Diagnosis not present

## 2017-04-21 DIAGNOSIS — Z7189 Other specified counseling: Secondary | ICD-10-CM | POA: Diagnosis not present

## 2017-04-21 DIAGNOSIS — Z299 Encounter for prophylactic measures, unspecified: Secondary | ICD-10-CM | POA: Diagnosis not present

## 2017-04-21 DIAGNOSIS — I2510 Atherosclerotic heart disease of native coronary artery without angina pectoris: Secondary | ICD-10-CM | POA: Diagnosis not present

## 2017-04-21 DIAGNOSIS — J449 Chronic obstructive pulmonary disease, unspecified: Secondary | ICD-10-CM | POA: Diagnosis not present

## 2017-04-21 DIAGNOSIS — Z1211 Encounter for screening for malignant neoplasm of colon: Secondary | ICD-10-CM | POA: Diagnosis not present

## 2017-04-21 DIAGNOSIS — Z0000 Encounter for general adult medical examination without abnormal findings: Secondary | ICD-10-CM | POA: Diagnosis not present

## 2017-04-21 DIAGNOSIS — R5383 Other fatigue: Secondary | ICD-10-CM | POA: Diagnosis not present

## 2017-04-25 ENCOUNTER — Other Ambulatory Visit: Payer: Self-pay | Admitting: *Deleted

## 2017-04-25 MED ORDER — DIGOXIN 125 MCG PO TABS: 0.1250 mg | ORAL_TABLET | Freq: Every day | ORAL | 1 refills | Status: DC

## 2017-05-11 NOTE — Progress Notes (Signed)
**Note De-Identified via Bryce Berry** Cardiology Office Note  Date: 05/12/2017   ID: Bryce Berry, DOB Sep 08, 1940, MRN [id number]  PCP: Glenda Chroman, MD  Primary Cardiologist: Rozann Lesches, MD   Chief Complaint  Patient presents with  . PAF  . Coronary Artery Disease    History of Present Illness: Bryce Berry is a 76 y.o. male that I met back in August 2017, a former patient of Dr. Ron Parker. He was hospitalized back in June at [facility] after an episode of syncope. He was managed on the hospital service. Presenting ECG indicated sinus tachycardia with prolonged PR interval and prolonged QT interval. He had no other specific arrhythmias during observation on telemetry. Troponin I levels were equivocal in the range of 0.03 no chest pain was reported. He had a follow-up echocardiogram as outlined below indicating preserved LVEF. I personally reviewed the ECGs from late June, reported as showing sinus tachycardia with prolonged PR interval, but difficult to exclude an atypical atrial flutter with 2:1 block.  He presents today for a routine follow-up visit with his wife and an Environmental consultant. We discussed his cardiac medications which are outlined below. It sounds like his heart rate has actually been well controlled recently, at 60 or lower. On those occasions his wife holds Lanoxin and also cuts back one or more doses of metoprolol. He has had no dizziness or recurrent syncope.  He also continues on Xarelto for stroke prophylaxis. I reviewed his lab work from June.  Past Medical History:  Diagnosis Date  . Atrial fibrillation (Bryce Berry)   . Atrial flutter (Bryce Berry)    Possible history in the past - details unclear  . Bilateral pulmonary embolism ([place])    a. 10/2012 - V:Q scan high prob for Bilat PE in setting of dyspnea/hemoptysis-->anticoagulation started.  Bryce Berry BPH (benign prostatic hyperplasia)   . CAD (coronary artery disease)    Stent PCI in [place] and also [place] in the past - details not clear  . Chronic anticoagulation   .  Chronic pain   . 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 COPD (chronic obstructive pulmonary disease) ([place])   . Depression   . Dyslipidemia   . Essential hypertension   . GERD (gastroesophageal reflux disease)   . GI bleed   . Gout   . Hyperlipidemia    Mixed  . Irritable bowel   . Osteoarthritis   . Osteoporosis   . Peripheral arterial disease ([place])    Dopplers Dr.Vyas office October, 2010, apparent occlusion anterior tibial arteries bilaterally., findings suggest hemodynamically significant stenosis of the proximal right provisional femoral artery and the mid left superficial artery.  Ankle-brachial indices indicate moderate stenosis within both lower extremities  . Posttraumatic stress disorder   . Recurrent UTI (urinary tract infection)   . Right leg DVT (Bryce Berry)    a. 10/2012 - subocclusive DVT R popliteal vein.  . Wheelchair bound     Past Surgical History:  Procedure Laterality Date  . [place]  . CATARACT EXTRACTION    . COLON RESECTION    . COLONOSCOPY N/A 03/13/2013   Procedure: COLONOSCOPY;  Surgeon: Bryce Houston, MD;  [facility];  Service: Endoscopy;  Laterality: N/A;  730  . COLONOSCOPY N/A 12/22/2014   Procedure: COLONOSCOPY;  Surgeon: Bryce Houston, MD;  Location: [facility];  Service: Endoscopy;  Laterality: N/A;  . CORONARY ANGIOPLASTY WITH STENT PLACEMENT     x 4  .  GIVENS CAPSULE STUDY N/A 12/24/2014   Procedure: GIVENS CAPSULE STUDY;  Surgeon: Bryce Houston, MD;  Location: [facility];  Service: Endoscopy;  Laterality: N/A;  . KNEE ARTHROPLASTY     Right  . PROSTATE SURGERY    . TOTAL HIP ARTHROPLASTY     Right-complicated hx  . TOTAL KNEE ARTHROPLASTY     Left    Current Outpatient Prescriptions  Medication Sig Dispense Refill  . albuterol (PROVENTIL) (2.5 MG/3ML) 0.083% nebulizer solution Take 2.5 mg by nebulization every 6 (six) hours as needed for wheezing or shortness of breath.     .  allopurinol (ZYLOPRIM) 100 MG tablet Take 100 mg by mouth every evening.     Jearl Klinefelter ELLIPTA 62.5-25 MCG/INH AEPB Inhale 1 puff into the lungs daily.    Bryce Berry ascorbic acid (VITAMIN C) 500 MG tablet Take 500 mg by mouth daily.     Bryce Berry atorvastatin (LIPITOR) 80 MG tablet Take 80 mg by mouth every evening.     . benzonatate (TESSALON) 100 MG capsule Take 100 mg by mouth 3 (three) times daily as needed for cough.     . calcium-vitamin D (OSCAL WITH D) 500-200 MG-UNIT per tablet Take 1 tablet by mouth daily.     . cilostazol (PLETAL) 100 MG tablet Take 1 tablet by mouth daily.    Bryce Berry COLCRYS 0.6 MG tablet Take 1 tablet by mouth daily as needed (gout pain).     Bryce Berry diclofenac sodium (VOLTAREN) 1 % GEL Apply 1 application topically daily. Applied to feet    . digoxin (LANOXIN) 0.125 MG tablet Take 1 tablet (0.125 mg total) by mouth daily. 30 tablet 1  . diltiazem (CARDIZEM CD) 120 MG 24 hr capsule Take 1 capsule (120 mg total) by mouth daily. As directed based off blood pressure readings 30 capsule 6  . fenofibrate (TRICOR) 145 MG tablet Take 1 tablet (145 mg total) by mouth daily. (Patient taking differently: Take 145 mg by mouth every evening. ) 30 tablet 6  . fentaNYL (DURAGESIC - DOSED MCG/HR) 25 MCG/HR patch Place 25 mcg onto the skin every 3 (three) days.    . ferrous sulfate 325 (65 FE) MG tablet Take 325 mg by mouth daily with breakfast.    . finasteride (PROSCAR) 5 MG tablet Take 5 mg by mouth daily.      . fluticasone (FLONASE) 50 MCG/ACT nasal spray Place 2 sprays into both nostrils daily.    . furosemide (LASIX) 40 MG tablet Take 40 mg by mouth every evening. *May take '80mg'$  as needed for swelling    . HYDROcodone-acetaminophen (NORCO) 10-325 MG tablet Take 1 tablet by mouth every 6 (six) hours as needed for moderate pain or severe pain.     Bryce Berry ipratropium-albuterol (DUONEB) 0.5-2.5 (3) MG/3ML SOLN Take 3 mLs by nebulization 3 (three) times daily. 360 mL 6  . lidocaine (LIDODERM) 5 % Place 1 patch onto  the skin daily as needed (for pain). Every 12 hours as needed    . magnesium oxide (MAG-OX) 400 (241.3 MG) MG tablet Take 1 tablet (400 mg total) by mouth 2 (two) times daily. 60 tablet 2  . metoprolol (LOPRESSOR) 100 MG tablet Take 1 tablet (100 mg total) by mouth 2 (two) times daily. 180 tablet 3  . Multiple Vitamin (MULTIVITAMIN) tablet Take 1 tablet by mouth daily.      . nitroGLYCERIN (NITROSTAT) 0.4 MG SL tablet Place 1 tablet (0.4 mg total) under the tongue every 5 (five) minutes  x 3 doses as needed. 25 tablet 3  . Omega-3 Fatty Acids (FISH OIL) 1000 MG CAPS Take 2 capsules by mouth daily.     . pantoprazole (PROTONIX) 40 MG tablet Take 40 mg by mouth 2 (two) times daily.     . potassium chloride SA (K-DUR,KLOR-CON) 20 MEQ tablet Take 2 tablets (40 mEq total) by mouth daily. (Patient taking differently: Take 20 mEq by mouth daily. *Give additional tablet if Lasix is increased due to swelling) 30 tablet 0  . PROAIR HFA 108 (90 Base) MCG/ACT inhaler Inhale 2 puffs into the lungs 2 (two) times daily as needed for wheezing or shortness of breath. 2 puffs twice daily    . rivaroxaban (XARELTO) 20 MG TABS tablet Take 1 tablet (20 mg total) by mouth daily. 30 tablet 10  . rivastigmine (EXELON) 9.5 mg/24hr APPLY 1 PATCH DAILY. 90 patch 6  . tamsulosin (FLOMAX) 0.4 MG CAPS capsule Take 0.4 mg by mouth daily after supper.     No current facility-administered medications for this visit.    Allergies:  Omeprazole; Other; Penicillins; Sulfonamide derivatives; and Iodine   Social History: The patient  reports that he quit smoking about 13 years ago. His smoking use included Cigarettes. He started smoking about 60 years ago. He has a 30.00 pack-year smoking history. He has never used smokeless tobacco. He reports that he does not drink alcohol or use drugs.   ROS:  Please see the history of present illness. Otherwise, complete review of systems is positive for limited appetite.  All other systems are  reviewed and negative.   Physical Exam: VS:  BP (!) 142/80   Pulse 72   Ht '6\' 2"'$  (1.88 m)   SpO2 91% , BMI There is no height or weight on file to calculate BMI.  Wt Readings from Last 3 Encounters:  02/10/17 275 lb 2.2 oz (124.8 kg)  01/20/17 274 lb (124.3 kg)  11/05/16 300 lb (136.1 kg)    General: Obese male, appears comfortable at rest. In wheelchair. HEENT: Conjunctiva and lids normal, oropharynx clear. Neck: Supple, no elevated JVP, no carotid bruits, no thyromegaly. Lungs: Clear to auscultation, nonlabored breathing at rest. Cardiac: RRR, no S3 or significant systolic murmur, no pericardial rub. Abdomen: Obese, nontender, bowel sounds present. Extremities: Chronic appearing edema, distal pulses 1-2+. Skin: Warm and dry. Musculoskeletal: No kyphosis. Neuropsychiatric: Alert and oriented x3, affect grossly appropriate.  ECG: I personally reviewed the tracing from 3/90/2018 which showed rate-controlled atrial fibrillation with low voltage and decreased R wave progression.  Recent Labwork: 02/11/2017: ALT 24; AST 28; Hemoglobin 11.9; Platelets 150 02/12/2017: BUN 23; Creatinine, Ser 1.01; Magnesium 2.2; Potassium 5.1; Sodium 134   Other Studies Reviewed Today:  Echocardiogram 02/11/2017: Study Conclusions  - Left ventricle: The cavity size was normal. Wall thickness was   increased in a pattern of mild LVH. Systolic function was   vigorous. The estimated ejection fraction was in the range of 65%   to 70%. Wall motion was normal; there were no regional wall   motion abnormalities. The study is not technically sufficient to   allow evaluation of LV diastolic function. - Aortic valve: Mildly calcified annulus. Trileaflet; mildly   calcified leaflets. Noncoronary cusp mobility was restricted. - Mitral valve: Mildly calcified annulus. There was trivial   regurgitation. - Right atrium: Central venous pressure (est): 3 mm Hg. - Tricuspid valve: There was trivial  regurgitation. - Pulmonary arteries: Systolic pressure could not be accurately   estimated. - Pericardium,  extracardiac: A prominent pericardial fat pad was   present.  Impressions:  - Mild LVH with LVEF 65-70%. Indeterminate diastolic function.   Mildly calcified mitral annulus with trivial mitral   regurgitation. Sclerotic aortic valve with reduced non-coronary   cusp excursion, no significant aortic regurgitation. Trivial   tricuspid regurgitation.  Assessment and Plan:  1. Paroxysmal to persistent atrial fibrillation/flutter. Heart rate is very well controlled today on current regimen. Agree with holding Lanoxin for slow heart rates, otherwise will plan to continue Cardizem CD and metoprolol. He remains on Xarelto for stroke prophylaxis, no reported bleeding problems. Lab work from June is reviewed above.  2. CAD status post previous percutaneous interventions, details not clear. He does not report any angina symptoms at this time. LVEF 65-70% by recent follow-up echocardiogram.  3. Essential hypertension, blood pressure is stable today. No changes made in regimen.  4. History of recurrent DVT and bilateral palmar emboli. He is on long-term anticoagulation.  Current medicines were reviewed with the patient today.  Disposition: Follow-up in 6 months.  Signed, Satira Sark, MD, [facility] 05/12/2017 12:54 PM    [facility], [place], [place] [address] Phone: [phone]; Fax: [phone]

## 2017-05-12 ENCOUNTER — Ambulatory Visit (INDEPENDENT_AMBULATORY_CARE_PROVIDER_SITE_OTHER): Payer: Medicare Other | Admitting: Cardiology

## 2017-05-12 ENCOUNTER — Encounter: Payer: Self-pay | Admitting: Cardiology

## 2017-05-12 VITALS — BP 142/80 | HR 72 | Ht 74.0 in

## 2017-05-12 DIAGNOSIS — I2510 Atherosclerotic heart disease of native coronary artery without angina pectoris: Secondary | ICD-10-CM | POA: Diagnosis not present

## 2017-05-12 DIAGNOSIS — I481 Persistent atrial fibrillation: Secondary | ICD-10-CM | POA: Diagnosis not present

## 2017-05-12 DIAGNOSIS — Z86718 Personal history of other venous thrombosis and embolism: Secondary | ICD-10-CM

## 2017-05-12 DIAGNOSIS — I10 Essential (primary) hypertension: Secondary | ICD-10-CM | POA: Diagnosis not present

## 2017-05-12 DIAGNOSIS — I4819 Other persistent atrial fibrillation: Secondary | ICD-10-CM

## 2017-05-12 NOTE — Patient Instructions (Addendum)

## 2017-05-19 DIAGNOSIS — G8929 Other chronic pain: Secondary | ICD-10-CM | POA: Diagnosis not present

## 2017-05-19 DIAGNOSIS — M25511 Pain in right shoulder: Secondary | ICD-10-CM | POA: Diagnosis not present

## 2017-05-19 DIAGNOSIS — M25512 Pain in left shoulder: Secondary | ICD-10-CM | POA: Diagnosis not present

## 2017-06-09 DIAGNOSIS — M25511 Pain in right shoulder: Secondary | ICD-10-CM | POA: Diagnosis not present

## 2017-06-09 DIAGNOSIS — G894 Chronic pain syndrome: Secondary | ICD-10-CM | POA: Diagnosis not present

## 2017-06-09 DIAGNOSIS — M130 Polyarthritis, unspecified: Secondary | ICD-10-CM | POA: Diagnosis not present

## 2017-06-09 DIAGNOSIS — Z79891 Long term (current) use of opiate analgesic: Secondary | ICD-10-CM | POA: Diagnosis not present

## 2017-06-23 ENCOUNTER — Encounter: Payer: Self-pay | Admitting: Pulmonary Disease

## 2017-06-23 ENCOUNTER — Ambulatory Visit (INDEPENDENT_AMBULATORY_CARE_PROVIDER_SITE_OTHER): Payer: Medicare Other | Admitting: Pulmonary Disease

## 2017-06-23 VITALS — BP 130/72 | HR 100 | Ht 74.0 in | Wt 268.0 lb

## 2017-06-23 DIAGNOSIS — J449 Chronic obstructive pulmonary disease, unspecified: Secondary | ICD-10-CM | POA: Diagnosis not present

## 2017-06-23 DIAGNOSIS — I2510 Atherosclerotic heart disease of native coronary artery without angina pectoris: Secondary | ICD-10-CM | POA: Diagnosis not present

## 2017-06-23 NOTE — Patient Instructions (Signed)
We will schedule you for overnight oximetry to check your oxygen levels at night Continue using your inhalers as prescribed Follow-up in 6 months.

## 2017-06-23 NOTE — Progress Notes (Signed)
Bryce Berry    [id number]    06/23/1941  Primary Care Physician:Vyas, Costella Hatcher, MD  Referring Physician: Glenda Chroman, MD [place], [place] [address]  Chief complaint:  C Follow up for COPD, bronchitis. OSA  HPI: Bryce Berry is a 76 year old with past history of COPD. He has complains of prolonged cough for the past few months associated with greenish sputum production. He has symptoms of dyspnea on exertion, occasional wheezing. He was evaluated at his primary care doctor Dr. Woody Seller and give a course of Levaquin. He states that the symptoms are improved today.  He does not have PFTs on record and has never been seen by pulmonologist. He is currently maintained on an Anoro. He has history of A fib, recurrent DVT, PE and is on long-term Xarelto anticoagulation. He is wheelchair bound from peripheral vascular disease, ankle fracture. He has history of snoring, witnessed apneas.  Interim history: Unable to complete PFTs or spirometry due to problems with coordination.  His breathing has been stable.  He continues on Anoro.  Rarely needs to use his rescue inhaler.   Outpatient Encounter Medications as of 06/23/2017  Medication Sig  . albuterol (PROVENTIL) (2.5 MG/3ML) 0.083% nebulizer solution Take 2.5 mg by nebulization every 6 (six) hours as needed for wheezing or shortness of breath.   . allopurinol (ZYLOPRIM) 100 MG tablet Take 100 mg by mouth every evening.   Jearl Klinefelter ELLIPTA 62.5-25 MCG/INH AEPB Inhale 1 puff into the lungs daily.  Marland Kitchen ascorbic acid (VITAMIN C) 500 MG tablet Take 500 mg by mouth daily.   Marland Kitchen atorvastatin (LIPITOR) 80 MG tablet Take 80 mg by mouth every evening.   . benzonatate (TESSALON) 100 MG capsule Take 100 mg by mouth 3 (three) times daily as needed for cough.   . calcium-vitamin D (OSCAL WITH D) 500-200 MG-UNIT per tablet Take 1 tablet by mouth daily.   . cilostazol (PLETAL) 100 MG tablet Take 1 tablet by mouth daily.  Marland Kitchen COLCRYS 0.6 MG tablet Take 1  tablet by mouth daily as needed (gout pain).   Marland Kitchen diclofenac sodium (VOLTAREN) 1 % GEL Apply 1 application topically daily. Applied to feet  . digoxin (LANOXIN) 0.125 MG tablet Take 1 tablet (0.125 mg total) by mouth daily.  Marland Kitchen diltiazem (CARDIZEM CD) 120 MG 24 hr capsule Take 1 capsule (120 mg total) by mouth daily. As directed based off blood pressure readings  . fenofibrate (TRICOR) 145 MG tablet Take 1 tablet (145 mg total) by mouth daily. (Patient taking differently: Take 145 mg by mouth every evening. )  . fentaNYL (DURAGESIC - DOSED MCG/HR) 25 MCG/HR patch Place 25 mcg onto the skin every 3 (three) days.  . ferrous sulfate 325 (65 FE) MG tablet Take 325 mg by mouth daily with breakfast.  . finasteride (PROSCAR) 5 MG tablet Take 5 mg by mouth daily.    . fluticasone (FLONASE) 50 MCG/ACT nasal spray Place 2 sprays into both nostrils daily.  . furosemide (LASIX) 40 MG tablet Take 40 mg by mouth every evening. *May take 80mg  as needed for swelling  . HYDROcodone-acetaminophen (NORCO) 10-325 MG tablet Take 1 tablet by mouth every 6 (six) hours as needed for moderate pain or severe pain.   Marland Kitchen ipratropium-albuterol (DUONEB) 0.5-2.5 (3) MG/3ML SOLN Take 3 mLs by nebulization 3 (three) times daily.  Marland Kitchen lidocaine (LIDODERM) 5 % Place 1 patch onto the skin daily as needed (for pain). Every 12 hours  as needed  . magnesium oxide (MAG-OX) 400 (241.3 MG) MG tablet Take 1 tablet (400 mg total) by mouth 2 (two) times daily.  . metoprolol (LOPRESSOR) 100 MG tablet Take 1 tablet (100 mg total) by mouth 2 (two) times daily.  . Multiple Vitamin (MULTIVITAMIN) tablet Take 1 tablet by mouth daily.    . nitroGLYCERIN (NITROSTAT) 0.4 MG SL tablet Place 1 tablet (0.4 mg total) under the tongue every 5 (five) minutes x 3 doses as needed.  . Omega-3 Fatty Acids (FISH OIL) 1000 MG CAPS Take 2 capsules by mouth daily.   . pantoprazole (PROTONIX) 40 MG tablet Take 40 mg by mouth 2 (two) times daily.   . potassium chloride SA  (K-DUR,KLOR-CON) 20 MEQ tablet Take 2 tablets (40 mEq total) by mouth daily. (Patient taking differently: Take 20 mEq by mouth daily. *Give additional tablet if Lasix is increased due to swelling)  . PROAIR HFA 108 (90 Base) MCG/ACT inhaler Inhale 2 puffs into the lungs 2 (two) times daily as needed for wheezing or shortness of breath. 2 puffs twice daily  . rivaroxaban (XARELTO) 20 MG TABS tablet Take 1 tablet (20 mg total) by mouth daily.  . rivastigmine (EXELON) 9.5 mg/24hr APPLY 1 PATCH DAILY.  . tamsulosin (FLOMAX) 0.4 MG CAPS capsule Take 0.4 mg by mouth daily after supper.   No facility-administered encounter medications on file as of 06/23/2017.     Allergies as of 06/23/2017 - Review Complete 06/23/2017  Allergen Reaction Noted  . Omeprazole Other (See Comments) 07/29/2015  . Other  07/29/2015  . Penicillins Other (See Comments)   . Sulfonamide derivatives Other (See Comments)   . Iodine Rash     Past Medical History:  Diagnosis Date  . Atrial fibrillation ([place])   . Atrial flutter ([place])    Possible history in the past - details unclear  . Bilateral pulmonary embolism ([place])    a. 10/2012 - V:Q scan high prob for Bilat PE in setting of dyspnea/hemoptysis-->anticoagulation started.  Marland Kitchen BPH (benign prostatic hyperplasia)   . CAD (coronary artery disease)    Stent PCI in [place] and also [place] in the past - details not clear  . Chronic anticoagulation   . Chronic pain   . 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 COPD (chronic obstructive pulmonary disease) ([place])   . Depression   . Dyslipidemia   . Essential hypertension   . GERD (gastroesophageal reflux disease)   . GI bleed   . Gout   . Hyperlipidemia    Mixed  . Irritable bowel   . Osteoarthritis   . Osteoporosis   . Peripheral arterial disease ([place])    Dopplers Dr.Vyas office October, 2010, apparent occlusion anterior tibial arteries bilaterally., findings suggest hemodynamically significant stenosis of  the proximal right provisional femoral artery and the mid left superficial artery.  Ankle-brachial indices indicate moderate stenosis within both lower extremities  . Posttraumatic stress disorder   . Recurrent UTI (urinary tract infection)   . Right leg DVT ([place])    a. 10/2012 - subocclusive DVT R popliteal vein.  . Wheelchair bound     Past Surgical History:  Procedure Laterality Date  . [place]  . CATARACT EXTRACTION    . COLON RESECTION    . CORONARY ANGIOPLASTY WITH STENT PLACEMENT     x 4  . KNEE ARTHROPLASTY     Right  . PROSTATE SURGERY    . TOTAL HIP  ARTHROPLASTY     Right-complicated hx  . TOTAL KNEE ARTHROPLASTY     Left    Family History  Problem Relation Age of Onset  . Diabetes Daughter   . Hypertension Daughter   . Obesity Daughter   . Cancer Maternal Grandmother     Social History   Socioeconomic History  . Marital status: Married    Spouse name: Not on file  . Number of children: Not on file  . Years of education: Not on file  . Highest education level: Not on file  Social Needs  . Financial resource strain: Not on file  . Food insecurity - worry: Not on file  . Food insecurity - inability: Not on file  . Transportation needs - medical: Not on file  . Transportation needs - non-medical: Not on file  Occupational History  . Occupation: Paediatric nurse: RETIRED    Comment: Retired  . Occupation: Vet    Comment: Retired  Tobacco Use  . Smoking status: Former Smoker    Packs/day: 1.00    Years: 30.00    Pack years: 30.00    Types: Cigarettes    Start date: 08/31/1956    Last attempt to quit: 08/17/2003    Years since quitting: 13.8  . Smokeless tobacco: Never Used  . Tobacco comment: Patient smoked a pack a day for about 30 years  Substance and Sexual Activity  . Alcohol use: No    Alcohol/week: 0.0 oz  . Drug use: No  . Sexual activity: Not on file  Other Topics Concern  . Not on file  Social History  Narrative   Lives with wife at home, [place].  Bedbound and mobilized wheelchair.  Two hours 5 days of week of HH aid.   1 child   Retired Geographical information systems officer      Review of systems: Review of Systems  Constitutional: Negative for fever and chills.  HENT: Negative.   Eyes: Negative for blurred vision.  Respiratory: as per HPI  Cardiovascular: Negative for chest pain and palpitations.  Gastrointestinal: Negative for vomiting, diarrhea, blood per rectum. Genitourinary: Negative for dysuria, urgency, frequency and hematuria.  Musculoskeletal: Negative for myalgias, back pain and joint pain.  Skin: Negative for itching and rash.  Neurological: Negative for dizziness, tremors, focal weakness, seizures and loss of consciousness.  Endo/Heme/Allergies: Negative for environmental allergies.  Psychiatric/Behavioral: Negative for depression, suicidal ideas and hallucinations.  All other systems reviewed and are negative.   Physical Exam: Blood pressure 130/72, pulse 100, height 6\' 2"  (1.88 m), weight 268 lb (121.6 kg), SpO2 98 %. Gen:      No acute distress HEENT:  EOMI, sclera anicteric Neck:     No masses; no thyromegaly Lungs:    Clear to auscultation bilaterally; normal respiratory effort CV:         Regular rate and rhythm; no murmurs Abd:      + bowel sounds; soft, non-tender; no palpable masses, no distension Ext:    No edema; adequate peripheral perfusion Skin:      Warm and dry; no rash Neuro: alert and oriented x 3 Psych: normal mood and affect  Data Reviewed: CT scan 01/03/15 > Small LLL PE, b/l lower lobe atelectasis. Images reviewed.  CXR 05/06/16- Stable cardiomegaly, pulmonary venous hypertension.Images reviewed. I have reviewed all images personally.   Echo 10/17/12 - Left ventricle: The cavity size was normal. There was mild concentric hypertrophy. Systolic function was normal. The estimated ejection fraction  was in the range of 55% to 60%. - Mitral valve: Calcified  annulus. - Left atrium: The atrium was mildly to moderately dilated. - Right atrium: The atrium was mildly dilated. - Pulmonary arteries: PA peak pressure: 45mm Hg (S).  Sleep study 06/03/16 Severe OSA, AHI 72.3, O2 sats 83%  CBC/28/18-absolute eosinophil count 100  CPAP download 08/04/16 to 09/02/16 Days greater than 4 hours - 0% Days less than 4 hours - 13% Average use/day- 1 min  Assessment:  #1 Chronic bronchitis, COPD. Stable on Anoro. He is unable to do PFTs or spirometry.    #2 PE, DVT He has history of recurrent PE, DVT. He is on long-term anticoagulation with Xarelto.  #3 Suspected OSA. Severe OSA confirmed on home sleep study. Not tolerant of CPAP. Check overnight oximetry to see if we can at least give him supplemental oxygen at night  Plan/Recommendations: - Continue anoro, albuterol, duonebs PRN - Continue xarelto - Overnight oximetry  Marshell Garfinkel MD [facility] Pager [phone] 06/23/2017, 12:19 PM  CC: Glenda Chroman, MD

## 2017-06-23 NOTE — Progress Notes (Signed)
spiro

## 2017-06-24 ENCOUNTER — Ambulatory Visit: Payer: Medicare Other | Admitting: Pulmonary Disease

## 2017-06-24 ENCOUNTER — Telehealth: Payer: Self-pay | Admitting: Pulmonary Disease

## 2017-06-24 NOTE — Telephone Encounter (Signed)
Patient refuses to use a CPAP so I would not order a CPAP titration study.  Cancel overnight oximetry.  I will discuss with patient at return visit.  Marshell Garfinkel MD  Pulmonary and Critical Care 06/24/2017, 10:40 PM

## 2017-06-24 NOTE — Telephone Encounter (Signed)
Spoke with [place] at [facility]. He stated that [facility] could do the ONO, but it would not qualify the patient for nocturnal O2 due to the OSA diagnosis. Because of the OSA diagnosis, he will need to have a CPAP titration study to show that the CPAP is treating the OSA and if oxygen needs to be blended in. All of these are Medicare guidelines.   PM, please advise if you want to order a CPAP titration on the patient. Thanks!

## 2017-06-30 DIAGNOSIS — I70203 Unspecified atherosclerosis of native arteries of extremities, bilateral legs: Secondary | ICD-10-CM | POA: Diagnosis not present

## 2017-06-30 DIAGNOSIS — B351 Tinea unguium: Secondary | ICD-10-CM | POA: Diagnosis not present

## 2017-06-30 DIAGNOSIS — M79676 Pain in unspecified toe(s): Secondary | ICD-10-CM | POA: Diagnosis not present

## 2017-06-30 DIAGNOSIS — L84 Corns and callosities: Secondary | ICD-10-CM | POA: Diagnosis not present

## 2017-07-05 DIAGNOSIS — G894 Chronic pain syndrome: Secondary | ICD-10-CM | POA: Diagnosis not present

## 2017-07-05 DIAGNOSIS — M6281 Muscle weakness (generalized): Secondary | ICD-10-CM | POA: Diagnosis not present

## 2017-07-05 DIAGNOSIS — M130 Polyarthritis, unspecified: Secondary | ICD-10-CM | POA: Diagnosis not present

## 2017-07-05 DIAGNOSIS — M25511 Pain in right shoulder: Secondary | ICD-10-CM | POA: Diagnosis not present

## 2017-07-05 DIAGNOSIS — J449 Chronic obstructive pulmonary disease, unspecified: Secondary | ICD-10-CM | POA: Diagnosis not present

## 2017-07-05 DIAGNOSIS — I2782 Chronic pulmonary embolism: Secondary | ICD-10-CM | POA: Diagnosis not present

## 2017-07-11 DIAGNOSIS — I2782 Chronic pulmonary embolism: Secondary | ICD-10-CM | POA: Diagnosis not present

## 2017-07-11 DIAGNOSIS — M25511 Pain in right shoulder: Secondary | ICD-10-CM | POA: Diagnosis not present

## 2017-07-11 DIAGNOSIS — M130 Polyarthritis, unspecified: Secondary | ICD-10-CM | POA: Diagnosis not present

## 2017-07-11 DIAGNOSIS — J449 Chronic obstructive pulmonary disease, unspecified: Secondary | ICD-10-CM | POA: Diagnosis not present

## 2017-07-11 DIAGNOSIS — G894 Chronic pain syndrome: Secondary | ICD-10-CM | POA: Diagnosis not present

## 2017-07-11 DIAGNOSIS — M6281 Muscle weakness (generalized): Secondary | ICD-10-CM | POA: Diagnosis not present

## 2017-07-12 DIAGNOSIS — M6281 Muscle weakness (generalized): Secondary | ICD-10-CM | POA: Diagnosis not present

## 2017-07-12 DIAGNOSIS — G894 Chronic pain syndrome: Secondary | ICD-10-CM | POA: Diagnosis not present

## 2017-07-12 DIAGNOSIS — J449 Chronic obstructive pulmonary disease, unspecified: Secondary | ICD-10-CM | POA: Diagnosis not present

## 2017-07-12 DIAGNOSIS — I2782 Chronic pulmonary embolism: Secondary | ICD-10-CM | POA: Diagnosis not present

## 2017-07-12 DIAGNOSIS — M25511 Pain in right shoulder: Secondary | ICD-10-CM | POA: Diagnosis not present

## 2017-07-12 DIAGNOSIS — M130 Polyarthritis, unspecified: Secondary | ICD-10-CM | POA: Diagnosis not present

## 2017-07-15 DIAGNOSIS — M6281 Muscle weakness (generalized): Secondary | ICD-10-CM | POA: Diagnosis not present

## 2017-07-15 DIAGNOSIS — I2782 Chronic pulmonary embolism: Secondary | ICD-10-CM | POA: Diagnosis not present

## 2017-07-15 DIAGNOSIS — G894 Chronic pain syndrome: Secondary | ICD-10-CM | POA: Diagnosis not present

## 2017-07-15 DIAGNOSIS — J449 Chronic obstructive pulmonary disease, unspecified: Secondary | ICD-10-CM | POA: Diagnosis not present

## 2017-07-15 DIAGNOSIS — M25511 Pain in right shoulder: Secondary | ICD-10-CM | POA: Diagnosis not present

## 2017-07-15 DIAGNOSIS — M130 Polyarthritis, unspecified: Secondary | ICD-10-CM | POA: Diagnosis not present

## 2017-07-18 DIAGNOSIS — M6281 Muscle weakness (generalized): Secondary | ICD-10-CM | POA: Diagnosis not present

## 2017-07-18 DIAGNOSIS — J449 Chronic obstructive pulmonary disease, unspecified: Secondary | ICD-10-CM | POA: Diagnosis not present

## 2017-07-18 DIAGNOSIS — M130 Polyarthritis, unspecified: Secondary | ICD-10-CM | POA: Diagnosis not present

## 2017-07-18 DIAGNOSIS — M25511 Pain in right shoulder: Secondary | ICD-10-CM | POA: Diagnosis not present

## 2017-07-18 DIAGNOSIS — I2782 Chronic pulmonary embolism: Secondary | ICD-10-CM | POA: Diagnosis not present

## 2017-07-18 DIAGNOSIS — G894 Chronic pain syndrome: Secondary | ICD-10-CM | POA: Diagnosis not present

## 2017-07-19 DIAGNOSIS — M25511 Pain in right shoulder: Secondary | ICD-10-CM | POA: Diagnosis not present

## 2017-07-19 DIAGNOSIS — J449 Chronic obstructive pulmonary disease, unspecified: Secondary | ICD-10-CM | POA: Diagnosis not present

## 2017-07-19 DIAGNOSIS — M130 Polyarthritis, unspecified: Secondary | ICD-10-CM | POA: Diagnosis not present

## 2017-07-19 DIAGNOSIS — G894 Chronic pain syndrome: Secondary | ICD-10-CM | POA: Diagnosis not present

## 2017-07-19 DIAGNOSIS — I2782 Chronic pulmonary embolism: Secondary | ICD-10-CM | POA: Diagnosis not present

## 2017-07-19 DIAGNOSIS — M6281 Muscle weakness (generalized): Secondary | ICD-10-CM | POA: Diagnosis not present

## 2017-07-22 DIAGNOSIS — J449 Chronic obstructive pulmonary disease, unspecified: Secondary | ICD-10-CM | POA: Diagnosis not present

## 2017-07-22 DIAGNOSIS — M6281 Muscle weakness (generalized): Secondary | ICD-10-CM | POA: Diagnosis not present

## 2017-07-22 DIAGNOSIS — G894 Chronic pain syndrome: Secondary | ICD-10-CM | POA: Diagnosis not present

## 2017-07-22 DIAGNOSIS — I2782 Chronic pulmonary embolism: Secondary | ICD-10-CM | POA: Diagnosis not present

## 2017-07-22 DIAGNOSIS — M25511 Pain in right shoulder: Secondary | ICD-10-CM | POA: Diagnosis not present

## 2017-07-22 DIAGNOSIS — M130 Polyarthritis, unspecified: Secondary | ICD-10-CM | POA: Diagnosis not present

## 2017-07-27 DIAGNOSIS — I2782 Chronic pulmonary embolism: Secondary | ICD-10-CM | POA: Diagnosis not present

## 2017-07-27 DIAGNOSIS — J449 Chronic obstructive pulmonary disease, unspecified: Secondary | ICD-10-CM | POA: Diagnosis not present

## 2017-07-27 DIAGNOSIS — M130 Polyarthritis, unspecified: Secondary | ICD-10-CM | POA: Diagnosis not present

## 2017-07-27 DIAGNOSIS — M25511 Pain in right shoulder: Secondary | ICD-10-CM | POA: Diagnosis not present

## 2017-07-27 DIAGNOSIS — M6281 Muscle weakness (generalized): Secondary | ICD-10-CM | POA: Diagnosis not present

## 2017-07-27 DIAGNOSIS — G894 Chronic pain syndrome: Secondary | ICD-10-CM | POA: Diagnosis not present

## 2017-07-29 DIAGNOSIS — I2782 Chronic pulmonary embolism: Secondary | ICD-10-CM | POA: Diagnosis not present

## 2017-07-29 DIAGNOSIS — J449 Chronic obstructive pulmonary disease, unspecified: Secondary | ICD-10-CM | POA: Diagnosis not present

## 2017-07-29 DIAGNOSIS — G894 Chronic pain syndrome: Secondary | ICD-10-CM | POA: Diagnosis not present

## 2017-07-29 DIAGNOSIS — M6281 Muscle weakness (generalized): Secondary | ICD-10-CM | POA: Diagnosis not present

## 2017-07-29 DIAGNOSIS — M130 Polyarthritis, unspecified: Secondary | ICD-10-CM | POA: Diagnosis not present

## 2017-07-29 DIAGNOSIS — M25511 Pain in right shoulder: Secondary | ICD-10-CM | POA: Diagnosis not present

## 2017-08-03 DIAGNOSIS — G894 Chronic pain syndrome: Secondary | ICD-10-CM | POA: Diagnosis not present

## 2017-08-03 DIAGNOSIS — I2782 Chronic pulmonary embolism: Secondary | ICD-10-CM | POA: Diagnosis not present

## 2017-08-03 DIAGNOSIS — M130 Polyarthritis, unspecified: Secondary | ICD-10-CM | POA: Diagnosis not present

## 2017-08-03 DIAGNOSIS — J449 Chronic obstructive pulmonary disease, unspecified: Secondary | ICD-10-CM | POA: Diagnosis not present

## 2017-08-03 DIAGNOSIS — M6281 Muscle weakness (generalized): Secondary | ICD-10-CM | POA: Diagnosis not present

## 2017-08-03 DIAGNOSIS — M25511 Pain in right shoulder: Secondary | ICD-10-CM | POA: Diagnosis not present

## 2017-08-04 DIAGNOSIS — Z79891 Long term (current) use of opiate analgesic: Secondary | ICD-10-CM | POA: Diagnosis not present

## 2017-08-04 DIAGNOSIS — R2689 Other abnormalities of gait and mobility: Secondary | ICD-10-CM | POA: Diagnosis not present

## 2017-08-04 DIAGNOSIS — M130 Polyarthritis, unspecified: Secondary | ICD-10-CM | POA: Diagnosis not present

## 2017-08-04 DIAGNOSIS — M545 Low back pain: Secondary | ICD-10-CM | POA: Diagnosis not present

## 2017-08-05 DIAGNOSIS — I2782 Chronic pulmonary embolism: Secondary | ICD-10-CM | POA: Diagnosis not present

## 2017-08-05 DIAGNOSIS — J449 Chronic obstructive pulmonary disease, unspecified: Secondary | ICD-10-CM | POA: Diagnosis not present

## 2017-08-05 DIAGNOSIS — M25511 Pain in right shoulder: Secondary | ICD-10-CM | POA: Diagnosis not present

## 2017-08-05 DIAGNOSIS — M130 Polyarthritis, unspecified: Secondary | ICD-10-CM | POA: Diagnosis not present

## 2017-08-05 DIAGNOSIS — M6281 Muscle weakness (generalized): Secondary | ICD-10-CM | POA: Diagnosis not present

## 2017-08-05 DIAGNOSIS — G894 Chronic pain syndrome: Secondary | ICD-10-CM | POA: Diagnosis not present

## 2017-08-12 DIAGNOSIS — J449 Chronic obstructive pulmonary disease, unspecified: Secondary | ICD-10-CM | POA: Diagnosis not present

## 2017-08-12 DIAGNOSIS — M25511 Pain in right shoulder: Secondary | ICD-10-CM | POA: Diagnosis not present

## 2017-08-12 DIAGNOSIS — I2782 Chronic pulmonary embolism: Secondary | ICD-10-CM | POA: Diagnosis not present

## 2017-08-12 DIAGNOSIS — M130 Polyarthritis, unspecified: Secondary | ICD-10-CM | POA: Diagnosis not present

## 2017-08-12 DIAGNOSIS — M6281 Muscle weakness (generalized): Secondary | ICD-10-CM | POA: Diagnosis not present

## 2017-08-12 DIAGNOSIS — G894 Chronic pain syndrome: Secondary | ICD-10-CM | POA: Diagnosis not present

## 2017-09-21 ENCOUNTER — Other Ambulatory Visit: Payer: Self-pay

## 2017-09-21 MED ORDER — DIGOXIN 125 MCG PO TABS: 0.1250 mg | ORAL_TABLET | Freq: Every day | ORAL | 0 refills | Status: DC

## 2017-09-28 ENCOUNTER — Other Ambulatory Visit: Payer: Self-pay | Admitting: Cardiology

## 2017-09-28 MED ORDER — DILTIAZEM HCL ER COATED BEADS 120 MG PO CP24: 120.0000 mg | ORAL_CAPSULE | Freq: Every day | ORAL | 1 refills | Status: DC

## 2017-09-28 NOTE — Telephone Encounter (Signed)
° ° °  1. Which medications need to be refilled? (please list name of each medication and dose if known) diltiazem (CARDIZEM CD) 120 MG 24 hr   2. Which pharmacy/location (including street and city if local pharmacy) is medication to be sent to? [facility]   3. Do they need a 30 day or 90 day supply?

## 2017-09-28 NOTE — Telephone Encounter (Signed)
RX refilled  

## 2017-11-10 ENCOUNTER — Ambulatory Visit: Payer: Medicare Other | Admitting: Cardiology

## 2017-11-28 DIAGNOSIS — R531 Weakness: Secondary | ICD-10-CM | POA: Diagnosis not present

## 2017-11-28 DIAGNOSIS — N3001 Acute cystitis with hematuria: Secondary | ICD-10-CM | POA: Diagnosis not present

## 2017-11-28 DIAGNOSIS — R1084 Generalized abdominal pain: Secondary | ICD-10-CM | POA: Diagnosis not present

## 2017-12-13 DIAGNOSIS — G8929 Other chronic pain: Secondary | ICD-10-CM | POA: Diagnosis not present

## 2017-12-15 DIAGNOSIS — M545 Low back pain: Secondary | ICD-10-CM | POA: Diagnosis not present

## 2017-12-15 DIAGNOSIS — M79673 Pain in unspecified foot: Secondary | ICD-10-CM | POA: Diagnosis not present

## 2017-12-15 DIAGNOSIS — F419 Anxiety disorder, unspecified: Secondary | ICD-10-CM | POA: Diagnosis not present

## 2017-12-20 NOTE — Progress Notes (Deleted)
.    Cardiology Office Note  Date: 12/20/2017   ID: Bryce Berry, DOB [birth year], MRN [id number]  PCP: Glenda Chroman, MD  Primary Cardiologist: Rozann Lesches, MD   No chief complaint on file.   History of Present Illness: Bryce Berry is a 77 y.o. male last seen in September 2018.  Past Medical History:  Diagnosis Date  . Atrial fibrillation ([place])   . Atrial flutter ([place])    Possible history in the past - details unclear  . Bilateral pulmonary embolism ([place])    a. 10/2012 - V:Q scan high prob for Bilat PE in setting of dyspnea/hemoptysis-->anticoagulation started.  Marland Kitchen BPH (benign prostatic hyperplasia)   . CAD (coronary artery disease)    Stent PCI in [place] and also [place] in the past - details not clear  . Chronic anticoagulation   . Chronic pain   . 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 COPD (chronic obstructive pulmonary disease) ([place])   . Depression   . Dyslipidemia   . Essential hypertension   . GERD (gastroesophageal reflux disease)   . GI bleed   . Gout   . Hyperlipidemia    Mixed  . Irritable bowel   . Osteoarthritis   . Osteoporosis   . Peripheral arterial disease ([place])    Dopplers Dr.Vyas office October, 2010, apparent occlusion anterior tibial arteries bilaterally., findings suggest hemodynamically significant stenosis of the proximal right provisional femoral artery and the mid left superficial artery.  Ankle-brachial indices indicate moderate stenosis within both lower extremities  . Posttraumatic stress disorder   . Recurrent UTI (urinary tract infection)   . Right leg DVT ([place])    a. 10/2012 - subocclusive DVT R popliteal vein.  . Wheelchair bound     Past Surgical History:  Procedure Laterality Date  . [address]  . CATARACT EXTRACTION    . COLON RESECTION    . COLONOSCOPY N/A 03/13/2013   Procedure: COLONOSCOPY;  Surgeon: Rogene Houston, MD;  [facility];  Service: Endoscopy;  Laterality:  N/A;  730  . COLONOSCOPY N/A 12/22/2014   Procedure: COLONOSCOPY;  Surgeon: Rogene Houston, MD;  [facility];  Service: Endoscopy;  Laterality: N/A;  . CORONARY ANGIOPLASTY WITH STENT PLACEMENT     x 4  . GIVENS CAPSULE STUDY N/A 12/24/2014   Procedure: GIVENS CAPSULE STUDY;  Surgeon: Rogene Houston, MD;  [facility];  Service: Endoscopy;  Laterality: N/A;  . KNEE ARTHROPLASTY     Right  . PROSTATE SURGERY    . TOTAL HIP ARTHROPLASTY     Right-complicated hx  . TOTAL KNEE ARTHROPLASTY     Left    Current Outpatient Medications  Medication Sig Dispense Refill  . albuterol (PROVENTIL) (2.5 MG/3ML) 0.083% nebulizer solution Take 2.5 mg by nebulization every 6 (six) hours as needed for wheezing or shortness of breath.     . allopurinol (ZYLOPRIM) 100 MG tablet Take 100 mg by mouth every evening.     Jearl Klinefelter ELLIPTA 62.5-25 MCG/INH AEPB Inhale 1 puff into the lungs daily.    Marland Kitchen ascorbic acid (VITAMIN C) 500 MG tablet Take 500 mg by mouth daily.     Marland Kitchen atorvastatin (LIPITOR) 80 MG tablet Take 80 mg by mouth every evening.     . benzonatate (TESSALON) 100 MG capsule Take 100 mg by mouth 3 (three) times daily as needed for cough.     Marland Kitchen  calcium-vitamin D (OSCAL WITH D) 500-200 MG-UNIT per tablet Take 1 tablet by mouth daily.     . cilostazol (PLETAL) 100 MG tablet Take 1 tablet by mouth daily.    Marland Kitchen COLCRYS 0.6 MG tablet Take 1 tablet by mouth daily as needed (gout pain).     Marland Kitchen diclofenac sodium (VOLTAREN) 1 % GEL Apply 1 application topically daily. Applied to feet    . digoxin (LANOXIN) 0.125 MG tablet Take 1 tablet (0.125 mg total) by mouth daily. 15 tablet 0  . diltiazem (CARDIZEM CD) 120 MG 24 hr capsule Take 1 capsule (120 mg total) by mouth daily. As directed based off blood pressure readings 30 capsule 1  . fenofibrate (TRICOR) 145 MG tablet Take 1 tablet (145 mg total) by mouth daily. (Patient taking differently: Take 145 mg by mouth every evening. ) 30 tablet 6  .  fentaNYL (DURAGESIC - DOSED MCG/HR) 25 MCG/HR patch Place 25 mcg onto the skin every 3 (three) days.    . ferrous sulfate 325 (65 FE) MG tablet Take 325 mg by mouth daily with breakfast.    . finasteride (PROSCAR) 5 MG tablet Take 5 mg by mouth daily.      . fluticasone (FLONASE) 50 MCG/ACT nasal spray Place 2 sprays into both nostrils daily.    . furosemide (LASIX) 40 MG tablet Take 40 mg by mouth every evening. *May take 80mg  as needed for swelling    . HYDROcodone-acetaminophen (NORCO) 10-325 MG tablet Take 1 tablet by mouth every 6 (six) hours as needed for moderate pain or severe pain.     Marland Kitchen ipratropium-albuterol (DUONEB) 0.5-2.5 (3) MG/3ML SOLN Take 3 mLs by nebulization 3 (three) times daily. 360 mL 6  . lidocaine (LIDODERM) 5 % Place 1 patch onto the skin daily as needed (for pain). Every 12 hours as needed    . magnesium oxide (MAG-OX) 400 (241.3 MG) MG tablet Take 1 tablet (400 mg total) by mouth 2 (two) times daily. 60 tablet 2  . metoprolol (LOPRESSOR) 100 MG tablet Take 1 tablet (100 mg total) by mouth 2 (two) times daily. 180 tablet 3  . Multiple Vitamin (MULTIVITAMIN) tablet Take 1 tablet by mouth daily.      . nitroGLYCERIN (NITROSTAT) 0.4 MG SL tablet Place 1 tablet (0.4 mg total) under the tongue every 5 (five) minutes x 3 doses as needed. 25 tablet 3  . Omega-3 Fatty Acids (FISH OIL) 1000 MG CAPS Take 2 capsules by mouth daily.     . pantoprazole (PROTONIX) 40 MG tablet Take 40 mg by mouth 2 (two) times daily.     . potassium chloride SA (K-DUR,KLOR-CON) 20 MEQ tablet Take 2 tablets (40 mEq total) by mouth daily. (Patient taking differently: Take 20 mEq by mouth daily. *Give additional tablet if Lasix is increased due to swelling) 30 tablet 0  . PROAIR HFA 108 (90 Base) MCG/ACT inhaler Inhale 2 puffs into the lungs 2 (two) times daily as needed for wheezing or shortness of breath. 2 puffs twice daily    . rivaroxaban (XARELTO) 20 MG TABS tablet Take 1 tablet (20 mg total) by mouth  daily. 30 tablet 10  . rivastigmine (EXELON) 9.5 mg/24hr APPLY 1 PATCH DAILY. 90 patch 6  . tamsulosin (FLOMAX) 0.4 MG CAPS capsule Take 0.4 mg by mouth daily after supper.     No current facility-administered medications for this visit.    Allergies:  Omeprazole; Other; Penicillins; Sulfonamide derivatives; and Iodine   Social History: The patient  reports that he quit smoking about 14 years ago. His smoking use included cigarettes. He started smoking about 61 years ago. He has a 30.00 pack-year smoking history. He has never used smokeless tobacco. He reports that he does not drink alcohol or use drugs.   Family History: The patient's family history includes Cancer in his maternal grandmother; Diabetes in his daughter; Hypertension in his daughter; Obesity in his daughter.   ROS:  Please see the history of present illness. Otherwise, complete review of systems is positive for {NONE DEFAULTED:18576::"none"}.  All other systems are reviewed and negative.   Physical Exam: VS:  There were no vitals taken for this visit., BMI There is no height or weight on file to calculate BMI.  Wt Readings from Last 3 Encounters:  06/23/17 268 lb (121.6 kg)  02/10/17 275 lb 2.2 oz (124.8 kg)  01/20/17 274 lb (124.3 kg)    General: Patient appears comfortable at rest. HEENT: Conjunctiva and lids normal, oropharynx clear with moist mucosa. Neck: Supple, no elevated JVP or carotid bruits, no thyromegaly. Lungs: Clear to auscultation, nonlabored breathing at rest. Cardiac: Regular rate and rhythm, no S3 or significant systolic murmur, no pericardial rub. Abdomen: Soft, nontender, no hepatomegaly, bowel sounds present, no guarding or rebound. Extremities: No pitting edema, distal pulses 2+. Skin: Warm and dry. Musculoskeletal: No kyphosis. Neuropsychiatric: Alert and oriented x3, affect grossly appropriate.  ECG: I personally reviewed the tracing from 02/12/2017 which showed probable sinus tachycardia with  prolonged PR interval, cannot completely exclude atypical atrial flutter with 2:1 block.  Recent Labwork: 02/11/2017: ALT 24; AST 28; Hemoglobin 11.9; Platelets 150 02/12/2017: BUN 23; Creatinine, Ser 1.01; Magnesium 2.2; Potassium 5.1; Sodium 134   Other Studies Reviewed Today:  Echocardiogram 02/11/2017: Study Conclusions  - Left ventricle: The cavity size was normal. Wall thickness was increased in a pattern of mild LVH. Systolic function was vigorous. The estimated ejection fraction was in the range of 65% to 70%. Wall motion was normal; there were no regional wall motion abnormalities. The study is not technically sufficient to allow evaluation of LV diastolic function. - Aortic valve: Mildly calcified annulus. Trileaflet; mildly calcified leaflets. Noncoronary cusp mobility was restricted. - Mitral valve: Mildly calcified annulus. There was trivial regurgitation. - Right atrium: Central venous pressure (est): 3 mm Hg. - Tricuspid valve: There was trivial regurgitation. - Pulmonary arteries: Systolic pressure could not be accurately estimated. - Pericardium, extracardiac: A prominent pericardial fat pad was present.  Impressions:  - Mild LVH with LVEF 65-70%. Indeterminate diastolic function. Mildly calcified mitral annulus with trivial mitral regurgitation. Sclerotic aortic valve with reduced non-coronary cusp excursion, no significant aortic regurgitation. Trivial tricuspid regurgitation.  Assessment and Plan:    Current medicines were reviewed with the patient today.  No orders of the defined types were placed in this encounter.   Disposition:  Signed, Satira Sark, MD, [facility] 12/20/2017 8:43 AM    [facility] at [place], [place], [place] [address] Phone: [phone]; Fax: [phone]

## 2017-12-22 ENCOUNTER — Ambulatory Visit: Payer: Medicare Other | Admitting: Cardiology

## 2017-12-29 ENCOUNTER — Ambulatory Visit: Payer: Medicare Other | Admitting: Pulmonary Disease

## 2018-02-06 ENCOUNTER — Ambulatory Visit (INDEPENDENT_AMBULATORY_CARE_PROVIDER_SITE_OTHER): Payer: Medicare Other | Admitting: Internal Medicine

## 2018-02-09 ENCOUNTER — Ambulatory Visit: Payer: Medicare Other | Admitting: Cardiology

## 2018-02-23 ENCOUNTER — Encounter (INDEPENDENT_AMBULATORY_CARE_PROVIDER_SITE_OTHER): Payer: Self-pay | Admitting: Internal Medicine

## 2018-02-23 ENCOUNTER — Ambulatory Visit (INDEPENDENT_AMBULATORY_CARE_PROVIDER_SITE_OTHER): Payer: Medicare Other | Admitting: Internal Medicine

## 2018-03-15 NOTE — Progress Notes (Deleted)
Cardiology Office Note  Date: 03/15/2018   ID: Bryce Berry, DOB Aug 08, 1941, MRN [id number]  PCP: Glenda Chroman, MD  Primary Cardiologist: Rozann Lesches, MD   No chief complaint on file.   History of Present Illness: Bryce Berry is a 77 y.o. male last seen in September 2018.  Past Medical History:  Diagnosis Date  . Atrial fibrillation ([place])   . Atrial flutter ([place])    Possible history in the past - details unclear  . Bilateral pulmonary embolism ([place])    a. 10/2012 - V:Q scan high prob for Bilat PE in setting of dyspnea/hemoptysis-->anticoagulation started.  Marland Kitchen BPH (benign prostatic hyperplasia)   . CAD (coronary artery disease)    Stent PCI in [place] and also [place] in the past - details not clear  . Chronic anticoagulation   . Chronic pain   . 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 COPD (chronic obstructive pulmonary disease) ([place])   . Depression   . Dyslipidemia   . Essential hypertension   . GERD (gastroesophageal reflux disease)   . GI bleed   . Gout   . Hyperlipidemia    Mixed  . Irritable bowel   . Osteoarthritis   . Osteoporosis   . Peripheral arterial disease ([place])    Dopplers Dr.Vyas office October, 2010, apparent occlusion anterior tibial arteries bilaterally., findings suggest hemodynamically significant stenosis of the proximal right provisional femoral artery and the mid left superficial artery.  Ankle-brachial indices indicate moderate stenosis within both lower extremities  . Posttraumatic stress disorder   . Recurrent UTI (urinary tract infection)   . Right leg DVT ([place])    a. 10/2012 - subocclusive DVT R popliteal vein.  . Wheelchair bound     Past Surgical History:  Procedure Laterality Date  . [place]  . CATARACT EXTRACTION    . COLON RESECTION    . COLONOSCOPY N/A 03/13/2013   Procedure: COLONOSCOPY;  Surgeon: Rogene Houston, MD;  [facility];  Service: Endoscopy;  Laterality:  N/A;  730  . COLONOSCOPY N/A 12/22/2014   Procedure: COLONOSCOPY;  Surgeon: Rogene Houston, MD;  [facility];  Service: Endoscopy;  Laterality: N/A;  . CORONARY ANGIOPLASTY WITH STENT PLACEMENT     x 4  . GIVENS CAPSULE STUDY N/A 12/24/2014   Procedure: GIVENS CAPSULE STUDY;  Surgeon: Rogene Houston, MD;  [facility];  Service: Endoscopy;  Laterality: N/A;  . KNEE ARTHROPLASTY     Right  . PROSTATE SURGERY    . TOTAL HIP ARTHROPLASTY     Right-complicated hx  . TOTAL KNEE ARTHROPLASTY     Left    Current Outpatient Medications  Medication Sig Dispense Refill  . albuterol (PROVENTIL) (2.5 MG/3ML) 0.083% nebulizer solution Take 2.5 mg by nebulization every 6 (six) hours as needed for wheezing or shortness of breath.     . allopurinol (ZYLOPRIM) 100 MG tablet Take 100 mg by mouth every evening.     Jearl Klinefelter ELLIPTA 62.5-25 MCG/INH AEPB Inhale 1 puff into the lungs daily.    Marland Kitchen ascorbic acid (VITAMIN C) 500 MG tablet Take 500 mg by mouth daily.     Marland Kitchen atorvastatin (LIPITOR) 80 MG tablet Take 80 mg by mouth every evening.     . benzonatate (TESSALON) 100 MG capsule Take 100 mg by mouth 3 (three) times daily as needed for cough.     Marland Kitchen  calcium-vitamin D (OSCAL WITH D) 500-200 MG-UNIT per tablet Take 1 tablet by mouth daily.     . cilostazol (PLETAL) 100 MG tablet Take 1 tablet by mouth daily.    Marland Kitchen COLCRYS 0.6 MG tablet Take 1 tablet by mouth daily as needed (gout pain).     Marland Kitchen diclofenac sodium (VOLTAREN) 1 % GEL Apply 1 application topically daily. Applied to feet    . digoxin (LANOXIN) 0.125 MG tablet Take 1 tablet (0.125 mg total) by mouth daily. 15 tablet 0  . diltiazem (CARDIZEM CD) 120 MG 24 hr capsule Take 1 capsule (120 mg total) by mouth daily. As directed based off blood pressure readings 30 capsule 1  . fenofibrate (TRICOR) 145 MG tablet Take 1 tablet (145 mg total) by mouth daily. (Patient taking differently: Take 145 mg by mouth every evening. ) 30 tablet 6  .  fentaNYL (DURAGESIC - DOSED MCG/HR) 25 MCG/HR patch Place 25 mcg onto the skin every 3 (three) days.    . ferrous sulfate 325 (65 FE) MG tablet Take 325 mg by mouth daily with breakfast.    . finasteride (PROSCAR) 5 MG tablet Take 5 mg by mouth daily.      . fluticasone (FLONASE) 50 MCG/ACT nasal spray Place 2 sprays into both nostrils daily.    . furosemide (LASIX) 40 MG tablet Take 40 mg by mouth every evening. *May take 80mg  as needed for swelling    . HYDROcodone-acetaminophen (NORCO) 10-325 MG tablet Take 1 tablet by mouth every 6 (six) hours as needed for moderate pain or severe pain.     Marland Kitchen ipratropium-albuterol (DUONEB) 0.5-2.5 (3) MG/3ML SOLN Take 3 mLs by nebulization 3 (three) times daily. 360 mL 6  . lidocaine (LIDODERM) 5 % Place 1 patch onto the skin daily as needed (for pain). Every 12 hours as needed    . magnesium oxide (MAG-OX) 400 (241.3 MG) MG tablet Take 1 tablet (400 mg total) by mouth 2 (two) times daily. 60 tablet 2  . metoprolol (LOPRESSOR) 100 MG tablet Take 1 tablet (100 mg total) by mouth 2 (two) times daily. 180 tablet 3  . Multiple Vitamin (MULTIVITAMIN) tablet Take 1 tablet by mouth daily.      . nitroGLYCERIN (NITROSTAT) 0.4 MG SL tablet Place 1 tablet (0.4 mg total) under the tongue every 5 (five) minutes x 3 doses as needed. 25 tablet 3  . Omega-3 Fatty Acids (FISH OIL) 1000 MG CAPS Take 2 capsules by mouth daily.     . pantoprazole (PROTONIX) 40 MG tablet Take 40 mg by mouth 2 (two) times daily.     . potassium chloride SA (K-DUR,KLOR-CON) 20 MEQ tablet Take 2 tablets (40 mEq total) by mouth daily. (Patient taking differently: Take 20 mEq by mouth daily. *Give additional tablet if Lasix is increased due to swelling) 30 tablet 0  . PROAIR HFA 108 (90 Base) MCG/ACT inhaler Inhale 2 puffs into the lungs 2 (two) times daily as needed for wheezing or shortness of breath. 2 puffs twice daily    . rivaroxaban (XARELTO) 20 MG TABS tablet Take 1 tablet (20 mg total) by mouth  daily. 30 tablet 10  . rivastigmine (EXELON) 9.5 mg/24hr APPLY 1 PATCH DAILY. 90 patch 6  . tamsulosin (FLOMAX) 0.4 MG CAPS capsule Take 0.4 mg by mouth daily after supper.     No current facility-administered medications for this visit.    Allergies:  Omeprazole; Other; Penicillins; Sulfonamide derivatives; and Iodine   Social History: The patient  reports that he quit smoking about 14 years ago. His smoking use included cigarettes. He started smoking about 61 years ago. He has a 30.00 pack-year smoking history. He has never used smokeless tobacco. He reports that he does not drink alcohol or use drugs.   Family History: The patient's family history includes Cancer in his maternal grandmother; Diabetes in his daughter; Hypertension in his daughter; Obesity in his daughter.   ROS:  Please see the history of present illness. Otherwise, complete review of systems is positive for {NONE DEFAULTED:18576::"none"}.  All other systems are reviewed and negative.   Physical Exam: VS:  There were no vitals taken for this visit., BMI There is no height or weight on file to calculate BMI.  Wt Readings from Last 3 Encounters:  06/23/17 268 lb (121.6 kg)  02/10/17 275 lb 2.2 oz (124.8 kg)  01/20/17 274 lb (124.3 kg)    General: Patient appears comfortable at rest. HEENT: Conjunctiva and lids normal, oropharynx clear with moist mucosa. Neck: Supple, no elevated JVP or carotid bruits, no thyromegaly. Lungs: Clear to auscultation, nonlabored breathing at rest. Cardiac: Regular rate and rhythm, no S3 or significant systolic murmur, no pericardial rub. Abdomen: Soft, nontender, no hepatomegaly, bowel sounds present, no guarding or rebound. Extremities: No pitting edema, distal pulses 2+. Skin: Warm and dry. Musculoskeletal: No kyphosis. Neuropsychiatric: Alert and oriented x3, affect grossly appropriate.  ECG: I personally reviewed the tracing from  11/01/2016 which showed rate-controlled atrial  fibrillation with low voltage and decreased R wave progression.  Recent Labwork:  02/11/2017: ALT 24; AST 28; Hemoglobin 11.9; Platelets 150 02/12/2017: BUN 23; Creatinine, Ser 1.01; Magnesium 2.2; Potassium 5.1; Sodium 134   Other Studies Reviewed Today:  Echocardiogram 02/11/2017: Study Conclusions  - Left ventricle: The cavity size was normal. Wall thickness was increased in a pattern of mild LVH. Systolic function was vigorous. The estimated ejection fraction was in the range of 65% to 70%. Wall motion was normal; there were no regional wall motion abnormalities. The study is not technically sufficient to allow evaluation of LV diastolic function. - Aortic valve: Mildly calcified annulus. Trileaflet; mildly calcified leaflets. Noncoronary cusp mobility was restricted. - Mitral valve: Mildly calcified annulus. There was trivial regurgitation. - Right atrium: Central venous pressure (est): 3 mm Hg. - Tricuspid valve: There was trivial regurgitation. - Pulmonary arteries: Systolic pressure could not be accurately estimated. - Pericardium, extracardiac: A prominent pericardial fat pad was present.  Impressions:  - Mild LVH with LVEF 65-70%. Indeterminate diastolic function. Mildly calcified mitral annulus with trivial mitral regurgitation. Sclerotic aortic valve with reduced non-coronary cusp excursion, no significant aortic regurgitation. Trivial tricuspid regurgitation.  Assessment and Plan:    Current medicines were reviewed with the patient today.  No orders of the defined types were placed in this encounter.   Disposition:  Signed, Satira Sark, MD, [facility] 03/15/2018 11:26 AM    [facility] at [place], [place], [place] [address] Phone: [phone]; Fax: [phone]

## 2018-03-16 ENCOUNTER — Ambulatory Visit: Payer: Medicare Other | Admitting: Cardiology

## 2018-06-08 DIAGNOSIS — L84 Corns and callosities: Secondary | ICD-10-CM | POA: Diagnosis not present

## 2018-06-08 DIAGNOSIS — B351 Tinea unguium: Secondary | ICD-10-CM | POA: Diagnosis not present

## 2018-06-08 DIAGNOSIS — M79676 Pain in unspecified toe(s): Secondary | ICD-10-CM | POA: Diagnosis not present

## 2018-06-08 DIAGNOSIS — I70203 Unspecified atherosclerosis of native arteries of extremities, bilateral legs: Secondary | ICD-10-CM | POA: Diagnosis not present

## 2018-06-14 NOTE — Progress Notes (Signed)
Cardiology Office Note  Date: 06/15/2018   ID: DEVEREAUX Berry, DOB 08-11-1941, MRN [id number]  PCP: Glenda Chroman, MD  Primary Cardiologist: Rozann Lesches, MD   Chief Complaint  Patient presents with  . Atrial Fibrillation     History of Present Illness: Bryce Berry is a 77 y.o. male last seen in September 2018.  He is here today with his wife.  From a cardiac perspective he does not report any obvious angina symptoms with limited physical activity, also no palpitations.  His wife does mention that his heart rate is irregular at times with vital sign checks.  Since I last saw him, medication doses have been adjusted.  His current cardiac regimen includes Cardizem CD, Lanoxin, Lopressor, and Xarelto.  I personally reviewed his ECG today which shows normal sinus rhythm with right bundle branch block.  We plan to request lab work from Dr. Woody Seller.  Patient's wife states that Bryce Berry will be going in for a visit with lab work soon.  He has had no obvious bleeding problems on Xarelto.  Past Medical History:  Diagnosis Date  . Atrial fibrillation ([place])   . Atrial flutter ([place])    Possible history in the past - details unclear  . Bilateral pulmonary embolism ([place])    a. 10/2012 - V:Q scan high prob for Bilat PE in setting of dyspnea/hemoptysis-->anticoagulation started.  Marland Kitchen BPH (benign prostatic hyperplasia)   . CAD (coronary artery disease)    Stent PCI in [place] and also [place] in the past - details not clear  . Chronic anticoagulation   . Chronic pain   . 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 COPD (chronic obstructive pulmonary disease) ([place])   . Depression   . Dyslipidemia   . Essential hypertension   . GERD (gastroesophageal reflux disease)   . GI bleed   . Gout   . Hyperlipidemia    Mixed  . Irritable bowel   . Osteoarthritis   . Osteoporosis   . Peripheral arterial disease ([place])    Dopplers Dr.Vyas office October, 2010, apparent occlusion  anterior tibial arteries bilaterally., findings suggest hemodynamically significant stenosis of the proximal right provisional femoral artery and the mid left superficial artery.  Ankle-brachial indices indicate moderate stenosis within both lower extremities  . Posttraumatic stress disorder   . Recurrent UTI (urinary tract infection)   . Right leg DVT ([place])    a. 10/2012 - subocclusive DVT R popliteal vein.  . Wheelchair bound     Past Surgical History:  Procedure Laterality Date  . [place]  . CATARACT EXTRACTION    . COLON RESECTION    . COLONOSCOPY N/A 03/13/2013   Procedure: COLONOSCOPY;  Surgeon: Rogene Houston, MD;  [facility];  Service: Endoscopy;  Laterality: N/A;  730  . COLONOSCOPY N/A 12/22/2014   Procedure: COLONOSCOPY;  Surgeon: Rogene Houston, MD;  [facility];  Service: Endoscopy;  Laterality: N/A;  . CORONARY ANGIOPLASTY WITH STENT PLACEMENT     x 4  . GIVENS CAPSULE STUDY N/A 12/24/2014   Procedure: GIVENS CAPSULE STUDY;  Surgeon: Rogene Houston, MD;  [facility];  Service: Endoscopy;  Laterality: N/A;  . KNEE ARTHROPLASTY     Right  . PROSTATE SURGERY    . TOTAL HIP ARTHROPLASTY     Right-complicated hx  . TOTAL KNEE ARTHROPLASTY     Left  Current Outpatient Medications  Medication Sig Dispense Refill  . albuterol (PROVENTIL) (2.5 MG/3ML) 0.083% nebulizer solution Take 2.5 mg by nebulization every 6 (six) hours as needed for wheezing or shortness of breath.     . allopurinol (ZYLOPRIM) 100 MG tablet Take 100 mg by mouth every evening.     Jearl Klinefelter ELLIPTA 62.5-25 MCG/INH AEPB Inhale 1 puff into the lungs daily.    Marland Kitchen ascorbic acid (VITAMIN C) 500 MG tablet Take 500 mg by mouth daily.     Marland Kitchen atorvastatin (LIPITOR) 80 MG tablet Take 80 mg by mouth every evening.     . benzonatate (TESSALON) 100 MG capsule Take 100 mg by mouth 3 (three) times daily as needed for cough.     . calcium-vitamin D (OSCAL WITH  D) 500-200 MG-UNIT per tablet Take 1 tablet by mouth daily.     . cilostazol (PLETAL) 100 MG tablet Take 1 tablet by mouth daily.    Marland Kitchen COLCRYS 0.6 MG tablet Take 1 tablet by mouth daily as needed (gout pain).     Marland Kitchen diclofenac sodium (VOLTAREN) 1 % GEL Apply 1 application topically daily. Applied to feet    . digoxin (LANOXIN) 0.125 MG tablet Take 1 tablet (0.125 mg total) by mouth daily. 15 tablet 0  . diltiazem (CARDIZEM CD) 120 MG 24 hr capsule Take 1 capsule (120 mg total) by mouth daily. As directed based off blood pressure readings 30 capsule 1  . fenofibrate (TRICOR) 145 MG tablet Take 1 tablet (145 mg total) by mouth daily. (Patient taking differently: Take 145 mg by mouth every evening. ) 30 tablet 6  . fentaNYL (DURAGESIC - DOSED MCG/HR) 25 MCG/HR patch Place 25 mcg onto the skin every 3 (three) days.    . ferrous sulfate 325 (65 FE) MG tablet Take 325 mg by mouth daily with breakfast.    . finasteride (PROSCAR) 5 MG tablet Take 5 mg by mouth daily.      . fluticasone (FLONASE) 50 MCG/ACT nasal spray Place 2 sprays into both nostrils daily.    . furosemide (LASIX) 40 MG tablet Take 40 mg by mouth every evening. *May take 80mg  as needed for swelling    . HYDROcodone-acetaminophen (NORCO) 10-325 MG tablet Take 1 tablet by mouth every 6 (six) hours as needed for moderate pain or severe pain.     Marland Kitchen ipratropium-albuterol (DUONEB) 0.5-2.5 (3) MG/3ML SOLN Take 3 mLs by nebulization 3 (three) times daily. 360 mL 6  . lidocaine (LIDODERM) 5 % Place 1 patch onto the skin daily as needed (for pain). Every 12 hours as needed    . lisinopril (PRINIVIL,ZESTRIL) 20 MG tablet Take 20 mg by mouth daily. Take one tablet by mouth if blood pressure is over 150    . magnesium oxide (MAG-OX) 400 (241.3 MG) MG tablet Take 1 tablet (400 mg total) by mouth 2 (two) times daily. 60 tablet 2  . metoprolol tartrate (LOPRESSOR) 50 MG tablet Take 50 mg by mouth 2 (two) times daily.    . Multiple Vitamin (MULTIVITAMIN)  tablet Take 1 tablet by mouth daily.      . nitroGLYCERIN (NITROSTAT) 0.4 MG SL tablet Place 1 tablet (0.4 mg total) under the tongue every 5 (five) minutes x 3 doses as needed. 25 tablet 3  . Omega-3 Fatty Acids (FISH OIL) 1000 MG CAPS Take 2 capsules by mouth daily.     . pantoprazole (PROTONIX) 40 MG tablet Take 40 mg by mouth 2 (two) times daily.     Marland Kitchen  potassium chloride SA (K-DUR,KLOR-CON) 20 MEQ tablet Take 2 tablets (40 mEq total) by mouth daily. (Patient taking differently: Take 20 mEq by mouth daily. *Give additional tablet if Lasix is increased due to swelling) 30 tablet 0  . PROAIR HFA 108 (90 Base) MCG/ACT inhaler Inhale 2 puffs into the lungs 2 (two) times daily as needed for wheezing or shortness of breath. 2 puffs twice daily    . rivaroxaban (XARELTO) 20 MG TABS tablet Take 1 tablet (20 mg total) by mouth daily. 30 tablet 10  . rivastigmine (EXELON) 9.5 mg/24hr APPLY 1 PATCH DAILY. 90 patch 6  . tamsulosin (FLOMAX) 0.4 MG CAPS capsule Take 0.4 mg by mouth daily after supper.     No current facility-administered medications for this visit.    Allergies:  Latex; Omeprazole; Other; Penicillins; Sulfonamide derivatives; and Iodine   Social History: The patient  reports that he quit smoking about 14 years ago. His smoking use included cigarettes. He started smoking about 61 years ago. He has a 30.00 pack-year smoking history. He has never used smokeless tobacco. He reports that he does not drink alcohol or use drugs.   ROS:  Please see the history of present illness. Otherwise, complete review of systems is positive for none.  All other systems are reviewed and negative.   Physical Exam: VS:  BP 138/80   Pulse 75   Ht 6\' 2"  (1.88 m)   SpO2 98%   BMI 34.41 kg/m , BMI Body mass index is 34.41 kg/m.  Wt Readings from Last 3 Encounters:  06/23/17 268 lb (121.6 kg)  02/10/17 275 lb 2.2 oz (124.8 kg)  01/20/17 274 lb (124.3 kg)    General: Obese elderly male in wheelchair.   Appears comfortable. HEENT: Conjunctiva and lids normal, oropharynx clear. Neck: Supple, no elevated JVP or carotid bruits, no thyromegaly. Lungs: Clear to auscultation, nonlabored breathing at rest. Cardiac: Regular rate and rhythm, no S3 or significant systolic murmur. Abdomen: Soft, nontender, bowel sounds present. Extremities: Chronic appearing leg edema as before, distal pulses 1+. Skin: Warm and dry. Musculoskeletal: No kyphosis. Neuropsychiatric: Alert and oriented x3, affect grossly appropriate.  ECG: I personally reviewed the tracing from 02/12/2017 which shows sinus tachycardia with prolonged PR interval and low voltage.  Recent Labwork:  02/11/2017: ALT 24; AST 28; Hemoglobin 11.9; Platelets 150 02/12/2017: BUN 23; Creatinine, Ser 1.01; Magnesium 2.2; Potassium 5.1; Sodium 134   Other Studies Reviewed Today:  Echocardiogram 02/11/2017: Study Conclusions  - Left ventricle: The cavity size was normal. Wall thickness was increased in a pattern of mild LVH. Systolic function was vigorous. The estimated ejection fraction was in the range of 65% to 70%. Wall motion was normal; there were no regional wall motion abnormalities. The study is not technically sufficient to allow evaluation of LV diastolic function. - Aortic valve: Mildly calcified annulus. Trileaflet; mildly calcified leaflets. Noncoronary cusp mobility was restricted. - Mitral valve: Mildly calcified annulus. There was trivial regurgitation. - Right atrium: Central venous pressure (est): 3 mm Hg. - Tricuspid valve: There was trivial regurgitation. - Pulmonary arteries: Systolic pressure could not be accurately estimated. - Pericardium, extracardiac: A prominent pericardial fat pad was present.  Impressions:  - Mild LVH with LVEF 65-70%. Indeterminate diastolic function. Mildly calcified mitral annulus with trivial mitral regurgitation. Sclerotic aortic valve with reduced  non-coronary cusp excursion, no significant aortic regurgitation. Trivial tricuspid regurgitation.  Assessment and Plan:  1.  Persistent atrial fibrillation/flutter, currently in sinus rhythm by ECG and reporting no progressive  palpitations.  He continues on rate control strategy as outlined above, beta-blocker dose has been reduced since I saw him.  Otherwise continue Xarelto for stroke prophylaxis.  He reports no spontaneous bleeding problems.  Plan to request interval lab work from PCP.  2.  CAD status post previous PCI with limited details available at this time.  He reports no definite angina symptoms.  LVEF was 65 to 70% as of June 2018.  He is not on aspirin given concurrent use of Xarelto.  Continue statin therapy.  3.  Essential hypertension, blood pressure is adequately controlled today.  4.  History of recurrent DVT and pulmonary emboli.  He is maintained on long-term anticoagulation.  Current medicines were reviewed with the patient today.  Disposition: Follow-up in 1 year, sooner if needed.  Signed, Satira Sark, MD, [facility] 06/15/2018 11:16 AM    [facility] [place], [place], [place] [address] Phone: [phone]; Fax: [phone]

## 2018-06-15 ENCOUNTER — Encounter: Payer: Self-pay | Admitting: Cardiology

## 2018-06-15 ENCOUNTER — Encounter

## 2018-06-15 ENCOUNTER — Ambulatory Visit (INDEPENDENT_AMBULATORY_CARE_PROVIDER_SITE_OTHER): Payer: Medicare Other | Admitting: Cardiology

## 2018-06-15 ENCOUNTER — Encounter: Payer: Self-pay | Admitting: *Deleted

## 2018-06-15 VITALS — BP 138/80 | HR 75 | Ht 74.0 in

## 2018-06-15 DIAGNOSIS — Z86718 Personal history of other venous thrombosis and embolism: Secondary | ICD-10-CM | POA: Diagnosis not present

## 2018-06-15 DIAGNOSIS — I10 Essential (primary) hypertension: Secondary | ICD-10-CM

## 2018-06-15 DIAGNOSIS — I2510 Atherosclerotic heart disease of native coronary artery without angina pectoris: Secondary | ICD-10-CM | POA: Diagnosis not present

## 2018-06-15 DIAGNOSIS — I4819 Other persistent atrial fibrillation: Secondary | ICD-10-CM

## 2018-06-15 NOTE — Patient Instructions (Signed)

## 2018-06-22 DIAGNOSIS — I10 Essential (primary) hypertension: Secondary | ICD-10-CM | POA: Diagnosis not present

## 2018-06-22 DIAGNOSIS — E7800 Pure hypercholesterolemia, unspecified: Secondary | ICD-10-CM | POA: Diagnosis not present

## 2018-07-20 ENCOUNTER — Ambulatory Visit: Payer: TRICARE For Life (TFL) | Admitting: Pulmonary Disease

## 2018-09-05 ENCOUNTER — Ambulatory Visit (INDEPENDENT_AMBULATORY_CARE_PROVIDER_SITE_OTHER): Payer: TRICARE For Life (TFL) | Admitting: Internal Medicine

## 2018-09-05 ENCOUNTER — Encounter (INDEPENDENT_AMBULATORY_CARE_PROVIDER_SITE_OTHER): Payer: Self-pay | Admitting: Internal Medicine

## 2018-10-04 ENCOUNTER — Telehealth: Payer: Self-pay | Admitting: Cardiology

## 2018-10-04 MED ORDER — DIGOXIN 125 MCG PO TABS: 0.1250 mg | ORAL_TABLET | Freq: Every day | ORAL | 0 refills | Status: DC

## 2018-10-04 NOTE — Telephone Encounter (Signed)
Done

## 2018-10-04 NOTE — Telephone Encounter (Signed)
°*  STAT* If patient is at the pharmacy, call can be transferred to refill team.   1. Which medications need to be refilled? digoxin (LANOXIN) 0.125 MG tablet    2. Which pharmacy/location (including street and city if local pharmacy) is medication to be sent to? [facility]   3. Do they need a 30 day or 90 day supply? - requesting about 5 pills    - have not received meds from [place] yet

## 2018-11-14 ENCOUNTER — Ambulatory Visit (INDEPENDENT_AMBULATORY_CARE_PROVIDER_SITE_OTHER): Payer: Medicare Other | Admitting: Internal Medicine

## 2018-11-14 ENCOUNTER — Encounter (INDEPENDENT_AMBULATORY_CARE_PROVIDER_SITE_OTHER): Payer: Self-pay | Admitting: Internal Medicine

## 2018-11-14 ENCOUNTER — Telehealth (INDEPENDENT_AMBULATORY_CARE_PROVIDER_SITE_OTHER): Payer: Self-pay | Admitting: Internal Medicine

## 2018-11-14 ENCOUNTER — Other Ambulatory Visit: Payer: Self-pay

## 2018-11-14 DIAGNOSIS — R197 Diarrhea, unspecified: Secondary | ICD-10-CM

## 2018-11-14 DIAGNOSIS — A0472 Enterocolitis due to Clostridium difficile, not specified as recurrent: Secondary | ICD-10-CM

## 2018-11-14 MED ORDER — METRONIDAZOLE 500 MG PO TABS: 500.0000 mg | ORAL_TABLET | Freq: Two times a day (BID) | ORAL | 1 refills | Status: AC

## 2018-11-14 NOTE — Patient Instructions (Signed)
GI pathogen. Rx for Flagyl 500mg  BID x 10 days sent to his pharmacy.

## 2018-11-14 NOTE — Telephone Encounter (Signed)
err

## 2018-11-14 NOTE — Progress Notes (Addendum)
Subjective:    Patient ID: Bryce Berry, male    DOB: 05/13/1941, 78 y.o.   MRN: [id number] Wife agrees to speak to me.  Time 230pm. Patient is at home. End of visit 245pm. Total of 15 minutes OV Agrees to this visit.  Unable to do Video OV. Telephone OV due to COVID-19.  Has not been seent since 2018. Hx of C-diff. HPI Wife states patient has been having diarrhea for about a week. This has been going off and on for a couple of weeks. Sometimes he will get  constipated.She has given him Imodium and he will become constipated. Has not been on any recent antibiotics.  He is okay. He is eating okay. W/C bound. Wife denies any melena or BRRB. No fever.  Hx of atrial flutter and covered with Xarelto.   12/25/2014 [place] Capsule Study:    Indication for procedure:   Patient is 78 year old  African-American male who presents with acute GI bleed. He has received 2 units of PRBCs. He underwent colonoscopy on 12/22/2014 revealing pancolonic diverticulosis and blood was also noted terminal ileum. He  Is therefore undergoing small bowel given capsule study.   Multiple small ulcers noted in distal small bowel or ileum starting at 5 hours 23 minutes and  58 seconds. Fresh blood noted initially on image at 6 are 52 minutes and 35 seconds with pooling at 6 hours 53 minutes and 12 seconds. Dark blood noted distal to area with fresh blood until lesion of study Cecum not reached in study duration which is 9 hours and 22 minutes.  His last colonoscopy was in 2016 (Acute GI bleed). Impression:  Fresh blood noted in terminal ileum. Blood and clots noted throughout the colon but no bleeding lesion identified. Scattered diverticula throughout the colon.  Review of Systems Past Medical History:  Diagnosis Date  . Atrial fibrillation ([place])   . Atrial flutter ([place])    Possible history in the past - details unclear  . Bilateral pulmonary embolism ([place])    a. 10/2012 - V:Q scan high prob for Bilat  PE in setting of dyspnea/hemoptysis-->anticoagulation started.  Marland Kitchen BPH (benign prostatic hyperplasia)   . CAD (coronary artery disease)    Stent PCI in [place] and also [place] in the past - details not clear  . Chronic anticoagulation   . Chronic pain   . 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 COPD (chronic obstructive pulmonary disease) ([place])   . Depression   . Dyslipidemia   . Essential hypertension   . GERD (gastroesophageal reflux disease)   . GI bleed   . Gout   . Hyperlipidemia    Mixed  . Irritable bowel   . Osteoarthritis   . Osteoporosis   . Peripheral arterial disease ([place])    Dopplers Dr.Vyas office October, 2010, apparent occlusion anterior tibial arteries bilaterally., findings suggest hemodynamically significant stenosis of the proximal right provisional femoral artery and the mid left superficial artery.  Ankle-brachial indices indicate moderate stenosis within both lower extremities  . Posttraumatic stress disorder   . Recurrent UTI (urinary tract infection)   . Right leg DVT ([place])    a. 10/2012 - subocclusive DVT R popliteal vein.  . Wheelchair bound     Past Surgical History:  Procedure Laterality Date  . [place]  . CATARACT EXTRACTION    . COLON RESECTION    . COLONOSCOPY N/A 03/13/2013   Procedure: COLONOSCOPY;  Surgeon: Bernadene Person  Gloriann Loan, MD;  [facility];  Service: Endoscopy;  Laterality: N/A;  730  . COLONOSCOPY N/A 12/22/2014   Procedure: COLONOSCOPY;  Surgeon: Rogene Houston, MD;  [facility];  Service: Endoscopy;  Laterality: N/A;  . CORONARY ANGIOPLASTY WITH STENT PLACEMENT     x 4  . GIVENS CAPSULE STUDY N/A 12/24/2014   Procedure: GIVENS CAPSULE STUDY;  Surgeon: Rogene Houston, MD;  [facility];  Service: Endoscopy;  Laterality: N/A;  . KNEE ARTHROPLASTY     Right  . PROSTATE SURGERY    . TOTAL HIP ARTHROPLASTY     Right-complicated hx  . TOTAL KNEE ARTHROPLASTY     Left     Allergies  Allergen Reactions  . Latex   . Omeprazole Other (See Comments)    Reaction unknown-possible upset stomach  . Other     Paper tape  . Penicillins Other (See Comments)    Has patient had a PCN reaction causing immediate rash, facial/tongue/throat swelling, SOB or lightheadedness with hypotension: No Has patient had a PCN reaction causing severe rash involving mucus membranes or skin necrosis: No Has patient had a PCN reaction that required hospitalization No Has patient had a PCN reaction occurring within the last 10 years: No If all of the above answers are "NO", then may proceed with Cephalosporin use.    Rapid heart rate; flushed  . Sulfonamide Derivatives Other (See Comments)    Rapid heart rate; flushed  . Iodine Rash    Current Outpatient Medications on File Prior to Visit  Medication Sig Dispense Refill  . albuterol (PROVENTIL) (2.5 MG/3ML) 0.083% nebulizer solution Take 2.5 mg by nebulization every 6 (six) hours as needed for wheezing or shortness of breath.     . allopurinol (ZYLOPRIM) 100 MG tablet Take 100 mg by mouth every evening.     Jearl Klinefelter ELLIPTA 62.5-25 MCG/INH AEPB Inhale 1 puff into the lungs daily.    Marland Kitchen ascorbic acid (VITAMIN C) 500 MG tablet Take 500 mg by mouth daily.     Marland Kitchen atorvastatin (LIPITOR) 80 MG tablet Take 80 mg by mouth every evening.     . benzonatate (TESSALON) 100 MG capsule Take 100 mg by mouth 3 (three) times daily as needed for cough.     . calcium-vitamin D (OSCAL WITH D) 500-200 MG-UNIT per tablet Take 1 tablet by mouth daily.     . carbidopa-levodopa (SINEMET IR) 25-100 MG tablet Take 1 tablet by mouth daily.    Marland Kitchen COLCRYS 0.6 MG tablet Take 1 tablet by mouth daily as needed (gout pain).     Marland Kitchen diclofenac sodium (VOLTAREN) 1 % GEL Apply 1 application topically daily. Applied to feet    . digoxin (LANOXIN) 0.125 MG tablet Take 1 tablet (0.125 mg total) by mouth daily. 5 tablet 0  . diltiazem (CARDIZEM CD) 120 MG 24 hr capsule  Take 1 capsule (120 mg total) by mouth daily. As directed based off blood pressure readings 30 capsule 1  . fenofibrate (TRICOR) 145 MG tablet Take 1 tablet (145 mg total) by mouth daily. (Patient taking differently: Take 145 mg by mouth every evening. ) 30 tablet 6  . fentaNYL (DURAGESIC - DOSED MCG/HR) 25 MCG/HR patch Place 25 mcg onto the skin every 3 (three) days.    . ferrous sulfate 325 (65 FE) MG tablet Take 325 mg by mouth daily with breakfast.    . finasteride (PROSCAR) 5 MG tablet Take 5 mg by mouth  daily.      . fluticasone (FLONASE) 50 MCG/ACT nasal spray Place 2 sprays into both nostrils daily.    . furosemide (LASIX) 40 MG tablet Take 40 mg by mouth every evening. *May take 80mg  as needed for swelling    . HYDROcodone-acetaminophen (NORCO) 10-325 MG tablet Take 1 tablet by mouth every 6 (six) hours as needed for moderate pain or severe pain.     Marland Kitchen ipratropium-albuterol (DUONEB) 0.5-2.5 (3) MG/3ML SOLN Take 3 mLs by nebulization 3 (three) times daily. 360 mL 6  . lidocaine (LIDODERM) 5 % Place 1 patch onto the skin daily as needed (for pain). Every 12 hours as needed    . lisinopril (PRINIVIL,ZESTRIL) 20 MG tablet Take 20 mg by mouth daily. Take one tablet by mouth if blood pressure is over 150    . magnesium oxide (MAG-OX) 400 (241.3 MG) MG tablet Take 1 tablet (400 mg total) by mouth 2 (two) times daily. 60 tablet 2  . metoprolol tartrate (LOPRESSOR) 50 MG tablet Take 50 mg by mouth 2 (two) times daily.    . Multiple Vitamin (MULTIVITAMIN) tablet Take 1 tablet by mouth daily.      . nitroGLYCERIN (NITROSTAT) 0.4 MG SL tablet Place 1 tablet (0.4 mg total) under the tongue every 5 (five) minutes x 3 doses as needed. 25 tablet 3  . Omega-3 Fatty Acids (FISH OIL) 1000 MG CAPS Take 2 capsules by mouth daily.     . pantoprazole (PROTONIX) 40 MG tablet Take 40 mg by mouth 2 (two) times daily.     . potassium chloride SA (K-DUR,KLOR-CON) 20 MEQ tablet Take 2 tablets (40 mEq total) by mouth  daily. (Patient taking differently: Take 20 mEq by mouth daily. *Give additional tablet if Lasix is increased due to swelling) 30 tablet 0  . PROAIR HFA 108 (90 Base) MCG/ACT inhaler Inhale 2 puffs into the lungs 2 (two) times daily as needed for wheezing or shortness of breath. 2 puffs twice daily    . rivaroxaban (XARELTO) 20 MG TABS tablet Take 1 tablet (20 mg total) by mouth daily. 30 tablet 10  . rivastigmine (EXELON) 9.5 mg/24hr APPLY 1 PATCH DAILY. 90 patch 6  . tamsulosin (FLOMAX) 0.4 MG CAPS capsule Take 0.4 mg by mouth daily after supper.    . [DISCONTINUED] solifenacin (VESICARE) 10 MG tablet Take 10 mg by mouth daily.      No current facility-administered medications on file prior to visit.         Objective:   Physical Exam  deferred     Assessment & Plan:  Diarrhea. Possible C diff. Will get a GI pathogen. Will cover with Flagyl for now till I get results. If specimen comes back positive will switch to Vancomycin 125mg  QID x 10 days. Further recommendations to follow.

## 2018-11-17 LAB — GASTROINTESTINAL PATHOGEN PANEL PCR
C. DIFFICILE TOX A/B, PCR: DETECTED — AB
CAMPYLOBACTER, PCR: NOT DETECTED
Cryptosporidium, PCR: NOT DETECTED
E COLI (ETEC) LT/ST, PCR: NOT DETECTED
E COLI 0157, PCR: NOT DETECTED
E coli (STEC) stx1/stx2, PCR: NOT DETECTED
GIARDIA LAMBLIA, PCR: NOT DETECTED
Norovirus, PCR: NOT DETECTED
Rotavirus A, PCR: NOT DETECTED
SHIGELLA, PCR: NOT DETECTED
Salmonella, PCR: NOT DETECTED

## 2018-11-20 ENCOUNTER — Telehealth (INDEPENDENT_AMBULATORY_CARE_PROVIDER_SITE_OTHER): Payer: Self-pay | Admitting: Internal Medicine

## 2018-11-20 DIAGNOSIS — A0472 Enterocolitis due to Clostridium difficile, not specified as recurrent: Secondary | ICD-10-CM

## 2018-11-20 MED ORDER — VANCOMYCIN HCL 125 MG PO CAPS: 125.0000 mg | ORAL_CAPSULE | Freq: Four times a day (QID) | ORAL | 0 refills | Status: DC

## 2018-11-20 MED ORDER — VANCOMYCIN HCL 125 MG PO CAPS: 125.0000 mg | ORAL_CAPSULE | Freq: Three times a day (TID) | ORAL | 0 refills | Status: DC

## 2018-11-20 NOTE — Telephone Encounter (Signed)
err

## 2018-11-20 NOTE — Telephone Encounter (Signed)
Rx for Vancomycin 125mg  sent to his pharmacy

## 2018-11-20 NOTE — Telephone Encounter (Signed)
Vancomycin 125mg  QID x 10 days sent to [facility].

## 2018-11-20 NOTE — Telephone Encounter (Signed)
Rx sent to her pharmacy 

## 2018-11-20 NOTE — Telephone Encounter (Signed)
Error. Stop the Flagyl and start the Vancomycin

## 2018-12-11 ENCOUNTER — Other Ambulatory Visit: Payer: Self-pay

## 2018-12-11 ENCOUNTER — Ambulatory Visit (INDEPENDENT_AMBULATORY_CARE_PROVIDER_SITE_OTHER): Payer: Medicare Other | Admitting: Internal Medicine

## 2018-12-11 ENCOUNTER — Encounter (INDEPENDENT_AMBULATORY_CARE_PROVIDER_SITE_OTHER): Payer: Self-pay | Admitting: Internal Medicine

## 2018-12-11 DIAGNOSIS — K580 Irritable bowel syndrome with diarrhea: Secondary | ICD-10-CM | POA: Diagnosis not present

## 2018-12-11 MED ORDER — DICYCLOMINE HCL 10 MG PO CAPS: 10.0000 mg | ORAL_CAPSULE | Freq: Every day | ORAL | 3 refills | Status: AC

## 2018-12-11 NOTE — Progress Notes (Signed)
Subjective:    Patient ID: Bryce Berry, male    DOB: 16-Oct-1940, 78 y.o.   MRN: [id number]    Start time 11:15pm. End time 1120am. Total time 10 minutes. PCP Dr. Woody Seller. Patient is at home. I am in the office. Wife is giving the information. She consents to this telephone OV. Telephone OV due to the COVID-19. Patient does not have video capabilities. Telephone OV 11/14/2018. He was having diarrhea. GI pathogen was positive for C-diff. Covered with Vancomycin 125mg  Today his wife states he has IBS diarrhea at times. Patient is w/c bound and usually stays in bed. Wife states he is doing pretty good. He is taking plenty of fluids. He usually has 3-4 large stools a day.  Stools are better now. He alternates between constipation and diarrhea.  He  Has a care giver during the day.  His appetite is good. No weight loss.                 Review of Systems Past Medical History:  Diagnosis Date  . Atrial fibrillation ([place])   . Atrial flutter ([place])    Possible history in the past - details unclear  . Bilateral pulmonary embolism ([place])    a. 10/2012 - V:Q scan high prob for Bilat PE in setting of dyspnea/hemoptysis-->anticoagulation started.  Marland Kitchen BPH (benign prostatic hyperplasia)   . CAD (coronary artery disease)    Stent PCI in [place] and also [place] in the past - details not clear  . Chronic anticoagulation   . Chronic pain   . 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 COPD (chronic obstructive pulmonary disease) ([place])   . Depression   . Dyslipidemia   . Essential hypertension   . GERD (gastroesophageal reflux disease)   . GI bleed   . Gout   . Hyperlipidemia    Mixed  . Irritable bowel   . Osteoarthritis   . Osteoporosis   . Peripheral arterial disease ([place])    Dopplers Dr.Vyas office October, 2010, apparent occlusion anterior tibial arteries bilaterally., findings suggest hemodynamically significant stenosis of the proximal right provisional femoral  artery and the mid left superficial artery.  Ankle-brachial indices indicate moderate stenosis within both lower extremities  . Posttraumatic stress disorder   . Recurrent UTI (urinary tract infection)   . Right leg DVT ([place])    a. 10/2012 - subocclusive DVT R popliteal vein.  . Wheelchair bound     Past Surgical History:  Procedure Laterality Date  . [place]  . CATARACT EXTRACTION    . COLON RESECTION    . COLONOSCOPY N/A 03/13/2013   Procedure: COLONOSCOPY;  Surgeon: Rogene Houston, MD;  Location: [facility];  Service: Endoscopy;  Laterality: N/A;  730  . COLONOSCOPY N/A 12/22/2014   Procedure: COLONOSCOPY;  Surgeon: Rogene Houston, MD;  Location: [facility];  Service: Endoscopy;  Laterality: N/A;  . CORONARY ANGIOPLASTY WITH STENT PLACEMENT     x 4  . GIVENS CAPSULE STUDY N/A 12/24/2014   Procedure: GIVENS CAPSULE STUDY;  Surgeon: Rogene Houston, MD;  Location: [facility];  Service: Endoscopy;  Laterality: N/A;  . KNEE ARTHROPLASTY     Right  . PROSTATE SURGERY    . TOTAL HIP ARTHROPLASTY     Right-complicated hx  . TOTAL KNEE ARTHROPLASTY     Left    Allergies  Allergen Reactions  . Latex   . Omeprazole Other (  See Comments)    Reaction unknown-possible upset stomach  . Other     Paper tape  . Penicillins Other (See Comments)    Has patient had a PCN reaction causing immediate rash, facial/tongue/throat swelling, SOB or lightheadedness with hypotension: No Has patient had a PCN reaction causing severe rash involving mucus membranes or skin necrosis: No Has patient had a PCN reaction that required hospitalization No Has patient had a PCN reaction occurring within the last 10 years: No If all of the above answers are "NO", then may proceed with Cephalosporin use.    Rapid heart rate; flushed  . Sulfonamide Derivatives Other (See Comments)    Rapid heart rate; flushed  . Iodine Rash    Current Outpatient Medications on File Prior to  Visit  Medication Sig Dispense Refill  . albuterol (PROVENTIL) (2.5 MG/3ML) 0.083% nebulizer solution Take 2.5 mg by nebulization every 6 (six) hours as needed for wheezing or shortness of breath.     . allopurinol (ZYLOPRIM) 100 MG tablet Take 100 mg by mouth every evening.     Jearl Klinefelter ELLIPTA 62.5-25 MCG/INH AEPB Inhale 1 puff into the lungs daily.    Marland Kitchen ascorbic acid (VITAMIN C) 500 MG tablet Take 500 mg by mouth daily.     Marland Kitchen atorvastatin (LIPITOR) 80 MG tablet Take 80 mg by mouth every evening.     . benzonatate (TESSALON) 100 MG capsule Take 100 mg by mouth 3 (three) times daily as needed for cough.     . calcium-vitamin D (OSCAL WITH D) 500-200 MG-UNIT per tablet Take 1 tablet by mouth daily.     . carbidopa-levodopa (SINEMET IR) 25-100 MG tablet Take 1 tablet by mouth 2 (two) times a day.    Marland Kitchen COLCRYS 0.6 MG tablet Take 1 tablet by mouth daily as needed (gout pain).     Marland Kitchen diclofenac sodium (VOLTAREN) 1 % GEL Apply 1 application topically daily. Applied to feet    . digoxin (LANOXIN) 0.125 MG tablet Take 1 tablet (0.125 mg total) by mouth daily. 5 tablet 0  . diltiazem (CARDIZEM CD) 120 MG 24 hr capsule Take 1 capsule (120 mg total) by mouth daily. As directed based off blood pressure readings 30 capsule 1  . fenofibrate (TRICOR) 145 MG tablet Take 1 tablet (145 mg total) by mouth daily. (Patient taking differently: Take 145 mg by mouth every evening. ) 30 tablet 6  . fentaNYL (DURAGESIC - DOSED MCG/HR) 25 MCG/HR patch Place 25 mcg onto the skin every 3 (three) days.    . ferrous sulfate 325 (65 FE) MG tablet Take 325 mg by mouth daily with breakfast.    . finasteride (PROSCAR) 5 MG tablet Take 5 mg by mouth daily.      . fluticasone (FLONASE) 50 MCG/ACT nasal spray Place 2 sprays into both nostrils daily.    . furosemide (LASIX) 40 MG tablet Take 40 mg by mouth every evening. *May take 80mg  as needed for swelling    . HYDROcodone-acetaminophen (NORCO) 10-325 MG tablet Take 1 tablet by mouth  every 6 (six) hours as needed for moderate pain or severe pain.     Marland Kitchen ipratropium-albuterol (DUONEB) 0.5-2.5 (3) MG/3ML SOLN Take 3 mLs by nebulization 3 (three) times daily. 360 mL 6  . lidocaine (LIDODERM) 5 % Place 1 patch onto the skin daily as needed (for pain). Every 12 hours as needed    . lisinopril (PRINIVIL,ZESTRIL) 20 MG tablet Take 20 mg by mouth daily. Take one tablet  by mouth if blood pressure is over 150    . magnesium oxide (MAG-OX) 400 (241.3 MG) MG tablet Take 1 tablet (400 mg total) by mouth 2 (two) times daily. 60 tablet 2  . metoprolol tartrate (LOPRESSOR) 50 MG tablet Take 50 mg by mouth 2 (two) times daily.    . Multiple Vitamin (MULTIVITAMIN) tablet Take 1 tablet by mouth daily.      . nitroGLYCERIN (NITROSTAT) 0.4 MG SL tablet Place 1 tablet (0.4 mg total) under the tongue every 5 (five) minutes x 3 doses as needed. 25 tablet 3  . Omega-3 Fatty Acids (FISH OIL) 1000 MG CAPS Take 2 capsules by mouth daily.     . pantoprazole (PROTONIX) 40 MG tablet Take 40 mg by mouth 2 (two) times daily.     . potassium chloride SA (K-DUR,KLOR-CON) 20 MEQ tablet Take 2 tablets (40 mEq total) by mouth daily. (Patient taking differently: Take 20 mEq by mouth daily. *Give additional tablet if Lasix is increased due to swelling) 30 tablet 0  . PROAIR HFA 108 (90 Base) MCG/ACT inhaler Inhale 2 puffs into the lungs 2 (two) times daily as needed for wheezing or shortness of breath. 2 puffs twice daily    . rivaroxaban (XARELTO) 20 MG TABS tablet Take 1 tablet (20 mg total) by mouth daily. 30 tablet 10  . rivastigmine (EXELON) 9.5 mg/24hr APPLY 1 PATCH DAILY. 90 patch 6  . tamsulosin (FLOMAX) 0.4 MG CAPS capsule Take 0.4 mg by mouth daily after supper.    . [DISCONTINUED] solifenacin (VESICARE) 10 MG tablet Take 10 mg by mouth daily.      No current facility-administered medications on file prior to visit.           Objective:   Physical Exam deferred       Assessment & Plan:  IBS  diarrhea on occasion. Wife states he alternates between constipation and diarrhea. . Will try Dicyclomine 10mg  daily.  Will try since he is w/c bound and is not ambulatory.

## 2018-12-11 NOTE — Patient Instructions (Signed)
Rx for Dicyclomine 10mg  daily sent to his pharmacy.

## 2019-02-20 ENCOUNTER — Other Ambulatory Visit: Payer: Self-pay

## 2019-02-20 ENCOUNTER — Emergency Department (HOSPITAL_COMMUNITY): Payer: Medicare Other

## 2019-02-20 ENCOUNTER — Emergency Department (HOSPITAL_COMMUNITY)
Admission: EM | Admit: 2019-02-20 | Discharge: 2019-02-20 | Disposition: A | Discharge status: Home / Self Care | Payer: Medicare Other | Attending: Emergency Medicine | Admitting: Emergency Medicine

## 2019-02-20 ENCOUNTER — Encounter (HOSPITAL_COMMUNITY): Payer: Self-pay

## 2019-02-20 DIAGNOSIS — S62234A Other nondisplaced fracture of base of first metacarpal bone, right hand, initial encounter for closed fracture: Secondary | ICD-10-CM | POA: Insufficient documentation

## 2019-02-20 DIAGNOSIS — Z87891 Personal history of nicotine dependence: Secondary | ICD-10-CM | POA: Insufficient documentation

## 2019-02-20 DIAGNOSIS — I2510 Atherosclerotic heart disease of native coronary artery without angina pectoris: Secondary | ICD-10-CM | POA: Insufficient documentation

## 2019-02-20 DIAGNOSIS — W06XXXA Fall from bed, initial encounter: Secondary | ICD-10-CM | POA: Insufficient documentation

## 2019-02-20 DIAGNOSIS — S0083XA Contusion of other part of head, initial encounter: Secondary | ICD-10-CM | POA: Insufficient documentation

## 2019-02-20 DIAGNOSIS — S90414A Abrasion, right lesser toe(s), initial encounter: Secondary | ICD-10-CM | POA: Diagnosis not present

## 2019-02-20 DIAGNOSIS — S60221A Contusion of right hand, initial encounter: Secondary | ICD-10-CM

## 2019-02-20 DIAGNOSIS — F0390 Unspecified dementia without behavioral disturbance: Secondary | ICD-10-CM | POA: Insufficient documentation

## 2019-02-20 DIAGNOSIS — Y92230 Patient room in hospital as the place of occurrence of the external cause: Secondary | ICD-10-CM | POA: Insufficient documentation

## 2019-02-20 DIAGNOSIS — W19XXXA Unspecified fall, initial encounter: Secondary | ICD-10-CM

## 2019-02-20 DIAGNOSIS — Y939 Activity, unspecified: Secondary | ICD-10-CM | POA: Diagnosis not present

## 2019-02-20 DIAGNOSIS — Y999 Unspecified external cause status: Secondary | ICD-10-CM | POA: Diagnosis not present

## 2019-02-20 DIAGNOSIS — I129 Hypertensive chronic kidney disease with stage 1 through stage 4 chronic kidney disease, or unspecified chronic kidney disease: Secondary | ICD-10-CM | POA: Diagnosis not present

## 2019-02-20 DIAGNOSIS — N189 Chronic kidney disease, unspecified: Secondary | ICD-10-CM | POA: Insufficient documentation

## 2019-02-20 DIAGNOSIS — S90412A Abrasion, left great toe, initial encounter: Secondary | ICD-10-CM | POA: Insufficient documentation

## 2019-02-20 DIAGNOSIS — S62201A Unspecified fracture of first metacarpal bone, right hand, initial encounter for closed fracture: Secondary | ICD-10-CM

## 2019-02-20 MED ORDER — HYDROCODONE-ACETAMINOPHEN 10-325 MG PO TABS
1.0000 | ORAL_TABLET | Freq: Once | ORAL | Status: AC
  Administered 2019-02-20: 20:00:00 1 via ORAL
  Filled 2019-02-20: qty 1

## 2019-02-20 NOTE — ED Provider Notes (Signed)
[facility] DEPT Provider Note   CSN: [id number] Arrival date & time: 02/20/19  1346    History   Chief Complaint Chief Complaint  Patient presents with   Fall    HPI Bryce Berry is a 78 y.o. male.     The history is provided by the patient, medical records and the spouse. No language interpreter was used.  Fall   Bryce Berry is a 78 y.o. male who presents to the Emergency Department complaining of fall. Level V caveat due to dementia, hx is provided by wife.  Six days ago he was seen at [facility] for high blood pressure.  During his stay he fell out of the bed.  Since then he has head a hematoma to his left forehead and his right thumb has decreased movement.  And he has decreased movement in his toes.  He presents today due to ongoing headache.  Denies fevers, N/V, sob, cough.   Past Medical History:  Diagnosis Date   Atrial fibrillation [facility])    Atrial flutter ([place])    Possible history in the past - details unclear   Bilateral pulmonary embolism ([place])    a. 10/2012 - V:Q scan high prob for Bilat PE in setting of dyspnea/hemoptysis-->anticoagulation started.   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   Ankle-brachial indices indicate moderate stenosis within both lower extremities   Posttraumatic stress disorder    Recurrent UTI (urinary tract infection)    Right leg DVT ([place])    a. 10/2012 - subocclusive DVT R popliteal vein.   Wheelchair bound     Patient Active Problem List   Diagnosis Date Noted   Syncope 02/10/2017   Mild dementia ([place]) 09/01/2015   Generalized weakness 07/30/2015   Acute encephalopathy 07/29/2015   UTI (lower urinary tract infection) 07/29/2015   PE (pulmonary embolism) 01/03/2015   Pulmonary embolism ([place]) 01/03/2015   Acute blood loss anemia 12/24/2014   Clostridium difficile colitis 12/22/2014   Diverticulitis of large intestine without perforation or abscess with bleeding    Diverticulitis 12/21/2014   Melena 12/21/2014   Diarrhea 12/16/2014   Hypotension 12/16/2014   HX: anticoagulation 11/02/2013   Right leg DVT ([place])    Bilateral pulmonary embolism (HCC)    Ejection fraction    Anemia 03/01/2012   History of lower GI bleeding 11/01/2011   Gastroparesis 07/13/2011   Constipation 07/13/2011   Atrial fibrillation (H[facility])    Renal insufficiency    COPD (chronic obstructive pulmonary disease) (H[facility])    Posttraumatic stress disorder    GERD (gastroesophageal reflux disease)    Hypertension    Dyslipidemia    CAD (coronary artery disease)  Atrial flutter (HCC)    Gout    Peripheral arterial disease (HCC)    Chronic kidney disease    Atrial flutter ([place]) 06/20/2009   Peripheral vascular disease ([place]) 06/20/2009   Hyperlipidemia 05/09/2009   Essential hypertension 22/29/7989   OTH COMPLICATIONS DUE INTERNAL JOINT PROSTHESIS 04/01/2009   DEGENERATIVE JOINT DISEASE, HIPS 09/25/2007   SHOULDER PAIN 09/25/2007   HIP PAIN 09/25/2007   BACK PAIN 09/25/2007   IMPINGEMENT SYNDROME 09/25/2007    Past Surgical History:  Procedure Laterality Date   BACK SURGERY     [place]   CATARACT  EXTRACTION     COLON RESECTION     COLONOSCOPY N/A 03/13/2013   Procedure: COLONOSCOPY;  Surgeon: Rogene Houston, MD;  Location: [facility] END[facility] SU[facility];  Service: Endoscopy;  Laterality: N/A;  730   COLONOSCOPY N/A 12/22/2014   Procedure: COLONOSCOPY;  Surgeon: Rogene Houston, MD;  Location: [facility] ENDO SUITE;  Service: Endoscopy;  Laterality: N/A;   CORONARY ANGIOPLASTY WITH STENT PLACEMENT     x 4   GIVENS CAPSULE STUDY N/A 12/24/2014   Procedure: GIVENS CAPSULE STUDY;  Surgeon: Rogene Houston, MD;  Location: [facility] END[facility] SUITE;  Service: Endoscopy;  Laterality: N/A;   KNEE ARTHROPLASTY     Right   PROSTATE SURGERY     TOTAL HIP ARTHROPLASTY     Right-complicated hx   TOTAL KNEE ARTHROPLASTY     Left        Home Medications    Prior to Admission medications   Medication Sig Start Date End Date Taking? Authorizing Provider  albuterol (PROVENTIL) (2.5 MG/3ML) 0.083% nebulizer solution Take 2.5 mg by nebulization every 6 (six) hours as needed for wheezing or shortness of breath.  01/11/17   [provider]  allopurinol (ZYLOPRIM) 100 MG tablet Take 100 mg by mouth every evening.     [provider]  ANORO ELLIPTA 62.5-25 MCG/INH AEPB Inhale 1 puff into the lungs daily. 04/19/16   [provider]  ascorbic acid (VITAMIN C) 500 MG tablet Take 500 mg by mouth daily.     [provider]  atorvastatin (LIPITOR) 80 MG tablet Take 80 mg by mouth every evening.     [provider]  benzonatate (TESSALON) 100 MG capsule Take 100 mg by mouth 3 (three) times daily as needed for cough.  01/06/17   [provider]  calcium-vitamin D (OSCAL WITH D) 500-200 MG-UNIT per tablet Take 1 tablet by mouth daily.     [provider]  carbidopa-levodopa (SINEMET IR) 25-100 MG tablet Take 1 tablet by mouth 2 (two) times a day.    [provider]  COLCRYS 0.6 MG tablet Take 1 tablet by mouth daily as needed (gout pain).  07/07/15   [provider]  diclofenac sodium (VOLTAREN) 1 % GEL Apply 1 application topically daily. Applied to feet 05/20/15   [provider]  dicyclomine (BENTYL) 10 MG capsule Take 1 capsule (10 mg total) by mouth daily. 12/11/18   Setzer, Rona Ravens, NP  digoxin (LANOXIN) 0.125 MG tablet Take 1 tablet (0.125 mg total) by mouth daily. 10/04/18   Satira Sark, MD  diltiazem (CARDIZEM CD) 120 MG 24 hr capsule Take 1 capsule (120 mg total) by mouth daily. As directed based off blood pressure readings 09/28/17   Satira Sark, MD  fenofibrate (TRICOR) 145 MG tablet Take 1 tablet (145 mg total) by mouth daily. Patient taking differently: Take 145 mg by mouth every  evening.  08/16/11   de Stanford Scotland, MD  fentaNYL (DURAGESIC - DOSED MCG/HR) 25 MCG/HR patch Place 25 mcg onto the skin every 3 (three) days.    [provider]  ferrous sulfate 325 (65 FE) MG tablet Take 325 mg by mouth daily with breakfast.    [provider]  finasteride (PROSCAR) 5 MG tablet Take 5 mg by mouth daily.      [provider]  fluticasone (FLONASE) 50 MCG/ACT nasal spray Place 2 sprays into both nostrils daily.    [provider]  furosemide (LASIX) 40 MG tablet Take 40 mg by mouth every evening. *May take 80mg  as needed for swelling    [provider]  HYDROcodone-acetaminophen (NORCO) 10-325 MG tablet Take 1 tablet by mouth every 6 (six) hours as needed for moderate pain or severe pain.  01/21/17   [provider]  ipratropium-albuterol (DUONEB) 0.5-2.5 (3) MG/3ML SOLN Take 3 mLs by nebulization 3 (three) times daily. 01/20/17   Mannam, Hart Robinsons, MD  lidocaine (LIDODERM) 5 % Place 1 patch onto the skin daily as needed (for pain). Every 12 hours as needed 08/29/15   [provider]  lisinopril (PRINIVIL,ZESTRIL) 20 MG tablet Take 20 mg by mouth daily. Take one tablet by mouth if blood pressure is over 150    [provider]  magnesium oxide (MAG-OX) 400  (241.3 MG) MG tablet Take 1 tablet (400 mg total) by mouth 2 (two) times daily. 01/06/15   Reyne Dumas, MD  metoprolol tartrate (LOPRESSOR) 50 MG tablet Take 50 mg by mouth 2 (two) times daily.    [provider]  Multiple Vitamin (MULTIVITAMIN) tablet Take 1 tablet by mouth daily.      [provider]  nitroGLYCERIN (NITROSTAT) 0.4 MG SL tablet Place 1 tablet (0.4 mg total) under the tongue every 5 (five) minutes x 3 doses as needed. 01/07/15   Carlena Bjornstad, MD  Omega-3 Fatty Acids (FISH OIL) 1000 MG CAPS Take 2 capsules by mouth daily.     [provider]  pantoprazole (PROTONIX) 40 MG tablet Take 40 mg by mouth 2 (two) times daily.     [provider]  potassium chloride SA (K-DUR,KLOR-CON) 20 MEQ tablet Take 2 tablets (40 mEq total) by mouth daily. Patient taking differently: Take 20 mEq by mouth daily. *Give additional tablet if Lasix is increased due to swelling 01/05/15   Reyne Dumas, MD  PROAIR HFA 108 (90 Base) MCG/ACT inhaler Inhale 2 puffs into the lungs 2 (two) times daily as needed for wheezing or shortness of breath. 2 puffs twice daily 08/20/15   [provider]  rivaroxaban (XARELTO) 20 MG TABS tablet Take 1 tablet (20 mg total) by mouth daily. 01/27/15   Reyne Dumas, MD  rivastigmine (EXELON) 9.5 mg/24hr APPLY 1 PATCH DAILY. 03/21/17   Cameron Sprang, MD  tamsulosin (FLOMAX) 0.4 MG CAPS capsule Take 0.4 mg by mouth daily after supper.    [provider]    Family History Family History  Problem Relation Age of Onset   Diabetes Daughter    Hypertension Daughter    Obesity Daughter    Cancer Maternal Grandmother     Social History Social History   Tobacco Use   Smoking status: Former Smoker    Packs/day: 1.00    Years: 30.00    Pack years: 30.00    Types: Cigarettes    Start date: 08/31/1956    Quit date: 08/17/2003  Years since quitting: 15.5   Smokeless tobacco: Never Used   Tobacco comment: Patient  smoked a pack a day for about 30 years  Substance Use Topics   Alcohol use: No    Alcohol/week: 0.0 standard drinks   Drug use: No    Types: Other-see comments     Allergies   Latex, Omeprazole, Other, Penicillins, Sulfonamide derivatives, and Iodine   Review of Systems Review of Systems  All other systems reviewed and are negative.    Physical Exam Updated Vital Signs BP (!) 141/86 (BP Location: Left Arm)    Pulse (!) 58    Temp 99.2 F (37.3 C) (Oral)    Resp 20    Ht 6\' 2"  (1.88 m)    Wt 135.2 kg    SpO2 100%    BMI 38.26 kg/m   Physical Exam Vitals signs and nursing note reviewed.  Constitutional:      Appearance: He is well-developed.     Comments: Chronically ill appearing, resting in wheelchair  HENT:     Head: Normocephalic.     Comments: Hematoma to left forehead Cardiovascular:     Rate and Rhythm: Normal rate and regular rhythm.     Heart sounds: No murmur.  Pulmonary:     Effort: Pulmonary effort is normal. No respiratory distress.     Breath sounds: Normal breath sounds.  Abdominal:     Palpations: Abdomen is soft.     Tenderness: There is no abdominal tenderness. There is no guarding or rebound.  Musculoskeletal:        General: Tenderness present.     Comments: Swelling and tenderness to right thumb, weakly wiggles thumb.  Abrasion to left great toe.  Abrasion to right second toe.    Skin:    General: Skin is warm and dry.  Neurological:     Mental Status: He is alert.     Comments: Significant weakness to all four extremities with muscular atrophy to all four extremities  Psychiatric:        Behavior: Behavior normal.      ED Treatments / Results  Labs (all labs ordered are listed, but only abnormal results are displayed) Labs Reviewed - No data to display  EKG None  Radiology Ct Head Wo Contrast  Result Date: 02/20/2019 CLINICAL DATA:  Fall out of bed. Head trauma, minor, pt on anticoagulation; C-spine trauma, high clinical risk  (NEXUS/CCR) EXAM: CT HEAD WITHOUT CONTRAST CT CERVICAL SPINE WITHOUT CONTRAST TECHNIQUE: Multidetector CT imaging of the head and cervical spine was performed following the standard protocol without intravenous contrast. Multiplanar CT image reconstructions of the cervical spine were also generated. COMPARISON:  Head and cervical spine CT 1 week ago 02/14/2019. Head CT 02/10/2017 FINDINGS: CT HEAD FINDINGS Brain: No intracranial hemorrhage, mass effect, or midline shift. No hydrocephalus. The basilar cisterns are patent. Unchanged degree of atrophy and chronic small vessel ischemia. No evidence of territorial infarct or acute ischemia. No extra-axial or intracranial fluid collection. Vascular: Atherosclerosis of skullbase vasculature without hyperdense vessel or abnormal calcification. Skull: No fracture or focal lesion. Sinuses/Orbits: New opacification a left sphenoid sinus with fluid level. Scattered opacification of bilateral mastoid air cells. Bilateral cataract resection. Other: Left frontal scalp hematoma, decreasing in size from prior exam. CT CERVICAL SPINE FINDINGS Alignment: No traumatic subluxation. Skull base and vertebrae: No acute fracture. Dens and skull base are intact. Soft tissues and spinal canal: No prevertebral soft tissue edema. No evidence of canal hematoma. Disc levels: Diffuse  degenerative disc disease and facet hypertrophy, unchanged from prior exam. Upper chest: No acute findings. Small bilateral cervical ribs. Other: Carotid calcifications. IMPRESSION: 1. No acute intracranial abnormality or skull fracture. Unchanged atrophy and chronic small vessel ischemia. 2. Left frontal scalp hematoma, decreased in size from prior exam. 3. No acute fracture or traumatic subluxation of the cervical spine. 4. Carotid and skullbase atherosclerosis. Electronically Signed   By: Keith Rake M.D.   On: 02/20/2019 21:56   Ct Cervical Spine Wo Contrast  Result Date: 02/20/2019 CLINICAL DATA:  Fall  out of bed. Head trauma, minor, pt on anticoagulation; C-spine trauma, high clinical risk (NEXUS/CCR) EXAM: CT HEAD WITHOUT CONTRAST CT CERVICAL SPINE WITHOUT CONTRAST TECHNIQUE: Multidetector CT imaging of the head and cervical spine was performed following the standard protocol without intravenous contrast. Multiplanar CT image reconstructions of the cervical spine were also generated. COMPARISON:  Head and cervical spine CT 1 week ago 02/14/2019. Head CT 02/10/2017 FINDINGS: CT HEAD FINDINGS Brain: No intracranial hemorrhage, mass effect, or midline shift. No hydrocephalus. The basilar cisterns are patent. Unchanged degree of atrophy and chronic small vessel ischemia. No evidence of territorial infarct or acute ischemia. No extra-axial or intracranial fluid collection. Vascular: Atherosclerosis of skullbase vasculature without hyperdense vessel or abnormal calcification. Skull: No fracture or focal lesion. Sinuses/Orbits: New opacification a left sphenoid sinus with fluid level. Scattered opacification of bilateral mastoid air cells. Bilateral cataract resection. Other: Left frontal scalp hematoma, decreasing in size from prior exam. CT CERVICAL SPINE FINDINGS Alignment: No traumatic subluxation. Skull base and vertebrae: No acute fracture. Dens and skull base are intact. Soft tissues and spinal canal: No prevertebral soft tissue edema. No evidence of canal hematoma. Disc levels: Diffuse degenerative disc disease and facet hypertrophy, unchanged from prior exam. Upper chest: No acute findings. Small bilateral cervical ribs. Other: Carotid calcifications. IMPRESSION: 1. No acute intracranial abnormality or skull fracture. Unchanged atrophy and chronic small vessel ischemia. 2. Left frontal scalp hematoma, decreased in size from prior exam. 3. No acute fracture or traumatic subluxation of the cervical spine. 4. Carotid and skullbase atherosclerosis. Electronically Signed   By: Keith Rake M.D.   On: 02/20/2019  21:56   Dg Finger Thumb Right  Result Date: 02/20/2019 CLINICAL DATA:  Pain. EXAM: RIGHT THUMB 2+V COMPARISON:  February 14, 2019 FINDINGS: Again identified is a subacute healing fracture of the base of the first metacarpal. There are advanced degenerative changes of the interphalangeal joints of the first digit. There is diffuse osteopenia which limits detection of nondisplaced fractures. There is a small cortical fragment adjacent to the first metacarpophalangeal joint which may represent sequela of an old remote injury. There is soft tissue swelling about the first digit. There is disuse osteopenia of the hand. IMPRESSION: Healing fracture of the first metacarpal. No significant interval change from prior study. Electronically Signed   By: Constance Holster M.D.   On: 02/20/2019 22:03   Dg Foot 2 Views Left  Result Date: 02/20/2019 CLINICAL DATA:  Pain status post fall EXAM: LEFT FOOT - 2 VIEW COMPARISON:  Slight first 2020 FINDINGS: There are advanced degenerative changes of the first metatarsophalangeal joint. There is osteopenia which limits detection of nondisplaced fractures. Vascular calcifications are noted. There are advanced degenerative changes of the midfoot. Evaluation of the digits is limited by a suboptimal lateral view with multiple overlapping osseous structures. IMPRESSION: 1. Evaluation limited by osteopenia. 2. Given the above limitation, there is no definite acute displaced fracture or dislocation. 3. Advanced degenerative  changes of the first metatarsophalangeal joint. Electronically Signed   By: Constance Holster M.D.   On: 02/20/2019 22:05   Dg Foot 2 Views Right  Result Date: 02/20/2019 CLINICAL DATA:  Pain status post fall EXAM: RIGHT FOOT - 2 VIEW COMPARISON:  02/14/2019 FINDINGS: There is an old healed fracture of the fifth metatarsal head. There is a healing fracture of the first metatarsal base. There is extensive osteopenia which limits detection of nondisplaced fractures.  There are advanced degenerative changes of the first metatarsophalangeal joint. There are advanced degenerative changes of the midfoot and hindfoot. Vascular calcifications are noted. IMPRESSION: Stable appearance of the foot when compared to February 14, 2019. Detection of acute nondisplaced fractures is limited by diffuse osteopenia. Electronically Signed   By: Constance Holster M.D.   On: 02/20/2019 22:07    Procedures Procedures (including critical care time)  Medications Ordered in ED Medications  HYDROcodone-acetaminophen (NORCO) 10-325 MG per tablet 1 tablet (1 tablet Oral Given 02/20/19 1937)     Initial Impression / Assessment and Plan / ED Course  I have reviewed the triage vital signs and the nursing notes.  Pertinent labs & imaging results that were available during my care of the patient were reviewed by me and considered in my medical decision making (see chart for details).        Patient here for evaluation of injuries following a fall last week. He has a healing contusion to the left forehead. He also has swelling, ecchymosis and tenderness to the right thumb. Unable to access records from other hospital visit is unclear if he had imaging or evaluation for these injuries. Imaging here demonstrates no evidence of acute intracranial hemorrhage. He does have a subacute and healing fracture to the first metacarpal. Discussed with patient and wife findings of images. Given his swelling and tenderness to the first metacarpal will place and thumb Spica splint. Discussed with patient outpatient follow-up.  Final Clinical Impressions(s) / ED Diagnoses   Final diagnoses:  Fall, initial encounter  Contusion of face, initial encounter  Contusion of right hand, initial encounter  Closed nondisplaced fracture of first metacarpal bone of right hand, unspecified portion of metacarpal, initial encounter    ED Discharge Orders    None       Quintella Reichert, MD 02/20/19 2330

## 2019-02-20 NOTE — Progress Notes (Signed)
Orthopedic Tech Progress Note Patient Details:  Bryce Berry 1940-12-14 [id number] Pt wanted a velcro thumb spica on. Ortho Devices Type of Ortho Device: Thumb velcro splint Ortho Device/Splint Location: Rt thumb and wrist Ortho Device/Splint Interventions: Application   Post Interventions Patient Tolerated: Well Instructions Provided: Adjustment of device, Care of device   Ladell Pier [facility] 02/20/2019, 11:00 PM

## 2019-02-20 NOTE — ED Triage Notes (Addendum)
Pt was in [facility] last week for HTN. Pt c/o hematoma on left forehead and headache, as well as pain on right thumb. Family describes bruising bilateral legs and laceration on toes. Pt c/o bilateral hip pain as well. Pt fell out of bed in ER at [facility] [place] 7/1.  Pt will need caregiver at bedside . [place] Derry wife POA with pt.  Pt is wheelchair bound

## 2019-02-20 NOTE — Discharge Instructions (Signed)
You have a healing fracture to the right thumb. You may wear the splint for comfort. Please follow-up with orthopedics.

## 2019-02-20 NOTE — ED Notes (Signed)
Patient transported to radiology

## 2019-02-20 NOTE — ED Notes (Addendum)
Patient moved from power chair to bed using hoyer lift. Radiology notified. Will continue to monitor patient.

## 2019-03-16 ENCOUNTER — Other Ambulatory Visit: Payer: Self-pay | Admitting: Internal Medicine

## 2019-03-16 ENCOUNTER — Other Ambulatory Visit (HOSPITAL_COMMUNITY): Payer: Self-pay | Admitting: Internal Medicine

## 2019-03-28 NOTE — Progress Notes (Signed)
Cardiology Office Note   Date:  03/29/2019   ID:  SEVEN DOLLENS, DOB 1941-06-14, MRN [id number]  PCP:  Glenda Chroman, MD Cardiologist:  Rozann Lesches, MD 06/15/2018 Rosaria Ferries, PA-C   No chief complaint on file.   History of Present Illness: Bryce Berry is a 78 y.o. male with a history of bilat PE 2014, ?Aflutter, PCI [place] (1) & [facility] (3) approx 2005, BPH, HTN, HLD, GERD/GIB, COPD, PTSD, PAD by Dopplers per PCP 2010, Myoview normal 02/2012  Bryce Berry presents for cardiology follow up.  He feels he is doing so-so.  He is wheelchair-bound chronically.  Has a Hoyer lift at home to get him in/out of bed.  Breathing is raspy, sometimes has a cough that is sometimes productive, sometimes dry. He has COPD, wonders if that is the problem. Generally feels like his breathing is at baseline.  His BP was up in July, SBP 190s at times. She took him to the ER and while there, he fell out of bed. Had a hematoma upper L forehead, still a knot there.   Feels his breathing is pretty good. DOE is at baseline, denies orthopnea or PND.  She is taking good care of him. She still puts some salt in food to make sure he will eat it, but does not overdo it.   He does not get sustained palpitations, occ feels a skipped beat. Denies presyncope or syncope.  He had one episode about 6 months ago, where he had some symptoms, got tired and went to bed.  Those symptoms resolved.  They have not happened since.  Does not wake with LE edema, sometimes gets it during the day.  Is in a wheelchair chronically due to MS issues w/ hips and knees. Works with PT, but is not improving.   Does not get CP. Does not move around much. Moving around in the bed is the most strenuous thing he does.    Past Medical History:  Diagnosis Date  . Atrial fibrillation ([place])   . Atrial flutter ([place])    Possible history in the past - details unclear  . Bilateral pulmonary embolism ([place])    a. 10/2012 - V:Q  scan high prob for Bilat PE in setting of dyspnea/hemoptysis-->anticoagulation started.  Marland Kitchen BPH (benign prostatic hyperplasia)   . CAD (coronary artery disease)    Stent PCI in [place] and also [place] in the past - details not clear  . Chronic anticoagulation   . Chronic pain   . 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 COPD (chronic obstructive pulmonary disease) ([place])   . Depression   . Dyslipidemia   . Essential hypertension   . GERD (gastroesophageal reflux disease)   . GI bleed   . Gout   . Hyperlipidemia    Mixed  . Irritable bowel   . Osteoarthritis   . Osteoporosis   . Peripheral arterial disease ([place])    Dopplers Dr.Vyas office October, 2010, apparent occlusion anterior tibial arteries bilaterally., findings suggest hemodynamically significant stenosis of the proximal right provisional femoral artery and the mid left superficial artery.  Ankle-brachial indices indicate moderate stenosis within both lower extremities  . Posttraumatic stress disorder   . Recurrent UTI (urinary tract infection)   . Right leg DVT ([place])    a. 10/2012 - subocclusive DVT R popliteal vein.  . Wheelchair bound     Past Surgical History:  Procedure Laterality Date  . BACK SURGERY  [place]  . CATARACT EXTRACTION    . COLON RESECTION    . COLONOSCOPY N/A 03/13/2013   Procedure: COLONOSCOPY;  Surgeon: Rogene Houston, MD;  Location: [facility];  Service: Endoscopy;  Laterality: N/A;  730  . COLONOSCOPY N/A 12/22/2014   Procedure: COLONOSCOPY;  Surgeon: Rogene Houston, MD;  Location: [facility];  Service: Endoscopy;  Laterality: N/A;  . CORONARY ANGIOPLASTY WITH STENT PLACEMENT     x 4  . GIVENS CAPSULE STUDY N/A 12/24/2014   Procedure: GIVENS CAPSULE STUDY;  Surgeon: Rogene Houston, MD;  Location: [facility];  Service: Endoscopy;  Laterality: N/A;  . KNEE ARTHROPLASTY     Right  . PROSTATE SURGERY    . TOTAL HIP ARTHROPLASTY     Right-complicated hx  . TOTAL  KNEE ARTHROPLASTY     Left    Current Outpatient Medications  Medication Sig Dispense Refill  . albuterol (PROVENTIL) (2.5 MG/3ML) 0.083% nebulizer solution Take 2.5 mg by nebulization every 6 (six) hours as needed for wheezing or shortness of breath.     . allopurinol (ZYLOPRIM) 100 MG tablet Take 100 mg by mouth every evening.     Jearl Klinefelter ELLIPTA 62.5-25 MCG/INH AEPB Inhale 1 puff into the lungs daily.    Marland Kitchen ascorbic acid (VITAMIN C) 500 MG tablet Take 500 mg by mouth daily.     . benzonatate (TESSALON) 100 MG capsule Take 100 mg by mouth 3 (three) times daily as needed for cough.     . calcium-vitamin D (OSCAL WITH D) 500-200 MG-UNIT per tablet Take 1 tablet by mouth daily.     . carbidopa-levodopa (SINEMET IR) 25-100 MG tablet Take 1 tablet by mouth 2 (two) times a day.    Marland Kitchen COLCRYS 0.6 MG tablet Take 1 tablet by mouth daily as needed (gout pain).     Marland Kitchen diclofenac sodium (VOLTAREN) 1 % GEL Apply 1 application topically daily. Applied to feet    . dicyclomine (BENTYL) 10 MG capsule Take 1 capsule (10 mg total) by mouth daily. 30 capsule 3  . digoxin (LANOXIN) 0.125 MG tablet Take 1 tablet (0.125 mg total) by mouth daily. 5 tablet 0  . diltiazem (CARDIZEM CD) 120 MG 24 hr capsule Take 1 capsule (120 mg total) by mouth daily. As directed based off blood pressure readings 30 capsule 1  . fenofibrate (TRICOR) 145 MG tablet Take 1 tablet (145 mg total) by mouth daily. (Patient taking differently: Take 145 mg by mouth every evening. ) 30 tablet 6  . fentaNYL (DURAGESIC - DOSED MCG/HR) 25 MCG/HR patch Place 25 mcg onto the skin every 3 (three) days.    . ferrous sulfate 325 (65 FE) MG tablet Take 325 mg by mouth daily with breakfast.    . finasteride (PROSCAR) 5 MG tablet Take 5 mg by mouth daily.      . furosemide (LASIX) 40 MG tablet Take 40 mg by mouth every evening. *May take 80mg  as needed for swelling    . HYDROcodone-acetaminophen (NORCO) 10-325 MG tablet Take 1 tablet by mouth every 6 (six)  hours as needed for moderate pain or severe pain.     Marland Kitchen ipratropium-albuterol (DUONEB) 0.5-2.5 (3) MG/3ML SOLN Take 3 mLs by nebulization 3 (three) times daily. 360 mL 6  . lidocaine (LIDODERM) 5 % Place 1 patch onto the skin daily as needed (for pain). Every 12 hours as needed    . lisinopril (PRINIVIL,ZESTRIL) 20 MG tablet Take 20 mg by  mouth daily. Take one tablet by mouth if blood pressure is over 150    . magnesium oxide (MAG-OX) 400 (241.3 MG) MG tablet Take 1 tablet (400 mg total) by mouth 2 (two) times daily. 60 tablet 2  . metoprolol tartrate (LOPRESSOR) 50 MG tablet Take 50 mg by mouth 2 (two) times daily.    . Multiple Vitamin (MULTIVITAMIN) tablet Take 1 tablet by mouth daily.      . nitroGLYCERIN (NITROSTAT) 0.4 MG SL tablet Place 1 tablet (0.4 mg total) under the tongue every 5 (five) minutes x 3 doses as needed. 25 tablet 3  . Omega-3 Fatty Acids (FISH OIL) 1000 MG CAPS Take 2 capsules by mouth daily.     . pantoprazole (PROTONIX) 40 MG tablet Take 40 mg by mouth 2 (two) times daily.     . potassium chloride SA (K-DUR,KLOR-CON) 20 MEQ tablet Take 2 tablets (40 mEq total) by mouth daily. (Patient taking differently: Take 20 mEq by mouth daily. *Give additional tablet if Lasix is increased due to swelling) 30 tablet 0  . PROAIR HFA 108 (90 Base) MCG/ACT inhaler Inhale 2 puffs into the lungs 2 (two) times daily as needed for wheezing or shortness of breath. 2 puffs twice daily    . rivaroxaban (XARELTO) 20 MG TABS tablet Take 1 tablet (20 mg total) by mouth daily. 30 tablet 10  . rivastigmine (EXELON) 9.5 mg/24hr APPLY 1 PATCH DAILY. 90 patch 6  . tamsulosin (FLOMAX) 0.4 MG CAPS capsule Take 0.4 mg by mouth daily after supper.    Marland Kitchen atorvastatin (LIPITOR) 80 MG tablet Take 80 mg by mouth every evening.      No current facility-administered medications for this visit.     Allergies:   Latex, Omeprazole, Other, Penicillins, Sulfonamide derivatives, and Iodine    Social History:  The  patient  reports that he quit smoking about 15 years ago. His smoking use included cigarettes. He started smoking about 62 years ago. He has a 30.00 pack-year smoking history. He has never used smokeless tobacco. He reports that he does not drink alcohol or use drugs.   Family History:  The patient's family history includes Cancer in his maternal grandmother; Diabetes in his daughter; Hypertension in his daughter; Obesity in his daughter.  He indicated that his mother is deceased. He indicated that his father is deceased. He indicated that the status of his maternal grandmother is unknown. He indicated that his daughter is alive. He indicated that his other is alive.    ROS:  Please see the history of present illness. All other systems are reviewed and negative.    PHYSICAL EXAM: VS:  BP 115/75   Pulse (!) 58   Ht 6\' 2"  (1.88 m)   Wt 290 lb (131.5 kg)   SpO2 99%   BMI 37.23 kg/m  , BMI Body mass index is 37.23 kg/m. GEN: Morbidly obese, chronically ill-appearing, male in no acute distress HEENT: normal for age  Neck: no JVD, no carotid bruit, no masses Cardiac: RRR; soft murmur, no rubs, or gallops Respiratory: Decreased breath sounds bases with a few scattered rails bilaterally, no wheezing, normal work of breathing GI: soft, nontender, nondistended, + BS MS: no deformity, + atrophy; no edema; distal pulses are 1-2+ in all 4 extremities, he may be developing contractures in his hands, R>L Skin: warm and dry, no rash Neuro:  Strength and sensation are intact Psych: euthymic mood, full affect   EKG:  EKG is ordered today. The ekg  ordered today demonstrates sinus rhythm with first-degree AV block, heart rate 60, PR interval 228 ms, generally decreased voltage is noted, similar to 2018 ECG, but unlike 2019 ECG when he had a right bundle  ECHO: 02/11/2017 - Left ventricle: The cavity size was normal. Wall thickness was   increased in a pattern of mild LVH. Systolic function was    vigorous. The estimated ejection fraction was in the range of 65%   to 70%. Wall motion was normal; there were no regional wall   motion abnormalities. The study is not technically sufficient to   allow evaluation of LV diastolic function. - Aortic valve: Mildly calcified annulus. Trileaflet; mildly   calcified leaflets. Noncoronary cusp mobility was restricted. - Mitral valve: Mildly calcified annulus. There was trivial   regurgitation. - Right atrium: Central venous pressure (est): 3 mm Hg. - Tricuspid valve: There was trivial regurgitation. - Pulmonary arteries: Systolic pressure could not be accurately   estimated. - Pericardium, extracardiac: A prominent pericardial fat pad was   present.  Impressions:  - Mild LVH with LVEF 65-70%. Indeterminate diastolic function.   Mildly calcified mitral annulus with trivial mitral   regurgitation. Sclerotic aortic valve with reduced non-coronary   cusp excursion, no significant aortic regurgitation. Trivial   tricuspid regurgitation.  Recent Labs: No results found for requested labs within last 8760 hours.  CBC    Component Value Date/Time   WBC 10.1 02/11/2017 0744   RBC 3.76 (L) 02/11/2017 0744   HGB 11.9 (L) 02/11/2017 0744   HCT 35.1 (L) 02/11/2017 0744   PLT 150 02/11/2017 0744   MCV 93.4 02/11/2017 0744   MCH 31.6 02/11/2017 0744   MCHC 33.9 02/11/2017 0744   RDW 18.3 (H) 02/11/2017 0744   LYMPHSABS 1.1 02/10/2017 1741   MONOABS 0.6 02/10/2017 1741   EOSABS 0.1 02/10/2017 1741   BASOSABS 0.0 02/10/2017 1741   CMP Latest Ref Rng & Units 02/12/2017 02/11/2017 02/10/2017  Glucose 65 - 99 mg/dL 146(H) 149(H) 169(H)  BUN 6 - 20 mg/dL 23(H) 19 17  Creatinine 0.61 - 1.24 mg/dL 1.01 0.91 0.99  Sodium 135 - 145 mmol/L 134(L) 135 135  Potassium 3.5 - 5.1 mmol/L 5.1 3.7 3.3(L)  Chloride 101 - 111 mmol/L 100(L) 100(L) 101  CO2 22 - 32 mmol/L 26 26 23   Calcium 8.9 - 10.3 mg/dL 8.7(L) 8.8(L) 8.9  Total Protein 6.5 - 8.1 g/dL -  5.1(L) -  Total Bilirubin 0.3 - 1.2 mg/dL - 1.2 -  Alkaline Phos 38 - 126 U/L - 57 -  AST 15 - 41 U/L - 28 -  ALT 17 - 63 U/L - 24 -     Lipid Panel No results found for: CHOL, HDL, LDLCALC, LDLDIRECT, TRIG, CHOLHDL    Wt Readings from Last 3 Encounters:  03/29/19 290 lb (131.5 kg)  02/20/19 298 lb (135.2 kg)  06/23/17 268 lb (121.6 kg)     Other studies Reviewed: Additional studies/ records that were reviewed today include: Office notes, hospital records and testing.  ASSESSMENT AND PLAN:  1.  Bilateral PE, last in 2016 and chronic right femoral DVT: - He is on Xarelto, continue this  2.  Hypertension: -His blood pressure is well controlled on current therapy, no changes  3.  Atrial fibrillation: -  he has palpitations at times.  His ECG today is sinus rhythm.  His wife does not report any history of elevated heart rate or bradycardia. - Continue metoprolol 50 mg twice daily  4.  CAD: -No PCI since 2005. - No recent stress test, but EF was normal in 2018 echo. - He is not on ASA because of the Xarelto.  Continue beta-blocker, sublingual nitroglycerin as needed. - He is not currently on a statin, lipids followed by his PCP.   Current medicines are reviewed at length with the patient today.  The patient does not have concerns regarding medicines.  The following changes have been made:  no change  Labs/ tests ordered today include:   Orders Placed This Encounter  Procedures  . EKG 12-Lead     Disposition:   FU with Rozann Lesches, MD in 1 year  Signed, Rosaria Ferries, PA-C  03/29/2019 1:21 PM    [place] Phone: [phone]; Fax: [phone]

## 2019-03-29 ENCOUNTER — Other Ambulatory Visit: Payer: Self-pay

## 2019-03-29 ENCOUNTER — Encounter: Payer: Self-pay | Admitting: Physician Assistant

## 2019-03-29 ENCOUNTER — Ambulatory Visit (INDEPENDENT_AMBULATORY_CARE_PROVIDER_SITE_OTHER): Payer: Medicare Other | Admitting: Physician Assistant

## 2019-03-29 ENCOUNTER — Encounter: Payer: Self-pay | Admitting: *Deleted

## 2019-03-29 VITALS — BP 115/75 | HR 58 | Ht 74.0 in | Wt 290.0 lb

## 2019-03-29 DIAGNOSIS — I480 Paroxysmal atrial fibrillation: Secondary | ICD-10-CM

## 2019-03-29 DIAGNOSIS — I10 Essential (primary) hypertension: Secondary | ICD-10-CM | POA: Diagnosis not present

## 2019-03-29 DIAGNOSIS — Z86718 Personal history of other venous thrombosis and embolism: Secondary | ICD-10-CM | POA: Diagnosis not present

## 2019-03-29 DIAGNOSIS — I2510 Atherosclerotic heart disease of native coronary artery without angina pectoris: Secondary | ICD-10-CM

## 2019-03-29 MED ORDER — DILTIAZEM HCL ER COATED BEADS 120 MG PO CP24: 120.0000 mg | ORAL_CAPSULE | Freq: Every day | ORAL | 2 refills | Status: AC

## 2019-03-29 MED ORDER — DIGOXIN 125 MCG PO TABS: 0.1250 mg | ORAL_TABLET | Freq: Every day | ORAL | 2 refills | Status: AC

## 2019-03-29 MED ORDER — POTASSIUM CHLORIDE CRYS ER 20 MEQ PO TBCR: 20.0000 meq | EXTENDED_RELEASE_TABLET | Freq: Every day | ORAL | 2 refills | Status: AC

## 2019-03-29 NOTE — Patient Instructions (Addendum)
Medication Instructions:   Your physician recommends that you continue on your current medications as directed. Please refer to the Current Medication list given to you today.  Labwork:  NONE  Testing/Procedures:  NONE  Follow-Up:  Your physician recommends that you schedule a follow-up appointment in: 1 year with Dr. Domenic Polite. You will receive a reminder letter in the mail in about 10 months reminding you to call and schedule your appointment. If you don't receive this letter, please contact our office.  Any Other Special Instructions Will Be Listed Below (If Applicable).  Please call our office if your symptoms worsen.  If you need a refill on your cardiac medications before your next appointment, please call your pharmacy.

## 2019-04-19 ENCOUNTER — Other Ambulatory Visit (INDEPENDENT_AMBULATORY_CARE_PROVIDER_SITE_OTHER): Payer: Self-pay | Admitting: Nurse Practitioner

## 2019-04-19 DIAGNOSIS — K580 Irritable bowel syndrome with diarrhea: Secondary | ICD-10-CM

## 2019-04-19 MED ORDER — DICYCLOMINE HCL 10 MG PO CAPS: 10.0000 mg | ORAL_CAPSULE | Freq: Two times a day (BID) | ORAL | 0 refills | Status: AC | PRN

## 2019-06-04 ENCOUNTER — Observation Stay (HOSPITAL_COMMUNITY)
Admission: EM | Admit: 2019-06-04 | Discharge: 2019-06-05 | Disposition: A | Discharge status: Home / Self Care | Payer: Medicare Other | Attending: Internal Medicine | Admitting: Internal Medicine

## 2019-06-04 ENCOUNTER — Encounter (HOSPITAL_COMMUNITY): Payer: Self-pay | Admitting: Emergency Medicine

## 2019-06-04 ENCOUNTER — Emergency Department (HOSPITAL_COMMUNITY): Payer: Medicare Other

## 2019-06-04 ENCOUNTER — Other Ambulatory Visit: Payer: Self-pay

## 2019-06-04 DIAGNOSIS — Z96652 Presence of left artificial knee joint: Secondary | ICD-10-CM | POA: Diagnosis not present

## 2019-06-04 DIAGNOSIS — Z7901 Long term (current) use of anticoagulants: Secondary | ICD-10-CM | POA: Insufficient documentation

## 2019-06-04 DIAGNOSIS — J168 Pneumonia due to other specified infectious organisms: Secondary | ICD-10-CM | POA: Diagnosis not present

## 2019-06-04 DIAGNOSIS — Z79899 Other long term (current) drug therapy: Secondary | ICD-10-CM | POA: Diagnosis not present

## 2019-06-04 DIAGNOSIS — I82491 Acute embolism and thrombosis of other specified deep vein of right lower extremity: Secondary | ICD-10-CM | POA: Insufficient documentation

## 2019-06-04 DIAGNOSIS — R1013 Epigastric pain: Secondary | ICD-10-CM | POA: Diagnosis not present

## 2019-06-04 DIAGNOSIS — I4891 Unspecified atrial fibrillation: Secondary | ICD-10-CM | POA: Insufficient documentation

## 2019-06-04 DIAGNOSIS — K5900 Constipation, unspecified: Secondary | ICD-10-CM | POA: Diagnosis present

## 2019-06-04 DIAGNOSIS — R109 Unspecified abdominal pain: Secondary | ICD-10-CM | POA: Diagnosis present

## 2019-06-04 DIAGNOSIS — Z96641 Presence of right artificial hip joint: Secondary | ICD-10-CM | POA: Insufficient documentation

## 2019-06-04 DIAGNOSIS — I129 Hypertensive chronic kidney disease with stage 1 through stage 4 chronic kidney disease, or unspecified chronic kidney disease: Secondary | ICD-10-CM | POA: Insufficient documentation

## 2019-06-04 DIAGNOSIS — Z20828 Contact with and (suspected) exposure to other viral communicable diseases: Secondary | ICD-10-CM | POA: Insufficient documentation

## 2019-06-04 DIAGNOSIS — J189 Pneumonia, unspecified organism: Secondary | ICD-10-CM

## 2019-06-04 DIAGNOSIS — J449 Chronic obstructive pulmonary disease, unspecified: Secondary | ICD-10-CM | POA: Diagnosis not present

## 2019-06-04 DIAGNOSIS — N189 Chronic kidney disease, unspecified: Secondary | ICD-10-CM | POA: Diagnosis not present

## 2019-06-04 DIAGNOSIS — R079 Chest pain, unspecified: Secondary | ICD-10-CM

## 2019-06-04 DIAGNOSIS — Z87891 Personal history of nicotine dependence: Secondary | ICD-10-CM | POA: Insufficient documentation

## 2019-06-04 DIAGNOSIS — I259 Chronic ischemic heart disease, unspecified: Secondary | ICD-10-CM | POA: Insufficient documentation

## 2019-06-04 LAB — COMPREHENSIVE METABOLIC PANEL
ALT: 8 U/L (ref 0–44)
AST: 24 U/L (ref 15–41)
Albumin: 2.6 g/dL — ABNORMAL LOW (ref 3.5–5.0)
Alkaline Phosphatase: 66 U/L (ref 38–126)
Anion gap: 8 (ref 5–15)
BUN: 26 mg/dL — ABNORMAL HIGH (ref 8–23)
CO2: 27 mmol/L (ref 22–32)
Calcium: 9.2 mg/dL (ref 8.9–10.3)
Chloride: 104 mmol/L (ref 98–111)
Creatinine, Ser: 0.61 mg/dL (ref 0.61–1.24)
GFR calc Af Amer: >60 mL/min (ref >60)
GFR calc non Af Amer: >60 mL/min (ref >60)
Glucose, Bld: 143 mg/dL — ABNORMAL HIGH (ref 70–99)
Potassium: 4.1 mmol/L (ref 3.5–5.1)
Sodium: 139 mmol/L (ref 135–145)
Total Bilirubin: 0.9 mg/dL (ref 0.3–1.2)
Total Protein: 6 g/dL — ABNORMAL LOW (ref 6.5–8.1)

## 2019-06-04 LAB — LACTIC ACID, PLASMA: Lactic Acid, Venous: 1.7 mmol/L (ref 0.5–1.9)

## 2019-06-04 LAB — CBC WITH DIFFERENTIAL/PLATELET
Abs Immature Granulocytes: 0.09 10*3/uL — ABNORMAL HIGH (ref 0.00–0.07)
Basophils Absolute: 0 10*3/uL (ref 0.0–0.1)
Basophils Relative: 0 %
Eosinophils Absolute: 0.1 10*3/uL (ref 0.0–0.5)
Eosinophils Relative: 1 %
HCT: 35.3 % — ABNORMAL LOW (ref 39.0–52.0)
Hemoglobin: 11.1 g/dL — ABNORMAL LOW (ref 13.0–17.0)
Immature Granulocytes: 1 %
Lymphocytes Relative: 15 %
Lymphs Abs: 2.3 10*3/uL (ref 0.7–4.0)
MCH: 31 pg (ref 26.0–34.0)
MCHC: 31.4 g/dL (ref 30.0–36.0)
MCV: 98.6 fL (ref 80.0–100.0)
Monocytes Absolute: 1.5 10*3/uL — ABNORMAL HIGH (ref 0.1–1.0)
Monocytes Relative: 10 %
Neutro Abs: 11.2 10*3/uL — ABNORMAL HIGH (ref 1.7–7.7)
Neutrophils Relative %: 73 %
Platelets: 150 10*3/uL (ref 150–400)
RBC: 3.58 MIL/uL — ABNORMAL LOW (ref 4.22–5.81)
RDW: 15.7 % — ABNORMAL HIGH (ref 11.5–15.5)
WBC: 15.2 10*3/uL — ABNORMAL HIGH (ref 4.0–10.5)
nRBC: 0 % (ref 0.0–0.2)

## 2019-06-04 LAB — TROPONIN I (HIGH SENSITIVITY)
Troponin I (High Sensitivity): 7 ng/L (ref <18)
Troponin I (High Sensitivity): 6 ng/L (ref <18)

## 2019-06-04 LAB — LIPASE, BLOOD: Lipase: 16 U/L (ref 11–51)

## 2019-06-04 MED ORDER — ACETAMINOPHEN 325 MG PO TABS: 650.0000 mg | ORAL_TABLET | Freq: Four times a day (QID) | ORAL | Status: DC | PRN

## 2019-06-04 MED ORDER — RIVAROXABAN 20 MG PO TABS
20.0000 mg | ORAL_TABLET | Freq: Every day | ORAL | Status: DC
  Administered 2019-06-04 – 2019-06-05 (×2): 20 mg via ORAL
  Filled 2019-06-04 (×2): qty 1

## 2019-06-04 MED ORDER — IOHEXOL 300 MG/ML  SOLN
100.0000 mL | Freq: Once | INTRAMUSCULAR | Status: AC | PRN
  Administered 2019-06-04: 100 mL via INTRAVENOUS

## 2019-06-04 MED ORDER — FINASTERIDE 5 MG PO TABS
5.0000 mg | ORAL_TABLET | Freq: Every day | ORAL | Status: DC
  Administered 2019-06-04 – 2019-06-05 (×2): 5 mg via ORAL
  Filled 2019-06-04 (×2): qty 1

## 2019-06-04 MED ORDER — SENNOSIDES-DOCUSATE SODIUM 8.6-50 MG PO TABS
1.0000 | ORAL_TABLET | Freq: Two times a day (BID) | ORAL | Status: AC
  Administered 2019-06-04 – 2019-06-05 (×2): 1 via ORAL
  Filled 2019-06-04 (×2): qty 1

## 2019-06-04 MED ORDER — ONDANSETRON HCL 4 MG PO TABS: 4.0000 mg | ORAL_TABLET | Freq: Four times a day (QID) | ORAL | Status: DC | PRN

## 2019-06-04 MED ORDER — SODIUM CHLORIDE 0.9 % IV SOLN
2.0000 g | Freq: Three times a day (TID) | INTRAVENOUS | Status: DC
  Administered 2019-06-04 – 2019-06-05 (×2): 2 g via INTRAVENOUS
  Filled 2019-06-04 (×6): qty 2

## 2019-06-04 MED ORDER — VANCOMYCIN HCL IN DEXTROSE 1-5 GM/200ML-% IV SOLN
1000.0000 mg | Freq: Two times a day (BID) | INTRAVENOUS | Status: DC
  Administered 2019-06-05: 1000 mg via INTRAVENOUS
  Filled 2019-06-04 (×2): qty 200

## 2019-06-04 MED ORDER — DILTIAZEM HCL ER COATED BEADS 120 MG PO CP24
120.0000 mg | ORAL_CAPSULE | Freq: Every day | ORAL | Status: DC
  Administered 2019-06-05: 120 mg via ORAL
  Filled 2019-06-04: qty 1

## 2019-06-04 MED ORDER — POLYETHYLENE GLYCOL 3350 17 G PO PACK
17.0000 g | PACK | Freq: Two times a day (BID) | ORAL | Status: DC
  Administered 2019-06-04 – 2019-06-05 (×2): 17 g via ORAL
  Filled 2019-06-04 (×2): qty 1

## 2019-06-04 MED ORDER — HYDROCODONE-ACETAMINOPHEN 10-325 MG PO TABS
1.0000 | ORAL_TABLET | Freq: Four times a day (QID) | ORAL | Status: DC | PRN
  Administered 2019-06-04: 1 via ORAL
  Filled 2019-06-04: qty 1

## 2019-06-04 MED ORDER — IPRATROPIUM-ALBUTEROL 0.5-2.5 (3) MG/3ML IN SOLN: 3.0000 mL | RESPIRATORY_TRACT | Status: DC | PRN

## 2019-06-04 MED ORDER — METRONIDAZOLE IN NACL 5-0.79 MG/ML-% IV SOLN
500.0000 mg | Freq: Three times a day (TID) | INTRAVENOUS | Status: DC
  Administered 2019-06-04 – 2019-06-05 (×2): 500 mg via INTRAVENOUS
  Filled 2019-06-04 (×3): qty 100

## 2019-06-04 MED ORDER — TRAZODONE HCL 50 MG PO TABS
50.0000 mg | ORAL_TABLET | Freq: Every day | ORAL | Status: DC
  Administered 2019-06-04: 50 mg via ORAL
  Filled 2019-06-04: qty 1

## 2019-06-04 MED ORDER — ACETAMINOPHEN 650 MG RE SUPP: 650.0000 mg | Freq: Four times a day (QID) | RECTAL | Status: DC | PRN

## 2019-06-04 MED ORDER — FLEET ENEMA 7-19 GM/118ML RE ENEM
1.0000 | ENEMA | Freq: Once | RECTAL | Status: AC
  Administered 2019-06-04: 1 via RECTAL

## 2019-06-04 MED ORDER — VANCOMYCIN HCL IN DEXTROSE 1-5 GM/200ML-% IV SOLN
1000.0000 mg | INTRAVENOUS | Status: AC
  Administered 2019-06-04 (×2): 1000 mg via INTRAVENOUS
  Filled 2019-06-04 (×2): qty 200

## 2019-06-04 MED ORDER — ATORVASTATIN CALCIUM 40 MG PO TABS
80.0000 mg | ORAL_TABLET | Freq: Every evening | ORAL | Status: DC
  Administered 2019-06-04 – 2019-06-05 (×2): 80 mg via ORAL
  Filled 2019-06-04 (×2): qty 2

## 2019-06-04 MED ORDER — PANTOPRAZOLE SODIUM 40 MG PO TBEC
40.0000 mg | DELAYED_RELEASE_TABLET | Freq: Two times a day (BID) | ORAL | Status: DC
  Administered 2019-06-04 – 2019-06-05 (×2): 40 mg via ORAL
  Filled 2019-06-04 (×2): qty 1

## 2019-06-04 MED ORDER — ONDANSETRON HCL 4 MG/2ML IJ SOLN: 4.0000 mg | Freq: Four times a day (QID) | INTRAMUSCULAR | Status: DC | PRN

## 2019-06-04 MED ORDER — NITROGLYCERIN 0.4 MG SL SUBL: 0.4000 mg | SUBLINGUAL_TABLET | SUBLINGUAL | Status: DC | PRN

## 2019-06-04 MED ORDER — MAGNESIUM CITRATE PO SOLN: 1.0000 | Freq: Once | ORAL | Status: DC

## 2019-06-04 MED ORDER — SODIUM CHLORIDE 0.9 % IV SOLN
2.0000 g | Freq: Once | INTRAVENOUS | Status: AC
  Administered 2019-06-04: 2 g via INTRAVENOUS
  Filled 2019-06-04 (×2): qty 2

## 2019-06-04 MED ORDER — UMECLIDINIUM-VILANTEROL 62.5-25 MCG/INH IN AEPB
1.0000 | INHALATION_SPRAY | Freq: Every day | RESPIRATORY_TRACT | Status: DC
  Filled 2019-06-04: qty 14

## 2019-06-04 MED ORDER — SODIUM CHLORIDE 0.9 % IV SOLN
INTRAVENOUS | Status: AC
  Administered 2019-06-04: 17:00:00 via INTRAVENOUS

## 2019-06-04 MED ORDER — UMECLIDINIUM-VILANTEROL 62.5-25 MCG/INH IN AEPB: 1.0000 | INHALATION_SPRAY | Freq: Every day | RESPIRATORY_TRACT | Status: DC

## 2019-06-04 MED ORDER — METOPROLOL TARTRATE 50 MG PO TABS: 50.0000 mg | ORAL_TABLET | Freq: Two times a day (BID) | ORAL | Status: DC

## 2019-06-04 NOTE — H&P (Addendum)
History and Physical    CLAUD ISHEE [id number] DOB: [birth year] DOA: 06/04/2019  PCP: Glenda Chroman, MD   Patient coming from: Home  I have personally briefly reviewed patient's old medical records in [place]  Chief Complaint: Abdominal and chest pain  HPI: Bryce Berry is a 78 y.o. male with medical history significant for hypertension, atrial flutter/fibrillation, peripheral vascular disease, COPD, PTSD, coronary artery disease status post PCI 2005, DVT and bilateral PE, mild dementia, wheelchair-bound. Patient was brought to the ED reports of abdominal pain and lower chest pain of 2 days duration.  Patient is awake and alert, can answer a few simple questions appropriately, spouse is at bedside and helps with some of the history.  Patient spouse states patient has not had a bowel movement in at least 2 days.  She tried enema and using her finger in his rectum to break up stool, but this did not help. Patient also reports lower abdominal pain that is burning in sensation, started while he was lying in bed, nonradiating.  He reports some difficulty breathing, but his chronic cough is unchanged and intermittently productive of whitish sputum. Spouse reports a temperature of 99 this morning, no pain with urination.  Patient is wheelchair-bound and unable to bear weight on his lower extremities for at least 5 years, from bilateral hip surgeries and knee surgeries.  ED Course: Blood pressure systolic 0000000 [id number], O2 sats greater than 94% on room air.  Temperature 98.7.  WBC 15.2.  Normal lactic acid 1.7.  At bedtime troponin VII.  Portable chest x-ray shows increased bibasilar pneumonia or aspiration pneumonitis.  CT abdomen and pelvis-large stool burden, increased dependent atelectasis, small bilateral pleural effusions.  Cavitary lesion right upper lobe-now partially opacified with fluid possibly due to hemorrhage or superinfection.  Patient was started on IV vancomycin and aztreonam.   Hospitalist to admit for above problems.  Review of Systems: As per HPI all other systems reviewed and negative.  Past Medical History:  Diagnosis Date  . Atrial fibrillation ([place])   . Atrial flutter ([place])    Possible history in the past - details unclear  . Bilateral pulmonary embolism ([place])    a. 10/2012 - V:Q scan high prob for Bilat PE in setting of dyspnea/hemoptysis-->anticoagulation started.  Marland Kitchen BPH (benign prostatic hyperplasia)   . CAD (coronary artery disease)    Stent PCI in [place] and also [place] in the past - details not clear  . Chronic anticoagulation   . Chronic pain   . 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 COPD (chronic obstructive pulmonary disease) ([place])   . Depression   . Dyslipidemia   . Essential hypertension   . GERD (gastroesophageal reflux disease)   . GI bleed   . Gout   . Hyperlipidemia    Mixed  . Irritable bowel   . Osteoarthritis   . Osteoporosis   . Peripheral arterial disease ([place])    Dopplers Dr.Vyas office October, 2010, apparent occlusion anterior tibial arteries bilaterally., findings suggest hemodynamically significant stenosis of the proximal right provisional femoral artery and the mid left superficial artery.  Ankle-brachial indices indicate moderate stenosis within both lower extremities  . Posttraumatic stress disorder   . Recurrent UTI (urinary tract infection)   . Right leg DVT ([place])    a. 10/2012 - subocclusive DVT R popliteal vein.  . Wheelchair bound     Past Surgical History:  Procedure Laterality Date  . BACK SURGERY  [place]  . CATARACT EXTRACTION    . COLON RESECTION    . COLONOSCOPY N/A 03/13/2013   Procedure: COLONOSCOPY;  Surgeon: Rogene Houston, MD;  Location: [facility];  Service: Endoscopy;  Laterality: N/A;  730  . COLONOSCOPY N/A 12/22/2014   Procedure: COLONOSCOPY;  Surgeon: Rogene Houston, MD;  Location: [facility];  Service: Endoscopy;  Laterality: N/A;  . CORONARY ANGIOPLASTY  WITH STENT PLACEMENT     x 4  . GIVENS CAPSULE STUDY N/A 12/24/2014   Procedure: GIVENS CAPSULE STUDY;  Surgeon: Rogene Houston, MD;  Location: [facility];  Service: Endoscopy;  Laterality: N/A;  . KNEE ARTHROPLASTY     Right  . PROSTATE SURGERY    . TOTAL HIP ARTHROPLASTY     Right-complicated hx  . TOTAL KNEE ARTHROPLASTY     Left     reports that he quit smoking about 15 years ago. His smoking use included cigarettes. He started smoking about 62 years ago. He has a 30.00 pack-year smoking history. He has never used smokeless tobacco. He reports that he does not drink alcohol or use drugs.  Allergies  Allergen Reactions  . Latex   . Omeprazole Other (See Comments)    Reaction unknown-possible upset stomach  . Other     Paper tape  . Penicillins Other (See Comments)    Has patient had a PCN reaction causing immediate rash, facial/tongue/throat swelling, SOB or lightheadedness with hypotension: No Has patient had a PCN reaction causing severe rash involving mucus membranes or skin necrosis: No Has patient had a PCN reaction that required hospitalization No Has patient had a PCN reaction occurring within the last 10 years: No If all of the above answers are "NO", then may proceed with Cephalosporin use.    Rapid heart rate; flushed  . Sulfonamide Derivatives Other (See Comments)    Rapid heart rate; flushed  . Iodine Rash    Family History  Problem Relation Age of Onset  . Diabetes Daughter   . Hypertension Daughter   . Obesity Daughter   . Cancer Maternal Grandmother     Prior to Admission medications   Medication Sig Start Date End Date Taking? Authorizing Provider  albuterol (PROVENTIL) (2.5 MG/3ML) 0.083% nebulizer solution Take 2.5 mg by nebulization every 6 (six) hours as needed for wheezing or shortness of breath.  01/11/17   [provider]  allopurinol (ZYLOPRIM) 100 MG tablet Take 100 mg by mouth every evening.     [provider]  ANORO  ELLIPTA 62.5-25 MCG/INH AEPB Inhale 1 puff into the lungs daily. 04/19/16   [provider]  ascorbic acid (VITAMIN C) 500 MG tablet Take 500 mg by mouth daily.     [provider]  atorvastatin (LIPITOR) 80 MG tablet Take 80 mg by mouth every evening.     [provider]  benzonatate (TESSALON) 100 MG capsule Take 100 mg by mouth 3 (three) times daily as needed for cough.  01/06/17   [provider]  calcium-vitamin D (OSCAL WITH D) 500-200 MG-UNIT per tablet Take 1 tablet by mouth daily.     [provider]  carbidopa-levodopa (SINEMET IR) 25-100 MG tablet Take 1 tablet by mouth 2 (two) times a day.    [provider]  COLCRYS 0.6 MG tablet Take 1 tablet by mouth daily as needed (gout pain).  07/07/15   [provider]  diclofenac sodium (VOLTAREN) 1 % GEL Apply 1 application topically  daily. Applied to feet 05/20/15   [provider]  dicyclomine (BENTYL) 10 MG capsule Take 1 capsule (10 mg total) by mouth daily. 12/11/18   Setzer, Rona Ravens, NP  dicyclomine (BENTYL) 10 MG capsule Take 1 capsule (10 mg total) by mouth 2 (two) times daily as needed for up to 14 days for spasms. 04/19/19 05/03/19  Noralyn Pick, NP  digoxin (LANOXIN) 0.125 MG tablet Take 1 tablet (0.125 mg total) by mouth daily. 03/29/19   Barrett, Evelene Croon, PA-C  diltiazem (CARDIZEM CD) 120 MG 24 hr capsule Take 1 capsule (120 mg total) by mouth daily. As directed based off blood pressure readings 03/29/19   Barrett, Evelene Croon, PA-C  fenofibrate (TRICOR) 145 MG tablet Take 1 tablet (145 mg total) by mouth daily. Patient taking differently: Take 145 mg by mouth every evening.  08/16/11   de Stanford Scotland, MD  fentaNYL (DURAGESIC - DOSED MCG/HR) 25 MCG/HR patch Place 25 mcg onto the skin every 3 (three) days.    [provider]  ferrous sulfate 325 (65 FE) MG tablet Take 325 mg by mouth daily with breakfast.    [provider]  finasteride (PROSCAR)  5 MG tablet Take 5 mg by mouth daily.      [provider]  furosemide (LASIX) 40 MG tablet Take 40 mg by mouth every evening. *May take 80mg  as needed for swelling    [provider]  HYDROcodone-acetaminophen (NORCO) 10-325 MG tablet Take 1 tablet by mouth every 6 (six) hours as needed for moderate pain or severe pain.  01/21/17   [provider]  ipratropium-albuterol (DUONEB) 0.5-2.5 (3) MG/3ML SOLN Take 3 mLs by nebulization 3 (three) times daily. 01/20/17   Mannam, Hart Robinsons, MD  lidocaine (LIDODERM) 5 % Place 1 patch onto the skin daily as needed (for pain). Every 12 hours as needed 08/29/15   [provider]  lisinopril (PRINIVIL,ZESTRIL) 20 MG tablet Take 20 mg by mouth daily. Take one tablet by mouth if blood pressure is over 150    [provider]  magnesium oxide (MAG-OX) 400 (241.3 MG) MG tablet Take 1 tablet (400 mg total) by mouth 2 (two) times daily. 01/06/15   Reyne Dumas, MD  metoprolol tartrate (LOPRESSOR) 50 MG tablet Take 50 mg by mouth 2 (two) times daily.    [provider]  Multiple Vitamin (MULTIVITAMIN) tablet Take 1 tablet by mouth daily.      [provider]  nitroGLYCERIN (NITROSTAT) 0.4 MG SL tablet Place 1 tablet (0.4 mg total) under the tongue every 5 (five) minutes x 3 doses as needed. 01/07/15   Carlena Bjornstad, MD  Omega-3 Fatty Acids (FISH OIL) 1000 MG CAPS Take 2 capsules by mouth daily.     [provider]  pantoprazole (PROTONIX) 40 MG tablet Take 40 mg by mouth 2 (two) times daily.     [provider]  potassium chloride SA (K-DUR) 20 MEQ tablet Take 1 tablet (20 mEq total) by mouth daily. 03/29/19   Barrett, Evelene Croon, PA-C  PROAIR HFA 108 (90 Base) MCG/ACT inhaler Inhale 2 puffs into the lungs 2 (two) times daily as needed for wheezing or shortness of breath. 2 puffs twice daily 08/20/15   [provider]  rivaroxaban (XARELTO) 20 MG TABS tablet Take 1 tablet (20 mg total) by mouth  daily. 01/27/15   Reyne Dumas, MD  rivastigmine (EXELON) 9.5 mg/24hr APPLY 1 PATCH DAILY. 03/21/17   Cameron Sprang, MD  tamsulosin (FLOMAX) 0.4 MG CAPS capsule Take 0.4 mg by mouth daily after supper.    [provider]    Physical Exam: Vitals:   06/04/19 1200 06/04/19 1230 06/04/19 1500 06/04/19 1527  BP: 115/67 111/66 90/62 (!) 94/51  Pulse: 67 66  64  Resp: (!) 26 (!) 25 17 18   Temp:      TempSrc:      SpO2: 94% 94%  96%  Weight:   131.5 kg   Height:   6' (1.829 m)     Constitutional: NAD, calm, comfortable Vitals:   06/04/19 1200 06/04/19 1230 06/04/19 1500 06/04/19 1527  BP: 115/67 111/66 90/62 (!) 94/51  Pulse: 67 66  64  Resp: (!) 26 (!) 25 17 18   Temp:      TempSrc:      SpO2: 94% 94%  96%  Weight:   131.5 kg   Height:   6' (1.829 m)    Eyes: PERRL, lids and conjunctivae normal ENMT: Mucous membranes are moist. Posterior pharynx clear of any exudate or lesions. Neck: normal, supple, no masses, no thyromegaly Respiratory: clear to auscultation bilaterally, no wheezing, no crackles. Normal respiratory effort. No accessory muscle use.  Cardiovascular: Mildly bradycardic, regular rate and rhythm, no murmurs / rubs / gallops. No extremity edema. 2+ pedal pulses.  Abdomen: Diffuse mild to moderate abdominal tenderness, abdomen soft,  no masses palpated. No hepatosplenomegaly. Bowel sounds positive.  Musculoskeletal: no clubbing / cyanosis.  Atrophic bilateral lower extremities. Skin: no rashes, lesions, ulcers. No induration Neurologic: CN 2-12 grossly intact.  Chronic 2/5 strength bilateral lower extremities 3/5 bilateral upper extremities. Right worse than left. Psychiatric: Normal judgment and insight. Alert and oriented x 2, able to tell me he is in the hospital only by my suggesting it, unable to tell me why he is in the hospital.. Normal mood.   Labs on Admission: I have personally reviewed following labs and imaging studies  CBC: Recent Labs  Lab  06/04/19 1151  WBC 15.2*  NEUTROABS 11.2*  HGB 11.1*  HCT 35.3*  MCV 98.6  PLT [id number]   Basic Metabolic Panel: Recent Labs  Lab 06/04/19 1151  NA 139  K 4.1  CL 104  CO2 27  GLUCOSE 143*  BUN 26*  CREATININE 0.61  CALCIUM 9.2   Liver Function Tests: Recent Labs  Lab 06/04/19 1151  AST 24  ALT 8  ALKPHOS 66  BILITOT 0.9  PROT 6.0*  ALBUMIN 2.6*   Recent Labs  Lab 06/04/19 1151  LIPASE 16    Radiological Exams on Admission: Ct Abdomen Pelvis W Contrast  Result Date: 06/04/2019 CLINICAL DATA:  Generalized abdominal pain with constipation for 2 days. History partial colon resection, atrial fibrillation and COPD. EXAM: CT ABDOMEN AND PELVIS WITH CONTRAST TECHNIQUE: Multidetector CT imaging of the abdomen and pelvis was performed using the standard protocol following bolus administration of intravenous contrast. CONTRAST:  140mL OMNIPAQUE IOHEXOL 300 MG/ML  SOLN COMPARISON:  Abdominopelvic CT 02/14/2019. FINDINGS: Lower chest: New small bilateral pleural effusions with associated increased dependent atelectasis at both lung bases. There is underlying prominent extrapleural fat deposition posteriorly and a moderate size hiatal hernia. A thin walled cavitary lesion in the right upper lobe seen on the prior chest CT is partially imaged and is now all partially opacified with fluid, measuring up to 2.7 cm on image 2/6. No other pulmonary nodules are identified. There is extensive atherosclerosis of the aorta and coronary arteries. There are possible coronary artery stents.  Hepatobiliary: The liver is normal in density without suspicious focal abnormality. No evidence of gallstones, gallbladder wall thickening or biliary dilatation. Pancreas: Unremarkable. No pancreatic ductal dilatation or surrounding inflammatory changes. Spleen: Normal in size without focal abnormality. Adrenals/Urinary Tract: Both adrenal glands appear normal. There is an indeterminate lesion in the upper pole of the  right kidney, measuring 15 mm on image 24/2. This measures higher than water density and is not a simple cyst. This is not as well seen on the previous noncontrast studies and may be slightly larger. In the interpolar region of the left kidney, there is a simple appearing cyst measuring 2.0 cm on image 40/98. No evidence of urinary tract calculus or hydronephrosis. The bladder is partly obscured by artifact from the hip prostheses, although otherwise appears unremarkable. Stomach/Bowel: No evidence of bowel wall thickening, distention or surrounding inflammatory change. There is a moderate size hiatal hernia. There is a large amount of stool throughout the colon. Postsurgical changes are present within the sigmoid colon. Vascular/Lymphatic: There are no enlarged abdominal or pelvic lymph nodes. Diffuse aortic and branch vessel atherosclerosis. Reproductive: The prostate gland and seminal vesicles are obscured by artifact from the bilateral total hip arthroplasties. Other: The left anterior abdominal wall is diffusely thinned and protuberant, similar to the previous study. No focal hernia identified in this area. Small fat and fluid containing left inguinal hernia is stable. Musculoskeletal: No acute or significant osseous findings. Lumbar spondylosis noted post bilateral total hip arthroplasty. There is diffuse fatty muscular atrophy. IMPRESSION: 1. No acute findings or explanation for the patient's symptoms. There is a large amount of stool throughout the colon consistent with constipation. No evidence of bowel obstruction or perforation. 2. New small bilateral pleural effusions with increased dependent atelectasis at both lung bases. Previously identified thin walled cavitary lesion in the right upper lobe is now partially opacified with fluid, possibly due to hemorrhage or superinfection. Follow-up chest CT within the next 3 months recommended. 3. Indeterminate lesion in the upper pole of the right kidney, not as  well seen on previous noncontrast studies. This could reflect a complex cyst or solid lesion. This could be further evaluated with dedicated renal CT or MRI without and with contrast. 4. Aortic Atherosclerosis (ICD10-I70.0). Electronically Signed   By: Richardean Sale M.D.   On: 06/04/2019 13:43   Dg Chest Port 1 View  Result Date: 06/04/2019 CLINICAL DATA:  Cough. EXAM: PORTABLE CHEST 1 VIEW COMPARISON:  02/14/2019 FINDINGS: Stable enlarged cardiac silhouette. Increased bibasilar airspace opacity, significantly greater on the left compared to the right. Probable small bilateral pleural effusions. Unremarkable bones. IMPRESSION: 1. Increased bibasilar pneumonia, aspiration pneumonitis or pneumonia. 2. Probable small bilateral pleural effusions. 3. Stable cardiomegaly. Electronically Signed   By: Claudie Revering M.D.   On: 06/04/2019 12:56    EKG: Independently reviewed.  Atria Fibrillation, QTc 474.  Old right bundle branch block.  No significant ST or T wave abnormalities.  prior EKGs show sinus rhythm.  Assessment/Plan Active Problems:   Abdominal pain    Abdominal pain/ lower chest pain-with leukocytosis of 17.5.  Likely related to high stool burden as seen on CT versus musculoskeletal chest pain.  History of coronary artery disease but chest pain is atypical.  Hs troponin X 2, unremarkable, EKG unremarkable normal lipase, liver enzymes.  Slight drop in hemoglobin from 12- 2 years ago to 11 today.  Constipation likely secondary to opioids and immobility. -Bowel regimen to include Fleet enema x 1. -Resume home Protonix 40  BID -Tramadol as needed for pain -Repeat EKG a.m.  Cavitary pulmonary lesion- A thin walled cavitary lesion in the right upper lobe seen on the prior chest CT is partially imaged and is now all partially opacified with fluid, measuring up to 2.7 cm.  Leukocytosis of 17.5.  Normal lactic acid 1.7.  -Continue IV vancomycin and aztreonam as started in the ED, add IV metronidazole  considering cavitary lesion.(Penicillin allergy noted, per patient spouse when patient was in the [employer] several years ago he reported that he had a very bad rash after he had been on penicillin for a while. Per pharmacy, he has not being on cephalosporins either) -Follow-up repeat chest CT within the next 3 months as recommended by radiology. -CBC a.m.  Atrial fibrillation/flutter/pulmonary embolism-currently in atrial fibrillation.  Rate controlled and anticoagulated. -Systolic currently in the 90s- 110s, resume home Cardizem and digoxin -Hold home metoprolol resume in a.m. pending stable blood pressure -Resume Eliquis  COPD-reports some difficulty breathing, unchanged cough, exam is unremarkable.  O2 sats > 94% on room air. -DuoNebs as needed, resume home bronchodilators  History of coronary artery disease- f/w with cardiology.  History of PCI 2005, with normal Myoview 2013..  EKG and troponin unremarkable. -Resume home statins.  Dementia, wheelchair-bound, chronic pain -Continue home narcotics for now, he is constipated but he is requesting his pain meds, for ongoing pain -Will need to adjust his bowel regimen on discharge  Hypertension-blood pressure systolic 0000000 to low 0000000. -500 mill bolus, continue normal saline 75cc/hr x 12 hrs. -Hold home Lasix, metoprolol, lisinopril -Resume home Cardizem for rate control.  Indeterminate right kidney lesion-incidental finding on CT abdomen pelvis.  - Indeterminate lesion in the upper pole of the right kidney, not as well seen on previous noncontrast studies.  Measuring 15 mm.  This could reflect a complex cyst or solid lesion. This could be further evaluated with dedicated renal CT or MRI without and with contrast. -Will need follow-up as outpatient  DVT prophylaxis: Xarelto Code Status: Full code Family Communication: Spouse at bedside Disposition Plan: 1 to 2 days Consults called: None Admission status: Obs, telemetry  Bethena Roys MD [facility]  06/04/2019, 5:44 PM

## 2019-06-04 NOTE — Progress Notes (Signed)
Pharmacy Antibiotic Note  Bryce Berry is a 78 y.o. male admitted on 06/04/2019 with pneumonia.  Pharmacy has been consulted for vancomycin dosing.  Plan: Vancomycin 2000mg  loading dose, then 1000mg  IV every 12 hours.  Goal trough 15-20 mcg/mL.  Aztreonam 2gm IV q8h F/U cxs and clinical progress Monitor V/S, labs and levels as indicated  Height: 6' (182.9 cm) Weight: 290 lb (131.5 kg) IBW/kg (Calculated) : 77.6  Temp (24hrs), Avg:98.7 F (37.1 C), Min:98.7 F (37.1 C), Max:98.7 F (37.1 C)  Recent Labs  Lab 06/04/19 1151  WBC 15.2*  CREATININE 0.61  LATICACIDVEN 1.7    Estimated Creatinine Clearance: 110.2 mL/min (by C-G formula based on SCr of 0.61 mg/dL).   Normalized CrCl is 183mls/min  Allergies  Allergen Reactions  . Latex   . Omeprazole Other (See Comments)    Reaction unknown-possible upset stomach  . Other     Paper tape  . Penicillins Other (See Comments)    Has patient had a PCN reaction causing immediate rash, facial/tongue/throat swelling, SOB or lightheadedness with hypotension: No Has patient had a PCN reaction causing severe rash involving mucus membranes or skin necrosis: No Has patient had a PCN reaction that required hospitalization No Has patient had a PCN reaction occurring within the last 10 years: No If all of the above answers are "NO", then may proceed with Cephalosporin use.    Rapid heart rate; flushed  . Sulfonamide Derivatives Other (See Comments)    Rapid heart rate; flushed  . Iodine Rash    Antimicrobials this admission: Vancomycin 10/19>>  Aztreonam 10/19 >>   Microbiology results: 10/19 BCx: pending 10/19 UCx: pending MRSA PCR:   Thank you for allowing pharmacy to be a part of this patient's care.  Isac Sarna, BS Pharm D, BCPS Clinical Pharmacist Pager [phone] 06/04/2019 3:24 PM

## 2019-06-04 NOTE — ED Triage Notes (Signed)
Patient complaining of constipations x2days. Patient complains of a mild cough with sternal pain

## 2019-06-04 NOTE — ED Provider Notes (Signed)
[facility] EMERGENCY DEPARTMENT Provider Note   CSN: [id number] Arrival date & time: 06/04/19  1105     History   Chief Complaint Chief Complaint  Patient presents with   Constipation    HPI Bryce Berry is a 78 y.o. male.     HPI  This patient is a 78 year old male, has a known history of bilateral pulmonary embolism, atrial fibrillation, COPD, hypertension, he is wheelchair-bound.  He lives at home, he is well cared for by family.  Paramedics were called to the patient's residence today because of increasing abdominal pain that came after a fit of coughing from last night.  The pain is located in the epigastrium and the lower chest, the patient points to his lower left parasternal area down towards the xiphoid.  He states it is not worse with deep breathing, it is worse somewhat with palpation of the upper abdomen.  Symptoms are intermittent, became worse this morning, not associated with diarrhea in fact he has not had a bowel movement in a couple of days.  Of note the patient is anticoagulated with oral anticoagulants for history of clot and atrial fibrillation Past Medical History:  Diagnosis Date   Atrial fibrillation [facility])    Atrial flutter ([place])    Possible history in the past - details unclear   Bilateral pulmonary embolism ([place])    a. 10/2012 - V:Q scan high prob for Bilat PE in setting of dyspnea/hemoptysis-->anticoagulation started.   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  Ankle-brachial indices indicate moderate stenosis within both lower extremities   Posttraumatic stress disorder    Recurrent UTI (urinary tract infection)    Right leg DVT ([place])    a. 10/2012 - subocclusive DVT R popliteal vein.   Wheelchair bound     Patient Active Problem List   Diagnosis Date Noted   Syncope 02/10/2017   Mild dementia ([place]) 09/01/2015   Generalized weakness 07/30/2015   Acute encephalopathy 07/29/2015   UTI (lower urinary tract infection) 07/29/2015   PE (pulmonary embolism) 01/03/2015   Pulmonary embolism ([place]) 01/03/2015   Acute blood loss anemia 12/24/2014   Clostridium difficile colitis 12/22/2014   Diverticulitis of large intestine without perforation or abscess with bleeding    Diverticulitis 12/21/2014   Melena 12/21/2014   Diarrhea 12/16/2014   Hypotension 12/16/2014   HX: anticoagulation 11/02/2013   Right leg DVT ([place])    Bilateral pulmonary embolism ([facility])    Ejection fraction    Anemia 03/01/2012   History of lower GI bleeding 11/01/2011   Gastroparesis 07/13/2011   Constipation 07/13/2011   Atrial fibrillation ([place])    Renal insufficiency    COPD (chronic obstructive pulmonary disease) ([place])  Posttraumatic stress disorder    GERD (gastroesophageal reflux disease)    Hypertension    Dyslipidemia    CAD (coronary artery disease)    Atrial flutter (H[facility])    Gout    Peripheral arterial disease (H[facility])    Chronic kidney disease    Atrial flutter ([place]) 06/20/2009   Peripheral vascular disease ([place]) 06/20/2009   Hyperlipidemia 05/09/2009   Essential hypertension 0000000   OTH COMPLICATIONS DUE INTERNAL JOINT PROSTHESIS 04/01/2009    DEGENERATIVE JOINT DISEASE, HIPS 09/25/2007   SHOULDER PAIN 09/25/2007   HIP PAIN 09/25/2007   BACK PAIN 09/25/2007   IMPINGEMENT SYNDROME 09/25/2007    Past Surgical History:  Procedure Laterality Date   BACK SURGERY     [place]   CATARACT EXTRACTION     COLON RESECTION     COLONOSCOPY N/A 03/13/2013   Procedure: COLONOSCOPY;  Surgeon: Rogene Houston, MD;  Location[facility];  Service: Endoscopy;  Laterality: N/A;  730   COLONOSCOPY N/A 12/22/2014   Procedure: COLONOSCOPY;  Surgeon: Rogene Houston, MD;  Location: [facility];  Service: Endoscopy;  Laterality: N/A;   CORONARY ANGIOPLASTY WITH STENT PLACEMENT     x 4   GIVENS CAPSULE STUDY N/A 12/24/2014   Procedure: GIVENS CAPSULE STUDY;  Surgeon: Rogene Houston, MD;  Location: [facility];  Service: Endoscopy;  Laterality: N/A;   KNEE ARTHROPLASTY     Right   PROSTATE SURGERY     TOTAL HIP ARTHROPLASTY     Right-complicated hx   TOTAL KNEE ARTHROPLASTY     Left        Home Medications    Prior to Admission medications   Medication Sig Start Date End Date Taking? Authorizing Provider  albuterol (PROVENTIL) (2.5 MG/3ML) 0.083% nebulizer solution Take 2.5 mg by nebulization every 6 (six) hours as needed for wheezing or shortness of breath.  01/11/17   [provider]  allopurinol (ZYLOPRIM) 100 MG tablet Take 100 mg by mouth every evening.     [provider]  ANORO ELLIPTA 62.5-25 MCG/INH AEPB Inhale 1 puff into the lungs daily. 04/19/16   [provider]  ascorbic acid (VITAMIN C) 500 MG tablet Take 500 mg by mouth daily.     [provider]  atorvastatin (LIPITOR) 80 MG tablet Take 80 mg by mouth every evening.     [provider]  benzonatate (TESSALON) 100 MG capsule Take 100 mg by mouth 3 (three) times daily as needed for cough.  01/06/17   [provider]  calcium-vitamin D (OSCAL WITH D) 500-200 MG-UNIT per tablet Take 1 tablet by mouth  daily.     [provider]  carbidopa-levodopa (SINEMET IR) 25-100 MG tablet Take 1 tablet by mouth 2 (two) times a day.    [provider]  COLCRYS 0.6 MG tablet Take 1 tablet by mouth daily as needed (gout pain).  07/07/15   [provider]  diclofenac sodium (VOLTAREN) 1 % GEL Apply 1 application topically daily. Applied to feet 05/20/15   [provider]  dicyclomine (BENTYL) 10 MG capsule Take 1 capsule (10 mg total) by mouth daily. 12/11/18   Setzer, Rona Ravens, NP  dicyclomine (BENTYL) 10 MG capsule Take 1 capsule (10 mg total) by mouth 2 (two) times daily as needed for up to 14 days for spasms. 04/19/19 05/03/19  Noralyn Pick, NP  digoxin (LANOXIN) 0.125 MG tablet Take 1 tablet (0.125 mg total) by mouth daily. 03/29/19  Barrett, Evelene Croon, PA-C  diltiazem (CARDIZEM CD) 120 MG 24 hr capsule Take 1 capsule (120 mg total) by mouth daily. As directed based off blood pressure readings 03/29/19   Barrett, Evelene Croon, PA-C  fenofibrate (TRICOR) 145 MG tablet Take 1 tablet (145 mg total) by mouth daily. Patient taking differently: Take 145 mg by mouth every evening.  08/16/11   de Stanford Scotland, MD  fentaNYL (DURAGESIC - DOSED MCG/HR) 25 MCG/HR patch Place 25 mcg onto the skin every 3 (three) days.    [provider]  ferrous sulfate 325 (65 FE) MG tablet Take 325 mg by mouth daily with breakfast.    [provider]  finasteride (PROSCAR) 5 MG tablet Take 5 mg by mouth daily.      [provider]  furosemide (LASIX) 40 MG tablet Take 40 mg by mouth every evening. *May take 80mg  as needed for swelling    [provider]  HYDROcodone-acetaminophen (NORCO) 10-325 MG tablet Take 1 tablet by mouth every 6 (six) hours as needed for moderate pain or severe pain.  01/21/17   [provider]  ipratropium-albuterol (DUONEB) 0.5-2.5 (3) MG/3ML SOLN Take 3 mLs by nebulization 3 (three) times daily. 01/20/17   Mannam, Hart Robinsons, MD    lidocaine (LIDODERM) 5 % Place 1 patch onto the skin daily as needed (for pain). Every 12 hours as needed 08/29/15   [provider]  lisinopril (PRINIVIL,ZESTRIL) 20 MG tablet Take 20 mg by mouth daily. Take one tablet by mouth if blood pressure is over 150    [provider]  magnesium oxide (MAG-OX) 400 (241.3 MG) MG tablet Take 1 tablet (400 mg total) by mouth 2 (two) times daily. 01/06/15   Reyne Dumas, MD  metoprolol tartrate (LOPRESSOR) 50 MG tablet Take 50 mg by mouth 2 (two) times daily.    [provider]  Multiple Vitamin (MULTIVITAMIN) tablet Take 1 tablet by mouth daily.      [provider]  nitroGLYCERIN (NITROSTAT) 0.4 MG SL tablet Place 1 tablet (0.4 mg total) under the tongue every 5 (five) minutes x 3 doses as needed. 01/07/15   Carlena Bjornstad, MD  Omega-3 Fatty Acids (FISH OIL) 1000 MG CAPS Take 2 capsules by mouth daily.     [provider]  pantoprazole (PROTONIX) 40 MG tablet Take 40 mg by mouth 2 (two) times daily.     [provider]  potassium chloride SA (K-DUR) 20 MEQ tablet Take 1 tablet (20 mEq total) by mouth daily. 03/29/19   Barrett, Evelene Croon, PA-C  PROAIR HFA 108 (90 Base) MCG/ACT inhaler Inhale 2 puffs into the lungs 2 (two) times daily as needed for wheezing or shortness of breath. 2 puffs twice daily 08/20/15   [provider]  rivaroxaban (XARELTO) 20 MG TABS tablet Take 1 tablet (20 mg total) by mouth daily. 01/27/15   Reyne Dumas, MD  rivastigmine (EXELON) 9.5 mg/24hr APPLY 1 PATCH DAILY. 03/21/17   Cameron Sprang, MD  tamsulosin (FLOMAX) 0.4 MG CAPS capsule Take 0.4 mg by mouth daily after supper.    [provider]    Family History Family History  Problem Relation Age of Onset   Diabetes Daughter    Hypertension Daughter    Obesity Daughter    Cancer Maternal Grandmother     Social History Social History   Tobacco Use   Smoking status: Former Smoker    Packs/day: 1.00     Years: 30.00  Pack years: 30.00    Types: Cigarettes    Start date: 08/31/1956    Quit date: 08/17/2003    Years since quitting: 15.8   Smokeless tobacco: Never Used   Tobacco comment: Patient smoked a pack a day for about 30 years  Substance Use Topics   Alcohol use: No    Alcohol/week: 0.0 standard drinks   Drug use: No    Types: Other-see comments     Allergies   Latex, Omeprazole, Other, Penicillins, Sulfonamide derivatives, and Iodine   Review of Systems Review of Systems  All other systems reviewed and are negative.    Physical Exam Updated Vital Signs BP 115/67    Pulse 67    Temp 98.7 F (37.1 C) (Oral)    Resp (!) 26    SpO2 94%   Physical Exam Vitals signs and nursing note reviewed.  Constitutional:      General: He is not in acute distress.    Appearance: He is well-developed.  HENT:     Head: Normocephalic and atraumatic.     Mouth/Throat:     Pharynx: No oropharyngeal exudate.  Eyes:     General: No scleral icterus.       Right eye: No discharge.        Left eye: No discharge.     Conjunctiva/sclera: Conjunctivae normal.     Pupils: Pupils are equal, round, and reactive to light.  Neck:     Musculoskeletal: Normal range of motion and neck supple.     Thyroid: No thyromegaly.     Vascular: No JVD.  Cardiovascular:     Rate and Rhythm: Normal rate and regular rhythm.     Heart sounds: Normal heart sounds. No murmur. No friction rub. No gallop.   Pulmonary:     Effort: Pulmonary effort is normal. No respiratory distress.     Breath sounds: Normal breath sounds. No wheezing or rales.  Abdominal:     General: Bowel sounds are normal. There is no distension.     Palpations: Abdomen is soft. There is no mass.     Tenderness: There is abdominal tenderness.     Comments: Tenderness located in the epigastrium and over the xiphoid and left lower costal margin, no rashes to the skin  Musculoskeletal: Normal range of motion.        General: No  tenderness.     Comments: The patient has muscle wasting and cachexia of his legs, able to move both of his arms somewhat  Lymphadenopathy:     Cervical: No cervical adenopathy.  Skin:    General: Skin is warm and dry.     Findings: No erythema or rash.  Neurological:     Mental Status: He is alert. Mental status is at baseline.     Coordination: Coordination normal.  Psychiatric:        Behavior: Behavior normal.      ED Treatments / Results  Labs (all labs ordered are listed, but only abnormal results are displayed) Labs Reviewed  CBC WITH DIFFERENTIAL/PLATELET - Abnormal; Notable for the following components:      Result Value   WBC 15.2 (*)    RBC 3.58 (*)    Hemoglobin 11.1 (*)    HCT 35.3 (*)    RDW 15.7 (*)    Neutro Abs 11.2 (*)    Monocytes Absolute 1.5 (*)    Abs Immature Granulocytes 0.09 (*)    All other components within normal limits  COMPREHENSIVE  METABOLIC PANEL - Abnormal; Notable for the following components:   Glucose, Bld 143 (*)    BUN 26 (*)    Total Protein 6.0 (*)    Albumin 2.6 (*)    All other components within normal limits  LIPASE, BLOOD  LACTIC ACID, PLASMA  TROPONIN I (HIGH SENSITIVITY)  TROPONIN I (HIGH SENSITIVITY)    EKG None  Radiology Ct Abdomen Pelvis W Contrast  Result Date: 06/04/2019 CLINICAL DATA:  Generalized abdominal pain with constipation for 2 days. History partial colon resection, atrial fibrillation and COPD. EXAM: CT ABDOMEN AND PELVIS WITH CONTRAST TECHNIQUE: Multidetector CT imaging of the abdomen and pelvis was performed using the standard protocol following bolus administration of intravenous contrast. CONTRAST:  1107mL OMNIPAQUE IOHEXOL 300 MG/ML  SOLN COMPARISON:  Abdominopelvic CT 02/14/2019. FINDINGS: Lower chest: New small bilateral pleural effusions with associated increased dependent atelectasis at both lung bases. There is underlying prominent extrapleural fat deposition posteriorly and a moderate size hiatal  hernia. A thin walled cavitary lesion in the right upper lobe seen on the prior chest CT is partially imaged and is now all partially opacified with fluid, measuring up to 2.7 cm on image 2/6. No other pulmonary nodules are identified. There is extensive atherosclerosis of the aorta and coronary arteries. There are possible coronary artery stents. Hepatobiliary: The liver is normal in density without suspicious focal abnormality. No evidence of gallstones, gallbladder wall thickening or biliary dilatation. Pancreas: Unremarkable. No pancreatic ductal dilatation or surrounding inflammatory changes. Spleen: Normal in size without focal abnormality. Adrenals/Urinary Tract: Both adrenal glands appear normal. There is an indeterminate lesion in the upper pole of the right kidney, measuring 15 mm on image 24/2. This measures higher than water density and is not a simple cyst. This is not as well seen on the previous noncontrast studies and may be slightly larger. In the interpolar region of the left kidney, there is a simple appearing cyst measuring 2.0 cm on image 40/98. No evidence of urinary tract calculus or hydronephrosis. The bladder is partly obscured by artifact from the hip prostheses, although otherwise appears unremarkable. Stomach/Bowel: No evidence of bowel wall thickening, distention or surrounding inflammatory change. There is a moderate size hiatal hernia. There is a large amount of stool throughout the colon. Postsurgical changes are present within the sigmoid colon. Vascular/Lymphatic: There are no enlarged abdominal or pelvic lymph nodes. Diffuse aortic and branch vessel atherosclerosis. Reproductive: The prostate gland and seminal vesicles are obscured by artifact from the bilateral total hip arthroplasties. Other: The left anterior abdominal wall is diffusely thinned and protuberant, similar to the previous study. No focal hernia identified in this area. Small fat and fluid containing left inguinal  hernia is stable. Musculoskeletal: No acute or significant osseous findings. Lumbar spondylosis noted post bilateral total hip arthroplasty. There is diffuse fatty muscular atrophy. IMPRESSION: 1. No acute findings or explanation for the patient's symptoms. There is a large amount of stool throughout the colon consistent with constipation. No evidence of bowel obstruction or perforation. 2. New small bilateral pleural effusions with increased dependent atelectasis at both lung bases. Previously identified thin walled cavitary lesion in the right upper lobe is now partially opacified with fluid, possibly due to hemorrhage or superinfection. Follow-up chest CT within the next 3 months recommended. 3. Indeterminate lesion in the upper pole of the right kidney, not as well seen on previous noncontrast studies. This could reflect a complex cyst or solid lesion. This could be further evaluated with dedicated renal CT  or MRI without and with contrast. 4. Aortic Atherosclerosis (ICD10-I70.0). Electronically Signed   By: Richardean Sale M.D.   On: 06/04/2019 13:43   Dg Chest Port 1 View  Result Date: 06/04/2019 CLINICAL DATA:  Cough. EXAM: PORTABLE CHEST 1 VIEW COMPARISON:  02/14/2019 FINDINGS: Stable enlarged cardiac silhouette. Increased bibasilar airspace opacity, significantly greater on the left compared to the right. Probable small bilateral pleural effusions. Unremarkable bones. IMPRESSION: 1. Increased bibasilar pneumonia, aspiration pneumonitis or pneumonia. 2. Probable small bilateral pleural effusions. 3. Stable cardiomegaly. Electronically Signed   By: Claudie Revering M.D.   On: 06/04/2019 12:56    Procedures Procedures (including critical care time)  Medications Ordered in ED Medications  aztreonam (AZACTAM) 2 g in sodium chloride 0.9 % 100 mL IVPB (has no administration in time range)  iohexol (OMNIPAQUE) 300 MG/ML solution 100 mL (100 mLs Intravenous Contrast Given 06/04/19 1310)     Initial  Impression / Assessment and Plan / ED Course  I have reviewed the triage vital signs and the nursing notes.  Pertinent labs & imaging results that were available during my care of the patient were reviewed by me and considered in my medical decision making (see chart for details).  Clinical Course as of Jun 04 1515  Mon Jun 04, 2019  1454 The patient's oxygen level is around 94%, he is tachypneic to 26, afebrile but with a white blood cell count of 15,000.  He is not hypotensive, I see no signs of renal dysfunction, liver dysfunction or cardiac dysfunction.  At this time the patient does have ongoing chest pain in the CT scan of the chest as well as the x-ray confirmed that he likely has some pneumonia, possibly aspiration but also a cavitary lesion that has filled with fluid which could very potentially be superinfection according to the radiology read.  Will admit to the hospital, antibiotics will be given, the patient does have a heavy stool burden which is unlikely someway contributing to the patient's abdominal discomfort.  No other intra-abdominal pathology or emergencies have been found.   [BM]    Clinical Course User Index [BM] Noemi Chapel, MD      The patient requires significant amount of care but is receiving this at home and has excellent care by his appearance today.  I am concerned about the increasing pain and thus we will get a troponin EKG chest x-ray lipase to look for other sources of the patient's pain.  D/W Dr. Denton Brick - who will see the pt for admission  Final Clinical Impressions(s) / ED Diagnoses   Final diagnoses:  Pneumonia due to infectious organism, unspecified laterality, unspecified part of lung  Left-sided chest pain  Constipation, unspecified constipation type    ED Discharge Orders    None       Noemi Chapel, MD 06/04/19 1516

## 2019-06-04 NOTE — ED Notes (Signed)
Patient transported to CT 

## 2019-06-05 ENCOUNTER — Observation Stay (HOSPITAL_COMMUNITY): Payer: Medicare Other

## 2019-06-05 DIAGNOSIS — R1013 Epigastric pain: Secondary | ICD-10-CM | POA: Diagnosis not present

## 2019-06-05 DIAGNOSIS — J168 Pneumonia due to other specified infectious organisms: Secondary | ICD-10-CM | POA: Diagnosis not present

## 2019-06-05 LAB — NOVEL CORONAVIRUS, NAA (HOSP ORDER, SEND-OUT TO REF LAB; TAT 18-24 HRS): SARS-CoV-2, NAA: NOT DETECTED

## 2019-06-05 LAB — CBC
HCT: 31.9 % — ABNORMAL LOW (ref 39.0–52.0)
Hemoglobin: 10 g/dL — ABNORMAL LOW (ref 13.0–17.0)
MCH: 30.9 pg (ref 26.0–34.0)
MCHC: 31.3 g/dL (ref 30.0–36.0)
MCV: 98.5 fL (ref 80.0–100.0)
Platelets: 130 10*3/uL — ABNORMAL LOW (ref 150–400)
RBC: 3.24 MIL/uL — ABNORMAL LOW (ref 4.22–5.81)
RDW: 15.8 % — ABNORMAL HIGH (ref 11.5–15.5)
WBC: 11 10*3/uL — ABNORMAL HIGH (ref 4.0–10.5)
nRBC: 0 % (ref 0.0–0.2)

## 2019-06-05 MED ORDER — SENNA 8.6 MG PO TABS: 1.0000 | ORAL_TABLET | Freq: Every day | ORAL | 3 refills | Status: AC

## 2019-06-05 MED ORDER — CLINDAMYCIN HCL 300 MG PO CAPS: 300.0000 mg | ORAL_CAPSULE | Freq: Three times a day (TID) | ORAL | 0 refills | Status: AC

## 2019-06-05 NOTE — Care Management Obs Status (Signed)
[place] NOTIFICATION   Patient Details  Name: Bryce Berry MRN: [id number] Date of Birth: 04/19/1941   Medicare Observation Status Notification Given:  Yes    [name] 06/05/2019, 2:41 PM

## 2019-06-05 NOTE — Progress Notes (Signed)
Notified by lab that patient is refusing lab work this am. Patient is disoriented x2. This nurse spoke with patient regarding importance of lab work. Patient also spoke to his wife, via telephone. Patient's wife updated. NT attempted to obtain vital signs. Patient swatted at NT and demanded to be left alone. Patient also refusing ECG this am. Patient educated on importance of obtaining vital signs and ECG.

## 2019-06-05 NOTE — Progress Notes (Signed)
Patient given Fleet enema at 2219. Patient tolerated well. Patient had a large, brown bowel movement approximately the size of a softball followed by liquid stool.

## 2019-06-05 NOTE — Progress Notes (Signed)
Patient refused to let me do EKG. RN aware

## 2019-06-05 NOTE — Discharge Summary (Signed)
Physician Discharge Summary  Bryce Berry [id number] DOB: 01/31/1941 DOA: 06/04/2019  PCP: Glenda Chroman, MD  Admit date: 06/04/2019  Discharge date: 06/05/2019  Admitted From:Home  Disposition:  Home  Recommendations for Outpatient Follow-up:  1. Follow up with PCP in 1-2 weeks 2. Repeat CT of the chest in 3 months to reevaluate cavitary lung lesion 3. Remain on clindamycin as prescribed for coverage of possible lung abscess 4. Remain on Senokot daily as prescribed for improved laxation 5. Continue other home medications as prior  Home Health: None  Equipment/Devices: None  Discharge Condition: Stable  CODE STATUS: Full  Diet recommendation: Heart Healthy  Brief/Interim Summary: Per HPI: Bryce Berry is a 78 y.o. male with medical history significant for hypertension, atrial flutter/fibrillation, peripheral vascular disease, COPD, PTSD, coronary artery disease status post PCI 2005, DVT and bilateral PE, mild dementia, wheelchair-bound. Patient was brought to the ED reports of abdominal pain and lower chest pain of 2 days duration.  Patient is awake and alert, can answer a few simple questions appropriately, spouse is at bedside and helps with some of the history.  Patient spouse states patient has not had a bowel movement in at least 2 days.  She tried enema and using her finger in his rectum to break up stool, but this did not help. Patient also reports lower abdominal pain that is burning in sensation, started while he was lying in bed, nonradiating.  He reports some difficulty breathing, but his chronic cough is unchanged and intermittently productive of whitish sputum. Spouse reports a temperature of 99 this morning, no pain with urination.  Patient is wheelchair-bound and unable to bear weight on his lower extremities for at least 5 years, from bilateral hip surgeries and knee surgeries.  Patient was admitted with lower chest and abdominal pain with imaging that  incidentally demonstrated a cavitary pulmonary lesion.  This was reviewed with pulmonology, Dr. Luan Pulling and subsequent chest CT was obtained with better imaging.  This needs to be followed in the outpatient setting with a repeat CT chest in 1 month.  Patient did receive IV vancomycin as well as aztreonam and Flagyl given penicillin allergy.  He will be discharged on clindamycin for 5 more days with recommended repeat CT chest in 3 months.  Patient was mostly seen to have a high colonic stool burden which did resolve after Fleet enema was given.  He had no further abdominal pain or chest pain otherwise noted after this.  He is otherwise stable for discharge and this was discussed with his wife at his bedside who had seen him and is now noted to be back to his usual baseline.  No other acute events noted throughout the course of this admission.  Discharge Diagnoses:  Active Problems:   Abdominal pain  Principal discharge diagnosis: Abdominal pain secondary to constipation along with cavitary pulmonary lesion.  Discharge Instructions  Discharge Instructions    Diet - low sodium heart healthy   Complete by: As directed    Increase activity slowly   Complete by: As directed      Allergies as of 06/05/2019      Reactions   Latex    Omeprazole Other (See Comments)   Reaction unknown-possible upset stomach   Other    Paper tape   Penicillins Other (See Comments)   Has patient had a PCN reaction causing immediate rash, facial/tongue/throat swelling, SOB or lightheadedness with hypotension: No Has patient had a PCN reaction causing severe rash involving  mucus membranes or skin necrosis: No Has patient had a PCN reaction that required hospitalization No Has patient had a PCN reaction occurring within the last 10 years: No If all of the above answers are "NO", then may proceed with Cephalosporin use. Rapid heart rate; flushed   Sulfonamide Derivatives Other (See Comments)   Rapid heart rate;  flushed   Iodine Rash      Medication List    TAKE these medications   allopurinol 100 MG tablet Commonly known as: ZYLOPRIM Take 100 mg by mouth every evening.   Amantadine HCl 100 MG tablet Take 1 tablet by mouth daily.   Anoro Ellipta 62.5-25 MCG/INH Aepb Generic drug: umeclidinium-vilanterol Inhale 1 puff into the lungs daily.   ascorbic acid 500 MG tablet Commonly known as: VITAMIN C Take 500 mg by mouth daily.   atorvastatin 80 MG tablet Commonly known as: LIPITOR Take 80 mg by mouth every evening.   benzonatate 100 MG capsule Commonly known as: TESSALON Take 100 mg by mouth 3 (three) times daily as needed for cough.   calcium-vitamin D 500-200 MG-UNIT tablet Commonly known as: OSCAL WITH D Take 1 tablet by mouth daily.   carbidopa-levodopa 25-100 MG tablet Commonly known as: SINEMET IR Take 1 tablet by mouth 2 (two) times a day.   clindamycin 300 MG capsule Commonly known as: CLEOCIN Take 1 capsule (300 mg total) by mouth 3 (three) times daily for 5 days.   Colcrys 0.6 MG tablet Generic drug: colchicine Take 1 tablet by mouth daily as needed (gout pain).   diclofenac sodium 1 % Gel Commonly known as: VOLTAREN Apply 1 application topically daily. Applied to feet   dicyclomine 10 MG capsule Commonly known as: BENTYL Take 1 capsule (10 mg total) by mouth daily.   dicyclomine 10 MG capsule Commonly known as: Bentyl Take 1 capsule (10 mg total) by mouth 2 (two) times daily as needed for up to 14 days for spasms.   digoxin 0.125 MG tablet Commonly known as: LANOXIN Take 1 tablet (0.125 mg total) by mouth daily.   diltiazem 120 MG 24 hr capsule Commonly known as: Cardizem CD Take 1 capsule (120 mg total) by mouth daily. As directed based off blood pressure readings   DULoxetine 30 MG capsule Commonly known as: CYMBALTA Take 1 capsule by mouth daily.   fenofibrate 145 MG tablet Commonly known as: TRICOR Take 1 tablet (145 mg total) by mouth  daily. What changed: when to take this   fentaNYL 25 MCG/HR Commonly known as: DURAGESIC Place 25 mcg onto the skin every 3 (three) days.   ferrous sulfate 325 (65 FE) MG tablet Take 325 mg by mouth daily with breakfast.   finasteride 5 MG tablet Commonly known as: PROSCAR Take 5 mg by mouth daily.   Fish Oil 1000 MG Caps Take 2 capsules by mouth daily.   furosemide 40 MG tablet Commonly known as: LASIX Take 40 mg by mouth every evening. *May take 80mg  as needed for swelling   hydrALAZINE 25 MG tablet Commonly known as: APRESOLINE Take 1 tablet by mouth 2 (two) times daily.   HYDROcodone-acetaminophen 10-325 MG tablet Commonly known as: NORCO Take 1 tablet by mouth every 6 (six) hours as needed for moderate pain or severe pain.   hydrOXYzine 25 MG capsule Commonly known as: VISTARIL Take 1 capsule by mouth 2 (two) times daily as needed.   ipratropium-albuterol 0.5-2.5 (3) MG/3ML Soln Commonly known as: DuoNeb Take 3 mLs by nebulization 3 (three)  times daily.   lidocaine 5 % Commonly known as: LIDODERM Place 1 patch onto the skin daily as needed (for pain). Every 12 hours as needed   lisinopril 20 MG tablet Commonly known as: ZESTRIL Take 20 mg by mouth daily. Take one tablet by mouth if blood pressure is over 150   magnesium oxide 400 (241.3 Mg) MG tablet Commonly known as: MAG-OX Take 1 tablet (400 mg total) by mouth 2 (two) times daily.   methocarbamol 500 MG tablet Commonly known as: ROBAXIN Take 1 tablet by mouth 4 (four) times daily as needed.   metoprolol tartrate 50 MG tablet Commonly known as: LOPRESSOR Take 50 mg by mouth 2 (two) times daily.   multivitamin tablet Take 1 tablet by mouth daily.   nitroGLYCERIN 0.4 MG SL tablet Commonly known as: NITROSTAT Place 1 tablet (0.4 mg total) under the tongue every 5 (five) minutes x 3 doses as needed.   pantoprazole 40 MG tablet Commonly known as: PROTONIX Take 40 mg by mouth 2 (two) times daily.    potassium chloride SA 20 MEQ tablet Commonly known as: KLOR-CON Take 1 tablet (20 mEq total) by mouth daily.   ProAir HFA 108 (90 Base) MCG/ACT inhaler Generic drug: albuterol Inhale 2 puffs into the lungs 2 (two) times daily as needed for wheezing or shortness of breath. 2 puffs twice daily   albuterol (2.5 MG/3ML) 0.083% nebulizer solution Commonly known as: PROVENTIL Take 2.5 mg by nebulization every 6 (six) hours as needed for wheezing or shortness of breath.   rivaroxaban 20 MG Tabs tablet Commonly known as: XARELTO Take 1 tablet (20 mg total) by mouth daily.   rivastigmine 9.5 mg/24hr Commonly known as: EXELON APPLY 1 PATCH DAILY.   senna 8.6 MG Tabs tablet Commonly known as: SENOKOT Take 1 tablet (8.6 mg total) by mouth daily.   tamsulosin 0.4 MG Caps capsule Commonly known as: FLOMAX Take 0.4 mg by mouth daily after supper.      Follow-up Information    Vyas, Dhruv B, MD Follow up in 1 week(s).   Specialty: Internal Medicine Contact information: [place] [address] [phone]        Satira Sark, MD .   Specialty: Cardiology Contact information: [place] [address] [phone]          Allergies  Allergen Reactions  . Latex   . Omeprazole Other (See Comments)    Reaction unknown-possible upset stomach  . Other     Paper tape  . Penicillins Other (See Comments)    Has patient had a PCN reaction causing immediate rash, facial/tongue/throat swelling, SOB or lightheadedness with hypotension: No Has patient had a PCN reaction causing severe rash involving mucus membranes or skin necrosis: No Has patient had a PCN reaction that required hospitalization No Has patient had a PCN reaction occurring within the last 10 years: No If all of the above answers are "NO", then may proceed with Cephalosporin use.    Rapid heart rate; flushed  . Sulfonamide Derivatives Other (See Comments)    Rapid heart rate;  flushed  . Iodine Rash    Consultations:  Dr. Luan Pulling curbside   Procedures/Studies: Ct Chest Wo Contrast  Result Date: 06/05/2019 CLINICAL DATA:  Cavitary right lung lesion on abdominal CT. Generalized abdominal pain. No given history of malignancy. EXAM: CT CHEST WITHOUT CONTRAST TECHNIQUE: Multidetector CT imaging of the chest was performed following the standard protocol without IV contrast. COMPARISON:  Abdominal CT 06/04/2019. Chest CT 02/14/2019. FINDINGS: Cardiovascular: There is extensive atherosclerosis of the aorta, great vessels and coronary arteries. No acute vascular findings on noncontrast imaging. The heart size is normal. There is no pericardial effusion. Mediastinum/Nodes: There are no enlarged mediastinal, hilar or axillary lymph nodes.A moderate size hiatal hernia is again noted. The trachea and thyroid gland appear unremarkable. Lungs/Pleura: Small bilateral pleural effusions are similar to the recent abdominal CT. There is also prominent extrapleural fat posteriorly. There is stable dependent atelectasis at both lung bases. The cavitary right upper lobe lesion is more fully imaged on the current study, measuring 2.6 x 1.9 cm on image 64/4. This compares with 2.4 x 2.1 cm on the chest CT of 3 months ago. In addition to slight interval enlargement, this lesion demonstrates thicker walls and dependent internal fluid. No other cavitary lesions or suspicious pulmonary nodules are identified. There is mild underlying centrilobular emphysema. Upper abdomen: The visualized upper abdomen appears stable without acute findings. Musculoskeletal/Chest wall: There is no chest wall mass or suspicious osseous finding. Mild thoracic spondylosis. Glenohumeral degenerative changes are present bilaterally with multiple loose bodies on the right. IMPRESSION: 1. The cavitary right upper lobe lesion is more fully imaged on the current study, and demonstrates thicker walls and dependent internal fluid.  The persistence and slight enlargement of this lesion compared with the prior chest CT of 02/14/2019 is suspicious for cavitary neoplasm. The fluid level could be secondary to superimposed infection or hemorrhage. 2. Stable small bilateral pleural effusions and bibasilar atelectasis from recent abdominal CT. 3. No adenopathy. 4. Moderate-sized hiatal hernia. 5. Aortic Atherosclerosis (ICD10-I70.0). Electronically Signed   By: Richardean Sale M.D.   On: 06/05/2019 11:51   Ct Abdomen Pelvis W Contrast  Result Date: 06/04/2019 CLINICAL DATA:  Generalized abdominal pain with constipation for 2 days. History partial colon resection, atrial fibrillation and COPD. EXAM: CT ABDOMEN AND PELVIS WITH CONTRAST TECHNIQUE: Multidetector CT imaging of the abdomen and pelvis was performed using the standard protocol following bolus administration of intravenous contrast. CONTRAST:  170mL OMNIPAQUE IOHEXOL 300 MG/ML  SOLN COMPARISON:  Abdominopelvic CT 02/14/2019. FINDINGS: Lower chest: New small bilateral pleural effusions with associated increased dependent atelectasis at both lung bases. There is underlying prominent extrapleural fat deposition posteriorly and a moderate size hiatal hernia. A thin walled cavitary lesion in the right upper lobe seen on the prior chest CT is partially imaged and is now all partially opacified with fluid, measuring up to 2.7 cm on image 2/6. No other pulmonary nodules are identified. There is extensive atherosclerosis of the aorta and coronary arteries. There are possible coronary artery stents. Hepatobiliary: The liver is normal in density without suspicious focal abnormality. No evidence of gallstones, gallbladder wall thickening or biliary dilatation. Pancreas: Unremarkable. No pancreatic ductal dilatation or surrounding inflammatory changes. Spleen: Normal in size without focal abnormality. Adrenals/Urinary Tract: Both adrenal glands appear normal. There is an indeterminate lesion in the  upper pole of the right kidney, measuring 15 mm on image 24/2. This measures higher than water density and is not a simple cyst. This is not as well seen on the previous noncontrast studies and may be slightly larger. In the interpolar region of the left kidney, there is a simple appearing cyst measuring 2.0 cm on image 40/98. No evidence of urinary tract calculus or hydronephrosis. The bladder is partly obscured by artifact from the hip prostheses, although otherwise appears unremarkable. Stomach/Bowel: No evidence of bowel wall thickening, distention or surrounding inflammatory change.  There is a moderate size hiatal hernia. There is a large amount of stool throughout the colon. Postsurgical changes are present within the sigmoid colon. Vascular/Lymphatic: There are no enlarged abdominal or pelvic lymph nodes. Diffuse aortic and branch vessel atherosclerosis. Reproductive: The prostate gland and seminal vesicles are obscured by artifact from the bilateral total hip arthroplasties. Other: The left anterior abdominal wall is diffusely thinned and protuberant, similar to the previous study. No focal hernia identified in this area. Small fat and fluid containing left inguinal hernia is stable. Musculoskeletal: No acute or significant osseous findings. Lumbar spondylosis noted post bilateral total hip arthroplasty. There is diffuse fatty muscular atrophy. IMPRESSION: 1. No acute findings or explanation for the patient's symptoms. There is a large amount of stool throughout the colon consistent with constipation. No evidence of bowel obstruction or perforation. 2. New small bilateral pleural effusions with increased dependent atelectasis at both lung bases. Previously identified thin walled cavitary lesion in the right upper lobe is now partially opacified with fluid, possibly due to hemorrhage or superinfection. Follow-up chest CT within the next 3 months recommended. 3. Indeterminate lesion in the upper pole of the  right kidney, not as well seen on previous noncontrast studies. This could reflect a complex cyst or solid lesion. This could be further evaluated with dedicated renal CT or MRI without and with contrast. 4. Aortic Atherosclerosis (ICD10-I70.0). Electronically Signed   By: Richardean Sale M.D.   On: 06/04/2019 13:43   Dg Chest Port 1 View  Result Date: 06/04/2019 CLINICAL DATA:  Cough. EXAM: PORTABLE CHEST 1 VIEW COMPARISON:  02/14/2019 FINDINGS: Stable enlarged cardiac silhouette. Increased bibasilar airspace opacity, significantly greater on the left compared to the right. Probable small bilateral pleural effusions. Unremarkable bones. IMPRESSION: 1. Increased bibasilar pneumonia, aspiration pneumonitis or pneumonia. 2. Probable small bilateral pleural effusions. 3. Stable cardiomegaly. Electronically Signed   By: Claudie Revering M.D.   On: 06/04/2019 12:56     Discharge Exam: Vitals:   06/05/19 0946 06/05/19 1425  BP: 132/71 (!) 150/81  Pulse:  90  Resp:  (!) 24  Temp:  99.9 F (37.7 C)  SpO2:  100%   Vitals:   06/04/19 1527 06/04/19 2108 06/05/19 0946 06/05/19 1425  BP: (!) 94/51 (!) 116/54 132/71 (!) 150/81  Pulse: 64 70  90  Resp: 18 18  (!) 24  Temp:  98.9 F (37.2 C)  99.9 F (37.7 C)  TempSrc:  Oral  Axillary  SpO2: 96% 99%  100%  Weight:      Height:        General: Pt is currently somnolent Cardiovascular: RRR, S1/S2 +, no rubs, no gallops Respiratory: CTA bilaterally, no wheezing, no rhonchi Abdominal: Soft, NT, ND, bowel sounds + Extremities: no edema, no cyanosis    The results of significant diagnostics from this hospitalization (including imaging, microbiology, ancillary and laboratory) are listed below for reference.     Microbiology: No results found for this or any previous visit (from the past 240 hour(s)).   Labs: BNP (last 3 results) No results for input(s): BNP in the last 8760 hours. Basic Metabolic Panel: Recent Labs  Lab 06/04/19 1151  NA  139  K 4.1  CL 104  CO2 27  GLUCOSE 143*  BUN 26*  CREATININE 0.61  CALCIUM 9.2   Liver Function Tests: Recent Labs  Lab 06/04/19 1151  AST 24  ALT 8  ALKPHOS 66  BILITOT 0.9  PROT 6.0*  ALBUMIN 2.6*   Recent Labs  Lab 06/04/19 1151  LIPASE 16   No results for input(s): AMMONIA in the last 168 hours. CBC: Recent Labs  Lab 06/04/19 1151 06/05/19 0851  WBC 15.2* 11.0*  NEUTROABS 11.2*  --   HGB 11.1* 10.0*  HCT 35.3* 31.9*  MCV 98.6 98.5  PLT 150 130*   Cardiac Enzymes: No results for input(s): CKTOTAL, CKMB, CKMBINDEX, TROPONINI in the last 168 hours. BNP: Invalid input(s): POCBNP CBG: No results for input(s): GLUCAP in the last 168 hours. D-Dimer No results for input(s): DDIMER in the last 72 hours. Hgb A1c No results for input(s): HGBA1C in the last 72 hours. Lipid Profile No results for input(s): CHOL, HDL, LDLCALC, TRIG, CHOLHDL, LDLDIRECT in the last 72 hours. Thyroid function studies No results for input(s): TSH, T4TOTAL, T3FREE, THYROIDAB in the last 72 hours.  Invalid input(s): FREET3 Anemia work up No results for input(s): VITAMINB12, FOLATE, FERRITIN, TIBC, IRON, RETICCTPCT in the last 72 hours. Urinalysis    Component Value Date/Time   COLORURINE AMBER (A) 02/10/2017 1811   APPEARANCEUR TURBID (A) 02/10/2017 1811   LABSPEC 1.016 02/10/2017 1811   PHURINE 5.0 02/10/2017 1811   GLUCOSEU NEGATIVE 02/10/2017 1811   HGBUR LARGE (A) 02/10/2017 1811   BILIRUBINUR NEGATIVE 02/10/2017 1811   KETONESUR NEGATIVE 02/10/2017 1811   PROTEINUR 100 (A) 02/10/2017 1811   UROBILINOGEN 0.2 12/21/2014 0848   NITRITE NEGATIVE 02/10/2017 1811   LEUKOCYTESUR MODERATE (A) 02/10/2017 1811   Sepsis Labs Invalid input(s): PROCALCITONIN,  WBC,  LACTICIDVEN Microbiology No results found for this or any previous visit (from the past 240 hour(s)).   Time coordinating discharge: 35 minutes  SIGNED:   Rodena Goldmann, DO [facility] 06/05/2019, 2:40  PM  If 7PM-7AM, please contact night-coverage [email or web address]

## 2019-06-30 ENCOUNTER — Encounter (HOSPITAL_COMMUNITY): Payer: Self-pay

## 2019-06-30 ENCOUNTER — Emergency Department (HOSPITAL_COMMUNITY): Payer: Medicare Other

## 2019-06-30 ENCOUNTER — Other Ambulatory Visit: Payer: Self-pay

## 2019-06-30 ENCOUNTER — Inpatient Hospital Stay (HOSPITAL_COMMUNITY)
Admission: EM | Admit: 2019-06-30 | Discharge: 2019-07-17 | DRG: 871 | Disposition: E | Payer: Medicare Other | Attending: Family Medicine | Admitting: Family Medicine

## 2019-06-30 DIAGNOSIS — R140 Abdominal distension (gaseous): Secondary | ICD-10-CM

## 2019-06-30 DIAGNOSIS — I129 Hypertensive chronic kidney disease with stage 1 through stage 4 chronic kidney disease, or unspecified chronic kidney disease: Secondary | ICD-10-CM | POA: Diagnosis present

## 2019-06-30 DIAGNOSIS — N1832 Chronic kidney disease, stage 3b: Secondary | ICD-10-CM | POA: Diagnosis present

## 2019-06-30 DIAGNOSIS — N400 Enlarged prostate without lower urinary tract symptoms: Secondary | ICD-10-CM | POA: Diagnosis present

## 2019-06-30 DIAGNOSIS — K219 Gastro-esophageal reflux disease without esophagitis: Secondary | ICD-10-CM | POA: Diagnosis present

## 2019-06-30 DIAGNOSIS — I2510 Atherosclerotic heart disease of native coronary artery without angina pectoris: Secondary | ICD-10-CM | POA: Diagnosis present

## 2019-06-30 DIAGNOSIS — Z96653 Presence of artificial knee joint, bilateral: Secondary | ICD-10-CM | POA: Diagnosis present

## 2019-06-30 DIAGNOSIS — Z66 Do not resuscitate: Secondary | ICD-10-CM | POA: Diagnosis not present

## 2019-06-30 DIAGNOSIS — Z515 Encounter for palliative care: Secondary | ICD-10-CM | POA: Diagnosis not present

## 2019-06-30 DIAGNOSIS — A419 Sepsis, unspecified organism: Principal | ICD-10-CM | POA: Diagnosis present

## 2019-06-30 DIAGNOSIS — I10 Essential (primary) hypertension: Secondary | ICD-10-CM | POA: Diagnosis present

## 2019-06-30 DIAGNOSIS — R6521 Severe sepsis with septic shock: Secondary | ICD-10-CM | POA: Diagnosis present

## 2019-06-30 DIAGNOSIS — I468 Cardiac arrest due to other underlying condition: Secondary | ICD-10-CM | POA: Diagnosis not present

## 2019-06-30 DIAGNOSIS — N179 Acute kidney failure, unspecified: Secondary | ICD-10-CM | POA: Diagnosis present

## 2019-06-30 DIAGNOSIS — Z86711 Personal history of pulmonary embolism: Secondary | ICD-10-CM | POA: Diagnosis not present

## 2019-06-30 DIAGNOSIS — Z7951 Long term (current) use of inhaled steroids: Secondary | ICD-10-CM

## 2019-06-30 DIAGNOSIS — R0689 Other abnormalities of breathing: Secondary | ICD-10-CM

## 2019-06-30 DIAGNOSIS — J449 Chronic obstructive pulmonary disease, unspecified: Secondary | ICD-10-CM | POA: Diagnosis not present

## 2019-06-30 DIAGNOSIS — Y95 Nosocomial condition: Secondary | ICD-10-CM | POA: Diagnosis present

## 2019-06-30 DIAGNOSIS — Z20828 Contact with and (suspected) exposure to other viral communicable diseases: Secondary | ICD-10-CM | POA: Diagnosis present

## 2019-06-30 DIAGNOSIS — E785 Hyperlipidemia, unspecified: Secondary | ICD-10-CM | POA: Diagnosis present

## 2019-06-30 DIAGNOSIS — R6520 Severe sepsis without septic shock: Secondary | ICD-10-CM | POA: Diagnosis not present

## 2019-06-30 DIAGNOSIS — Z882 Allergy status to sulfonamides status: Secondary | ICD-10-CM

## 2019-06-30 DIAGNOSIS — Z79899 Other long term (current) drug therapy: Secondary | ICD-10-CM

## 2019-06-30 DIAGNOSIS — K589 Irritable bowel syndrome without diarrhea: Secondary | ICD-10-CM | POA: Diagnosis present

## 2019-06-30 DIAGNOSIS — R55 Syncope and collapse: Secondary | ICD-10-CM

## 2019-06-30 DIAGNOSIS — I313 Pericardial effusion (noninflammatory): Secondary | ICD-10-CM | POA: Diagnosis present

## 2019-06-30 DIAGNOSIS — J440 Chronic obstructive pulmonary disease with acute lower respiratory infection: Secondary | ICD-10-CM | POA: Diagnosis present

## 2019-06-30 DIAGNOSIS — J189 Pneumonia, unspecified organism: Secondary | ICD-10-CM | POA: Diagnosis present

## 2019-06-30 DIAGNOSIS — R739 Hyperglycemia, unspecified: Secondary | ICD-10-CM | POA: Diagnosis present

## 2019-06-30 DIAGNOSIS — M810 Age-related osteoporosis without current pathological fracture: Secondary | ICD-10-CM | POA: Diagnosis present

## 2019-06-30 DIAGNOSIS — K819 Cholecystitis, unspecified: Secondary | ICD-10-CM | POA: Diagnosis not present

## 2019-06-30 DIAGNOSIS — K5641 Fecal impaction: Secondary | ICD-10-CM | POA: Diagnosis present

## 2019-06-30 DIAGNOSIS — R1011 Right upper quadrant pain: Secondary | ICD-10-CM

## 2019-06-30 DIAGNOSIS — I480 Paroxysmal atrial fibrillation: Secondary | ICD-10-CM | POA: Diagnosis present

## 2019-06-30 DIAGNOSIS — R001 Bradycardia, unspecified: Secondary | ICD-10-CM | POA: Diagnosis present

## 2019-06-30 DIAGNOSIS — F329 Major depressive disorder, single episode, unspecified: Secondary | ICD-10-CM | POA: Diagnosis present

## 2019-06-30 DIAGNOSIS — R791 Abnormal coagulation profile: Secondary | ICD-10-CM | POA: Diagnosis not present

## 2019-06-30 DIAGNOSIS — M109 Gout, unspecified: Secondary | ICD-10-CM | POA: Diagnosis present

## 2019-06-30 DIAGNOSIS — Z955 Presence of coronary angioplasty implant and graft: Secondary | ICD-10-CM

## 2019-06-30 DIAGNOSIS — I4891 Unspecified atrial fibrillation: Secondary | ICD-10-CM | POA: Diagnosis present

## 2019-06-30 DIAGNOSIS — Z91040 Latex allergy status: Secondary | ICD-10-CM

## 2019-06-30 DIAGNOSIS — Z86718 Personal history of other venous thrombosis and embolism: Secondary | ICD-10-CM

## 2019-06-30 DIAGNOSIS — Z8249 Family history of ischemic heart disease and other diseases of the circulatory system: Secondary | ICD-10-CM

## 2019-06-30 DIAGNOSIS — Z888 Allergy status to other drugs, medicaments and biological substances status: Secondary | ICD-10-CM

## 2019-06-30 DIAGNOSIS — K810 Acute cholecystitis: Secondary | ICD-10-CM | POA: Diagnosis present

## 2019-06-30 DIAGNOSIS — Z87440 Personal history of urinary (tract) infections: Secondary | ICD-10-CM

## 2019-06-30 DIAGNOSIS — Z880 Allergy status to penicillin: Secondary | ICD-10-CM

## 2019-06-30 DIAGNOSIS — Z833 Family history of diabetes mellitus: Secondary | ICD-10-CM

## 2019-06-30 DIAGNOSIS — D649 Anemia, unspecified: Secondary | ICD-10-CM | POA: Diagnosis present

## 2019-06-30 DIAGNOSIS — J989 Respiratory disorder, unspecified: Secondary | ICD-10-CM

## 2019-06-30 DIAGNOSIS — G8929 Other chronic pain: Secondary | ICD-10-CM | POA: Diagnosis present

## 2019-06-30 DIAGNOSIS — I4892 Unspecified atrial flutter: Secondary | ICD-10-CM | POA: Diagnosis present

## 2019-06-30 DIAGNOSIS — E860 Dehydration: Secondary | ICD-10-CM | POA: Diagnosis present

## 2019-06-30 DIAGNOSIS — R680 Hypothermia, not associated with low environmental temperature: Secondary | ICD-10-CM | POA: Diagnosis present

## 2019-06-30 DIAGNOSIS — Z7901 Long term (current) use of anticoagulants: Secondary | ICD-10-CM

## 2019-06-30 DIAGNOSIS — I959 Hypotension, unspecified: Secondary | ICD-10-CM

## 2019-06-30 DIAGNOSIS — R069 Unspecified abnormalities of breathing: Secondary | ICD-10-CM

## 2019-06-30 DIAGNOSIS — Z96641 Presence of right artificial hip joint: Secondary | ICD-10-CM | POA: Diagnosis present

## 2019-06-30 DIAGNOSIS — Z452 Encounter for adjustment and management of vascular access device: Secondary | ICD-10-CM

## 2019-06-30 DIAGNOSIS — Z87891 Personal history of nicotine dependence: Secondary | ICD-10-CM

## 2019-06-30 DIAGNOSIS — I739 Peripheral vascular disease, unspecified: Secondary | ICD-10-CM | POA: Diagnosis present

## 2019-06-30 DIAGNOSIS — I459 Conduction disorder, unspecified: Secondary | ICD-10-CM | POA: Diagnosis present

## 2019-06-30 LAB — COMPREHENSIVE METABOLIC PANEL
ALT: 6 U/L (ref 0–44)
AST: 13 U/L — ABNORMAL LOW (ref 15–41)
Albumin: 2.4 g/dL — ABNORMAL LOW (ref 3.5–5.0)
Alkaline Phosphatase: 46 U/L (ref 38–126)
Anion gap: 6 (ref 5–15)
BUN: 105 mg/dL — ABNORMAL HIGH (ref 8–23)
CO2: 17 mmol/L — ABNORMAL LOW (ref 22–32)
Calcium: 7.8 mg/dL — ABNORMAL LOW (ref 8.9–10.3)
Chloride: 111 mmol/L (ref 98–111)
Creatinine, Ser: 1.68 mg/dL — ABNORMAL HIGH (ref 0.61–1.24)
GFR calc Af Amer: 45 mL/min — ABNORMAL LOW (ref >60)
GFR calc non Af Amer: 39 mL/min — ABNORMAL LOW (ref >60)
Glucose, Bld: 181 mg/dL — ABNORMAL HIGH (ref 70–99)
Potassium: 5 mmol/L (ref 3.5–5.1)
Sodium: 134 mmol/L — ABNORMAL LOW (ref 135–145)
Total Bilirubin: 0.7 mg/dL (ref 0.3–1.2)
Total Protein: 6 g/dL — ABNORMAL LOW (ref 6.5–8.1)

## 2019-06-30 LAB — LACTIC ACID, PLASMA
Lactic Acid, Venous: 1.9 mmol/L (ref 0.5–1.9)
Lactic Acid, Venous: 1.2 mmol/L (ref 0.5–1.9)

## 2019-06-30 LAB — URINALYSIS, ROUTINE W REFLEX MICROSCOPIC
Bacteria, UA: NONE SEEN
Bilirubin Urine: NEGATIVE
Glucose, UA: NEGATIVE mg/dL
Ketones, ur: NEGATIVE mg/dL
Nitrite: NEGATIVE
Protein, ur: NEGATIVE mg/dL
Specific Gravity, Urine: 1.016 (ref 1.005–1.030)
pH: 5 (ref 5.0–8.0)

## 2019-06-30 LAB — PROTIME-INR
INR: 3.6 — ABNORMAL HIGH (ref 0.8–1.2)
Prothrombin Time: 35.4 seconds — ABNORMAL HIGH (ref 11.4–15.2)

## 2019-06-30 LAB — CBC WITH DIFFERENTIAL/PLATELET
Abs Immature Granulocytes: 0.63 10*3/uL — ABNORMAL HIGH (ref 0.00–0.07)
Basophils Absolute: 0 10*3/uL (ref 0.0–0.1)
Basophils Relative: 0 %
Eosinophils Absolute: 0 10*3/uL (ref 0.0–0.5)
Eosinophils Relative: 0 %
HCT: 34.2 % — ABNORMAL LOW (ref 39.0–52.0)
Hemoglobin: 10.5 g/dL — ABNORMAL LOW (ref 13.0–17.0)
Immature Granulocytes: 3 %
Lymphocytes Relative: 10 %
Lymphs Abs: 2.1 10*3/uL (ref 0.7–4.0)
MCH: 30 pg (ref 26.0–34.0)
MCHC: 30.7 g/dL (ref 30.0–36.0)
MCV: 97.7 fL (ref 80.0–100.0)
Monocytes Absolute: 1.3 10*3/uL — ABNORMAL HIGH (ref 0.1–1.0)
Monocytes Relative: 6 %
Neutro Abs: 15.8 10*3/uL — ABNORMAL HIGH (ref 1.7–7.7)
Neutrophils Relative %: 81 %
Platelets: 262 10*3/uL (ref 150–400)
RBC: 3.5 MIL/uL — ABNORMAL LOW (ref 4.22–5.81)
RDW: 16.1 % — ABNORMAL HIGH (ref 11.5–15.5)
WBC: 19.9 10*3/uL — ABNORMAL HIGH (ref 4.0–10.5)
nRBC: 0.1 % (ref 0.0–0.2)

## 2019-06-30 LAB — PROCALCITONIN: Procalcitonin: 0.19 ng/mL

## 2019-06-30 LAB — TROPONIN I (HIGH SENSITIVITY)
Troponin I (High Sensitivity): 19 ng/L — ABNORMAL HIGH (ref <18)
Troponin I (High Sensitivity): 20 ng/L — ABNORMAL HIGH (ref <18)

## 2019-06-30 LAB — CBG MONITORING, ED: Glucose-Capillary: 192 mg/dL — ABNORMAL HIGH (ref 70–99)

## 2019-06-30 LAB — APTT: aPTT: 44 seconds — ABNORMAL HIGH (ref 24–36)

## 2019-06-30 LAB — DIGOXIN LEVEL: Digoxin Level: <0.2 ng/mL — ABNORMAL LOW (ref 0.8–2.0)

## 2019-06-30 MED ORDER — ATROPINE SULFATE 1 MG/10ML IJ SOSY
1.0000 mg | PREFILLED_SYRINGE | Freq: Once | INTRAMUSCULAR | Status: AC
  Administered 2019-06-30: 1 mg via INTRAVENOUS

## 2019-06-30 MED ORDER — FENOFIBRATE 160 MG PO TABS
160.0000 mg | ORAL_TABLET | Freq: Every day | ORAL | Status: DC
  Filled 2019-06-30 (×3): qty 1

## 2019-06-30 MED ORDER — DULOXETINE HCL 30 MG PO CPEP
30.0000 mg | ORAL_CAPSULE | Freq: Every day | ORAL | Status: DC
  Administered 2019-07-01: 30 mg via ORAL
  Filled 2019-06-30: qty 1

## 2019-06-30 MED ORDER — HYDROXYZINE HCL 25 MG PO TABS
25.0000 mg | ORAL_TABLET | Freq: Three times a day (TID) | ORAL | Status: DC | PRN
  Filled 2019-06-30: qty 3

## 2019-06-30 MED ORDER — ALBUTEROL SULFATE HFA 108 (90 BASE) MCG/ACT IN AERS
2.0000 | INHALATION_SPRAY | Freq: Two times a day (BID) | RESPIRATORY_TRACT | Status: DC | PRN
  Filled 2019-06-30: qty 6.7

## 2019-06-30 MED ORDER — RIVAROXABAN 20 MG PO TABS: 20.0000 mg | ORAL_TABLET | Freq: Every day | ORAL | Status: DC

## 2019-06-30 MED ORDER — SODIUM CHLORIDE 0.9 % IV BOLUS
1000.0000 mL | Freq: Once | INTRAVENOUS | Status: AC
  Administered 2019-06-30: 1000 mL via INTRAVENOUS

## 2019-06-30 MED ORDER — SODIUM CHLORIDE 0.9 % IV SOLN
500.0000 mg | INTRAVENOUS | Status: DC
  Administered 2019-07-01 (×2): 500 mg via INTRAVENOUS
  Filled 2019-06-30 (×2): qty 500

## 2019-06-30 MED ORDER — SENNA 8.6 MG PO TABS
1.0000 | ORAL_TABLET | Freq: Every day | ORAL | Status: DC
  Administered 2019-07-01: 8.6 mg via ORAL
  Filled 2019-06-30 (×4): qty 1

## 2019-06-30 MED ORDER — SODIUM CHLORIDE 0.9 % IV SOLN
INTRAVENOUS | Status: DC
  Administered 2019-06-30: 23:00:00 via INTRAVENOUS

## 2019-06-30 MED ORDER — LORAZEPAM 0.5 MG PO TABS: 0.5000 mg | ORAL_TABLET | Freq: Every day | ORAL | Status: DC | PRN

## 2019-06-30 MED ORDER — AMANTADINE HCL 100 MG PO CAPS
100.0000 mg | ORAL_CAPSULE | Freq: Every day | ORAL | Status: DC
  Administered 2019-07-01: 100 mg via ORAL
  Filled 2019-06-30 (×4): qty 1

## 2019-06-30 MED ORDER — BENZONATATE 100 MG PO CAPS: 100.0000 mg | ORAL_CAPSULE | Freq: Three times a day (TID) | ORAL | Status: DC | PRN

## 2019-06-30 MED ORDER — SODIUM CHLORIDE 0.9 % IV SOLN
2.0000 g | Freq: Once | INTRAVENOUS | Status: AC
  Administered 2019-06-30: 2 g via INTRAVENOUS
  Filled 2019-06-30: qty 2

## 2019-06-30 MED ORDER — INSULIN ASPART 100 UNIT/ML ~~LOC~~ SOLN
0.0000 [IU] | Freq: Three times a day (TID) | SUBCUTANEOUS | Status: DC
  Administered 2019-07-01 (×3): 3 [IU] via SUBCUTANEOUS
  Administered 2019-07-02: 2 [IU] via SUBCUTANEOUS

## 2019-06-30 MED ORDER — ATROPINE SULFATE 1 MG/ML IJ SOLN: 1.0000 mg | Freq: Once | INTRAMUSCULAR | Status: DC

## 2019-06-30 MED ORDER — METHOCARBAMOL 500 MG PO TABS: 500.0000 mg | ORAL_TABLET | Freq: Four times a day (QID) | ORAL | Status: DC | PRN

## 2019-06-30 MED ORDER — TAMSULOSIN HCL 0.4 MG PO CAPS: 0.4000 mg | ORAL_CAPSULE | Freq: Every day | ORAL | Status: DC

## 2019-06-30 MED ORDER — DILTIAZEM HCL ER COATED BEADS 120 MG PO CP24: 120.0000 mg | ORAL_CAPSULE | Freq: Every day | ORAL | Status: DC

## 2019-06-30 MED ORDER — FINASTERIDE 5 MG PO TABS
5.0000 mg | ORAL_TABLET | Freq: Every day | ORAL | Status: DC
  Filled 2019-06-30 (×3): qty 1

## 2019-06-30 MED ORDER — VANCOMYCIN HCL 10 G IV SOLR
1250.0000 mg | INTRAVENOUS | Status: DC
  Filled 2019-06-30: qty 1250

## 2019-06-30 MED ORDER — INFLUENZA VAC A&B SA ADJ QUAD 0.5 ML IM PRSY: 0.5000 mL | PREFILLED_SYRINGE | INTRAMUSCULAR | Status: DC

## 2019-06-30 MED ORDER — HYDROCODONE-ACETAMINOPHEN 10-325 MG PO TABS
1.0000 | ORAL_TABLET | Freq: Four times a day (QID) | ORAL | Status: DC | PRN
  Administered 2019-06-30 – 2019-07-01 (×3): 1 via ORAL
  Filled 2019-06-30 (×3): qty 1

## 2019-06-30 MED ORDER — CARBIDOPA-LEVODOPA 25-100 MG PO TABS
1.0000 | ORAL_TABLET | Freq: Two times a day (BID) | ORAL | Status: DC
  Administered 2019-07-01 (×2): 1 via ORAL
  Filled 2019-06-30 (×7): qty 1

## 2019-06-30 MED ORDER — SODIUM CHLORIDE 0.9 % IV SOLN
2.0000 g | Freq: Two times a day (BID) | INTRAVENOUS | Status: DC
  Administered 2019-07-01 (×2): 2 g via INTRAVENOUS
  Filled 2019-06-30 (×2): qty 2

## 2019-06-30 MED ORDER — DIGOXIN 125 MCG PO TABS
0.1250 mg | ORAL_TABLET | Freq: Every day | ORAL | Status: DC
  Filled 2019-06-30 (×3): qty 1

## 2019-06-30 MED ORDER — VANCOMYCIN HCL IN DEXTROSE 1-5 GM/200ML-% IV SOLN
1000.0000 mg | INTRAVENOUS | Status: DC
  Administered 2019-06-30: 1000 mg via INTRAVENOUS
  Filled 2019-06-30 (×2): qty 200

## 2019-06-30 MED ORDER — PANTOPRAZOLE SODIUM 40 MG PO TBEC
40.0000 mg | DELAYED_RELEASE_TABLET | Freq: Two times a day (BID) | ORAL | Status: DC
  Administered 2019-07-01 (×2): 40 mg via ORAL
  Filled 2019-06-30 (×2): qty 1

## 2019-06-30 MED ORDER — LIDOCAINE 5 % EX PTCH
1.0000 | MEDICATED_PATCH | Freq: Two times a day (BID) | CUTANEOUS | Status: DC | PRN
  Filled 2019-06-30: qty 1

## 2019-06-30 MED ORDER — DICYCLOMINE HCL 10 MG PO CAPS
10.0000 mg | ORAL_CAPSULE | Freq: Two times a day (BID) | ORAL | Status: DC | PRN
  Administered 2019-07-01: 10 mg via ORAL
  Filled 2019-06-30: qty 1

## 2019-06-30 MED ORDER — CHLORHEXIDINE GLUCONATE CLOTH 2 % EX PADS
6.0000 | MEDICATED_PAD | Freq: Every day | CUTANEOUS | Status: DC
  Administered 2019-07-01: 6 via TOPICAL

## 2019-06-30 MED ORDER — UMECLIDINIUM-VILANTEROL 62.5-25 MCG/INH IN AEPB
1.0000 | INHALATION_SPRAY | Freq: Every day | RESPIRATORY_TRACT | Status: DC
  Filled 2019-06-30: qty 14

## 2019-06-30 MED ORDER — FENTANYL 25 MCG/HR TD PT72: 1.0000 | MEDICATED_PATCH | TRANSDERMAL | Status: DC

## 2019-06-30 MED ORDER — ATORVASTATIN CALCIUM 40 MG PO TABS
80.0000 mg | ORAL_TABLET | Freq: Every evening | ORAL | Status: DC
  Administered 2019-07-01: 80 mg via ORAL
  Filled 2019-06-30: qty 2

## 2019-06-30 MED ORDER — METRONIDAZOLE IN NACL 5-0.79 MG/ML-% IV SOLN
500.0000 mg | Freq: Once | INTRAVENOUS | Status: AC
  Administered 2019-06-30: 19:00:00 500 mg via INTRAVENOUS
  Filled 2019-06-30: qty 100

## 2019-06-30 MED ORDER — INSULIN ASPART 100 UNIT/ML ~~LOC~~ SOLN: 0.0000 [IU] | Freq: Every day | SUBCUTANEOUS | Status: DC

## 2019-06-30 MED ORDER — RIVAROXABAN 20 MG PO TABS
20.0000 mg | ORAL_TABLET | Freq: Every day | ORAL | Status: DC
  Administered 2019-07-01: 20 mg via ORAL
  Filled 2019-06-30: qty 1

## 2019-06-30 MED ORDER — RIVASTIGMINE 9.5 MG/24HR TD PT24: 9.5000 mg | MEDICATED_PATCH | Freq: Every day | TRANSDERMAL | Status: DC

## 2019-06-30 NOTE — ED Provider Notes (Signed)
[facility] EMERGENCY DEPARTMENT Provider Note   CSN: [id number] Arrival date & time: 07/15/2019  1806     History   Chief Complaint Chief Complaint  Patient presents with  . Weakness    HPI Bryce Berry is a 78 y.o. male with a history of afib, prior VTE, chronic anticoagulation on Xarelto, CAD, COPD, hyperlipidemia, prior GI bleed, and hypertension who presents to the emergency department via EMS for generalized weakness that began shortly prior to arrival.  Patient states he was eating dinner when he all of a sudden began to feel very generally weak and lightheaded as if he may pass out.  No syncopal episode occurred.  He states he still does not feel quite right.  Per family they noted that he seemed a bit out of it and was very clammy/sweaty, he was verbally responsive to them, they called 911.  Upon ER arrival they state he is mentally fairly back to baseline. He did mention some chest/epigastric pain earlier in the night but does not complaining of this now- he was seen in the ER for this pain recently.  He has had a cough for this is not necessarily atypical for him.  He denies fever, chills, headache, visual disturbance, vomiting, diarrhea, or passing out.  He has been taking his anticoagulation as prescribed. His previous metoprolol was changed to carvedilol recently secondary to bradycardia per his wife.   HPI  Past Medical History:  Diagnosis Date  . Atrial fibrillation ([place])   . Atrial flutter ([place])    Possible history in the past - details unclear  . Bilateral pulmonary embolism ([place])    a. 10/2012 - V:Q scan high prob for Bilat PE in setting of dyspnea/hemoptysis-->anticoagulation started.  Marland Kitchen BPH (benign prostatic hyperplasia)   . CAD (coronary artery disease)    Stent PCI in [place] and also [place] in the past - details not clear  . Chronic anticoagulation   . Chronic pain   . 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 COPD (chronic obstructive pulmonary  disease) ([place])   . Depression   . Dyslipidemia   . Essential hypertension   . GERD (gastroesophageal reflux disease)   . GI bleed   . Gout   . Hyperlipidemia    Mixed  . Irritable bowel   . Osteoarthritis   . Osteoporosis   . Peripheral arterial disease ([place])    Dopplers Dr.Vyas office October, 2010, apparent occlusion anterior tibial arteries bilaterally., findings suggest hemodynamically significant stenosis of the proximal right provisional femoral artery and the mid left superficial artery.  Ankle-brachial indices indicate moderate stenosis within both lower extremities  . Posttraumatic stress disorder   . Recurrent UTI (urinary tract infection)   . Right leg DVT ([place])    a. 10/2012 - subocclusive DVT R popliteal vein.  . Wheelchair bound     Patient Active Problem List   Diagnosis Date Noted  . Abdominal pain 06/04/2019  . Syncope 02/10/2017  . Mild dementia ([place]) 09/01/2015  . Generalized weakness 07/30/2015  . Acute encephalopathy 07/29/2015  . UTI (lower urinary tract infection) 07/29/2015  . PE (pulmonary embolism) 01/03/2015  . Pulmonary embolism ([place]) 01/03/2015  . Acute blood loss anemia 12/24/2014  . Clostridium difficile colitis 12/22/2014  . Diverticulitis of large intestine without perforation or abscess with bleeding   . Diverticulitis 12/21/2014  . Melena 12/21/2014  . Diarrhea 12/16/2014  . Hypotension 12/16/2014  . HX: anticoagulation 11/02/2013  . Right leg DVT ([place])   .  Bilateral pulmonary embolism ([place])   . Ejection fraction   . Anemia 03/01/2012  . History of lower GI bleeding 11/01/2011  . Gastroparesis 07/13/2011  . Constipation 07/13/2011  . Atrial fibrillation ([place])   . Renal insufficiency   . COPD (chronic obstructive pulmonary disease) ([place])   . Posttraumatic stress disorder   . GERD (gastroesophageal reflux disease)   . Hypertension   . Dyslipidemia   . CAD (coronary artery disease)   . Atrial flutter ([place])   . Gout   . Peripheral  arterial disease ([place])   . Chronic kidney disease   . Atrial flutter ([place]) 06/20/2009  . Peripheral vascular disease ([place]) 06/20/2009  . Hyperlipidemia 05/09/2009  . Essential hypertension 05/09/2009  . OTH COMPLICATIONS DUE INTERNAL JOINT PROSTHESIS 04/01/2009  . 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 SHOULDER PAIN 09/25/2007  . HIP PAIN 09/25/2007  . BACK PAIN 09/25/2007  . IMPINGEMENT SYNDROME 09/25/2007    Past Surgical History:  Procedure Laterality Date  . [place]  . CATARACT EXTRACTION    . COLON RESECTION    . COLONOSCOPY N/A 03/13/2013   Procedure: COLONOSCOPY;  Surgeon: Rogene Houston, MD;  Location: [facility] ENDO SUITE;  Service: Endoscopy;  Laterality: N/A;  730  . COLONOSCOPY N/A 12/22/2014   Procedure: COLONOSCOPY;  Surgeon: Rogene Houston, MD;  Location: [facility] ENDO SUITE;  Service: Endoscopy;  Laterality: N/A;  . CORONARY ANGIOPLASTY WITH STENT PLACEMENT     x 4  . GIVENS CAPSULE STUDY N/A 12/24/2014   Procedure: GIVENS CAPSULE STUDY;  Surgeon: Rogene Houston, MD;  Location: [facility] ENDO SUITE;  Service: Endoscopy;  Laterality: N/A;  . KNEE ARTHROPLASTY     Right  . PROSTATE SURGERY    . TOTAL HIP ARTHROPLASTY     Right-complicated hx  . TOTAL KNEE ARTHROPLASTY     Left        Home Medications    Prior to Admission medications   Medication Sig Start Date End Date Taking? Authorizing Provider  albuterol (PROVENTIL) (2.5 MG/3ML) 0.083% nebulizer solution Take 2.5 mg by nebulization every 6 (six) hours as needed for wheezing or shortness of breath.  01/11/17   [provider]  allopurinol (ZYLOPRIM) 100 MG tablet Take 100 mg by mouth every evening.     [provider]  Amantadine HCl 100 MG tablet Take 1 tablet by mouth daily.    [provider]  ANORO ELLIPTA 62.5-25 MCG/INH AEPB Inhale 1 puff into the lungs daily. 04/19/16   [provider]  ascorbic acid (VITAMIN C) 500 MG tablet Take 500 mg by mouth  daily.     [provider]  atorvastatin (LIPITOR) 80 MG tablet Take 80 mg by mouth every evening.     [provider]  benzonatate (TESSALON) 100 MG capsule Take 100 mg by mouth 3 (three) times daily as needed for cough.  01/06/17   [provider]  calcium-vitamin D (OSCAL WITH D) 500-200 MG-UNIT per tablet Take 1 tablet by mouth daily.     [provider]  carbidopa-levodopa (SINEMET IR) 25-100 MG tablet Take 1 tablet by mouth 2 (two) times a day.    [provider]  COLCRYS 0.6 MG tablet Take 1 tablet by mouth daily as needed (gout pain).  07/07/15   [provider]  diclofenac sodium (VOLTAREN) 1 % GEL Apply 1 application topically daily. Applied to feet 05/20/15   [provider]  dicyclomine (  BENTYL) 10 MG capsule Take 1 capsule (10 mg total) by mouth daily. 12/11/18   Setzer, Rona Ravens, NP  dicyclomine (BENTYL) 10 MG capsule Take 1 capsule (10 mg total) by mouth 2 (two) times daily as needed for up to 14 days for spasms. 04/19/19 05/03/19  Noralyn Pick, NP  digoxin (LANOXIN) 0.125 MG tablet Take 1 tablet (0.125 mg total) by mouth daily. 03/29/19   Barrett, Evelene Croon, PA-C  diltiazem (CARDIZEM CD) 120 MG 24 hr capsule Take 1 capsule (120 mg total) by mouth daily. As directed based off blood pressure readings 03/29/19   Barrett, Evelene Croon, PA-C  DULoxetine (CYMBALTA) 30 MG capsule Take 1 capsule by mouth daily. 01/31/19   [provider]  fenofibrate (TRICOR) 145 MG tablet Take 1 tablet (145 mg total) by mouth daily. Patient taking differently: Take 145 mg by mouth every evening.  08/16/11   de Stanford Scotland, MD  fentaNYL (DURAGESIC - DOSED MCG/HR) 25 MCG/HR patch Place 25 mcg onto the skin every 3 (three) days.    [provider]  ferrous sulfate 325 (65 FE) MG tablet Take 325 mg by mouth daily with breakfast.    [provider]  finasteride (PROSCAR) 5 MG tablet Take 5 mg by mouth daily.      [provider]  furosemide (LASIX) 40 MG tablet Take 40 mg by mouth every evening. *May take 80mg  as needed for swelling    [provider]  hydrALAZINE (APRESOLINE) 25 MG tablet Take 1 tablet by mouth 2 (two) times daily. 02/28/19   [provider]  HYDROcodone-acetaminophen (NORCO) 10-325 MG tablet Take 1 tablet by mouth every 6 (six) hours as needed for moderate pain or severe pain.  01/21/17   [provider]  hydrOXYzine (VISTARIL) 25 MG capsule Take 1 capsule by mouth 2 (two) times daily as needed. 01/31/19   [provider]  ipratropium-albuterol (DUONEB) 0.5-2.5 (3) MG/3ML SOLN Take 3 mLs by nebulization 3 (three) times daily. 01/20/17   Mannam, Hart Robinsons, MD  lidocaine (LIDODERM) 5 % Place 1 patch onto the skin daily as needed (for pain). Every 12 hours as needed 08/29/15   [provider]  lisinopril (PRINIVIL,ZESTRIL) 20 MG tablet Take 20 mg by mouth daily. Take one tablet by mouth if blood pressure is over 150    [provider]  magnesium oxide (MAG-OX) 400 (241.3 MG) MG tablet Take 1 tablet (400 mg total) by mouth 2 (two) times daily. 01/06/15   Reyne Dumas, MD  methocarbamol (ROBAXIN) 500 MG tablet Take 1 tablet by mouth 4 (four) times daily as needed. 02/01/19   [provider]  metoprolol tartrate (LOPRESSOR) 50 MG tablet Take 50 mg by mouth 2 (two) times daily.    [provider]  Multiple Vitamin (MULTIVITAMIN) tablet Take 1 tablet by mouth daily.      [provider]  nitroGLYCERIN (NITROSTAT) 0.4 MG SL tablet Place 1 tablet (0.4 mg total) under the tongue every 5 (five) minutes x 3 doses as needed. 01/07/15   Carlena Bjornstad, MD  Omega-3 Fatty Acids (FISH OIL) 1000 MG CAPS Take 2 capsules by mouth daily.     [provider]  pantoprazole (PROTONIX) 40 MG tablet Take 40 mg by mouth 2 (two) times daily.     [provider]  potassium chloride SA (K-DUR) 20 MEQ tablet Take 1 tablet (20 mEq total)  by mouth daily. 03/29/19   Barrett, Evelene Croon, PA-C  PROAIR  HFA 108 (90 Base) MCG/ACT inhaler Inhale 2 puffs into the lungs 2 (two) times daily as needed for wheezing or shortness of breath. 2 puffs twice daily 08/20/15   [provider]  rivaroxaban (XARELTO) 20 MG TABS tablet Take 1 tablet (20 mg total) by mouth daily. 01/27/15   Reyne Dumas, MD  rivastigmine (EXELON) 9.5 mg/24hr APPLY 1 PATCH DAILY. 03/21/17   Cameron Sprang, MD  senna (SENOKOT) 8.6 MG TABS tablet Take 1 tablet (8.6 mg total) by mouth daily. 06/05/19 07/05/19  Manuella Ghazi, Pratik D, DO  tamsulosin (FLOMAX) 0.4 MG CAPS capsule Take 0.4 mg by mouth daily after supper.    [provider]    Family History Family History  Problem Relation Age of Onset  . Diabetes Daughter   . Hypertension Daughter   . Obesity Daughter   . Cancer Maternal Grandmother     Social History Social History   Tobacco Use  . Smoking status: Former Smoker    Packs/day: 1.00    Years: 30.00    Pack years: 30.00    Types: Cigarettes    Start date: 08/31/1956    Quit date: 08/17/2003    Years since quitting: 15.8  . Smokeless tobacco: Never Used  . Tobacco comment: Patient smoked a pack a day for about 30 years  Substance Use Topics  . Alcohol use: No    Alcohol/week: 0.0 standard drinks  . Drug use: No    Types: Other-see comments     Allergies   Latex, Omeprazole, Other, Penicillins, Sulfonamide derivatives, and Iodine   Review of Systems Review of Systems  Constitutional: Positive for diaphoresis (per family). Negative for chills and fever.  Respiratory: Positive for choking. Negative for shortness of breath.   Cardiovascular: Positive for chest pain (? not at present).  Gastrointestinal: Positive for abdominal pain (? epigastric, not at present). Negative for blood in stool, constipation, diarrhea and vomiting.  Neurological: Positive for weakness (generalized) and light-headedness. Negative for syncope, speech difficulty  and headaches.  All other systems reviewed and are negative.    Physical Exam Updated Vital Signs BP 76/42   Pulse46   Temp (!) 97 F (36.4 C)   Resp 28   Wt 116.2 kg   SpO2 99%   BMI 34.74 kg/m   Physical Exam Vitals signs and nursing note reviewed. Exam conducted with a chaperone present.  Constitutional:      General: He is not in acute distress.    Appearance: He is well-developed. He is ill-appearing.  HENT:     Head: Normocephalic and atraumatic.  Eyes:     General:        Right eye: No discharge.        Left eye: No discharge.     Extraocular Movements: Extraocular movements intact.     Conjunctiva/sclera: Conjunctivae normal.     Pupils: Pupils are equal, round, and reactive to light.  Neck:     Musculoskeletal: Neck supple. No neck rigidity.  Cardiovascular:     Rate and Rhythm: Bradycardia present. Rhythm irregularly irregular.  Pulmonary:     Effort: No respiratory distress.     Comments: No obvious adventitious breath sounds.  Abdominal:     General: There is no distension.     Palpations: Abdomen is soft.     Tenderness: There is no abdominal tenderness. There is no guarding or rebound.  Genitourinary:    Comments: DRE w/ soft brown stool. No melena/hematochezia.  Skin:  General: Skin is warm and dry.     Findings: No rash.  Neurological:     Mental Status: He is alert.     Comments: Clear speech.  Somewhat slow to respond.  Not dysarthric or aphasic.  CN III through XII grossly intact.  Sensation grossly intact bilateral upper and lower extremities.  Moving all extremities.  Psychiatric:        Behavior: Behavior normal.    ED Treatments / Results  Labs (all labs ordered are listed, but only abnormal results are displayed) Labs Reviewed  COMPREHENSIVE METABOLIC PANEL - Abnormal; Notable for the following components:      Result Value   Sodium 134 (*)    CO2 17 (*)    Glucose, Bld 181 (*)    BUN 105 (*)    Creatinine, Ser 1.68 (*)     Calcium 7.8 (*)    Total Protein 6.0 (*)    Albumin 2.4 (*)    AST 13 (*)    GFR calc non Af Amer 39 (*)    GFR calc Af Amer 45 (*)    All other components within normal limits  CBC WITH DIFFERENTIAL/PLATELET - Abnormal; Notable for the following components:   WBC 19.9 (*)    RBC 3.50 (*)    Hemoglobin 10.5 (*)    HCT 34.2 (*)    RDW 16.1 (*)    Neutro Abs 15.8 (*)    Monocytes Absolute 1.3 (*)    Abs Immature Granulocytes 0.63 (*)    All other components within normal limits  APTT - Abnormal; Notable for the following components:   aPTT 44 (*)    All other components within normal limits  PROTIME-INR - Abnormal; Notable for the following components:   Prothrombin Time 35.4 (*)    INR 3.6 (*)    All other components within normal limits  URINALYSIS, ROUTINE W REFLEX MICROSCOPIC - Abnormal; Notable for the following components:   APPearance HAZY (*)    Hgb urine dipstick MODERATE (*)    Leukocytes,Ua TRACE (*)    All other components within normal limits  DIGOXIN LEVEL - Abnormal; Notable for the following components:   Digoxin Level <0.2 (*)    All other components within normal limits  CBG MONITORING, ED - Abnormal; Notable for the following components:   Glucose-Capillary 192 (*)    All other components within normal limits  TROPONIN I (HIGH SENSITIVITY) - Abnormal; Notable for the following components:   Troponin I (High Sensitivity) 19 (*)    All other components within normal limits  CULTURE, BLOOD (ROUTINE X 2)  CULTURE, BLOOD (ROUTINE X 2)  URINE CULTURE  SARS CORONAVIRUS 2 (TAT 6-24 HRS)  LACTIC ACID, PLASMA  LACTIC ACID, PLASMA  POC OCCULT BLOOD, ED  TROPONIN I (HIGH SENSITIVITY)    EKG EKG Interpretation  Date/Time:  Saturday June 30 2019 18:16:35 EST Ventricular Rate:  41 PR Interval:    QRS Duration: 122 QT Interval:  446 QTC Calculation: 369 R Axis:   65 Text Interpretation: Atrial fibrillation Nonspecific intraventricular conduction delay  Probable anteroseptal infarct, old Nonspecific T abnormalities, lateral leads Confirmed by Milton Ferguson [phone]) on 07/08/2019 6:31:35 PM   Radiology Dg Chest Port 1 View  Result Date: 06/21/2019 CLINICAL DATA:  Hypotension EXAM: PORTABLE CHEST 1 VIEW COMPARISON:  06/04/2019 FINDINGS: Cardiomegaly with vascular congestion. Large hiatal hernia. Left lower lobe atelectasis or infiltrate. No overt edema or definite significant effusions. No acute bony abnormality. IMPRESSION: Cardiomegaly.  Left lower lobe atelectasis or infiltrate. Large hiatal hernia. Electronically Signed   By: Rolm Baptise M.D.   On: 07/06/2019 19:25    Procedures .Critical Care Performed by: Amaryllis Dyke, PA-C Authorized by: Amaryllis Dyke, PA-C    CRITICAL CARE Performed by: Kennith Maes   Total critical care time: 40 minutes  Critical care time was exclusive of separately billable procedures and treating other patients.  Critical care was necessary to treat or prevent imminent or life-threatening deterioration.  Critical care was time spent personally by me on the following activities: development of treatment plan with patient and/or surrogate as well as nursing, discussions with consultants, evaluation of patient's response to treatment, examination of patient, obtaining history from patient or surrogate, ordering and performing treatments and interventions, ordering and review of laboratory studies, ordering and review of radiographic studies, pulse oximetry and re-evaluation of patient's condition.   (including critical care time)  Medications Ordered in ED Medications  vancomycin (VANCOCIN) IVPB 1000 mg/200 mL premix (1,000 mg Intravenous New Bag/Given 07/07/2019 2029)  vancomycin (VANCOCIN) 1,250 mg in sodium chloride 0.9 % 250 mL IVPB (has no administration in time range)  ceFEPIme (MAXIPIME) 2 g in sodium chloride 0.9 % 100 mL IVPB (has no administration in time range)   metroNIDAZOLE (FLAGYL) IVPB 500 mg (0 mg Intravenous Stopped 07/08/2019 2017)  atropine 1 MG/10ML injection 1 mg (1 mg Intravenous Given 06/25/2019 1854)  sodium chloride 0.9 % bolus 1,000 mL (0 mLs Intravenous Stopped 06/26/2019 1930)  sodium chloride 0.9 % bolus 1,000 mL (0 mLs Intravenous Stopped 06/26/2019 2017)  ceFEPIme (MAXIPIME) 2 g in sodium chloride 0.9 % 100 mL IVPB (0 g Intravenous Stopped 06/24/2019 2026)     Initial Impression / Assessment and Plan / ED Course  I have reviewed the triage vital signs and the nursing notes.  Pertinent labs & imaging results that were available during my care of the patient were reviewed by me and considered in my medical decision making (see chart for details).   Patient presents to the emergency department via EMS for generalized weakness and lightheadedness.  On arrival he is notably hypotensive with BP [id number] systolic & bradycardia in A. fib.  He is responsive.  Atropine was given with improvement of heart rate to the 60s to 70s, he remained hypotensive- started with 2 L of fluids ordered for boluses, hold off on 30 cc/kg bolus for now with continued monitoring secondary to his cardiac history per discussion with supervising physician Dr. Roderic Palau. Code sepsis activated, start broad spectrum abx.   CBC: Leukocytosis at 19.9 with left shift.  Anemia is similar to prior. CMP: Acute kidney injury with creatinine 1.68 BUN 105 increased from most recent 3 weeks ago 0.61 & 26 respectively.  Hypocalcemic.  LFTs WNL Lactic acid: WNL Digoxin level not elevated- not suggestive of toxicity.  Urinalysis with hematuria which has been present previously, no obvious UTI. Troponin: 19 Initial EKG with bradycardia & afib- repeat EKG w/ improved rate s/p atropine.  No STEMI. Chest Xray:  Cardiomegaly.  Left lower lobe atelectasis or infiltrate. Large hiatal hernia.    BP substantially improved to 120s/80s, HR remains 60s-70s. Re-discussed w/ Dr. Roderic Palau- given improved vitals  he recommends continuing to hold off on 3rd liter of fluid @ this time which I am in agreement with. Concern for AKI  in setting of initially hypotensive bradycardic patient w/ what seems to have been somewhat of a near syncopal event. There is also question of pneumonia  on CXR, leukocytosis noted, lactic however WNL, broad spectrum abx were initiated on initial assessment. Will consult for admission.   21:16: CONSULT: Discussed with hospitalist Dr. Nehemiah Settle- accepts admission.   This is a shared visit with supervising physician Dr. Roderic Palau who has independently evaluated patient & provided guidance in evaluation/management/disposition, in agreement with care   Sepsis - Repeat Assessment  Performed at:    21:32  Vitals     Blood pressure 114/81, pulse 69, temperature (!) 97.5 F (36.4 C), resp. rate 16, weight 116.2 kg, SpO2 99 %.  Heart:     Bradycardic  Lungs:    CTA  Capillary Refill:   <2 sec  Peripheral Pulse:   Radial pulse palpable  Skin:     Normal Color   Bryce Berry was evaluated in Emergency Department on 06/22/2019 for the symptoms described in the history of present illness. He/she was evaluated in the context of the global COVID-19 pandemic, which necessitated consideration that the patient might be at risk for infection with the SARS-CoV-2 virus that causes COVID-19. Institutional protocols and algorithms that pertain to the evaluation of patients at risk for COVID-19 are in a state of rapid change based on information released by regulatory bodies including the CDC and federal and state organizations. These policies and algorithms were followed during the patient's care in the ED.  Final Clinical Impressions(s) / ED Diagnoses   Final diagnoses:  AKI (acute kidney injury) ([place])  Hypotension, unspecified hypotension type  Bradycardia  Near syncope    ED Discharge Orders    None       Bryce Berry 06/27/2019 2137    Milton Ferguson, MD 07/15/2019  2250

## 2019-06-30 NOTE — ED Notes (Signed)
Pt turned on R side, bair hugger placed per MD order

## 2019-06-30 NOTE — Progress Notes (Signed)
Pharmacy Antibiotic Note  Bryce Berry is a 78 y.o. male admitted on 06/25/2019 with unknown source.  Pharmacy has been consulted for Vancomycin and cefepime dosing.  Plan: Vancomycin 2000mg  loading dose, then 1250mg   IV every 24 hours.  Goal trough 15-20 mcg/mL.  Cefepime 2gm IV q12h F/U cxs and clinical progress Monitor V/S, labs and levels as indicated  Weight: 256 lb 2.8 oz (116.2 kg)  Temp (24hrs), Avg:97 F (36.1 C), Min:97 F (36.1 C), Max:97 F (36.1 C)  No results for input(s): WBC, CREATININE, LATICACIDVEN, VANCOTROUGH, VANCOPEAK, VANCORANDOM, GENTTROUGH, GENTPEAK, GENTRANDOM, TOBRATROUGH, TOBRAPEAK, TOBRARND, AMIKACINPEAK, AMIKACINTROU, AMIKACIN in the last 168 hours.  CrCl cannot be calculated (Patient's most recent lab result is older than the maximum 21 days allowed.).    Allergies  Allergen Reactions  . Latex   . Omeprazole Other (See Comments)    Reaction unknown-possible upset stomach  . Other     Paper tape  . Penicillins Other (See Comments)    Has patient had a PCN reaction causing immediate rash, facial/tongue/throat swelling, SOB or lightheadedness with hypotension: No Has patient had a PCN reaction causing severe rash involving mucus membranes or skin necrosis: No Has patient had a PCN reaction that required hospitalization No Has patient had a PCN reaction occurring within the last 10 years: No If all of the above answers are "NO", then may proceed with Cephalosporin use.    Rapid heart rate; flushed  . Sulfonamide Derivatives Other (See Comments)    Rapid heart rate; flushed  . Iodine Rash    Antimicrobials this admission: Vancomycin 11/14 >>  Cefepime 11/14 >>  Flagyl 11/14>>  Microbiology results: 11/14 BCx: pending 11/14 UCx: pending  11/14SARs-2 CV: pending  MRSA PCR:   Thank you for allowing pharmacy to be a part of this patient's care.  Isac Sarna, BS [place], [place] Clinical Pharmacist Pager [phone] 06/27/2019 7:19 PM

## 2019-06-30 NOTE — H&P (Signed)
History and Physical  CAINAN ESTEVES [id number] DOB: Feb 12, 1941 DOA: 06/29/2019  Referring physician: Linus Galas, PA-C, ED provider PCP: Glenda Chroman, MD  Outpatient Specialists:   Patient Coming From: home  Chief Complaint: weakness  HPI: STEFFIN SMEDLEY is a 78 y.o. male with a history of A. fib/a flutter on Xarelto, history of PE and DVT, coronary artery disease with stents, BPH, COPD, hypertension, GERD, hyperlipidemia.  Patient seen for sudden onset of weakness at dinner with presyncopal symptoms.  No syncopal episode occurred but he became very diaphoretic.  EMS was called and the patient was found to be hypotensive on their arrival.  Patient was brought to the emergency department where his initial blood pressure was 76/42 and bradycardic.  Patient received atropine and IV fluids with good response to his blood pressure.  No palliating or provoking factors.  Symptoms are now improving with fluid resuscitation and atropine.  Patient does have baseline cough and appears to be mildly hypothermic.  Emergency Department Course: Patient received broad-spectrum antibiotics: Vanco, cefepime, and Flagyl.  He was fluid resuscitated with 2 L of fluids.  Chest x-ray shows left lower lobe infiltrates or atelectasis.  White count 19. Cr 1.6  Review of Systems:   Pt denies any fevers, chills, nausea, vomiting, diarrhea, constipation, abdominal pain, shortness of breath, dyspnea on exertion, orthopnea, cough, wheezing, palpitations, headache, vision changes, lightheadedness, dizziness, melena, rectal bleeding.  Review of systems are otherwise negative  Past Medical History:  Diagnosis Date  . Atrial fibrillation ([place])   . Atrial flutter ([place])    Possible history in the past - details unclear  . Bilateral pulmonary embolism ([place])    a. 10/2012 - V:Q scan high prob for Bilat PE in setting of dyspnea/hemoptysis-->anticoagulation started.  Marland Kitchen BPH (benign prostatic hyperplasia)   . CAD  (coronary artery disease)    Stent PCI in [place] and also [place] in the past - details not clear  . Chronic anticoagulation   . Chronic pain   . 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 COPD (chronic obstructive pulmonary disease) ([place])   . Depression   . Dyslipidemia   . Essential hypertension   . GERD (gastroesophageal reflux disease)   . GI bleed   . Gout   . Hyperlipidemia    Mixed  . Irritable bowel   . Osteoarthritis   . Osteoporosis   . Peripheral arterial disease ([place])    Dopplers Dr.Vyas office October, 2010, apparent occlusion anterior tibial arteries bilaterally., findings suggest hemodynamically significant stenosis of the proximal right provisional femoral artery and the mid left superficial artery.  Ankle-brachial indices indicate moderate stenosis within both lower extremities  . Posttraumatic stress disorder   . Recurrent UTI (urinary tract infection)   . Right leg DVT ([place])    a. 10/2012 - subocclusive DVT R popliteal vein.  . Wheelchair bound    Past Surgical History:  Procedure Laterality Date  . [place]  . CATARACT EXTRACTION    . COLON RESECTION    . COLONOSCOPY N/A 03/13/2013   Procedure: COLONOSCOPY;  Surgeon: Rogene Houston, MD;  Location: [facility];  Service: Endoscopy;  Laterality: N/A;  730  . COLONOSCOPY N/A 12/22/2014   Procedure: COLONOSCOPY;  Surgeon: Rogene Houston, MD;  Location: [facility];  Service: Endoscopy;  Laterality: N/A;  . CORONARY ANGIOPLASTY WITH STENT PLACEMENT     x 4  . GIVENS CAPSULE STUDY N/A  12/24/2014   Procedure: GIVENS CAPSULE STUDY;  Surgeon: Rogene Houston, MD;  Location: [facility];  Service: Endoscopy;  Laterality: N/A;  . KNEE ARTHROPLASTY     Right  . PROSTATE SURGERY    . TOTAL HIP ARTHROPLASTY     Right-complicated hx  . TOTAL KNEE ARTHROPLASTY     Left   Social History:  reports that he quit smoking about 15 years ago. His smoking use included cigarettes. He  started smoking about 62 years ago. He has a 30.00 pack-year smoking history. He has never used smokeless tobacco. He reports that he does not drink alcohol or use drugs. Patient lives at home  Allergies  Allergen Reactions  . Latex   . Omeprazole Other (See Comments)    Reaction unknown-possible upset stomach  . Other     Paper tape  . Penicillins Other (See Comments)    Has patient had a PCN reaction causing immediate rash, facial/tongue/throat swelling, SOB or lightheadedness with hypotension: No Has patient had a PCN reaction causing severe rash involving mucus membranes or skin necrosis: No Has patient had a PCN reaction that required hospitalization No Has patient had a PCN reaction occurring within the last 10 years: No If all of the above answers are "NO", then may proceed with Cephalosporin use.    Rapid heart rate; flushed  . Sulfonamide Derivatives Other (See Comments)    Rapid heart rate; flushed  . Iodine Rash    Family History  Problem Relation Age of Onset  . Diabetes Daughter   . Hypertension Daughter   . Obesity Daughter   . Cancer Maternal Grandmother       Prior to Admission medications   Medication Sig Start Date End Date Taking? Authorizing Provider  albuterol (PROVENTIL) (2.5 MG/3ML) 0.083% nebulizer solution Take 2.5 mg by nebulization every 6 (six) hours as needed for wheezing or shortness of breath.  01/11/17  Yes [provider]  allopurinol (ZYLOPRIM) 100 MG tablet Take 100 mg by mouth every evening.    Yes [provider]  ANORO ELLIPTA 62.5-25 MCG/INH AEPB Inhale 1 puff into the lungs daily. 04/19/16  Yes [provider]  ascorbic acid (VITAMIN C) 500 MG tablet Take 500 mg by mouth daily.    Yes [provider]  atorvastatin (LIPITOR) 80 MG tablet Take 80 mg by mouth every evening.    Yes [provider]  benzonatate (TESSALON) 100 MG capsule Take 100 mg by mouth 3 (three) times daily as needed for cough.   01/06/17  Yes [provider]  calcium-vitamin D (OSCAL WITH D) 500-200 MG-UNIT per tablet Take 1 tablet by mouth daily.    Yes [provider]  carbidopa-levodopa (SINEMET IR) 25-100 MG tablet Take 1 tablet by mouth 2 (two) times a day.   Yes [provider]  carvedilol (COREG) 12.5 MG tablet Take 12.5 mg by mouth 2 (two) times daily with a meal.   Yes [provider]  COLCRYS 0.6 MG tablet Take 1 tablet by mouth daily as needed (gout pain).  07/07/15  Yes [provider]  dicyclomine (BENTYL) 10 MG capsule Take 1 capsule (10 mg total) by mouth daily. 12/11/18  Yes Setzer, Rona Ravens, NP  digoxin (LANOXIN) 0.125 MG tablet Take 1 tablet (0.125 mg total) by mouth daily. 03/29/19  Yes Barrett, Evelene Croon, PA-C  diltiazem (CARDIZEM CD) 120 MG 24 hr capsule Take 1 capsule (120 mg total) by mouth daily. As directed based off blood  pressure readings 03/29/19  Yes Barrett, Evelene Croon, PA-C  DULoxetine (CYMBALTA) 30 MG capsule Take 1 capsule by mouth daily. 01/31/19  Yes [provider]  fenofibrate (TRICOR) 145 MG tablet Take 1 tablet (145 mg total) by mouth daily. Patient taking differently: Take 145 mg by mouth every evening.  08/16/11  Yes de Stanford Scotland, MD  fentaNYL (DURAGESIC - DOSED MCG/HR) 25 MCG/HR patch Place 25 mcg onto the skin every 3 (three) days.   Yes [provider]  ferrous sulfate 325 (65 FE) MG tablet Take 325 mg by mouth daily with breakfast.   Yes [provider]  finasteride (PROSCAR) 5 MG tablet Take 5 mg by mouth daily.     Yes [provider]  furosemide (LASIX) 40 MG tablet Take 40 mg by mouth every evening. *May take 80mg  as needed for swelling   Yes [provider]  HYDROcodone-acetaminophen (NORCO) 10-325 MG tablet Take 1 tablet by mouth every 6 (six) hours as needed for moderate pain or severe pain.  01/21/17  Yes [provider]  hydrOXYzine (VISTARIL) 25 MG capsule Take 1 capsule by mouth 2  (two) times daily as needed. 01/31/19  Yes [provider]  ipratropium-albuterol (DUONEB) 0.5-2.5 (3) MG/3ML SOLN Take 3 mLs by nebulization 3 (three) times daily. 01/20/17  Yes Mannam, Praveen, MD  lisinopril (PRINIVIL,ZESTRIL) 20 MG tablet Take 20 mg by mouth daily. Take one tablet by mouth if blood pressure is over 150   Yes [provider]  LORazepam (ATIVAN) 0.5 MG tablet Take 0.5 mg by mouth daily as needed for anxiety.   Yes [provider]  magnesium oxide (MAG-OX) 400 (241.3 MG) MG tablet Take 1 tablet (400 mg total) by mouth 2 (two) times daily. 01/06/15  Yes Reyne Dumas, MD  methocarbamol (ROBAXIN) 500 MG tablet Take 1 tablet by mouth 4 (four) times daily as needed. 02/01/19  Yes [provider]  Multiple Vitamin (MULTIVITAMIN) tablet Take 1 tablet by mouth daily.     Yes [provider]  Omega-3 Fatty Acids (FISH OIL) 1000 MG CAPS Take 2 capsules by mouth daily.    Yes [provider]  pantoprazole (PROTONIX) 40 MG tablet Take 40 mg by mouth 2 (two) times daily.    Yes [provider]  potassium chloride SA (K-DUR) 20 MEQ tablet Take 1 tablet (20 mEq total) by mouth daily. 03/29/19  Yes Barrett, Evelene Croon, PA-C  PROAIR HFA 108 (90 Base) MCG/ACT inhaler Inhale 2 puffs into the lungs 2 (two) times daily as needed for wheezing or shortness of breath. 2 puffs twice daily 08/20/15  Yes [provider]  rivaroxaban (XARELTO) 20 MG TABS tablet Take 1 tablet (20 mg total) by mouth daily. 01/27/15  Yes Reyne Dumas, MD  senna (SENOKOT) 8.6 MG TABS tablet Take 1 tablet (8.6 mg total) by mouth daily. 06/05/19 07/05/19 Yes Shah, Pratik D, DO  tamsulosin (FLOMAX) 0.4 MG CAPS capsule Take 0.4 mg by mouth daily after supper.   Yes [provider]  Amantadine HCl 100 MG tablet Take 1 tablet by mouth daily.    [provider]  diclofenac sodium (VOLTAREN) 1 % GEL Apply 1 application topically daily. Applied to feet 05/20/15    [provider]  dicyclomine (BENTYL) 10 MG capsule Take 1 capsule (10 mg total) by mouth 2 (two) times daily as needed for up to 14 days for spasms. 04/19/19 05/03/19  Noralyn Pick, NP  hydrALAZINE (APRESOLINE) 25 MG tablet  Take 1 tablet by mouth 2 (two) times daily. 02/28/19   [provider]  lidocaine (LIDODERM) 5 % Place 1 patch onto the skin daily as needed (for pain). Every 12 hours as needed 08/29/15   [provider]  metoprolol tartrate (LOPRESSOR) 50 MG tablet Take 50 mg by mouth 2 (two) times daily.    [provider]  nitroGLYCERIN (NITROSTAT) 0.4 MG SL tablet Place 1 tablet (0.4 mg total) under the tongue every 5 (five) minutes x 3 doses as needed. 01/07/15   Carlena Bjornstad, MD  rivastigmine (EXELON) 9.5 mg/24hr APPLY 1 PATCH DAILY. 03/21/17   Cameron Sprang, MD    Physical Exam: BP 114/81   Pulse 69   Temp (!) 97.5 F (36.4 C)   Resp 16   Wt 116.2 kg   SpO2 99%   BMI 34.74 kg/m   . General: Elderly male. Awake and alert and oriented x3. No acute cardiopulmonary distress.  Marland Kitchen HEENT: Normocephalic atraumatic.  Right and left ears normal in appearance.  Pupils equal, round, reactive to light. Extraocular muscles are intact. Sclerae anicteric and noninjected.  Moist mucosal membranes. No mucosal lesions.  . Neck: Neck supple without lymphadenopathy. No carotid bruits. No masses palpated.  . Cardiovascular: Regular rate with normal S1-S2 sounds. No murmurs, rubs, gallops auscultated. No JVD.  Marland Kitchen Respiratory: diminished breathsounds. Rales in left base. No wheezing.  No accessory muscle use. . Abdomen: Soft, mild LUQ tenderness. No rebound or guarding. Nondistended. Active bowel sounds. No masses or hepatosplenomegaly  . Skin: No rashes, lesions, or ulcerations.  Dry, warm to touch. 2+ dorsalis pedis and radial pulses. . Musculoskeletal: No calf or leg pain. All major joints not erythematous nontender.  No upper or lower joint deformation.   Good ROM.  No contractures  . Psychiatric: Intact judgment and insight. Pleasant and cooperative. . Neurologic: No focal neurological deficits. Strength is 5/5 and symmetric in upper and lower extremities.  Cranial nerves II through XII are grossly intact.           Labs on Admission: I have personally reviewed following labs and imaging studies  CBC: Recent Labs  Lab 06/29/2019 1910  WBC 19.9*  NEUTROABS 15.8*  HGB 10.5*  HCT 34.2*  MCV 97.7  PLT 99991111   Basic Metabolic Panel: Recent Labs  Lab 06/26/2019 1910  NA 134*  K 5.0  CL 111  CO2 17*  GLUCOSE 181*  BUN 105*  CREATININE 1.68*  CALCIUM 7.8*   GFR: Estimated Creatinine Clearance: 49.2 mL/min (A) (by C-G formula based on SCr of 1.68 mg/dL (H)). Liver Function Tests: Recent Labs  Lab 06/29/2019 1910  AST 13*  ALT 6  ALKPHOS 46  BILITOT 0.7  PROT 6.0*  ALBUMIN 2.4*   No results for input(s): LIPASE, AMYLASE in the last 168 hours. No results for input(s): AMMONIA in the last 168 hours. Coagulation Profile: Recent Labs  Lab 06/18/2019 1910  INR 3.6*   Cardiac Enzymes: No results for input(s): CKTOTAL, CKMB, CKMBINDEX, TROPONINI in the last 168 hours. BNP (last 3 results) No results for input(s): PROBNP in the last 8760 hours. HbA1C: No results for input(s): HGBA1C in the last 72 hours. CBG: Recent Labs  Lab 06/18/2019 1826  GLUCAP 192*   Lipid Profile: No results for input(s): CHOL, HDL, LDLCALC, TRIG, CHOLHDL, LDLDIRECT in the last 72 hours. Thyroid Function Tests: No results for input(s): TSH, T4TOTAL, FREET4, T3FREE, THYROIDAB in the last 72 hours. Anemia Panel: No results for  input(s): VITAMINB12, FOLATE, FERRITIN, TIBC, IRON, RETICCTPCT in the last 72 hours. Urine analysis:    Component Value Date/Time   COLORURINE YELLOW 07/05/2019 1931   APPEARANCEUR HAZY (A) 07/10/2019 1931   LABSPEC 1.016 07/07/2019 1931   PHURINE 5.0 07/01/2019 1931   GLUCOSEU NEGATIVE 07/13/2019 1931   HGBUR MODERATE (A)  07/04/2019 1931   BILIRUBINUR NEGATIVE 07/11/2019 1931   KETONESUR NEGATIVE 07/03/2019 1931   PROTEINUR NEGATIVE 07/14/2019 1931   UROBILINOGEN 0.2 12/21/2014 0848   NITRITE NEGATIVE 07/15/2019 1931   LEUKOCYTESUR TRACE (A) 06/17/2019 1931   Sepsis Labs: @LABRCNTIP (procalcitonin:4,lacticidven:4) ) Recent Results (from the past 240 hour(s))  Blood Culture (routine x 2)     Status: None (Preliminary result)   Collection Time: 06/29/2019  7:10 PM   Specimen: BLOOD RIGHT HAND  Result Value Ref Range Status   Specimen Description BLOOD RIGHT HAND  Final   Special Requests   Final    BOTTLES DRAWN AEROBIC ONLY Blood Culture adequate volume Performed at [facility], [address] [place], [place] [address]    Culture PENDING  Incomplete   Report Status PENDING  Incomplete  Blood Culture (routine x 2)     Status: None (Preliminary result)   Collection Time: 06/23/2019  7:25 PM   Specimen: BLOOD RIGHT WRIST  Result Value Ref Range Status   Specimen Description BLOOD RIGHT WRIST  Final   Special Requests   Final    BOTTLES DRAWN AEROBIC ONLY Blood Culture adequate volume Performed at [facility], [address], [place], [place] [address]    Culture PENDING  Incomplete   Report Status PENDING  Incomplete     Radiological Exams on Admission: Dg Chest Port 1 View  Result Date: 07/08/2019 CLINICAL DATA:  Hypotension EXAM: PORTABLE CHEST 1 VIEW COMPARISON:  06/04/2019 FINDINGS: Cardiomegaly with vascular congestion. Large hiatal hernia. Left lower lobe atelectasis or infiltrate. No overt edema or definite significant effusions. No acute bony abnormality. IMPRESSION: Cardiomegaly.  Left lower lobe atelectasis or infiltrate. Large hiatal hernia. Electronically Signed   By: Rolm Baptise M.D.   On: 06/27/2019 19:25    EKG: Independently reviewed. Bradycardic A fib/flutter with nonspecific intraventricular conduction delay. No ST changes.   Assessment/Plan: Principal Problem:   Severe  sepsis with septic shock [facility]) Active Problems:   Essential hypertension   Atrial fibrillation (H[facility])   Acute renal injury ([place])   COPD (chronic obstructive pulmonary disease) (HCC)   GERD (gastroesophageal reflux disease)   Hypertension   CAD (coronary artery disease)   History of pulmonary embolism   CAP (community acquired pneumonia)   Elevated blood sugar    This patient was discussed with the ED physician, including pertinent vitals, physical exam findings, labs, and imaging.  We also discussed care given by the ED provider.  1. Severe sepsis with septic shock a. Patient meets criteria for septic shock by hypotension, hypothermia, elevated white count, source of infection, and endorgan damage. b. Patient still hypothermic c. Admit to stepdown unit d. Patient placed on Bear hugger e. Vital signs currently stable f. Patient does have a slightly elevated troponin, which is likely secondary to septic shock.  Repeat troponin is pending 2. Community-acquired pneumonia Antibiotics: Cefepime and azithromycin Robitussin Blood cultures drawn in the emergency department Sputum cultures CBC tomorrow Strep antigen by urine Respiratory virus panel 3. AKI a. Secondary to the septic shock b. Gentle IV fluids c. Recheck creatinine in the morning 4. A. fib with RVR a. Rate controlled b. Continue digoxin  c. Continue Cardizem in the morning d. Hold carvedilol e. Patient on Xarelto 5. Hypertension a. We will hold carvedilol and other antihypertensives 6. Coronary artery disease a. Currently stable 7. COPD a. No evidence of COPD exacerbation 8. Elevated blood sugar a. Patient likely has diabetes b. We will check hemoglobin A1c c. Check CBGs before meals and nightly and cover with sliding scale insulin 9. History of PE a. On Xarelto 10. GERD  DVT prophylaxis: On Xarelto Consultants: None Code Status: Full code confirmed by patient Family Communication: Wife present during  interview Disposition Plan: Pending   Truett Mainland, DO

## 2019-06-30 NOTE — ED Triage Notes (Signed)
Initial call to EMS was for CP, but when EMS arrived pt was lethargic and able to arouse when name called. Wife reports pt ate supper and then became altered . Pt normally alert and talking CBG 231. HR in the 40" which wife says his meds have been adjusted. EMS had difficulty getting B

## 2019-06-30 NOTE — ED Notes (Signed)
Fecal occult negative, POC not working.

## 2019-07-01 ENCOUNTER — Inpatient Hospital Stay (HOSPITAL_COMMUNITY): Payer: Medicare Other

## 2019-07-01 ENCOUNTER — Inpatient Hospital Stay: Payer: Self-pay

## 2019-07-01 DIAGNOSIS — J189 Pneumonia, unspecified organism: Secondary | ICD-10-CM

## 2019-07-01 DIAGNOSIS — I2510 Atherosclerotic heart disease of native coronary artery without angina pectoris: Secondary | ICD-10-CM

## 2019-07-01 DIAGNOSIS — A419 Sepsis, unspecified organism: Secondary | ICD-10-CM | POA: Diagnosis present

## 2019-07-01 DIAGNOSIS — G8929 Other chronic pain: Secondary | ICD-10-CM

## 2019-07-01 DIAGNOSIS — I480 Paroxysmal atrial fibrillation: Secondary | ICD-10-CM

## 2019-07-01 DIAGNOSIS — N179 Acute kidney failure, unspecified: Secondary | ICD-10-CM

## 2019-07-01 LAB — RESPIRATORY PANEL BY PCR

## 2019-07-01 LAB — BASIC METABOLIC PANEL
Anion gap: 11 (ref 5–15)
BUN: 117 mg/dL — ABNORMAL HIGH (ref 8–23)
CO2: 16 mmol/L — ABNORMAL LOW (ref 22–32)
Calcium: 7.9 mg/dL — ABNORMAL LOW (ref 8.9–10.3)
Chloride: 109 mmol/L (ref 98–111)
Creatinine, Ser: 1.7 mg/dL — ABNORMAL HIGH (ref 0.61–1.24)
GFR calc Af Amer: 44 mL/min — ABNORMAL LOW (ref >60)
GFR calc non Af Amer: 38 mL/min — ABNORMAL LOW (ref >60)
Glucose, Bld: 160 mg/dL — ABNORMAL HIGH (ref 70–99)
Potassium: 5 mmol/L (ref 3.5–5.1)
Sodium: 136 mmol/L (ref 135–145)

## 2019-07-01 LAB — CBC
HCT: 32.7 % — ABNORMAL LOW (ref 39.0–52.0)
HCT: 36.4 % — ABNORMAL LOW (ref 39.0–52.0)
Hemoglobin: 10.4 g/dL — ABNORMAL LOW (ref 13.0–17.0)
Hemoglobin: 11.7 g/dL — ABNORMAL LOW (ref 13.0–17.0)
MCH: 30.6 pg (ref 26.0–34.0)
MCH: 30.4 pg (ref 26.0–34.0)
MCHC: 31.8 g/dL (ref 30.0–36.0)
MCHC: 32.1 g/dL (ref 30.0–36.0)
MCV: 96.2 fL (ref 80.0–100.0)
MCV: 94.5 fL (ref 80.0–100.0)
Platelets: 264 10*3/uL (ref 150–400)
Platelets: 292 10*3/uL (ref 150–400)
RBC: 3.4 MIL/uL — ABNORMAL LOW (ref 4.22–5.81)
RBC: 3.85 MIL/uL — ABNORMAL LOW (ref 4.22–5.81)
RDW: 16.4 % — ABNORMAL HIGH (ref 11.5–15.5)
RDW: 16.2 % — ABNORMAL HIGH (ref 11.5–15.5)
WBC: 22.1 10*3/uL — ABNORMAL HIGH (ref 4.0–10.5)
WBC: 22.7 10*3/uL — ABNORMAL HIGH (ref 4.0–10.5)
nRBC: 0.1 % (ref 0.0–0.2)
nRBC: 0.1 % (ref 0.0–0.2)

## 2019-07-01 LAB — HEMOGLOBIN A1C
Hgb A1c MFr Bld: 6.2 % — ABNORMAL HIGH (ref 4.8–5.6)
Mean Plasma Glucose: 131.24 mg/dL

## 2019-07-01 LAB — PROCALCITONIN: Procalcitonin: 0.26 ng/mL

## 2019-07-01 LAB — GLUCOSE, CAPILLARY: Glucose-Capillary: 164 mg/dL — ABNORMAL HIGH (ref 70–99)

## 2019-07-01 LAB — STREP PNEUMONIAE URINARY ANTIGEN: Strep Pneumo Urinary Antigen: NEGATIVE

## 2019-07-01 LAB — SARS CORONAVIRUS 2 (TAT 6-24 HRS): SARS Coronavirus 2: NEGATIVE

## 2019-07-01 LAB — RAPID URINE DRUG SCREEN, HOSP PERFORMED
Amphetamines: NOT DETECTED
Barbiturates: NOT DETECTED
Benzodiazepines: NOT DETECTED
Cocaine: NOT DETECTED
Opiates: POSITIVE — AB
Tetrahydrocannabinol: NOT DETECTED

## 2019-07-01 LAB — HIV ANTIBODY (ROUTINE TESTING W REFLEX): HIV Screen 4th Generation wRfx: NONREACTIVE

## 2019-07-01 LAB — MRSA PCR SCREENING: MRSA by PCR: POSITIVE — AB

## 2019-07-01 MED ORDER — SODIUM CHLORIDE 0.9 % IV SOLN
INTRAVENOUS | Status: DC | PRN
  Administered 2019-07-01 (×2): 250 mL via INTRAVENOUS

## 2019-07-01 MED ORDER — SODIUM CHLORIDE 0.9 % IV SOLN
INTRAVENOUS | Status: DC
  Administered 2019-07-01 (×4): via INTRAVENOUS

## 2019-07-01 MED ORDER — VANCOMYCIN HCL 1.25 G IV SOLR
1250.0000 mg | INTRAVENOUS | Status: DC
  Administered 2019-07-01: 1250 mg via INTRAVENOUS
  Filled 2019-07-01 (×2): qty 1250

## 2019-07-01 MED ORDER — SODIUM CHLORIDE 0.9 % IV BOLUS
1000.0000 mL | Freq: Once | INTRAVENOUS | Status: AC
  Administered 2019-07-01: 1000 mL via INTRAVENOUS

## 2019-07-01 MED ORDER — FENTANYL CITRATE (PF) 100 MCG/2ML IJ SOLN: 25.0000 ug | INTRAMUSCULAR | Status: DC | PRN

## 2019-07-01 MED ORDER — IOHEXOL 300 MG/ML  SOLN
80.0000 mL | Freq: Once | INTRAMUSCULAR | Status: AC | PRN
  Administered 2019-07-01: 80 mL via INTRAVENOUS

## 2019-07-01 MED ORDER — VANCOMYCIN HCL 10 G IV SOLR
1250.0000 mg | INTRAVENOUS | Status: DC
  Filled 2019-07-01: qty 1250

## 2019-07-01 MED ORDER — SODIUM CHLORIDE 0.9 % IV BOLUS
500.0000 mL | Freq: Once | INTRAVENOUS | Status: AC
  Administered 2019-07-01: 500 mL via INTRAVENOUS

## 2019-07-01 MED ORDER — FLEET ENEMA 7-19 GM/118ML RE ENEM
1.0000 | ENEMA | Freq: Once | RECTAL | Status: AC
  Administered 2019-07-01: 1 via RECTAL

## 2019-07-01 MED ORDER — MUPIROCIN 2 % EX OINT
TOPICAL_OINTMENT | Freq: Two times a day (BID) | CUTANEOUS | Status: DC
  Administered 2019-07-01: 21:00:00 via NASAL
  Administered 2019-07-01: 1 via NASAL
  Filled 2019-07-01: qty 22

## 2019-07-01 MED ORDER — SODIUM CHLORIDE 0.9 % IV BOLUS: 500.0000 mL | Freq: Once | INTRAVENOUS | Status: DC

## 2019-07-01 MED ORDER — ATROPINE SULFATE 1 MG/10ML IJ SOSY
PREFILLED_SYRINGE | INTRAMUSCULAR | Status: AC
  Filled 2019-07-01: qty 10

## 2019-07-01 MED ORDER — ORAL CARE MOUTH RINSE
15.0000 mL | Freq: Two times a day (BID) | OROMUCOSAL | Status: DC
  Administered 2019-07-01 (×2): 15 mL via OROMUCOSAL

## 2019-07-01 MED ORDER — SORBITOL 70 % SOLN
960.0000 mL | TOPICAL_OIL | Freq: Once | ORAL | Status: AC
  Administered 2019-07-01: 960 mL via RECTAL
  Filled 2019-07-01: qty 473

## 2019-07-01 MED ORDER — NOREPINEPHRINE 4 MG/250ML-% IV SOLN
0.0000 ug/min | INTRAVENOUS | Status: DC
  Administered 2019-07-01: 12 ug/min via INTRAVENOUS
  Administered 2019-07-01 (×2): 4 ug/min via INTRAVENOUS
  Administered 2019-07-02: 40 ug/min via INTRAVENOUS
  Filled 2019-07-01 (×2): qty 250
  Filled 2019-07-01: qty 500

## 2019-07-01 MED ORDER — NOREPINEPHRINE 4 MG/250ML-% IV SOLN
INTRAVENOUS | Status: AC
  Filled 2019-07-01: qty 250

## 2019-07-01 NOTE — Significant Event (Signed)
07/01/2019 12:12 PM   Pt complaining of left upper and lower quadrant pain, exam is benign, patient reports pain is severe.  Pt remains hypotensive on levophed, PICC line ordered.  CT abdomen ordered STAT, bentyl ordered and IV fentanyl ordered for pain symptoms.  Will follow up CT scan.    Murvin Natal MD

## 2019-07-01 NOTE — Progress Notes (Signed)
Dr. Darrick Meigs notified of current VS and urinary output of 13ml in last 4 hours. 271ml emptied at 0200. Temp 99, HR 70, b/p 101/74, MAP 82 with Levophed on 82mcg.

## 2019-07-01 NOTE — Progress Notes (Signed)
Dr. Darrick Meigs notified of patient's current vital signs and current IV therapy as well as temporary dip of HR into 30s.

## 2019-07-01 NOTE — Progress Notes (Signed)
Spoke with Rn re PICC order.  States CVW is at bedside now.

## 2019-07-01 NOTE — Progress Notes (Signed)
Family contacted at [phone] family in route

## 2019-07-01 NOTE — Progress Notes (Signed)
Warming blanket placed back on PT due to temperature via foley 95.6.

## 2019-07-01 NOTE — Progress Notes (Signed)
Jeannette Corpus, NP notified B/P 73/52 MAP 59. HR 69, R21, SpO2 98 on room air.

## 2019-07-01 NOTE — Progress Notes (Signed)
Verbal order from [place] (IV/PICC with vascular wellness) ok to use PICC line.

## 2019-07-01 NOTE — ED Provider Notes (Addendum)
Responded to CODE BLUE in  ICU approximately 11:25 PM.  Patient was undergoing CPR, his rhythm was asystole.  CPR was continued.  I intubated the patient via glide scope, the #3 blade malfunctioned (the video went out whenever I put it in the patient's mouth), and I used a #4 blade and was able to visualize the cords and patient was intubated with a 7.5 ET tube.  He had positive color change and equal breath sounds.  During the course of CPR he was noted to develop rattling when he was ventilated and at one point had some bloody foamy fluid in the ET tube.  He received 6 epinephrine, 2 atropine, 1 calcium, and 1 bicarb.  At approximately 11:45 PM patient had a few wide-complexes on the monitor and then he developed a regular rhythm in the 80s with a carotid pulse.  His Levophed was continued.  Patient was turned over to Dr. Darrick Meigs, hospitalist.   Rolland Porter, MD 07/01/19 2356    Dg Chest Port 1 View  Result Date: Jul 22, 2019 CLINICAL DATA:  Post CPR, NG tube and endotracheal tube placement EXAM: PORTABLE CHEST 1 VIEW COMPARISON:  07/01/2019 FINDINGS: Endotracheal tube is approximately 15 mm above the carina. NG tube appears to be looped in the cervical region. Cardiomegaly, vascular congestion. Bilateral airspace disease and layering effusions. Right PICC line remains in place, unchanged. IMPRESSION: Endotracheal tube tip 15 mm above the carina. NG tube appears to be located in the cervical region, likely coiling in the neck. This likely is to be completely removed and replaced. Diffuse airspace disease and layering effusions. Electronically Signed   By: Rolm Baptise M.D.   On: 07-22-2019 00:31         Rolland Porter, MD 07-22-2019 [phone]

## 2019-07-01 NOTE — Progress Notes (Signed)
Dr. Darrick Meigs in to see patient.

## 2019-07-01 NOTE — Progress Notes (Signed)
Patient continues to be hypotensive and bradycardiac. Hospitalist paged to come to floor and see patient.

## 2019-07-01 NOTE — Progress Notes (Addendum)
07/01/2019 5:37 PM  CT scan results have been reviewed.  Pt has a large fecal impaction.  He also has a large pericardial effusion.  Will order for echocardiogram and cardiology consult.  Enemas ordered for the fecal impaction.  He also appears to possibly have acute cholecystitis.  Will make him NPO. Order RUQ abdominal Ultrasound.  He is too unstable for surgical consideration at this time.  Will ask surgery for their opinion.  Continue IV antibiotics and supportive care and IV fluids and hold xarelto (received last dose this AM).   Continue to monitor closely in ICU.  I spoke with wife at bedside and explained how ill the patient was and the serious problems that we have identified and his high risk of mortality. She verbalized understanding.  I asked about his wishes and if they had discussed what he would want if he becomes incapacitated. She says that he would not want prolonged intubation but would want CPR and short-term intubation only.    Murvin Natal MD  How to contact the [facility] Attending or Consulting provider [place] or covering provider during after hours [place], for this patient?  1. Check the care team in [facility] and look for a) attending/consulting [facility] provider listed and b) the [facility] team listed 2. Log into [email or web address] and use [place]'s universal password to access. If you do not have the password, please contact the hospital operator. 3. Locate the [facility] provider you are looking for under [facility] and page to a number that you can be directly reached. 4. If you still have difficulty reaching the provider, please page the [facility] (Director on Call) for the Hospitalists listed on amion for assistance.

## 2019-07-01 NOTE — Progress Notes (Signed)
Dr Wynetta Emery made aware of urine output throughout the day. Bolus's given per orders and Dr. Wynetta Emery made aware of low urine output after each bolus.

## 2019-07-01 NOTE — Progress Notes (Addendum)
PROGRESS NOTE [facility]   MARKEY FELKINS  [id number]  DOB: 1941/06/09  DOA: 06/29/2019 PCP: Glenda Chroman, MD   Brief Admission Hx: 78 year old male with A. fib/a flutter on Xarelto, history of PE and DVT, CAD with stent placement, BPH and COPD hypertension GERD and parkinsonian presented with near syncope.  He was found to have severe sepsis and pneumonia and admitted to the stepdown ICU.  MDM/Assessment & Plan:   1. Severe sepsis with septic shock-lactate is trending down with supportive therapy, continue broad-spectrum antibiotics therapy and continue pressors.  I have increased IV fluids and will try to wean Levophed to off today.  Hold antihypertensives until BP recovers.  Continue other measures.  Pt is critically ill with high mortality risk, multiple recent hospitalizations, will request palliative medicine consult for assistance with goals of care.  2. Hypothermia-this was treated aggressively with a bair hugger.  Temperature has improved considerably. 3. Healthcare associated pneumonia - pt was hospitalized last month and is being treated for HCAP.  Continue current IV antibiotics. Follow clinical course.  4. AKI - Pt appears dehydrated and IV fluids have been increased.  Follow creatinine.  5. Paroxysmal atrial fibrillation - patient presented with persistent bradycardia and hypotension requiring dose of atropine, holding blood pressure meds for now, restart slowly and follow, may need to reduce doses of some of his heart rate controlling medications.  6. Hypertension - holding home meds in setting of hypotension.  7. Hyperglycemia - checking A1c to screen for DM, Insulin for BS>180.   8. History of DVT/PE - xarelto ordered.  9. GERD - protonix ordered for GI protection.   DVT prophylaxis: xarelto  Code Status: full  Family Communication: wife Disposition Plan: continue stepdown ICU care   Consultants:  Palliative medicine  Procedures:    Antimicrobials:   Cefepime 11/14>  Vancomycin 11/14>   metronidazole 11/14>  Subjective: Pt says he feels really tired today. No chest pain and no SOB.   Objective: Vitals:   07/01/19 0715 07/01/19 0730 07/01/19 0745 07/01/19 0830  BP: 98/70 99/68 106/74 99/66  Pulse: 71 73 74 73  Resp: (!) 22 (!) 23 (!) 22 (!) 26  Temp: 99.1 F (37.3 C) 99.1 F (37.3 C) 99.2 F (37.3 C) 99.2 F (37.3 C)  TempSrc:  Bladder    SpO2: 100% 100% 100% 100%  Weight:      Height:        Intake/Output Summary (Last 24 hours) at 07/01/2019 [id number] Last data filed at 07/01/2019 [id number] Gross per 24 hour  Intake 3498.48 ml  Output 335 ml  Net 3163.48 ml   Filed Weights   07/14/2019 1820 06/23/2019 2300  Weight: 116.2 kg 109.5 kg   REVIEW OF SYSTEMS  As per history otherwise all reviewed and reported negative  Exam:  General exam: chronically ill appearing awake, alert, NAD, vocalizing and mentating normally.  Respiratory system:  No increased work of breathing. Cardiovascular system: S1 & S2 heard.  Gastrointestinal system: Abdomen is nondistended, soft and nontender. Normal bowel sounds heard. Central nervous system: Alert and oriented. No focal neurological deficits. Extremities: 1+BLE   Data Reviewed: Basic Metabolic Panel: Recent Labs  Lab 07/06/2019 1910 07/01/19 0527  NA 134* 136  K 5.0 5.0  CL 111 109  CO2 17* 16*  GLUCOSE 181* 160*  BUN 105* 117*  CREATININE 1.68* 1.70*  CALCIUM 7.8* 7.9*   Liver Function Tests: Recent Labs  Lab 06/19/2019 1910  AST 13*  ALT 6  ALKPHOS 46  BILITOT 0.7  PROT 6.0*  ALBUMIN 2.4*   No results for input(s): LIPASE, AMYLASE in the last 168 hours. No results for input(s): AMMONIA in the last 168 hours. CBC: Recent Labs  Lab 06/17/2019 1910 07/01/19 0135 07/01/19 0527  WBC 19.9* 22.1* 22.7*  NEUTROABS 15.8*  --   --   HGB 10.5* 10.4* 11.7*  HCT 34.2* 32.7* 36.4*  MCV 97.7 96.2 94.5  PLT 262 264 292   Cardiac Enzymes: No results for input(s): CKTOTAL,  CKMB, CKMBINDEX, TROPONINI in the last 168 hours. CBG (last 3)  Recent Labs    06/19/2019 1826 07/01/19 0025  GLUCAP 192* 164*   Recent Results (from the past 240 hour(s))  Blood Culture (routine x 2)     Status: None (Preliminary result)   Collection Time: 07/05/2019  7:10 PM   Specimen: BLOOD RIGHT HAND  Result Value Ref Range Status   Specimen Description BLOOD RIGHT HAND  Final   Special Requests   Final    BOTTLES DRAWN AEROBIC ONLY Blood Culture adequate volume   Culture   Final    NO GROWTH < 24 HOURS Performed at [facility], [address] [place], [place] [address]    Report Status PENDING  Incomplete  Blood Culture (routine x 2)     Status: None (Preliminary result)   Collection Time: 06/23/2019  7:25 PM   Specimen: BLOOD RIGHT WRIST  Result Value Ref Range Status   Specimen Description BLOOD RIGHT WRIST  Final   Special Requests   Final    BOTTLES DRAWN AEROBIC ONLY Blood Culture adequate volume   Culture   Final    NO GROWTH < 24 HOURS Performed at [facility], [address] [place], [place] [address]    Report Status PENDING  Incomplete  MRSA PCR Screening     Status: Abnormal   Collection Time: 07/16/2019 10:34 PM   Specimen: Nasopharyngeal  Result Value Ref Range Status   MRSA by PCR POSITIVE (A) NEGATIVE Final    Comment:        The GeneXpert MRSA Assay (FDA approved for NASAL specimens only), is one component of a comprehensive MRSA colonization surveillance program. It is not intended to diagnose MRSA infection nor to guide or monitor treatment for MRSA infections. RESULT CALLED TO, READ BACK BY AND VERIFIED WITH: HYLTON L. @ [id number] ON [id number] BY HENDERSON L. Performed at [facility], [address], [place], [place] [address]      Studies: Dg Chest [facility] 1 View  Result Date: 07/03/2019 CLINICAL DATA:  Hypotension EXAM: PORTABLE CHEST 1 VIEW COMPARISON:  06/04/2019 FINDINGS: Cardiomegaly with vascular congestion. Large hiatal hernia. Left lower  lobe atelectasis or infiltrate. No overt edema or definite significant effusions. No acute bony abnormality. IMPRESSION: Cardiomegaly.  Left lower lobe atelectasis or infiltrate. Large hiatal hernia. Electronically Signed   By: Rolm Baptise M.D.   On: 07/10/2019 19:25   Scheduled Meds: . amantadine  100 mg Oral Daily  . atorvastatin  80 mg Oral QPM  . atropine      . carbidopa-levodopa  1 tablet Oral BID  . Chlorhexidine Gluconate Cloth  6 each Topical Q0600  . diltiazem  120 mg Oral Daily  . DULoxetine  30 mg Oral Daily  . [START ON 08-01-2019] fenofibrate  160 mg Oral Daily  . finasteride  5 mg Oral Daily  . influenza vaccine adjuvanted  0.5 mL Intramuscular Tomorrow-1000  . insulin aspart  0-15 Units Subcutaneous  TID WC  . insulin aspart  0-5 Units Subcutaneous QHS  . mouth rinse  15 mL Mouth Rinse BID  . pantoprazole  40 mg Oral BID  . rivaroxaban  20 mg Oral Daily  . senna  1 tablet Oral Daily  . tamsulosin  0.4 mg Oral QPC supper  . umeclidinium-vilanterol  1 puff Inhalation Daily   Continuous Infusions: . sodium chloride 150 mL/hr at 07/01/19 0742  . sodium chloride 10 mL/hr at 07/01/19 0742  . azithromycin Stopped (07/01/19 0148)  . ceFEPime (MAXIPIME) IV    . norepinephrine (LEVOPHED) Adult infusion 4 mcg/min (07/01/19 0742)  . vancomycin      Principal Problem:   Severe sepsis with septic shock (HCC) Active Problems:   Essential hypertension   Chronic pain   Atrial fibrillation (HCC)   Acute renal injury ([place])   COPD (chronic obstructive pulmonary disease) (HCC)   GERD (gastroesophageal reflux disease)   Hypertension   CAD (coronary artery disease)   History of pulmonary embolism   CAP (community acquired pneumonia)   Elevated blood sugar  Critical Care Procedure Note Authorized and Performed by: Murvin Natal MD  Total Critical Care time:  34 minutes  Due to a high probability of clinically significant, life threatening deterioration, the patient required my  highest level of preparedness to intervene emergently and I personally spent this critical care time directly and personally managing the patient.  This critical care time included obtaining a history; examining the patient, pulse oximetry; ordering and review of studies; arranging urgent treatment with development of a management plan; evaluation of patient's response of treatment; frequent reassessment; and discussions with other providers.  This critical care time was performed to assess and manage the high probability of imminent and life threatening deterioration that could result in multi-organ failure.  It was exclusive of separately billable procedures and treating other patients and teaching time.   Irwin Brakeman, MD [facility] 07/01/2019, 9:18 AM    LOS: 1 day  How to contact the [facility] Attending or Consulting provider [place] or covering provider during after hours [place], for this patient?  1. Check the care team in [facility] and look for a) attending/consulting TRH provider listed and b) the [facility] team listed 2. Log into [email or web address] and use [place]'s universal password to access. If you do not have the password, please contact the hospital operator. 3. Locate the [facility] provider you are looking for under [facility] and page to a number that you can be directly reached. 4. If you still have difficulty reaching the provider, please page the [facility] (Director on Call) for the Hospitalists listed on amion for assistance.

## 2019-07-02 ENCOUNTER — Inpatient Hospital Stay (HOSPITAL_COMMUNITY): Payer: Medicare Other

## 2019-07-02 ENCOUNTER — Other Ambulatory Visit (HOSPITAL_COMMUNITY): Payer: TRICARE For Life (TFL)

## 2019-07-02 DIAGNOSIS — R6520 Severe sepsis without septic shock: Secondary | ICD-10-CM

## 2019-07-02 DIAGNOSIS — I10 Essential (primary) hypertension: Secondary | ICD-10-CM

## 2019-07-02 DIAGNOSIS — K219 Gastro-esophageal reflux disease without esophagitis: Secondary | ICD-10-CM

## 2019-07-02 DIAGNOSIS — A419 Sepsis, unspecified organism: Principal | ICD-10-CM

## 2019-07-02 DIAGNOSIS — Z515 Encounter for palliative care: Secondary | ICD-10-CM

## 2019-07-02 DIAGNOSIS — R739 Hyperglycemia, unspecified: Secondary | ICD-10-CM

## 2019-07-02 DIAGNOSIS — J449 Chronic obstructive pulmonary disease, unspecified: Secondary | ICD-10-CM

## 2019-07-02 DIAGNOSIS — K819 Cholecystitis, unspecified: Secondary | ICD-10-CM

## 2019-07-02 DIAGNOSIS — R6521 Severe sepsis with septic shock: Secondary | ICD-10-CM

## 2019-07-02 LAB — BRAIN NATRIURETIC PEPTIDE: B Natriuretic Peptide: 64 pg/mL (ref 0.0–100.0)

## 2019-07-02 LAB — BLOOD GAS, ARTERIAL
Acid-base deficit: 12.5 mmol/L — ABNORMAL HIGH (ref 0.0–2.0)
Acid-base deficit: 20.8 mmol/L — ABNORMAL HIGH (ref 0.0–2.0)
Acid-base deficit: 21 mmol/L — ABNORMAL HIGH (ref 0.0–2.0)
Bicarbonate: 15 mmol/L — ABNORMAL LOW (ref 20.0–28.0)
Bicarbonate: 8.1 mmol/L — ABNORMAL LOW (ref 20.0–28.0)
Bicarbonate: 9.1 mmol/L — ABNORMAL LOW (ref 20.0–28.0)
FIO2: 100
FIO2: 100
FIO2: 100
O2 Saturation: 84.8 %
O2 Saturation: 97.1 %
O2 Saturation: 98.4 %
Patient temperature: 37
Patient temperature: 37
Patient temperature: 37
pCO2 arterial: 20 mmHg — ABNORMAL LOW (ref 32.0–48.0)
pCO2 arterial: 24.5 mmHg — ABNORMAL LOW (ref 32.0–48.0)
pCO2 arterial: 49.9 mmHg — ABNORMAL HIGH (ref 32.0–48.0)
pH, Arterial: 6.906 — CL (ref 7.350–7.450)
pH, Arterial: 7.135 — CL (ref 7.350–7.450)
pH, Arterial: 7.323 — ABNORMAL LOW (ref 7.350–7.450)
pO2, Arterial: 144 mmHg — ABNORMAL HIGH (ref 83.0–108.0)
pO2, Arterial: 149 mmHg — ABNORMAL HIGH (ref 83.0–108.0)
pO2, Arterial: 87.7 mmHg (ref 83.0–108.0)

## 2019-07-02 LAB — CBC WITH DIFFERENTIAL/PLATELET
Abs Immature Granulocytes: 1.32 10*3/uL — ABNORMAL HIGH (ref 0.00–0.07)
Basophils Absolute: 0.1 10*3/uL (ref 0.0–0.1)
Basophils Relative: 0 %
Eosinophils Absolute: 0 10*3/uL (ref 0.0–0.5)
Eosinophils Relative: 0 %
HCT: 30.4 % — ABNORMAL LOW (ref 39.0–52.0)
Hemoglobin: 9.4 g/dL — ABNORMAL LOW (ref 13.0–17.0)
Immature Granulocytes: 4 %
Lymphocytes Relative: 6 %
Lymphs Abs: 1.9 10*3/uL (ref 0.7–4.0)
MCH: 30.2 pg (ref 26.0–34.0)
MCHC: 30.9 g/dL (ref 30.0–36.0)
MCV: 97.7 fL (ref 80.0–100.0)
Monocytes Absolute: 2.5 10*3/uL — ABNORMAL HIGH (ref 0.1–1.0)
Monocytes Relative: 7 %
Neutro Abs: 28 10*3/uL — ABNORMAL HIGH (ref 1.7–7.7)
Neutrophils Relative %: 83 %
Platelets: 271 10*3/uL (ref 150–400)
RBC: 3.11 MIL/uL — ABNORMAL LOW (ref 4.22–5.81)
RDW: 16.4 % — ABNORMAL HIGH (ref 11.5–15.5)
WBC: 33.7 10*3/uL — ABNORMAL HIGH (ref 4.0–10.5)
nRBC: 0.3 % — ABNORMAL HIGH (ref 0.0–0.2)

## 2019-07-02 LAB — LACTIC ACID, PLASMA
Lactic Acid, Venous: 4.8 mmol/L (ref 0.5–1.9)
Lactic Acid, Venous: 8.5 mmol/L (ref 0.5–1.9)

## 2019-07-02 LAB — TROPONIN I (HIGH SENSITIVITY)
Troponin I (High Sensitivity): 32 ng/L — ABNORMAL HIGH (ref <18)
Troponin I (High Sensitivity): 43 ng/L — ABNORMAL HIGH (ref <18)

## 2019-07-02 LAB — COMPREHENSIVE METABOLIC PANEL
ALT: 5 U/L (ref 0–44)
ALT: 6 U/L (ref 0–44)
AST: 34 U/L (ref 15–41)
AST: 37 U/L (ref 15–41)
Albumin: 1.8 g/dL — ABNORMAL LOW (ref 3.5–5.0)
Albumin: 2.1 g/dL — ABNORMAL LOW (ref 3.5–5.0)
Alkaline Phosphatase: 49 U/L (ref 38–126)
Alkaline Phosphatase: 33 U/L — ABNORMAL LOW (ref 38–126)
Anion gap: 16 — ABNORMAL HIGH (ref 5–15)
Anion gap: 16 — ABNORMAL HIGH (ref 5–15)
BUN: 146 mg/dL — ABNORMAL HIGH (ref 8–23)
BUN: 99 mg/dL — ABNORMAL HIGH (ref 8–23)
CO2: 10 mmol/L — ABNORMAL LOW (ref 22–32)
CO2: 12 mmol/L — ABNORMAL LOW (ref 22–32)
Calcium: 8.2 mg/dL — ABNORMAL LOW (ref 8.9–10.3)
Calcium: 6.6 mg/dL — ABNORMAL LOW (ref 8.9–10.3)
Chloride: 112 mmol/L — ABNORMAL HIGH (ref 98–111)
Chloride: 114 mmol/L — ABNORMAL HIGH (ref 98–111)
Creatinine, Ser: 2.05 mg/dL — ABNORMAL HIGH (ref 0.61–1.24)
Creatinine, Ser: 1.73 mg/dL — ABNORMAL HIGH (ref 0.61–1.24)
GFR calc Af Amer: 35 mL/min — ABNORMAL LOW (ref >60)
GFR calc Af Amer: 43 mL/min — ABNORMAL LOW (ref >60)
GFR calc non Af Amer: 31 mL/min — ABNORMAL LOW (ref >60)
GFR calc non Af Amer: 38 mL/min — ABNORMAL LOW (ref >60)
Glucose, Bld: 205 mg/dL — ABNORMAL HIGH (ref 70–99)
Glucose, Bld: 139 mg/dL — ABNORMAL HIGH (ref 70–99)
Potassium: 6 mmol/L — ABNORMAL HIGH (ref 3.5–5.1)
Potassium: 4.7 mmol/L (ref 3.5–5.1)
Sodium: 138 mmol/L (ref 135–145)
Sodium: 142 mmol/L (ref 135–145)
Total Bilirubin: 0.8 mg/dL (ref 0.3–1.2)
Total Bilirubin: 1.1 mg/dL (ref 0.3–1.2)
Total Protein: 4.6 g/dL — ABNORMAL LOW (ref 6.5–8.1)
Total Protein: 4.2 g/dL — ABNORMAL LOW (ref 6.5–8.1)

## 2019-07-02 LAB — CBC
HCT: 34.1 % — ABNORMAL LOW (ref 39.0–52.0)
Hemoglobin: 10.3 g/dL — ABNORMAL LOW (ref 13.0–17.0)
MCH: 30.8 pg (ref 26.0–34.0)
MCHC: 30.2 g/dL (ref 30.0–36.0)
MCV: 102.1 fL — ABNORMAL HIGH (ref 80.0–100.0)
Platelets: 269 10*3/uL (ref 150–400)
RBC: 3.34 MIL/uL — ABNORMAL LOW (ref 4.22–5.81)
RDW: 16.9 % — ABNORMAL HIGH (ref 11.5–15.5)
WBC: 26.9 10*3/uL — ABNORMAL HIGH (ref 4.0–10.5)
nRBC: 0.5 % — ABNORMAL HIGH (ref 0.0–0.2)

## 2019-07-02 LAB — PROTIME-INR
INR: >10 (ref 0.8–1.2)
Prothrombin Time: >90 seconds — ABNORMAL HIGH (ref 11.4–15.2)

## 2019-07-02 LAB — PROCALCITONIN: Procalcitonin: 1.78 ng/mL

## 2019-07-02 LAB — MAGNESIUM: Magnesium: 1.8 mg/dL (ref 1.7–2.4)

## 2019-07-02 LAB — APTT: aPTT: 57 seconds — ABNORMAL HIGH (ref 24–36)

## 2019-07-02 MED ORDER — ALBUMIN HUMAN 25 % IV SOLN
INTRAVENOUS | Status: AC
  Administered 2019-07-02: 25 g
  Filled 2019-07-02: qty 50

## 2019-07-02 MED ORDER — SODIUM BICARBONATE 8.4 % IV SOLN
INTRAVENOUS | Status: AC
  Filled 2019-07-02: qty 150

## 2019-07-02 MED ORDER — SODIUM CHLORIDE 0.9 % IV BOLUS
500.0000 mL | Freq: Once | INTRAVENOUS | Status: AC
  Administered 2019-07-02: 500 mL via INTRAVENOUS

## 2019-07-02 MED ORDER — FENTANYL 2500MCG IN NS 250ML (10MCG/ML) PREMIX INFUSION
25.0000 ug/h | INTRAVENOUS | Status: DC
  Administered 2019-07-02: 25 ug/h via INTRAVENOUS
  Filled 2019-07-02: qty 250

## 2019-07-02 MED ORDER — PHENYLEPHRINE HCL (PRESSORS) 10 MG/ML IV SOLN
INTRAVENOUS | Status: AC
  Filled 2019-07-02: qty 4

## 2019-07-02 MED ORDER — SODIUM BICARBONATE 8.4 % IV SOLN
100.0000 meq | Freq: Once | INTRAVENOUS | Status: AC
  Administered 2019-07-02: 100 meq via INTRAVENOUS

## 2019-07-02 MED ORDER — SODIUM BICARBONATE-DEXTROSE 150-5 MEQ/L-% IV SOLN
150.0000 meq | INTRAVENOUS | Status: DC
  Administered 2019-07-02 (×2): 150 meq via INTRAVENOUS
  Filled 2019-07-02 (×4): qty 1000

## 2019-07-02 MED ORDER — CALCIUM GLUCONATE-NACL 1-0.675 GM/50ML-% IV SOLN
1.0000 g | Freq: Once | INTRAVENOUS | Status: AC
  Administered 2019-07-02: 1000 mg via INTRAVENOUS

## 2019-07-02 MED ORDER — SODIUM CHLORIDE 0.9 % IV SOLN
0.0000 ug/min | INTRAVENOUS | Status: DC
  Administered 2019-07-02: 400 ug/min via INTRAVENOUS
  Filled 2019-07-02: qty 4

## 2019-07-02 MED ORDER — EPINEPHRINE PF 1 MG/ML IJ SOLN
INTRAMUSCULAR | Status: AC
  Filled 2019-07-02: qty 4

## 2019-07-02 MED ORDER — ALBUMIN HUMAN 25 % IV SOLN: 25.0000 g | Freq: Once | INTRAVENOUS | Status: AC

## 2019-07-02 MED ORDER — HYDROCORTISONE NA SUCCINATE PF 100 MG IJ SOLR
50.0000 mg | Freq: Four times a day (QID) | INTRAMUSCULAR | Status: DC
  Administered 2019-07-02 (×2): 50 mg via INTRAVENOUS
  Filled 2019-07-02 (×2): qty 2

## 2019-07-02 MED ORDER — VASOPRESSIN 20 UNIT/ML IV SOLN
INTRAVENOUS | Status: AC
  Filled 2019-07-02: qty 2

## 2019-07-02 MED ORDER — ALBUMIN HUMAN 25 % IV SOLN
25.0000 g | Freq: Once | INTRAVENOUS | Status: AC
  Administered 2019-07-02: 25 g via INTRAVENOUS

## 2019-07-02 MED ORDER — SODIUM CHLORIDE 0.9 % IV SOLN: INTRAVENOUS | Status: DC | PRN

## 2019-07-02 MED ORDER — SODIUM BICARBONATE 8.4 % IV SOLN: INTRAVENOUS | Status: DC

## 2019-07-02 MED ORDER — EPINEPHRINE HCL 5 MG/250ML IV SOLN IN NS
0.5000 ug/min | INTRAVENOUS | Status: DC
  Administered 2019-07-02: 1 ug/min via INTRAVENOUS
  Filled 2019-07-02 (×2): qty 250

## 2019-07-02 MED ORDER — VASOPRESSIN 20 UNIT/ML IV SOLN
0.0300 [IU]/min | INTRAVENOUS | Status: DC
  Administered 2019-07-02: 0.03 [IU]/min via INTRAVENOUS
  Filled 2019-07-02 (×2): qty 2

## 2019-07-02 MED ORDER — CALCIUM GLUCONATE-NACL 1-0.675 GM/50ML-% IV SOLN
INTRAVENOUS | Status: AC
  Filled 2019-07-02: qty 50

## 2019-07-02 MED ORDER — PHENYLEPHRINE HCL-NACL 10-0.9 MG/250ML-% IV SOLN
0.0000 ug/min | INTRAVENOUS | Status: DC
  Administered 2019-07-02: 20 ug/min via INTRAVENOUS
  Administered 2019-07-02: 80 ug/min via INTRAVENOUS
  Filled 2019-07-02 (×2): qty 250
  Filled 2019-07-02: qty 500
  Filled 2019-07-02: qty 250

## 2019-07-02 MED ORDER — SODIUM BICARBONATE 8.4 % IV SOLN
INTRAVENOUS | Status: AC
  Filled 2019-07-02: qty 100

## 2019-07-02 MED ORDER — NOREPINEPHRINE 16 MG/250ML-% IV SOLN
0.0000 ug/min | INTRAVENOUS | Status: DC
  Administered 2019-07-02 (×2): 40 ug/min via INTRAVENOUS
  Filled 2019-07-02 (×3): qty 250

## 2019-07-02 MED FILL — Medication: Qty: 1 | Status: AC

## 2019-07-03 LAB — GLUCOSE, CAPILLARY
Glucose-Capillary: 126 mg/dL — ABNORMAL HIGH (ref 70–99)
Glucose-Capillary: 165 mg/dL — ABNORMAL HIGH (ref 70–99)
Glucose-Capillary: 177 mg/dL — ABNORMAL HIGH (ref 70–99)
Glucose-Capillary: 182 mg/dL — ABNORMAL HIGH (ref 70–99)
Glucose-Capillary: 191 mg/dL — ABNORMAL HIGH (ref 70–99)
Glucose-Capillary: 173 mg/dL — ABNORMAL HIGH (ref 70–99)

## 2019-07-03 LAB — URINE CULTURE: Culture: 10000 — AB

## 2019-07-05 LAB — CULTURE, BLOOD (ROUTINE X 2)
Culture: NO GROWTH
Culture: NO GROWTH
Special Requests: ADEQUATE
Special Requests: ADEQUATE

## 2019-07-17 NOTE — Progress Notes (Signed)
Critical Lactic Acid 4.8 called at 0235 Jul 16, 2019. [place] notified.

## 2019-07-17 NOTE — Progress Notes (Signed)
[facility]  Reviewed chart. Coded last night and intubate. Septic with concern for cholecystitis on CT yesterday. Inr 10 now, CBC 33, minor LFT elevations.   Would give broad spectrum antibiotics. Not surgical candidate. Would confirm cholecystitis with [place] versus HIDA and maybe need cholecystomy tube but will have to have INR normalized which may not be possible given code etc.  Poor prognosis. Will  be up to see shortly.  Curlene Labrum, MD

## 2019-07-17 NOTE — Progress Notes (Signed)
Patient noted to experience bradycardia, rate of 23 at 2323 and deteriorated to asystole despite atropine and code blue called.  CPR started immediately and Dr. Tomi Bamberger in to intubate and lead the beginning resuscitation effort.  E-link contacted as well as Dr. Darrick Meigs who responded to the code.  Medications given as ordered (see the resuscitation record). Patient placed on ventilator by Respiratory Therapy.  Ph 6.96 reported to Dr. Darrick Meigs as soon as result was received.   Lactic Acid 8.5 also reported as soon as result received.  Patient resuscitated and in atrial fib RVR with BBB.  Remains hypotensive on Levophed, vasopressin, and phenylephrine drips. Family in twice at bedside and were updated by Dr. Darrick Meigs and [place].

## 2019-07-17 NOTE — Discharge Summary (Addendum)
Expiration Note  RAMADAN COUEY  MR#: [id number]  DOB:1941/08/04  Date of Admission: Jul 15, 2019 Date of Death: 07-17-2019  Attending [place]  Patient's PCP: Glenda Chroman, MD  Consults: Treatment Team:  Virl Cagey, MD Sinda Du, MD  Cause of Death: Severe sepsis with septic shock [facility])  Secondary Diagnoses Present on Admission:  Severe sepsis with septic shock ([place])  CAP (community acquired pneumonia)  Atrial fibrillation ([place])  CAD (coronary artery disease)  History of pulmonary embolism  GERD (gastroesophageal reflux disease)  Hypertension  Acute renal injury ([place])  COPD (chronic obstructive pulmonary disease) ([place])  Essential hypertension  Elevated blood sugar  Chronic pain  Severe sepsis ([place])    CKD stage IIIb  Hospital Course: Dr.Stinson's HPI: Bryce Berry is a 78 y.o. male with a history of A. fib/a flutter on Xarelto, history of PE and DVT, coronary artery disease with stents, BPH, COPD, hypertension, GERD, hyperlipidemia.  Patient seen for sudden onset of weakness at dinner with presyncopal symptoms.  No syncopal episode occurred but he became very diaphoretic.  EMS was called and the patient was found to be hypotensive on their arrival.  Patient was brought to the emergency department where his initial blood pressure was 76/42 and bradycardic.  Patient received atropine and IV fluids with good response to his blood pressure.  No palliating or provoking factors.  Symptoms are now improving with fluid resuscitation and atropine.  Patient does have baseline cough and appears to be mildly hypothermic.   Emergency Department Course: Patient received broad-spectrum antibiotics: Vanco, cefepime, and Flagyl.  He was fluid resuscitated with 2 L of fluids.  Chest x-ray shows left lower lobe infiltrates or atelectasis.  White count 19. Cr 1.6   Brief Admission Hx: 78 year old male with A. fib/a flutter on Xarelto, history of PE and DVT,  CAD with stent placement, BPH and COPD hypertension GERD and parkinsonian presented with near syncope.  He was found to have severe sepsis and pneumonia and admitted to the stepdown ICU and started on pressor support.   MDM/Assessment & Plan:   Severe sepsis with septic shock-code blue was called early morning 07-17-19 and patient developed a PEA arrest and found to be in asystole, pt was intubated and his hypotension worsened and he required additional pressor support. The Critical care providers added bicarb infusion, he was maxed out on levo and additional pressor support and with maximal support he remained very hypotensive and had no meaningful improvement. I spoke with Dr Luan Pulling with pulmonology and there was nothing further to add, patient not a candidate for any invasive procedures, I spoke with surgery.  I met with family along with the palliative care team. Pt expired at 1020.  Family was at the bedside, chaplain attended to family, death certificate was completed.       Hypothermia-this was treated aggressively with a bair hugger.  Temperature has improved considerably. Healthcare associated pneumonia - pt was hospitalized last month and was treated for HCAP.  AKI  Paroxysmal atrial fibrillation     Consultants: Palliative medicine   Procedures:     Antimicrobials: Cefepime 11/14> Vancomycin 07/15/23> metronidazole 11/14> Azithromycin 07-15-23 >   Signed: Merl Bommarito 07/17/2019, 3:24 PM  How to contact the [facility] Attending or Consulting provider [place] or covering provider during after hours [place], for this patient?  Check the care team in [facility] and look for a) attending/consulting TRH provider listed and b) the [facility] team listed Log into [email or web address] and  use [place]'s universal password to access. If you do not have the password, please contact the hospital operator. Locate the [facility] provider you are looking for under [facility] and page to a number that you can be  directly reached. If you still have difficulty reaching the provider, please page the [facility] (Director on Call) for the Hospitalists listed on amion for assistance.

## 2019-07-17 NOTE — Progress Notes (Signed)
Spoke with Bryce Berry with [employer]. Reference OF:[id number]. Pt is not a candidate for donation.

## 2019-07-17 NOTE — Procedures (Signed)
Arterial Catheter Insertion Procedure Note WISDOM CHAVIRA [id number] November 30, 1940  Procedure: Insertion of Arterial Catheter  Indications: Blood pressure monitoring and Frequent blood sampling  Procedure Details Consent: Unable to obtain consent because of emergent medical necessity. Time Out: Verified patient identification, verified procedure, site/side was marked, verified correct patient position, special equipment/implants available, medications/allergies/relevent history reviewed, required imaging and test results available.  Performed  Maximum sterile technique was used including antiseptics, cap, gloves, gown, hand hygiene, mask and sheet. Skin prep: Chlorhexidine; local anesthetic administered 20 gauge catheter was inserted into right radial artery using the Seldinger technique. ULTRASOUND GUIDANCE USED: NO Evaluation Blood flow good; BP tracing good. Complications: No apparent complications.   Eliane Decree July 07, 2019

## 2019-07-17 NOTE — Progress Notes (Signed)
Jul 27, 2019  10 AM  HR noted to drop to 30's 40's and was irregular. Verbal order given to administer 1 mg atropine. Wife and niece arrived at bedside with Dr. Wynetta Emery and Vinie Sill, NP both present as well. Family aware of pt's condition and accepting his decline. Pt passed at 20 with family and staff at bedside.

## 2019-07-17 NOTE — Progress Notes (Signed)
Dr. Prudencio Burly called unit to notify patient would not be transferred until he is more stable.  Family telephone number given to Dr. Prudencio Burly so he could call and update them (wife).

## 2019-07-17 NOTE — Progress Notes (Addendum)
[place] Progress Note Patient Name: Bryce Berry DOB: [birth year] MRN: [id number]   Date of Service  2019/07/05  HPI/Events of Note  PH 7.13, hco3 at 19. Art line MAP low. maxed out on Levo quadruple strength. Vaso and getting max on Neo gtt also soon.  eICU Interventions  - Sodium bicarb 100 stat and albumin 25 gm bolus. - guarded prognosis. - HR 96. K 4.7. Cr improving, but not making urine.  - wbc up. Stable Hg.  - do not see any electrical alternans suggestive of tamponade.  DP PP variationon art line +. Do not have NICOM or flow track.  -  BNP low ,  CxR effusion/chf - NS 500  Ml bolus. Do not have ARB G[place]za.      Intervention Category Intermediate Interventions: Hypotension - evaluation and management  Elmer Sow 2019/07/05, 6:24 AM   6:47 AM Continued camera : Tried dose of epi IVP with SBP up to 130. Start epi gtt and wean down Levophed.   6:55 AM Elevated PTT/INR. Hg ok. No bleeding signs. Continue to monitor.   Dr Darrick Meigs is notified of with holding transfer and MOF and on 4 pressors.

## 2019-07-17 NOTE — Progress Notes (Signed)
Pt currently maxed out on three pressor and BP continuously maintaining low with MAPs in the 40's and 50's. Wife called this morning to update and told to come on in. Dr. Wynetta Emery notified and Vinie Sill, palliative NP paged to see pt first this morning.

## 2019-07-17 NOTE — Progress Notes (Signed)
Present with Mr. Seidner' family, especially his wife, Enid Derry after his death.

## 2019-07-17 NOTE — Progress Notes (Addendum)
[place] Progress Note Patient Name: Bryce Berry DOB: 29-Jun-1941 MRN: [id number]   Date of Service  07-15-2019  HPI/Events of Note  Asked to go in to room for severe AGMA. On Vent 100%. Just seen by bed side MD. On bicarb.  MAP low. maxed out on levo.  Discussed with bed side RN. sats ok. P peak 24. 650/5/22. In synchrony.   A/P: 1. Septic shock, increasing LA 8. CT abdomen: suspected cholecystitis. 2. Large pericardial effusion.   eICU Interventions  - stat Vasopressin/albumin/sodium bicarb 100 meq ordered - get CxR to see any free air under diaphragm. - follow LA and ABG. - follow UOP.      Intervention Category Major Interventions: Shock - evaluation and management  Elmer Sow 07/15/19, 12:42 AM   2;25 On 3 pressors. Bicarb.  Discussed with Dr Lisabeth Devoid.  Shock could be from obstructive - cardiogenic-large pericardial effusion.  Will Call [facility] cardiologist on call. High risk to transfer on 3 pressors. MAP improving on neo.   3;48 Discussed with Larey Days, Cardiology fellow about transferring to [facility] ICU for ECHO/Cardiology help r/o cardiac tamponade. Discussed with Dr Patsey Berthold about shifting. High risk transfer. Dr Darrick Meigs is aware. Asked Nursing supervisor to check whether ED doc can do a bed side ECHO/or pericardiocentesis if worsens. LA improving also.  Mean time got art line. Getting awake. AGMA improving on bicarb gtt/pressors. SBP 90's. sats still 90 or less. EKG a fib .   4:42 AM Talked to patient wife Bryce Berry over the phone. And to bed side RN. Due to 3 pressors and 100% fio2 in Multi organ failure  Decided not to transfer at this time.  Continue care.   5;37 AM Camera: Restless. Chewing ET. Art line nor opicking up waveform . Cuff SBP 130.  Get ABG Start fentanyl gtt. Follow AM labs.

## 2019-07-17 NOTE — Progress Notes (Addendum)
78 year old male admitted with severe sepsis, with septic shock, underwent PEA arrest, was found to be in asystole.  CODE BLUE was called, ED physician Dr. Tomi Bamberger ran the code.  Patient is currently intubated, NG tube has been inserted.  He is on Levophed.  We will obtain lactic acid, troponin x2, PT PTT, CMP, CBC Reviewed the notes from this morning, there was concern for acute cholecystitis as per Dr. Wynetta Emery right upper quadrant abdominal ultrasound was ordered and is currently pending.  Discussed with patient's wife and family member in the family room. Patient has overall poor prognosis.

## 2019-07-17 NOTE — Consult Note (Signed)
[facility] Consult  Reason for Consult: ? Acute cholecystitis, septic shock  Referring Physician:  Dr. Wynetta Emery   Chief Complaint    Weakness      HPI: Bryce Berry is a 78 y.o. male with septic shock on multiple pressors who was coded overnight and intubated. He has a history of A fib on Xarelto, PE/DVT, CAD with stents, COPD, HTN, HLD, GERD, is wheelchair bound, and presented with weakness to the hospital and found to be hypotensive and bradycardic on admission.  He had imaging performed that demonstrated pneumonia, pleural effusions and a new pericardial effusion, as well as concern for edema around the gallbladder.  He was started on antibiotics overnight, and in the early hours of the morning coded.  He was resuscitated and had return of circulation, but continues to be deteriorating. He had labs this AM with a leukocytosis to 33, INR 10, severe acidosis to 7.1.  His family his being called in to discussion plan for care, and I was asked to weigh in on the possibility of acute cholecystitis.   Past Medical History:  Diagnosis Date  . Atrial fibrillation ([place])   . Atrial flutter ([place])    Possible history in the past - details unclear  . Bilateral pulmonary embolism ([place])    a. 10/2012 - V:Q scan high prob for Bilat PE in setting of dyspnea/hemoptysis-->anticoagulation started.  Marland Kitchen BPH (benign prostatic hyperplasia)   . CAD (coronary artery disease)    Stent PCI in [place] and also [place] in the past - details not clear  . Chronic anticoagulation   . Chronic pain   . 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 COPD (chronic obstructive pulmonary disease) ([place])   . Depression   . Dyslipidemia   . Essential hypertension   . GERD (gastroesophageal reflux disease)   . GI bleed   . Gout   . Hyperlipidemia    Mixed  . Irritable bowel   . Osteoarthritis   . Osteoporosis   . Peripheral arterial disease ([place])    Dopplers Dr.Vyas office October, 2010,  apparent occlusion anterior tibial arteries bilaterally., findings suggest hemodynamically significant stenosis of the proximal right provisional femoral artery and the mid left superficial artery.  Ankle-brachial indices indicate moderate stenosis within both lower extremities  . Posttraumatic stress disorder   . Recurrent UTI (urinary tract infection)   . Right leg DVT ([place])    a. 10/2012 - subocclusive DVT R popliteal vein.  . Wheelchair bound     Past Surgical History:  Procedure Laterality Date  . [place]  . CATARACT EXTRACTION    . COLON RESECTION    . COLONOSCOPY N/A 03/13/2013   Procedure: COLONOSCOPY;  Surgeon: Rogene Houston, MD;  Location: [facility];  Service: Endoscopy;  Laterality: N/A;  730  . COLONOSCOPY N/A 12/22/2014   Procedure: COLONOSCOPY;  Surgeon: Rogene Houston, MD;  Location: [facility];  Service: Endoscopy;  Laterality: N/A;  . CORONARY ANGIOPLASTY WITH STENT PLACEMENT     x 4  . GIVENS CAPSULE STUDY N/A 12/24/2014   Procedure: GIVENS CAPSULE STUDY;  Surgeon: Rogene Houston, MD;  Location: [facility] SU[facility];  Service: Endoscopy;  Laterality: N/A;  . KNEE ARTHROPLASTY     Right  . PROSTATE SURGERY    . TOTAL HIP ARTHROPLASTY     Right-complicated hx  . TOTAL KNEE ARTHROPLASTY     Left    Family  History  Problem Relation Age of Onset  . Diabetes Daughter   . Hypertension Daughter   . Obesity Daughter   . Cancer Maternal Grandmother     Social History   Tobacco Use  . Smoking status: Former Smoker    Packs/day: 1.00    Years: 30.00    Pack years: 30.00    Types: Cigarettes    Start date: 08/31/1956    Quit date: 08/17/2003    Years since quitting: 15.8  . Smokeless tobacco: Never Used  . Tobacco comment: Patient smoked a pack a day for about 30 years  Substance Use Topics  . Alcohol use: No    Alcohol/week: 0.0 standard drinks  . Drug use: No    Types: Other-see comments    Medications:  I have reviewed the  patient's current medications. Prior to Admission:   Current Facility-Administered Medications  Medication Dose Route Frequency Provider Last Rate Last Dose  . 0.9 %  sodium chloride infusion   Intravenous Continuous Murlean Iba, MD   Stopped at 07/01/19 2156  . 0.9 %  sodium chloride infusion   Intravenous PRN Oswald Hillock, MD   Stopped at 08-01-19 1021  . 0.9 %  sodium chloride infusion   Intra-arterial PRN Cecilie Lowers T, MD      . albuterol (VENTOLIN HFA) 108 (90 Base) MCG/ACT inhaler 2 puff  2 puff Inhalation BID PRN Truett Mainland, DO      . amantadine (SYMMETREL) capsule 100 mg  100 mg Oral Daily Truett Mainland, DO   100 mg at 07/01/19 0946  . atorvastatin (LIPITOR) tablet 80 mg  80 mg Oral QPM Truett Mainland, DO   80 mg at 07/01/19 1714  . azithromycin (ZITHROMAX) 500 mg in sodium chloride 0.9 % 250 mL IVPB  500 mg Intravenous Q24H Truett Mainland, DO   Stopped at 07/01/19 2325  . benzonatate (TESSALON) capsule 100 mg  100 mg Oral TID PRN Truett Mainland, DO      . carbidopa-levodopa (SINEMET IR) 25-100 MG per tablet immediate release 1 tablet  1 tablet Oral BID Truett Mainland, DO   1 tablet at 07/01/19 2152  . ceFEPIme (MAXIPIME) 2 g in sodium chloride 0.9 % 100 mL IVPB  2 g Intravenous Q12H Truett Mainland, DO   Stopped at 07/01/19 2226  . Chlorhexidine Gluconate Cloth 2 % PADS 6 each  6 each Topical Q0600 Truett Mainland, DO   6 each at 07/01/19 1710  . dicyclomine (BENTYL) capsule 10 mg  10 mg Oral BID PRN Truett Mainland, DO   10 mg at 07/01/19 1147  . diltiazem (CARDIZEM CD) 24 hr capsule 120 mg  120 mg Oral Daily Johnson, Clanford L, MD      . DULoxetine (CYMBALTA) DR capsule 30 mg  30 mg Oral Daily Truett Mainland, DO   30 mg at 07/01/19 0941  . EPINEPHrine (ADRENALIN) 4 mg in NS 250 mL (0.016 mg/mL) premix infusion  0.5-20 mcg/min Intravenous Titrated Elmer Sow, MD   Stopped at 08/01/19 1021  . fenofibrate tablet 160 mg  160 mg Oral Daily Truett Mainland, DO      . fentaNYL (SUBLIMAZE) injection 25 mcg  25 mcg Intravenous Q2H PRN Johnson, Clanford L, MD      . fentaNYL 2577mcg in NS 246mL (42mcg/ml) infusion-PREMIX  25-200 mcg/hr Intravenous Continuous Elmer Sow, MD   Stopped at 08/01/19 0606  . finasteride (PROSCAR)  tablet 5 mg  5 mg Oral Daily Johnson, Clanford L, MD      . HYDROcodone-acetaminophen (NORCO) 10-325 MG per tablet 1 tablet  1 tablet Oral Q6H PRN Truett Mainland, DO   1 tablet at 07/01/19 2138  . hydrocortisone sodium succinate (SOLU-CORTEF) 100 MG injection 50 mg  50 mg Intravenous Q6H Cecilie Lowers T, MD   50 mg at 22-Jul-2019 0750  . hydrOXYzine (ATARAX/VISTARIL) tablet 25 mg  25 mg Oral TID PRN Truett Mainland, DO      . influenza vaccine adjuvanted (FLUAD) injection 0.5 mL  0.5 mL Intramuscular Tomorrow-1000 Stinson, Jacob J, DO      . insulin aspart (novoLOG) injection 0-15 Units  0-15 Units Subcutaneous TID WC Truett Mainland, DO   2 Units at 07/22/2019 0751  . insulin aspart (novoLOG) injection 0-5 Units  0-5 Units Subcutaneous QHS Stinson, Jacob J, DO      . lidocaine (LIDODERM) 5 % 1 patch  1 patch Transdermal Q12H PRN Truett Mainland, DO      . LORazepam (ATIVAN) tablet 0.5 mg  0.5 mg Oral Daily PRN Truett Mainland, DO      . MEDLINE mouth rinse  15 mL Mouth Rinse BID Wynetta Emery, Clanford L, MD   15 mL at 07/01/19 2158  . methocarbamol (ROBAXIN) tablet 500 mg  500 mg Oral Q6H PRN Truett Mainland, DO      . mupirocin ointment (BACTROBAN) 2 %   Nasal BID Johnson, Clanford L, MD      . norepinephrine (LEVOPHED) 16 mg in 264mL premix infusion  0-40 mcg/min Intravenous Titrated Elmer Sow, MD   Stopped at 07/22/2019 1021  . pantoprazole (PROTONIX) EC tablet 40 mg  40 mg Oral BID Truett Mainland, DO   40 mg at 07/01/19 2152  . phenylephrine (NEO-SYNEPHRINE) 40 mg in sodium chloride 0.9 % 250 mL (0.16 mg/mL) infusion  0-400 mcg/min Intravenous Titrated Wynetta Emery, Clanford L, MD   Stopped at 07/22/2019 1021  . senna  (SENOKOT) tablet 8.6 mg  1 tablet Oral Daily Truett Mainland, DO   8.6 mg at 07/01/19 0941  . sodium bicarbonate 1 mEq/mL injection           . sodium bicarbonate 150 mEq in dextrose 5% 1000 mL infusion  150 mEq Intravenous Continuous Murlean Iba, MD   Stopped at 07-22-2019 1021  . sodium chloride 0.9 % bolus 500 mL  500 mL Intravenous Once Wynetta Emery, Clanford L, MD 999 mL/hr at 07/01/19 1230    . tamsulosin (FLOMAX) capsule 0.4 mg  0.4 mg Oral QPC supper Johnson, Clanford L, MD      . umeclidinium-vilanterol (ANORO ELLIPTA) 62.5-25 MCG/INH 1 puff  1 puff Inhalation Daily Truett Mainland, DO      . vancomycin (VANCOCIN) 1,250 mg in sodium chloride 0.9 % 250 mL IVPB  1,250 mg Intravenous Q24H Murlean Iba, MD   Stopped at 07/01/19 2226  . vasopressin (PITRESSIN) 40 Units in sodium chloride 0.9 % 250 mL (0.16 Units/mL) infusion  0.03 Units/min Intravenous Continuous Elmer Sow, MD   Stopped at July 22, 2019 1021   Current Outpatient Medications  Medication Sig Dispense Refill Last Dose  . albuterol (PROVENTIL) (2.5 MG/3ML) 0.083% nebulizer solution Take 2.5 mg by nebulization every 6 (six) hours as needed for wheezing or shortness of breath.    06/29/2019 at Unknown time  . allopurinol (ZYLOPRIM) 100 MG tablet Take 100 mg by mouth every evening.  07/11/2019 at Unknown time  . ANORO ELLIPTA 62.5-25 MCG/INH AEPB Inhale 1 puff into the lungs daily.   06/29/2019 at Unknown time  . ascorbic acid (VITAMIN C) 500 MG tablet Take 500 mg by mouth daily.    07/11/2019 at Unknown time  . atorvastatin (LIPITOR) 80 MG tablet Take 80 mg by mouth every evening.    06/29/2019 at Unknown time  . benzonatate (TESSALON) 100 MG capsule Take 100 mg by mouth 3 (three) times daily as needed for cough.    07/05/2019 at Unknown time  . calcium-vitamin D (OSCAL WITH D) 500-200 MG-UNIT per tablet Take 1 tablet by mouth daily.    06/29/2019 at Unknown time  . carbidopa-levodopa (SINEMET IR) 25-100 MG tablet Take 1  tablet by mouth 2 (two) times a day.   07/10/2019 at Unknown time  . carvedilol (COREG) 12.5 MG tablet Take 12.5 mg by mouth 2 (two) times daily with a meal.   06/20/2019 at 1000  . COLCRYS 0.6 MG tablet Take 1 tablet by mouth daily as needed (gout pain).    07/13/2019 at Unknown time  . dicyclomine (BENTYL) 10 MG capsule Take 1 capsule (10 mg total) by mouth daily. 30 capsule 3 Past Week at Unknown time  . digoxin (LANOXIN) 0.125 MG tablet Take 1 tablet (0.125 mg total) by mouth daily. 90 tablet 2 Past Week at Unknown time  . diltiazem (CARDIZEM CD) 120 MG 24 hr capsule Take 1 capsule (120 mg total) by mouth daily. As directed based off blood pressure readings 90 capsule 2 07/07/2019 at Unknown time  . DULoxetine (CYMBALTA) 30 MG capsule Take 1 capsule by mouth daily.   07/09/2019 at Unknown time  . fenofibrate (TRICOR) 145 MG tablet Take 1 tablet (145 mg total) by mouth daily. (Patient taking differently: Take 145 mg by mouth every evening. ) 30 tablet 6 07/11/2019 at Unknown time  . fentaNYL (DURAGESIC - DOSED MCG/HR) 25 MCG/HR patch Place 25 mcg onto the skin every 3 (three) days.   Past Week at Unknown time  . ferrous sulfate 325 (65 FE) MG tablet Take 325 mg by mouth daily with breakfast.   06/21/2019 at Unknown time  . finasteride (PROSCAR) 5 MG tablet Take 5 mg by mouth daily.     06/29/2019 at Unknown time  . furosemide (LASIX) 40 MG tablet Take 40 mg by mouth every evening. *May take 80mg  as needed for swelling   07/03/2019 at Unknown time  . HYDROcodone-acetaminophen (NORCO) 10-325 MG tablet Take 1 tablet by mouth every 6 (six) hours as needed for moderate pain or severe pain.    06/29/2019 at Unknown time  . hydrOXYzine (VISTARIL) 25 MG capsule Take 1 capsule by mouth 2 (two) times daily as needed.   06/29/2019 at Unknown time  . ipratropium-albuterol (DUONEB) 0.5-2.5 (3) MG/3ML SOLN Take 3 mLs by nebulization 3 (three) times daily. 360 mL 6 06/29/2019 at Unknown time  . lisinopril  (PRINIVIL,ZESTRIL) 20 MG tablet Take 20 mg by mouth daily. Take one tablet by mouth if blood pressure is over 150   07/11/2019 at Unknown time  . LORazepam (ATIVAN) 0.5 MG tablet Take 0.5 mg by mouth daily as needed for anxiety.   06/26/2019 at Unknown time  . magnesium oxide (MAG-OX) 400 (241.3 MG) MG tablet Take 1 tablet (400 mg total) by mouth 2 (two) times daily. 60 tablet 2 06/29/2019 at Unknown time  . methocarbamol (ROBAXIN) 500 MG tablet Take 1 tablet by mouth 4 (four) times daily  as needed.   07/10/2019 at Unknown time  . Multiple Vitamin (MULTIVITAMIN) tablet Take 1 tablet by mouth daily.     06/29/2019 at Unknown time  . Omega-3 Fatty Acids (FISH OIL) 1000 MG CAPS Take 2 capsules by mouth daily.    06/29/2019 at Unknown time  . pantoprazole (PROTONIX) 40 MG tablet Take 40 mg by mouth 2 (two) times daily.    06/27/2019 at Unknown time  . potassium chloride SA (K-DUR) 20 MEQ tablet Take 1 tablet (20 mEq total) by mouth daily. 90 tablet 2 06/23/2019 at Unknown time  . PROAIR HFA 108 (90 Base) MCG/ACT inhaler Inhale 2 puffs into the lungs 2 (two) times daily as needed for wheezing or shortness of breath. 2 puffs twice daily   07/10/2019 at Unknown time  . rivaroxaban (XARELTO) 20 MG TABS tablet Take 1 tablet (20 mg total) by mouth daily. 30 tablet 10 07/01/2019 at 1000  . senna (SENOKOT) 8.6 MG TABS tablet Take 1 tablet (8.6 mg total) by mouth daily. 30 tablet 3 Past Week at Unknown time  . tamsulosin (FLOMAX) 0.4 MG CAPS capsule Take 0.4 mg by mouth daily after supper.   07/15/2019 at Unknown time  . Amantadine HCl 100 MG tablet Take 1 tablet by mouth daily.     . diclofenac sodium (VOLTAREN) 1 % GEL Apply 1 application topically daily. Applied to feet     . dicyclomine (BENTYL) 10 MG capsule Take 1 capsule (10 mg total) by mouth 2 (two) times daily as needed for up to 14 days for spasms. 30 capsule 0   . hydrALAZINE (APRESOLINE) 25 MG tablet Take 1 tablet by mouth 2 (two) times daily.     Marland Kitchen  lidocaine (LIDODERM) 5 % Place 1 patch onto the skin daily as needed (for pain). Every 12 hours as needed     . metoprolol tartrate (LOPRESSOR) 50 MG tablet Take 50 mg by mouth 2 (two) times daily.     . nitroGLYCERIN (NITROSTAT) 0.4 MG SL tablet Place 1 tablet (0.4 mg total) under the tongue every 5 (five) minutes x 3 doses as needed. 25 tablet 3   . rivastigmine (EXELON) 9.5 mg/24hr APPLY 1 PATCH DAILY. 90 patch 6   Scheduled: . amantadine  100 mg Oral Daily  . atorvastatin  80 mg Oral QPM  . carbidopa-levodopa  1 tablet Oral BID  . Chlorhexidine Gluconate Cloth  6 each Topical Q0600  . diltiazem  120 mg Oral Daily  . DULoxetine  30 mg Oral Daily  . fenofibrate  160 mg Oral Daily  . finasteride  5 mg Oral Daily  . hydrocortisone sod succinate (SOLU-CORTEF) inj  50 mg Intravenous Q6H  . influenza vaccine adjuvanted  0.5 mL Intramuscular Tomorrow-1000  . insulin aspart  0-15 Units Subcutaneous TID WC  . insulin aspart  0-5 Units Subcutaneous QHS  . mouth rinse  15 mL Mouth Rinse BID  . mupirocin ointment   Nasal BID  . pantoprazole  40 mg Oral BID  . senna  1 tablet Oral Daily  . sodium bicarbonate      . tamsulosin  0.4 mg Oral QPC supper  . umeclidinium-vilanterol  1 puff Inhalation Daily   Continuous: . sodium chloride Stopped (07/01/19 2156)  . sodium chloride Stopped (08-01-19 1021)  . sodium chloride    . azithromycin Stopped (07/01/19 2325)  . ceFEPime (MAXIPIME) IV Stopped (07/01/19 2226)  . epinephrine Stopped (01-Aug-2019 1021)  . fentaNYL infusion INTRAVENOUS Stopped (2019/08/01 0606)  .  norepinephrine (LEVOPHED) Adult infusion Stopped (07/14/19 1021)  . phenylephrine (NEO-SYNEPHRINE) Adult infusion Stopped (2019/07/14 1021)  . sodium bicarbonate 150 mEq in dextrose 5% 1000 mL Stopped (2019/07/14 1021)  . sodium chloride 999 mL/hr at 07/01/19 1230  . vancomycin Stopped (07/01/19 2226)  . vasopressin (PITRESSIN) infusion - *FOR SHOCK* Stopped (July 14, 2019 1021)   [id number]  chloride, Place/Maintain arterial line **AND** sodium chloride, albuterol, benzonatate, dicyclomine, fentaNYL (SUBLIMAZE) injection, HYDROcodone-acetaminophen, hydrOXYzine, lidocaine, LORazepam, methocarbamol  Allergies  Allergen Reactions  . Latex   . Omeprazole Other (See Comments)    Reaction unknown-possible upset stomach  . Other     Paper tape  . Penicillins Other (See Comments)    Has patient had a PCN reaction causing immediate rash, facial/tongue/throat swelling, SOB or lightheadedness with hypotension: No Has patient had a PCN reaction causing severe rash involving mucus membranes or skin necrosis: No Has patient had a PCN reaction that required hospitalization No Has patient had a PCN reaction occurring within the last 10 years: No If all of the above answers are "NO", then may proceed with Cephalosporin use.    Rapid heart rate; flushed  . Sulfonamide Derivatives Other (See Comments)    Rapid heart rate; flushed  . Iodine Rash     ROS:  Review of systems not obtained due to patient factors.  Blood pressure (!) 51/11, pulse 74, temperature (!) 97.5 F (36.4 C), resp. rate 19, height 6\' 5"  (1.956 m), weight 109.5 kg, SpO2 (!) 84 %. Physical Exam Vitals signs reviewed.  Constitutional:      General: He is sleeping.     Appearance: He is morbidly obese.     Interventions: He is sedated and intubated.  HENT:     Head: Normocephalic.     Nose: Nose normal.     Mouth/Throat:     Mouth: Mucous membranes are moist.  Cardiovascular:     Rate and Rhythm: Rhythm irregular.  Pulmonary:     Effort: He is intubated.     Comments: Intubated  Abdominal:     General: There is no distension.     Palpations: Abdomen is soft.  Musculoskeletal:        General: Swelling present.  Neurological:     Comments: Intubated, no purposeful movements, some grimacing     Results: Results for orders placed or performed during the hospital encounter of 06/27/2019 (from the past 48  hour(s))  CBG monitoring, ED     Status: Abnormal   Collection Time: 07/01/2019  6:26 PM  Result Value Ref Range   Glucose-Capillary 192 (H) 70 - 99 mg/dL  Hemoglobin A1c     Status: Abnormal   Collection Time: 06/17/2019  6:54 PM  Result Value Ref Range   Hgb A1c MFr Bld 6.2 (H) 4.8 - 5.6 %    Comment: (NOTE) Pre diabetes:          5.7%-6.4% Diabetes:              >6.4% Glycemic control for   <7.0% adults with diabetes    Mean Plasma Glucose 131.24 mg/dL    Comment: Performed at [facility] [address] [place], [place] [address]  SARS CORONAVIRUS 2 (TAT 6-24 HRS) Nasopharyngeal Nasopharyngeal Swab     Status: None   Collection Time: 07/05/2019  7:08 PM   Specimen: Nasopharyngeal Swab  Result Value Ref Range   SARS Coronavirus 2 NEGATIVE NEGATIVE    Comment: (NOTE) SARS-CoV-2 target nucleic acids are NOT DETECTED. The  SARS-CoV-2 RNA is generally detectable in upper and lower respiratory specimens during the acute phase of infection. Negative results do not preclude SARS-CoV-2 infection, do not rule out co-infections with other pathogens, and should not be used as the sole basis for treatment or other patient management decisions. Negative results must be combined with clinical observations, patient history, and epidemiological information. The expected result is Negative. Fact Sheet for Patients: SugarRoll.be Fact Sheet for Healthcare Providers: https://www.woods-mathews.com/ This test is not yet approved or cleared by the [place] FDA and  has been authorized for detection and/or diagnosis of SARS-CoV-2 by FDA under an Emergency Use Authorization (EUA). This EUA will remain  in effect (meaning this test can be used) for the duration of the COVID-19 declaration under Section 56 4(b)(1) of the Act, 21 U.S.C. section 360bbb-3(b)(1), unless the authorization is terminated or revoked sooner. Performed at [facility], [place] [address] [place], [place] [address]   Lactic acid, plasma     Status: None   Collection Time: 06/23/2019  7:10 PM  Result Value Ref Range   Lactic Acid, Venous 1.9 0.5 - 1.9 mmol/L    Comment: Performed at [facility], [address] [place], [place] [address]  Comprehensive metabolic panel     Status: Abnormal   Collection Time: 06/28/2019  7:10 PM  Result Value Ref Range   Sodium 134 (L) 135 - 145 mmol/L   Potassium 5.0 3.5 - 5.1 mmol/L   Chloride 111 98 - 111 mmol/L   CO2 17 (L) 22 - 32 mmol/L   Glucose, Bld 181 (H) 70 - 99 mg/dL   BUN 105 (H) 8 - 23 mg/dL   Creatinine, Ser 1.68 (H) 0.61 - 1.24 mg/dL   Calcium 7.8 (L) 8.9 - 10.3 mg/dL   Total Protein 6.0 (L) 6.5 - 8.1 g/dL   Albumin 2.4 (L) 3.5 - 5.0 g/dL   AST 13 (L) 15 - 41 U/L   ALT 6 0 - 44 U/L   Alkaline Phosphatase 46 38 - 126 U/L   Total Bilirubin 0.7 0.3 - 1.2 mg/dL   GFR calc non Af Amer 39 (L) >60 mL/min   GFR calc Af Amer 45 (L) >60 mL/min   Anion gap 6 5 - 15    Comment: Performed at [facility], [address] [place], [place] [address]  CBC WITH DIFFERENTIAL     Status: Abnormal   Collection Time: 07/12/2019  7:10 PM  Result Value Ref Range   WBC 19.9 (H) 4.0 - 10.5 K/uL   RBC 3.50 (L) 4.22 - 5.81 MIL/uL   Hemoglobin 10.5 (L) 13.0 - 17.0 g/dL   HCT 34.2 (L) 39.0 - 52.0 %   MCV 97.7 80.0 - 100.0 fL   MCH 30.0 26.0 - 34.0 pg   MCHC 30.7 30.0 - 36.0 g/dL   RDW 16.1 (H) 11.5 - 15.5 %   Platelets 262 150 - 400 K/uL   nRBC 0.1 0.0 - 0.2 %   Neutrophils Relative % 81 %   Neutro Abs 15.8 (H) 1.7 - 7.7 K/uL   Lymphocytes Relative 10 %   Lymphs Abs 2.1 0.7 - 4.0 K/uL   Monocytes Relative 6 %   Monocytes Absolute 1.3 (H) 0.1 - 1.0 K/uL   Eosinophils Relative 0 %   Eosinophils Absolute 0.0 0.0 - 0.5 K/uL   Basophils Relative 0 %   Basophils Absolute 0.0 0.0 - 0.1 K/uL   Immature Granulocytes 3 %   Abs Immature  Granulocytes 0.63 (H) 0.00 - 0.07 K/uL    Comment: Performed at [facility], [address], [place], [place] [address]  APTT     Status: Abnormal   Collection Time: 06/20/2019  7:10 PM  Result Value Ref Range   aPTT 44 (H) 24 - 36 seconds    Comment:        IF BASELINE aPTT IS ELEVATED, SUGGEST PATIENT RISK ASSESSMENT BE USED TO DETERMINE APPROPRIATE ANTICOAGULANT THERAPY. Performed at [facility], [address] [place], [place] [address]   Protime-INR     Status: Abnormal   Collection Time: 07/15/2019  7:10 PM  Result Value Ref Range   Prothrombin Time 35.4 (H) 11.4 - 15.2 seconds   INR 3.6 (H) 0.8 - 1.2    Comment: (NOTE) INR goal varies based on device and disease states. Performed at [facility], [address] [place], [place] [address]   Blood Culture (routine x 2)     Status: None (Preliminary result)   Collection Time: 06/25/2019  7:10 PM   Specimen: BLOOD RIGHT HAND  Result Value Ref Range   Specimen Description BLOOD RIGHT HAND    Special Requests      BOTTLES DRAWN AEROBIC ONLY Blood Culture adequate volume   Culture      NO GROWTH 2 DAYS Performed at [facility], [address], [place], [place] [address]    Report Status PENDING   Troponin I (High Sensitivity)     Status: Abnormal   Collection Time: 06/28/2019  7:10 PM  Result Value Ref Range   Troponin I (High Sensitivity) 19 (H) <18 ng/L    Comment: (NOTE) Elevated high sensitivity troponin I (hsTnI) values and significant  changes across serial measurements may suggest ACS but many other  chronic and acute conditions are known to elevate hsTnI results.  Refer to the Links section for chest pain algorithms and additional  guidance. Performed at [facility], [address], [place], [place] [address]   Digoxin level     Status: Abnormal   Collection Time: 07/10/2019  7:10 PM  Result Value Ref Range   Digoxin Level <0.2 (L) 0.8 - 2.0 ng/mL    Comment: Performed at [facility], [address], [place], [place] [address]  Blood Culture (routine x 2)     Status: None (Preliminary result)   Collection  Time: 06/21/2019  7:25 PM   Specimen: BLOOD RIGHT WRIST  Result Value Ref Range   Specimen Description BLOOD RIGHT WRIST    Special Requests      BOTTLES DRAWN AEROBIC ONLY Blood Culture adequate volume   Culture      NO GROWTH 2 DAYS Performed at [facility], [address], [place], [place] [address]    Report Status PENDING   Urinalysis, Routine w reflex microscopic     Status: Abnormal   Collection Time: 07/12/2019  7:31 PM  Result Value Ref Range   Color, Urine YELLOW YELLOW   APPearance HAZY (A) CLEAR   Specific Gravity, Urine 1.016 1.005 - 1.030   pH 5.0 5.0 - 8.0   Glucose, UA NEGATIVE NEGATIVE mg/dL   Hgb urine dipstick MODERATE (A) NEGATIVE   Bilirubin Urine NEGATIVE NEGATIVE   Ketones, ur NEGATIVE NEGATIVE mg/dL   Protein, ur NEGATIVE NEGATIVE mg/dL   Nitrite NEGATIVE NEGATIVE   Leukocytes,Ua TRACE (A) NEGATIVE   RBC / HPF 21-50 0 - 5 RBC/hpf   WBC, UA 11-20 0 - 5 WBC/hpf   Bacteria, UA NONE  SEEN NONE SEEN   Squamous Epithelial / LPF 0-5 0 - 5   WBC Clumps PRESENT    Mucus PRESENT    Hyaline Casts, UA PRESENT     Comment: Performed at [facility], [address], [place], [place] [address]  Urine culture     Status: Abnormal (Preliminary result)   Collection Time: 06/25/2019  7:31 PM   Specimen: In/Out Cath Urine  Result Value Ref Range   Specimen Description      IN/OUT CATH URINE Performed at [facility], [address], [place], [place] [address]    Special Requests      NONE Performed at [facility], [address], [place], [place] [address]    Culture (A)     10,000 COLONIES/mL GRAM NEGATIVE RODS STAPHYLOCOCCUS AUREUS    Report Status PENDING   Lactic acid, plasma     Status: None   Collection Time: 06/26/2019  9:18 PM  Result Value Ref Range   Lactic Acid, Venous 1.2 0.5 - 1.9 mmol/L    Comment: Performed at [facility], [address] [place], [place] [address]  Troponin I (High Sensitivity)     Status: Abnormal   Collection Time: 07/01/2019  9:18 PM   Result Value Ref Range   Troponin I (High Sensitivity) 20 (H) <18 ng/L    Comment: (NOTE) Elevated high sensitivity troponin I (hsTnI) values and significant  changes across serial measurements may suggest ACS but many other  chronic and acute conditions are known to elevate hsTnI results.  Refer to the "Links" section for chest pain algorithms and additional  guidance. Performed at [facility], [address] [place], [place] [address]   Procalcitonin - Baseline     Status: None   Collection Time: 07/13/2019  9:18 PM  Result Value Ref Range   Procalcitonin 0.19 ng/mL    Comment:        Interpretation: PCT (Procalcitonin) <= 0.5 ng/mL: Systemic infection (sepsis) is not likely. Local bacterial infection is possible. (NOTE)       Sepsis PCT Algorithm           Lower Respiratory Tract                                      Infection PCT Algorithm    ----------------------------     ----------------------------         PCT < 0.25 ng/mL                PCT < 0.10 ng/mL         Strongly encourage             Strongly discourage   discontinuation of antibiotics    initiation of antibiotics    ----------------------------     -----------------------------       PCT 0.25 - 0.50 ng/mL            PCT 0.10 - 0.25 ng/mL               OR       >80% decrease in PCT            Discourage initiation of  antibiotics      Encourage discontinuation           of antibiotics    ----------------------------     -----------------------------         PCT >= 0.50 ng/mL              PCT 0.26 - 0.50 ng/mL               AND        <80% decrease in PCT             Encourage initiation of                                             antibiotics       Encourage continuation           of antibiotics    ----------------------------     -----------------------------        PCT >= 0.50 ng/mL                  PCT > 0.50 ng/mL               AND         increase in PCT                   Strongly encourage                                      initiation of antibiotics    Strongly encourage escalation           of antibiotics                                     -----------------------------                                           PCT <= 0.25 ng/mL                                                 OR                                        > 80% decrease in PCT                                     Discontinue / Do not initiate                                             antibiotics Performed at [facility], [address], [place], [place] [address]   MRSA PCR Screening     Status: Abnormal   Collection Time: 07/01/2019 10:34  PM   Specimen: Nasopharyngeal  Result Value Ref Range   MRSA by PCR POSITIVE (A) NEGATIVE    Comment:        The GeneXpert MRSA Assay (FDA approved for NASAL specimens only), is one component of a comprehensive MRSA colonization surveillance program. It is not intended to diagnose MRSA infection nor to guide or monitor treatment for MRSA infections. RESULT CALLED TO, READ BACK BY AND VERIFIED WITH: HYLTON L. @ [id number] ON TO:[id number] BY HENDERSON L. Performed at [facility], [address], [place], [place] [address]   Glucose, capillary     Status: Abnormal   Collection Time: 07/01/19 12:25 AM  Result Value Ref Range   Glucose-Capillary 164 (H) 70 - 99 mg/dL  CBC     Status: Abnormal   Collection Time: 07/01/19  1:35 AM  Result Value Ref Range   WBC 22.1 (H) 4.0 - 10.5 K/uL   RBC 3.40 (L) 4.22 - 5.81 MIL/uL   Hemoglobin 10.4 (L) 13.0 - 17.0 g/dL   HCT 32.7 (L) 39.0 - 52.0 %   MCV 96.2 80.0 - 100.0 fL   MCH 30.6 26.0 - 34.0 pg   MCHC 31.8 30.0 - 36.0 g/dL   RDW 16.4 (H) 11.5 - 15.5 %   Platelets 264 150 - 400 K/uL   nRBC 0.1 0.0 - 0.2 %    Comment: Performed at [facility], [address] [place], [place] [address]  HIV Antibody (routine testing w rflx)     Status: None   Collection Time: 07/01/19  5:27 AM  Result Value Ref  Range   HIV Screen 4th Generation wRfx NON REACTIVE NON REACTIVE    Comment: Performed at [facility], [place] [address] [place], [place] [id number]  Basic metabolic panel     Status: Abnormal   Collection Time: 07/01/19  5:27 AM  Result Value Ref Range   Sodium 136 135 - 145 mmol/L   Potassium 5.0 3.5 - 5.1 mmol/L   Chloride 109 98 - 111 mmol/L   CO2 16 (L) 22 - 32 mmol/L   Glucose, Bld 160 (H) 70 - 99 mg/dL   BUN 117 (H) 8 - 23 mg/dL    Comment: RESULTS CONFIRMED BY MANUAL DILUTION   Creatinine, Ser 1.70 (H) 0.61 - 1.24 mg/dL   Calcium 7.9 (L) 8.9 - 10.3 mg/dL   GFR calc non Af Amer 38 (L) >60 mL/min   GFR calc Af Amer 44 (L) >60 mL/min   Anion gap 11 5 - 15    Comment: Performed at [facility], [address] [place], [place] [address]  CBC     Status: Abnormal   Collection Time: 07/01/19  5:27 AM  Result Value Ref Range   WBC 22.7 (H) 4.0 - 10.5 K/uL   RBC 3.85 (L) 4.22 - 5.81 MIL/uL   Hemoglobin 11.7 (L) 13.0 - 17.0 g/dL   HCT 36.4 (L) 39.0 - 52.0 %   MCV 94.5 80.0 - 100.0 fL   MCH 30.4 26.0 - 34.0 pg   MCHC 32.1 30.0 - 36.0 g/dL   RDW 16.2 (H) 11.5 - 15.5 %   Platelets 292 150 - 400 K/uL   nRBC 0.1 0.0 - 0.2 %    Comment: Performed at [facility], [address] [place], [place] [address]  Procalcitonin     Status: None   Collection Time: 07/01/19  5:27 AM  Result Value Ref Range   Procalcitonin 0.26 ng/mL    Comment:  Interpretation: PCT (Procalcitonin) <= 0.5 ng/mL: Systemic infection (sepsis) is not likely. Local bacterial infection is possible. (NOTE)       Sepsis PCT Algorithm           Lower Respiratory Tract                                      Infection PCT Algorithm    ----------------------------     ----------------------------         PCT < 0.25 ng/mL                PCT < 0.10 ng/mL         Strongly encourage             Strongly discourage   discontinuation of antibiotics    initiation of antibiotics    ----------------------------      -----------------------------       PCT 0.25 - 0.50 ng/mL            PCT 0.10 - 0.25 ng/mL               OR       >80% decrease in PCT            Discourage initiation of                                            antibiotics      Encourage discontinuation           of antibiotics    ----------------------------     -----------------------------         PCT >= 0.50 ng/mL              PCT 0.26 - 0.50 ng/mL               AND        <80% decrease in PCT             Encourage initiation of                                             antibiotics       Encourage continuation           of antibiotics    ----------------------------     -----------------------------        PCT >= 0.50 ng/mL                  PCT > 0.50 ng/mL               AND         increase in PCT                  Strongly encourage                                      initiation of antibiotics    Strongly encourage escalation           of antibiotics                                     -----------------------------  PCT <= 0.25 ng/mL                                                 OR                                        > 80% decrease in PCT                                     Discontinue / Do not initiate                                             antibiotics Performed at [facility], [address] [place], [place] [address]   Respiratory Panel by PCR     Status: None   Collection Time: 07/01/19  8:24 AM   Specimen: Nasopharyngeal Swab; Respiratory  Result Value Ref Range   Adenovirus NOT DETECTED NOT DETECTED   Coronavirus 229E NOT DETECTED NOT DETECTED    Comment: (NOTE) The Coronavirus on the Respiratory Panel, DOES NOT test for the novel  Coronavirus (2019 nCoV)    Coronavirus HKU1 NOT DETECTED NOT DETECTED   Coronavirus NL63 NOT DETECTED NOT DETECTED   Coronavirus OC43 NOT DETECTED NOT DETECTED   Metapneumovirus NOT DETECTED NOT DETECTED   Rhinovirus /  Enterovirus NOT DETECTED NOT DETECTED   Influenza A NOT DETECTED NOT DETECTED   Influenza B NOT DETECTED NOT DETECTED   Parainfluenza Virus 1 NOT DETECTED NOT DETECTED   Parainfluenza Virus 2 NOT DETECTED NOT DETECTED   Parainfluenza Virus 3 NOT DETECTED NOT DETECTED   Parainfluenza Virus 4 NOT DETECTED NOT DETECTED   Respiratory Syncytial Virus NOT DETECTED NOT DETECTED   Bordetella pertussis NOT DETECTED NOT DETECTED   Chlamydophila pneumoniae NOT DETECTED NOT DETECTED   Mycoplasma pneumoniae NOT DETECTED NOT DETECTED    Comment: Performed at [facility], [place] [address] [place], [place] [address]  Strep pneumoniae urinary antigen     Status: None   Collection Time: 07/01/19  8:24 AM  Result Value Ref Range   Strep Pneumo Urinary Antigen NEGATIVE NEGATIVE    Comment:        Infection due to S. pneumoniae cannot be absolutely ruled out since the antigen present may be below the detection limit of the test. Performed at [facility], [place] [address] [place], [place] [address]   Rapid urine drug screen (hospital performed)     Status: Abnormal   Collection Time: 07/01/19  8:24 AM  Result Value Ref Range   Opiates POSITIVE (A) NONE DETECTED   Cocaine NONE DETECTED NONE DETECTED   Benzodiazepines NONE DETECTED NONE DETECTED   Amphetamines NONE DETECTED NONE DETECTED   Tetrahydrocannabinol NONE DETECTED NONE DETECTED   Barbiturates NONE DETECTED NONE DETECTED    Comment: (NOTE) DRUG SCREEN FOR MEDICAL PURPOSES ONLY.  IF CONFIRMATION IS NEEDED FOR ANY PURPOSE, NOTIFY LAB WITHIN 5 DAYS. LOWEST DETECTABLE LIMITS FOR URINE DRUG SCREEN Drug Class  Cutoff (ng/mL) Amphetamine and metabolites    1000 Barbiturate and metabolites    200 Benzodiazepine                 [id number] Tricyclics and metabolites     300 Opiates and metabolites        300 Cocaine and metabolites        300 THC                            50 Performed at [facility], [address], [place], [place] [address]   Blood gas, arterial     Status: Abnormal   Collection Time: 07/01/19 11:45 PM  Result Value Ref Range   FIO2 100.00    pH, Arterial 6.906 (LL) 7.350 - 7.450    Comment: CRITICAL RESULT CALLED TO, READ BACK BY AND VERIFIED WITH: LURZ,G. AT 0003 ON July 03, 2019 BY EVA    pCO2 arterial 49.9 (H) 32.0 - 48.0 mmHg   pO2, Arterial 87.7 83.0 - 108.0 mmHg   Bicarbonate 8.1 (L) 20.0 - 28.0 mmol/L   Acid-base deficit 20.8 (H) 0.0 - 2.0 mmol/L   O2 Saturation 84.8 %   Patient temperature 37.0    Allens test (pass/fail) PASS PASS    Comment: Performed at [facility], [address] [place], [place] [address]  Troponin I (High Sensitivity)     Status: Abnormal   Collection Time: 07/01/19 11:58 PM  Result Value Ref Range   Troponin I (High Sensitivity) 32 (H) <18 ng/L    Comment: (NOTE) Elevated high sensitivity troponin I (hsTnI) values and significant  changes across serial measurements may suggest ACS but many other  chronic and acute conditions are known to elevate hsTnI results.  Refer to the Links section for chest pain algorithms and additional  guidance. Performed at [facility], [address], [place], [place] [address]   Lactic acid, plasma     Status: Abnormal   Collection Time: 07/01/19 11:58 PM  Result Value Ref Range   Lactic Acid, Venous 8.5 (HH) 0.5 - 1.9 mmol/L    Comment: CRITICAL RESULT CALLED TO, READ BACK BY AND VERIFIED WITH: JEFFERSON,V @ 0030 ON 07/03/19 BY JUW Performed at [facility], [address], [place], [place] [address]   CMP today     Status: Abnormal   Collection Time: 07/01/19 11:58 PM  Result Value Ref Range   Sodium 138 135 - 145 mmol/L   Potassium 6.0 (H) 3.5 - 5.1 mmol/L   Chloride 112 (H) 98 - 111 mmol/L   CO2 10 (L) 22 - 32 mmol/L   Glucose, Bld 205 (H) 70 - 99 mg/dL   BUN 146 (H) 8 - 23 mg/dL    Comment: RESULTS CONFIRMED BY MANUAL DILUTION   Creatinine, Ser 2.05 (H) 0.61 - 1.24 mg/dL   Calcium 8.2 (L) 8.9 - 10.3  mg/dL   Total Protein 4.6 (L) 6.5 - 8.1 g/dL   Albumin 1.8 (L) 3.5 - 5.0 g/dL   AST 34 15 - 41 U/L   ALT 5 0 - 44 U/L   Alkaline Phosphatase 49 38 - 126 U/L   Total Bilirubin 0.8 0.3 - 1.2 mg/dL   GFR calc non Af Amer 31 (L) >60 mL/min   GFR calc Af Amer 35 (L) >60 mL/min   Anion gap 16 (H) 5 - 15    Comment: Performed at [facility], [address] [place], [place] [address]  APTT     Status: Abnormal   Collection Time: 07/01/19 11:58 PM  Result Value Ref Range   aPTT 57 (H) 24 - 36 seconds    Comment:        IF BASELINE aPTT IS ELEVATED, SUGGEST PATIENT RISK ASSESSMENT BE USED TO DETERMINE APPROPRIATE ANTICOAGULANT THERAPY. Performed at [facility], [address], [place], [place] [address]   CBC today     Status: Abnormal   Collection Time: 07/01/19 11:58 PM  Result Value Ref Range   WBC 26.9 (H) 4.0 - 10.5 K/uL    Comment: WHITE COUNT CONFIRMED ON SMEAR   RBC 3.34 (L) 4.22 - 5.81 MIL/uL   Hemoglobin 10.3 (L) 13.0 - 17.0 g/dL   HCT 34.1 (L) 39.0 - 52.0 %   MCV 102.1 (H) 80.0 - 100.0 fL   MCH 30.8 26.0 - 34.0 pg   MCHC 30.2 30.0 - 36.0 g/dL   RDW 16.9 (H) 11.5 - 15.5 %   Platelets 269 150 - 400 K/uL   nRBC 0.5 (H) 0.0 - 0.2 %    Comment: Performed at [facility], [address], [place], [place] [address]  Troponin I (High Sensitivity)     Status: Abnormal   Collection Time: July 26, 2019  2:02 AM  Result Value Ref Range   Troponin I (High Sensitivity) 43 (H) <18 ng/L    Comment: (NOTE) Elevated high sensitivity troponin I (hsTnI) values and significant  changes across serial measurements may suggest ACS but many other  chronic and acute conditions are known to elevate hsTnI results.  Refer to the "Links" section for chest pain algorithms and additional  guidance. Performed at [facility], [address], [place], [place] [address]   Brain natriuretic peptide     Status: None   Collection Time: 07-26-19  2:02 AM  Result Value Ref Range   B Natriuretic Peptide 64.0  0.0 - 100.0 pg/mL    Comment: Performed at [facility], [address] [place], [place] [address]  Lactic acid, plasma     Status: Abnormal   Collection Time: 26-Jul-2019  2:03 AM  Result Value Ref Range   Lactic Acid, Venous 4.8 (HH) 0.5 - 1.9 mmol/L    Comment: CRITICAL RESULT CALLED TO, READ BACK BY AND VERIFIED WITH: HARRIS,B @ 0236 ON 2019/07/26 BY JUW Performed at [facility], [address], [place], [place] [address]   Blood gas, arterial     Status: Abnormal   Collection Time: 2019/07/26  2:30 AM  Result Value Ref Range   FIO2 100.00    pH, Arterial 7.323 (L) 7.350 - 7.450   pCO2 arterial 24.5 (L) 32.0 - 48.0 mmHg   pO2, Arterial 149 (H) 83.0 - 108.0 mmHg   Bicarbonate 15.0 (L) 20.0 - 28.0 mmol/L   Acid-base deficit 12.5 (H) 0.0 - 2.0 mmol/L   O2 Saturation 98.4 %   Patient temperature 37.0    Allens test (pass/fail) NOT INDICATED (A) PASS    Comment: Performed at [facility], [address], [place], [place] [address]  Procalcitonin     Status: None   Collection Time: 26-Jul-2019  4:41 AM  Result Value Ref Range   Procalcitonin 1.78 ng/mL    Comment:        Interpretation: PCT > 0.5 ng/mL and <= 2 ng/mL: Systemic infection (sepsis) is possible, but other conditions are known to elevate PCT as well. (NOTE)       Sepsis PCT Algorithm  Lower Respiratory Tract                                      Infection PCT Algorithm    ----------------------------     ----------------------------         PCT < 0.25 ng/mL                PCT < 0.10 ng/mL         Strongly encourage             Strongly discourage   discontinuation of antibiotics    initiation of antibiotics    ----------------------------     -----------------------------       PCT 0.25 - 0.50 ng/mL            PCT 0.10 - 0.25 ng/mL               OR       >80% decrease in PCT            Discourage initiation of                                            antibiotics      Encourage discontinuation           of  antibiotics    ----------------------------     -----------------------------         PCT >= 0.50 ng/mL              PCT 0.26 - 0.50 ng/mL                AND       <80% decrease in PCT             Encourage initiation of                                             antibiotics       Encourage continuation           of antibiotics    ----------------------------     -----------------------------        PCT >= 0.50 ng/mL                  PCT > 0.50 ng/mL               AND         increase in PCT                  Strongly encourage                                      initiation of antibiotics    Strongly encourage escalation           of antibiotics                                     -----------------------------  PCT <= 0.25 ng/mL                                                 OR                                        > 80% decrease in PCT                                     Discontinue / Do not initiate                                             antibiotics Performed at [facility], [address] [place], [place] [address]   CBC with Differential     Status: Abnormal   Collection Time: 07/04/2019  4:41 AM  Result Value Ref Range   WBC 33.7 (H) 4.0 - 10.5 K/uL   RBC 3.11 (L) 4.22 - 5.81 MIL/uL   Hemoglobin 9.4 (L) 13.0 - 17.0 g/dL   HCT 30.4 (L) 39.0 - 52.0 %   MCV 97.7 80.0 - 100.0 fL   MCH 30.2 26.0 - 34.0 pg   MCHC 30.9 30.0 - 36.0 g/dL   RDW 16.4 (H) 11.5 - 15.5 %   Platelets 271 150 - 400 K/uL   nRBC 0.3 (H) 0.0 - 0.2 %   Neutrophils Relative % 83 %   Neutro Abs 28.0 (H) 1.7 - 7.7 K/uL   Lymphocytes Relative 6 %   Lymphs Abs 1.9 0.7 - 4.0 K/uL   Monocytes Relative 7 %   Monocytes Absolute 2.5 (H) 0.1 - 1.0 K/uL   Eosinophils Relative 0 %   Eosinophils Absolute 0.0 0.0 - 0.5 K/uL   Basophils Relative 0 %   Basophils Absolute 0.1 0.0 - 0.1 K/uL   Immature Granulocytes 4 %   Abs Immature Granulocytes 1.32 (H) 0.00 - 0.07 K/uL     Comment: Performed at [facility], [address], [place], [place] [address]  Comprehensive metabolic panel     Status: Abnormal   Collection Time: Jul 04, 2019  4:41 AM  Result Value Ref Range   Sodium 142 135 - 145 mmol/L   Potassium 4.7 3.5 - 5.1 mmol/L    Comment: DELTA CHECK NOTED   Chloride 114 (H) 98 - 111 mmol/L   CO2 12 (L) 22 - 32 mmol/L   Glucose, Bld 139 (H) 70 - 99 mg/dL   BUN 99 (H) 8 - 23 mg/dL   Creatinine, Ser 1.73 (H) 0.61 - 1.24 mg/dL   Calcium 6.6 (L) 8.9 - 10.3 mg/dL   Total Protein 4.2 (L) 6.5 - 8.1 g/dL   Albumin 2.1 (L) 3.5 - 5.0 g/dL   AST 37 15 - 41 U/L   ALT 6 0 - 44 U/L   Alkaline Phosphatase 33 (L) 38 - 126 U/L   Total Bilirubin 1.1 0.3 - 1.2 mg/dL   GFR calc non Af Amer 38 (L) >60 mL/min   GFR calc Af Amer 43 (L) >60 mL/min   Anion gap 16 (H) 5 -  15    Comment: Performed at [facility], [address], [place], [place] [address]  Magnesium     Status: None   Collection Time: 07-18-2019  4:41 AM  Result Value Ref Range   Magnesium 1.8 1.7 - 2.4 mg/dL    Comment: Performed at [facility], [address] [place], [place] [address]  Blood gas, arterial     Status: Abnormal   Collection Time: 07/18/19  5:30 AM  Result Value Ref Range   FIO2 100.00    pH, Arterial 7.135 (LL) 7.350 - 7.450    Comment: CRITICAL RESULT CALLED TO, READ BACK BY AND VERIFIED WITH: SANDY WADE,RN @0624  July 18, 2019 KAY    pCO2 arterial 20.0 (L) 32.0 - 48.0 mmHg   pO2, Arterial 144 (H) 83.0 - 108.0 mmHg   Bicarbonate 9.1 (L) 20.0 - 28.0 mmol/L   Acid-base deficit 21.0 (H) 0.0 - 2.0 mmol/L   O2 Saturation 97.1 %   Patient temperature 37.0    Allens test (pass/fail) PASS PASS    Comment: Performed at [facility], [address] [place], [place] [address]  Protime-INR     Status: Abnormal   Collection Time: 2019-07-18  5:43 AM  Result Value Ref Range   Prothrombin Time >90.0 (H) 11.4 - 15.2 seconds   INR >10.0 (HH) 0.8 - 1.2    Comment: CRITICAL RESULT CALLED TO, READ BACK BY  AND VERIFIED WITH: SANDY WADE,RN @0654  11/162020 KAY (NOTE) INR goal varies based on device and disease states. Performed at [facility], [address], [place], [place] [address]    Personally reviewed CT- pericardial and pleural effusion, some edema around distended gallbladder, no rim enhancement  Dg Chest 1 View  Result Date: 07/01/2019 CLINICAL DATA:  PICC line placement. EXAM: CHEST  1 VIEW COMPARISON:  07/09/2019 FINDINGS: Stable massive cardiac enlargement and tortuous calcified thoracic aorta. The right PICC line tip is in the right atrium. Because of the patient's size I can see the distal tip for certain. Persistent bibasilar atelectasis. Extensive calcifications noted around the right shoulder. IMPRESSION: The right PICC line is in the right atrium. A cannot see the distal tip for certain. Stable cardiac enlargement. Electronically Signed   By: Marijo Sanes M.D.   On: 07/01/2019 13:54   Ct Abdomen Pelvis W Contrast  Result Date: 07/01/2019 CLINICAL DATA:  Left upper and left lower quadrant abdominal pain. EXAM: CT ABDOMEN AND PELVIS WITH CONTRAST TECHNIQUE: Multidetector CT imaging of the abdomen and pelvis was performed using the standard protocol following bolus administration of intravenous contrast. CONTRAST:  52mL OMNIPAQUE IOHEXOL 300 MG/ML  SOLN COMPARISON:  CT scan 06/04/2019 FINDINGS: Examination limited by motion artifact. Lower chest: There are moderate-sized bilateral pleural effusions with overlying atelectasis. There is also a large pericardial effusion. This is new since the prior CT scan. Extensive coronary artery calcifications are noted. There is some contrast refluxing down the IVC and into the renal veins. Hepatobiliary: No focal hepatic lesions are identified but there is significant motion artifact. Gallbladder is abnormal. Gallbladder wall appears thickened and there is pericholecystic inflammatory changes. Possible findings of acute cholecystitis. Recommend  clinical correlation. Right upper quadrant ultrasound examination may be helpful for further evaluation. No definite common bile duct dilatation. Pancreas: No mass, inflammation or ductal dilatation. Spleen: Normal size.  No focal lesions. Adrenals/Urinary Tract: The adrenal glands are grossly normal. There are renal cysts and chronic renal cortical scarring changes. No hydronephrosis or worrisome mass. Stomach/Bowel: The stomach, duodenum, small bowel and  colon are grossly normal. No findings for small bowel obstruction or free air. There is a large amount of stool in the sigmoid colon and rectum suggesting fecal impaction. Vascular/Lymphatic: Advanced atherosclerotic calcifications involving the aorta and branch vessels but no aneurysm or dissection. The venous structures are patent. No retroperitoneal mass or adenopathy. Reproductive: The prostate gland and seminal vesicles are grossly normal. There is a Foley catheter in the bladder. Other: No free pelvic fluid collections or pelvic mass. There is a large left inguinal hernia containing fat and fluid. There is a moderate-sized right inguinal hernia containing fat and a small amount of fluid. Diffuse bulging anterior abdominal wall and a small amount of mesenteric edema and abdominal fluid. Musculoskeletal: Severe fatty atrophy of the pelvic and hip musculature and bilateral hip prostheses are noted. IMPRESSION: 1. Small to moderate-sized bilateral pleural effusions with overlying atelectasis. 2. Large pericardial effusion.  Cardiology consult suggested. 3. Abnormal CT appearance of the gallbladder. Suspect acute cholecystitis. Recommend clinical correlation and right upper quadrant ultrasound examination for further evaluation. 4. Large amount of stool in the rectum and sigmoid colon suggesting fecal impaction. These results will be called to the ordering clinician or representative by the Radiologist Assistant, and communication documented in the PACS or  [facility] Dashboard. Electronically Signed   By: Marijo Sanes M.D.   On: 07/01/2019 17:14   Dg Chest Port 1 View  Result Date: 07-24-2019 CLINICAL DATA:  Post CPR, NG tube and endotracheal tube placement EXAM: PORTABLE CHEST 1 VIEW COMPARISON:  07/01/2019 FINDINGS: Endotracheal tube is approximately 15 mm above the carina. NG tube appears to be looped in the cervical region. Cardiomegaly, vascular congestion. Bilateral airspace disease and layering effusions. Right PICC line remains in place, unchanged. IMPRESSION: Endotracheal tube tip 15 mm above the carina. NG tube appears to be located in the cervical region, likely coiling in the neck. This likely is to be completely removed and replaced. Diffuse airspace disease and layering effusions. Electronically Signed   By: Rolm Baptise M.D.   On: 24-Jul-2019 00:31   Dg Chest Port 1 View  Result Date: 06/28/2019 CLINICAL DATA:  Hypotension EXAM: PORTABLE CHEST 1 VIEW COMPARISON:  06/04/2019 FINDINGS: Cardiomegaly with vascular congestion. Large hiatal hernia. Left lower lobe atelectasis or infiltrate. No overt edema or definite significant effusions. No acute bony abnormality. IMPRESSION: Cardiomegaly.  Left lower lobe atelectasis or infiltrate. Large hiatal hernia. Electronically Signed   By: Rolm Baptise M.D.   On: 07/09/2019 19:25   [place] Ekg Site Rite  Result Date: 07/01/2019 If Site Rite image not attached, placement could not be confirmed due to current cardiac rhythm.   Assessment & Plan:  Bryce Berry is a 78 y.o. male with severe septic shock on multiple pressors. The patient's family is coming into discuss goals of care with Dr. Wynetta Emery and Palliative care. At this time, the patient is not a surgical candidate for his gallbladder. He may have cholecystitis but the edema could also be from back flow from a failing heart. I would continue antibiotics until the family makes their decisions.  IR would not be willing to do a cholecystostomy tube  with the INR 10, so this is likely not an option. Grim prognosis overall.   -Continue antibiotics -Agree with goals of care discussion   Updated Dr. Wynetta Emery.   Virl Cagey 24-Jul-2019, 4:01 PM  Patient seen earlier in the day and full consult note not completed. Brief note filled at 7:58AM when I saw the patient.

## 2019-07-17 NOTE — Progress Notes (Addendum)
Jul 07, 2019 8:57 AM  Pt seen at bedside, events of last night noted, he is on maximal pressor support on 4 pressors and intubated and BPs are only holding 40/20s.  After maximal medical support patient cannot survive this event.  I requested family be called in so that I can discuss the futility of continuing current management and to transfer patient to comfort care.  Will speak with family when they arrive.    Critical Care Procedure Note Authorized and Performed by: Murvin Natal MD  Total Critical Care time:  33 minutes  Due to a high probability of clinically significant, life threatening deterioration, the patient required my highest level of preparedness to intervene emergently and I personally spent this critical care time directly and personally managing the patient.  This critical care time included obtaining a history; examining the patient, pulse oximetry; ordering and review of studies; arranging urgent treatment with development of a management plan; evaluation of patient's response of treatment; frequent reassessment; and discussions with other providers.  This critical care time was performed to assess and manage the high probability of imminent and life threatening deterioration that could result in multi-organ failure.  It was exclusive of separately billable procedures and treating other patients and teaching time.    Murvin Natal MD

## 2019-07-17 NOTE — Consult Note (Signed)
Consultation Note Date: Jul 17, 2019   Patient Name: Bryce Berry  DOB: [birth year]  MRN: [id number]  Age / Sex: 78 y.o., male  PCP: Glenda Chroman, MD Referring Physician: Murlean Iba, MD  Reason for Consultation: Establishing goals of care  HPI/Patient Profile: 78 y.o. male  with past medical history of A. Fib on Xarelto, h/o PE/DVT, CAD s/p stents, pad, COPD, HTN, HLD, BPH, GERD, wheelchair bound admitted on 06/26/2019 with weakness and presyncope and found hypotensive and bradycardic. Concern for cholecystitis, pneumonia, large pericardial effusion. Coded overnight 07-16-2019. WBC rising and now 33.7, INR > 10, and severe acidosis on vent. Prognosis grim and he is actively dying. Family have been called in.   Clinical Assessment and Goals of Care: I have reviewed chart and discussed with RN. Bryce Berry has declined greatly overnight and s/p CPR. He is on max pressors with SBP only 40-50s this morning. He appears agonal on vent. Unresponsive. Family en-route to bedside. He became more bradycardic 30s and appeared to be trying to transition into idioventricular rhythm. I had RN administer 1 amp atropine which HR increased back to 50s and rhythm more regular. Family arrived to bedside and wife and niece (?) taking his hand. Wife is telling him goodbye and giving him permission to die and "meet Jesus."  RN, Dr. Wynetta Emery, and myself present at bedside for support.   Time of death 1020 asystole with myself, RN, Dr. Wynetta Emery and family at bedside. Emotional support and condolences provided to family at bedside. They are awaiting the arrival of more family members.   Primary Decision Maker NEXT OF KIN wife   Code Status/Advance Care Planning:  DNR upon family arrival   Symptom Management:   None  Prognosis:   Died  Discharge Planning: Died     Primary Diagnoses: Present on Admission: . Severe  sepsis with septic shock ([place]) . CAP (community acquired pneumonia) . Atrial fibrillation ([place]) . CAD (coronary artery disease) . History of pulmonary embolism . GERD (gastroesophageal reflux disease) . Hypertension . Acute renal injury ([place]) . COPD (chronic obstructive pulmonary disease) ([place]) . Essential hypertension . Elevated blood sugar . Chronic pain . Severe sepsis ([place])   I have reviewed the medical record, interviewed the patient and family, and examined the patient. The following aspects are pertinent.  Past Medical History:  Diagnosis Date  . Atrial fibrillation ([place])   . Atrial flutter ([place])    Possible history in the past - details unclear  . Bilateral pulmonary embolism ([place])    a. 10/2012 - V:Q scan high prob for Bilat PE in setting of dyspnea/hemoptysis-->anticoagulation started.  [place] Kitchen BPH (benign prostatic hyperplasia)   . CAD (coronary artery disease)    Stent PCI in [place] and also [place] in the past - details not clear  . Chronic anticoagulation   . Chronic pain   . 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 COPD (chronic obstructive pulmonary disease) ([place])   . Depression   . Dyslipidemia   . Essential  hypertension   . GERD (gastroesophageal reflux disease)   . GI bleed   . Gout   . Hyperlipidemia    Mixed  . Irritable bowel   . Osteoarthritis   . Osteoporosis   . Peripheral arterial disease ([place])    Dopplers Dr.Vyas office October, 2010, apparent occlusion anterior tibial arteries bilaterally., findings suggest hemodynamically significant stenosis of the proximal right provisional femoral artery and the mid left superficial artery.  Ankle-brachial indices indicate moderate stenosis within both lower extremities  . Posttraumatic stress disorder   . Recurrent UTI (urinary tract infection)   . Right leg DVT ([place])    a. 10/2012 - subocclusive DVT R popliteal vein.  . Wheelchair bound    Social History   Socioeconomic History  . Marital  status: Married    Spouse name: Not on file  . Number of children: Not on file  . Years of education: Not on file  . Highest education level: Not on file  Occupational History  . Occupation: Paediatric nurse: RETIRED    Comment: Retired  . Occupation: Vet    Comment: Retired  Scientific laboratory technician  . Financial resource strain: Not on file  . Food insecurity    Worry: Not on file    Inability: Not on file  . Transportation needs    Medical: Not on file    Non-medical: Not on file  Tobacco Use  . Smoking status: Former Smoker    Packs/day: 1.00    Years: 30.00    Pack years: 30.00    Types: Cigarettes    Start date: 08/31/1956    Quit date: 08/17/2003    Years since quitting: 15.8  . Smokeless tobacco: Never Used  . Tobacco comment: Patient smoked a pack a day for about 30 years  Substance and Sexual Activity  . Alcohol use: No    Alcohol/week: 0.0 standard drinks  . Drug use: No    Types: Other-see comments  . Sexual activity: Not on file  Lifestyle  . Physical activity    Days per week: Not on file    Minutes per session: Not on file  . Stress: Not on file  Relationships  . Social Herbalist on phone: Not on file    Gets together: Not on file    Attends religious service: Not on file    Active member of club or organization: Not on file    Attends meetings of clubs or organizations: Not on file    Relationship status: Not on file  Other Topics Concern  . Not on file  Social History Narrative   Lives with wife at home, [place].  Bedbound and mobilized wheelchair.  Two hours 5 days of week of HH aid.   1 child   Retired Geographical information systems officer      Family History  Problem Relation Age of Onset  . Diabetes Daughter   . Hypertension Daughter   . Obesity Daughter   . Cancer Maternal Grandmother    Scheduled Meds: . amantadine  100 mg Oral Daily  . atorvastatin  80 mg Oral QPM  . carbidopa-levodopa  1 tablet Oral BID  . Chlorhexidine Gluconate Cloth  6  each Topical Q0600  . diltiazem  120 mg Oral Daily  . DULoxetine  30 mg Oral Daily  . fenofibrate  160 mg Oral Daily  . finasteride  5 mg Oral Daily  . hydrocortisone sod succinate (SOLU-CORTEF) inj  50 mg  Intravenous Q6H  . influenza vaccine adjuvanted  0.5 mL Intramuscular Tomorrow-1000  . insulin aspart  0-15 Units Subcutaneous TID WC  . insulin aspart  0-5 Units Subcutaneous QHS  . mouth rinse  15 mL Mouth Rinse BID  . mupirocin ointment   Nasal BID  . pantoprazole  40 mg Oral BID  . senna  1 tablet Oral Daily  . sodium bicarbonate      . tamsulosin  0.4 mg Oral QPC supper  . umeclidinium-vilanterol  1 puff Inhalation Daily   Continuous Infusions: . sodium chloride Stopped (07/01/19 2156)  . sodium chloride 10 mL/hr at 2019/07/18 0847  . sodium chloride    . azithromycin Stopped (07/01/19 2325)  . ceFEPime (MAXIPIME) IV Stopped (07/01/19 2226)  . epinephrine 10 mcg/min (18-Jul-2019 0847)  . fentaNYL infusion INTRAVENOUS Stopped (07-18-2019 0606)  . norepinephrine (LEVOPHED) Adult infusion 40 mcg/min (07/18/2019 0247)  . phenylephrine (NEO-SYNEPHRINE) Adult infusion    . sodium bicarbonate 150 mEq in dextrose 5% 1000 mL 150 mL/hr at 07/18/19 0847  . sodium chloride 999 mL/hr at 07/01/19 1230  . vancomycin Stopped (07/01/19 2226)  . vasopressin (PITRESSIN) infusion - *FOR SHOCK* 0.03 Units/min (2019-07-18 0847)   PRN Meds:.sodium chloride, Place/Maintain arterial line **AND** sodium chloride, albuterol, benzonatate, dicyclomine, fentaNYL (SUBLIMAZE) injection, HYDROcodone-acetaminophen, hydrOXYzine, lidocaine, LORazepam, methocarbamol Allergies  Allergen Reactions  . Latex   . Omeprazole Other (See Comments)    Reaction unknown-possible upset stomach  . Other     Paper tape  . Penicillins Other (See Comments)    Has patient had a PCN reaction causing immediate rash, facial/tongue/throat swelling, SOB or lightheadedness with hypotension: No Has patient had a PCN reaction causing  severe rash involving mucus membranes or skin necrosis: No Has patient had a PCN reaction that required hospitalization No Has patient had a PCN reaction occurring within the last 10 years: No If all of the above answers are "NO", then may proceed with Cephalosporin use.    Rapid heart rate; flushed  . Sulfonamide Derivatives Other (See Comments)    Rapid heart rate; flushed  . Iodine Rash   Review of Systems  Unable to perform ROS: Acuity of condition    Physical Exam Vitals signs and nursing note reviewed.  Constitutional:      Appearance: He is ill-appearing and toxic-appearing.  Cardiovascular:     Rate and Rhythm: Bradycardia present. Rhythm irregularly irregular.  Pulmonary:     Comments: Agonal on full support vent Neurological:     Mental Status: He is unresponsive.     Vital Signs: BP (!) 48/12   Pulse 74   Temp 98.2 F (36.8 C)   Resp (!) 27   Ht 6\' 5"  (1.956 m)   Wt 109.5 kg   SpO2 (!) 84%   BMI 28.63 kg/m  Pain Scale: CPOT POSS *See Group Information*: 1-Acceptable,Awake and alert Pain Score: 10-Worst pain ever   SpO2: SpO2: (!) 84 % O2 Device:SpO2: (!) 84 % O2 Flow Rate: .O2 Flow Rate (L/min): 3 L/min  IO: Intake/output summary:   Intake/Output Summary (Last 24 hours) at 07-18-19 0908 Last data filed at Jul 18, 2019 0847 Gross per 24 hour  Intake 7079.87 ml  Output 85 ml  Net 6994.87 ml    LBM: Last BM Date: 07/01/19 Baseline Weight: Weight: 116.2 kg Most recent weight: Weight: 109.5 kg     Palliative Assessment/Data:     Time In/Out: [id number], 1000-1030 Time Total: 45 min Greater than 50%  of this time was  spent counseling and coordinating care related to the above assessment and plan.  Signed by: Vinie Sill, NP Palliative Medicine Team Pager # [phone] (M-F 8a-5p) Team Phone # [phone] (Nights/Weekends)

## 2019-07-17 NOTE — Consult Note (Signed)
Consult requested by: [facility] hospitalist, Dr. Wynetta Emery Consult requested for: Respiratory failure septic shock  HPI: This is a 78 year old who came to the hospital with weakness.  He was suddenly weak with presyncopal symptoms.  He became diaphoretic.  He was hypotensive when EMS arrived.  He was brought to the emergency department was found to be hypotensive and bradycardic.  He received atropine and IV fluids.  At baseline he is known to have atrial fib/atrial flutter history of pulmonary embolism and DVT coronary artery occlusive disease with stents BPH, COPD, hypertension, reflux, hyperlipidemia.  History is from the medical record is he has on mechanical ventilation.  Chest x-ray on admission showed left lower lobe infiltrate with a white blood count of 19,000.  He was admitted with severe sepsis with septic shock acute renal injury community-acquired pneumonia.  He was hypothermic and started on warming initially placed in stepdown started on pressors but continued deterioration through the day yesterday.  He had CT scan that showed a large fecal impaction large pericardial effusion possible acute cholecystitis.  He eventually had bradycardia then asystole.  He was intubated CPR was performed and advanced life support with epinephrine atropine calcium bicarbonate.  He has eventually had the addition of vasopressin epinephrine drip and bicarbonate drip.  He is unresponsive.  pH this morning was 7.13 this on the bicarbonate drip.  PCO2 was 20 PO2 of 144 this on 100% oxygen.  He was given another push of bicarbonate.  Lactate this morning was 4.8 procalcitonin 1.78 CO2 on comprehensive metabolic panel was 12 creatinine 1.73 white blood count 33,000. Past Medical History:  Diagnosis Date  . Atrial fibrillation ([place])   . Atrial flutter ([place])    Possible history in the past - details unclear  . Bilateral pulmonary embolism ([place])    a. 10/2012 - V:Q scan high prob for Bilat PE in setting of  dyspnea/hemoptysis-->anticoagulation started.  Marland Kitchen BPH (benign prostatic hyperplasia)   . CAD (coronary artery disease)    Stent PCI in [place] and also [place] in the past - details not clear  . Chronic anticoagulation   . Chronic pain   . 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 COPD (chronic obstructive pulmonary disease) ([place])   . Depression   . Dyslipidemia   . Essential hypertension   . GERD (gastroesophageal reflux disease)   . GI bleed   . Gout   . Hyperlipidemia    Mixed  . Irritable bowel   . Osteoarthritis   . Osteoporosis   . Peripheral arterial disease ([place])    Dopplers Dr.Vyas office October, 2010, apparent occlusion anterior tibial arteries bilaterally., findings suggest hemodynamically significant stenosis of the proximal right provisional femoral artery and the mid left superficial artery.  Ankle-brachial indices indicate moderate stenosis within both lower extremities  . Posttraumatic stress disorder   . Recurrent UTI (urinary tract infection)   . Right leg DVT ([place])    a. 10/2012 - subocclusive DVT R popliteal vein.  . Wheelchair bound      Family History  Problem Relation Age of Onset  . Diabetes Daughter   . Hypertension Daughter   . Obesity Daughter   . Cancer Maternal Grandmother      Social History   Socioeconomic History  . Marital status: Married    Spouse name: Not on file  . Number of children: Not on file  . Years of education: Not on file  . Highest education level: Not on file  Occupational History  .  Occupation: Paediatric nurse: RETIRED    Comment: Retired  . Occupation: Vet    Comment: Retired  Scientific laboratory technician  . Financial resource strain: Not on file  . Food insecurity    Worry: Not on file    Inability: Not on file  . Transportation needs    Medical: Not on file    Non-medical: Not on file  Tobacco Use  . Smoking status: Former Smoker    Packs/day: 1.00    Years: 30.00    Pack years: 30.00    Types:  Cigarettes    Start date: 08/31/1956    Quit date: 08/17/2003    Years since quitting: 15.8  . Smokeless tobacco: Never Used  . Tobacco comment: Patient smoked a pack a day for about 30 years  Substance and Sexual Activity  . Alcohol use: No    Alcohol/week: 0.0 standard drinks  . Drug use: No    Types: Other-see comments  . Sexual activity: Not on file  Lifestyle  . Physical activity    Days per week: Not on file    Minutes per session: Not on file  . Stress: Not on file  Relationships  . Social Herbalist on phone: Not on file    Gets together: Not on file    Attends religious service: Not on file    Active member of club or organization: Not on file    Attends meetings of clubs or organizations: Not on file    Relationship status: Not on file  Other Topics Concern  . Not on file  Social History Narrative   Lives with wife at home, [place].  Bedbound and mobilized wheelchair.  Two hours 5 days of week of HH aid.   1 child   Retired Nature conservation officer - [employer]        ROS: Unobtainable    Objective: Vital signs in last 24 hours: Temp:  [93.2 F (34 C)-99.6 F (37.6 C)] 98.4 F (36.9 C) (11/16 0738) Pulse Rate:  [25-102] 25 (11/15 2200) Resp:  [16-38] 28 (11/16 0738) BP: (35-120)/(18-89) 35/18 (11/16 0603) SpO2:  [89 %-100 %] 97 % (11/15 2200) Arterial Line BP: (55-79)/(50-68) 55/50 (11/16 0604) FiO2 (%):  [100 %] 100 % (11/16 0542) Weight change:  Last BM Date: 07/01/19  Intake/Output from previous day: 11/15 0701 - 11/16 0700 In: 3707.7 [I.V.:2686.4; IV Piggyback:1021.4] Out: 145 [Urine:145]  PHYSICAL EXAM Constitutional: He is unresponsive intubated and is bobbing his head.  Eyes pupils questionably reactive.  Ears nose mouth and throat: Mucous membranes are dry.  Cardiovascular: Heart is regular.  Heart sounds somewhat distant.  Respiratory: He is intubated on the ventilator and has bilateral rhonchi gastrointestinal: Abdomen is distended.  Musculoskeletal:  Unable to assess.  Neurological: Unable to assess psychiatric: Unable to assess  Lab Results: Basic Metabolic Panel: Recent Labs    07/01/19 2358 07-28-19 0441  NA 138 142  K 6.0* 4.7  CL 112* 114*  CO2 10* 12*  GLUCOSE 205* 139*  BUN 146* 99*  CREATININE 2.05* 1.73*  CALCIUM 8.2* 6.6*  MG  --  1.8   Liver Function Tests: Recent Labs    07/01/19 2358 28-Jul-2019 0441  AST 34 37  ALT 5 6  ALKPHOS 49 33*  BILITOT 0.8 1.1  PROT 4.6* 4.2*  ALBUMIN 1.8* 2.1*   No results for input(s): LIPASE, AMYLASE in the last 72 hours. No results for input(s): AMMONIA in the last 72 hours. CBC:  Recent Labs    07/13/2019 1910  07/01/19 2358 July 19, 2019 0441  WBC 19.9*   < > 26.9* 33.7*  NEUTROABS 15.8*  --   --  28.0*  HGB 10.5*   < > 10.3* 9.4*  HCT 34.2*   < > 34.1* 30.4*  MCV 97.7   < > 102.1* 97.7  PLT 262   < > 269 271   < > = values in this interval not displayed.   Cardiac Enzymes: No results for input(s): CKTOTAL, CKMB, CKMBINDEX, TROPONINI in the last 72 hours. BNP: No results for input(s): PROBNP in the last 72 hours. D-Dimer: No results for input(s): DDIMER in the last 72 hours. CBG: Recent Labs    07/12/2019 1826 07/01/19 0025  GLUCAP 192* 164*   Hemoglobin A1C: Recent Labs    06/26/2019 1854  HGBA1C 6.2*   Fasting Lipid Panel: No results for input(s): CHOL, HDL, LDLCALC, TRIG, CHOLHDL, LDLDIRECT in the last 72 hours. Thyroid Function Tests: No results for input(s): TSH, T4TOTAL, FREET4, T3FREE, THYROIDAB in the last 72 hours. Anemia Panel: No results for input(s): VITAMINB12, FOLATE, FERRITIN, TIBC, IRON, RETICCTPCT in the last 72 hours. Coagulation: Recent Labs    07/05/2019 1910 2019-07-19 0543  LABPROT 35.4* >90.0*  INR 3.6* >10.0*   Urine Drug Screen: Drugs of Abuse     Component Value Date/Time   LABOPIA POSITIVE (A) 07/01/2019 0824   COCAINSCRNUR NONE DETECTED 07/01/2019 0824   LABBENZ NONE DETECTED 07/01/2019 0824   AMPHETMU NONE DETECTED  07/01/2019 0824   THCU NONE DETECTED 07/01/2019 0824   LABBARB NONE DETECTED 07/01/2019 0824    Alcohol Level: No results for input(s): ETH in the last 72 hours. Urinalysis: Recent Labs    07/14/2019 1931  COLORURINE YELLOW  LABSPEC 1.016  PHURINE 5.0  GLUCOSEU NEGATIVE  HGBUR MODERATE*  BILIRUBINUR NEGATIVE  KETONESUR NEGATIVE  PROTEINUR NEGATIVE  NITRITE NEGATIVE  LEUKOCYTESUR TRACE*   Misc. Labs:   ABGS: Recent Labs    2019-07-19 0530  PHART 7.135*  PO2ART 144*  HCO3 9.1*     MICROBIOLOGY: Recent Results (from the past 240 hour(s))  SARS CORONAVIRUS 2 (TAT 6-24 HRS) Nasopharyngeal Nasopharyngeal Swab     Status: None   Collection Time: 06/21/2019  7:08 PM   Specimen: Nasopharyngeal Swab  Result Value Ref Range Status   SARS Coronavirus 2 NEGATIVE NEGATIVE Final    Comment: (NOTE) SARS-CoV-2 target nucleic acids are NOT DETECTED. The SARS-CoV-2 RNA is generally detectable in upper and lower respiratory specimens during the acute phase of infection. Negative results do not preclude SARS-CoV-2 infection, do not rule out co-infections with other pathogens, and should not be used as the sole basis for treatment or other patient management decisions. Negative results must be combined with clinical observations, patient history, and epidemiological information. The expected result is Negative. Fact Sheet for Patients: SugarRoll.be Fact Sheet for Healthcare Providers: https://www.woods-mathews.com/ This test is not yet approved or cleared by the [place] FDA and  has been authorized for detection and/or diagnosis of SARS-CoV-2 by FDA under an Emergency Use Authorization (EUA). This EUA will remain  in effect (meaning this test can be used) for the duration of the COVID-19 declaration under Section 56 4(b)(1) of the Act, 21 U.S.C. section 360bbb-3(b)(1), unless the authorization is terminated or revoked sooner. Performed  at [facility], [place] [address], [place], [place] [address]   Blood Culture (routine x 2)     Status: None (Preliminary result)   Collection Time:  06/24/2019  7:10 PM   Specimen: BLOOD RIGHT HAND  Result Value Ref Range Status   Specimen Description BLOOD RIGHT HAND  Final   Special Requests   Final    BOTTLES DRAWN AEROBIC ONLY Blood Culture adequate volume   Culture   Final    NO GROWTH 2 DAYS Performed at [facility], [address], [place], [place] [address]    Report Status PENDING  Incomplete  Blood Culture (routine x 2)     Status: None (Preliminary result)   Collection Time: 06/28/2019  7:25 PM   Specimen: BLOOD RIGHT WRIST  Result Value Ref Range Status   Specimen Description BLOOD RIGHT WRIST  Final   Special Requests   Final    BOTTLES DRAWN AEROBIC ONLY Blood Culture adequate volume   Culture   Final    NO GROWTH 2 DAYS Performed at [facility], [address] [place], [place] [address]    Report Status PENDING  Incomplete  MRSA PCR Screening     Status: Abnormal   Collection Time: 07/03/2019 10:34 PM   Specimen: Nasopharyngeal  Result Value Ref Range Status   MRSA by PCR POSITIVE (A) NEGATIVE Final    Comment:        The GeneXpert MRSA Assay (FDA approved for NASAL specimens only), is one component of a comprehensive MRSA colonization surveillance program. It is not intended to diagnose MRSA infection nor to guide or monitor treatment for MRSA infections. RESULT CALLED TO, READ BACK BY AND VERIFIED WITH: HYLTON L. @ [id number] ON TO:[id number] BY HENDERSON L. Performed at [facility], [address] [place], [place] [address]   Respiratory Panel by PCR     Status: None   Collection Time: 07/01/19  8:24 AM   Specimen: Nasopharyngeal Swab; Respiratory  Result Value Ref Range Status   Adenovirus NOT DETECTED NOT DETECTED Final   Coronavirus 229E NOT DETECTED NOT DETECTED Final    Comment: (NOTE) The Coronavirus on the Respiratory Panel, DOES NOT test for the  novel  Coronavirus (2019 nCoV)    Coronavirus HKU1 NOT DETECTED NOT DETECTED Final   Coronavirus NL63 NOT DETECTED NOT DETECTED Final   Coronavirus OC43 NOT DETECTED NOT DETECTED Final   Metapneumovirus NOT DETECTED NOT DETECTED Final   Rhinovirus / Enterovirus NOT DETECTED NOT DETECTED Final   Influenza A NOT DETECTED NOT DETECTED Final   Influenza B NOT DETECTED NOT DETECTED Final   Parainfluenza Virus 1 NOT DETECTED NOT DETECTED Final   Parainfluenza Virus 2 NOT DETECTED NOT DETECTED Final   Parainfluenza Virus 3 NOT DETECTED NOT DETECTED Final   Parainfluenza Virus 4 NOT DETECTED NOT DETECTED Final   Respiratory Syncytial Virus NOT DETECTED NOT DETECTED Final   Bordetella pertussis NOT DETECTED NOT DETECTED Final   Chlamydophila pneumoniae NOT DETECTED NOT DETECTED Final   Mycoplasma pneumoniae NOT DETECTED NOT DETECTED Final    Comment: Performed at [facility], [place] [address] [place], [place] [address]    Studies/Results: Dg Chest 1 View  Result Date: 07/01/2019 CLINICAL DATA:  PICC line placement. EXAM: CHEST  1 VIEW COMPARISON:  06/17/2019 FINDINGS: Stable massive cardiac enlargement and tortuous calcified thoracic aorta. The right PICC line tip is in the right atrium. Because of the patient's size I can see the distal tip for certain. Persistent bibasilar atelectasis. Extensive calcifications noted around the right shoulder. IMPRESSION: The right PICC line is in the right atrium. A cannot see the distal tip for certain. Stable cardiac enlargement.  Electronically Signed   By: Marijo Sanes M.D.   On: 07/01/2019 13:54   Ct Abdomen Pelvis W Contrast  Result Date: 07/01/2019 CLINICAL DATA:  Left upper and left lower quadrant abdominal pain. EXAM: CT ABDOMEN AND PELVIS WITH CONTRAST TECHNIQUE: Multidetector CT imaging of the abdomen and pelvis was performed using the standard protocol following bolus administration of intravenous contrast. CONTRAST:  49mL OMNIPAQUE IOHEXOL  300 MG/ML  SOLN COMPARISON:  CT scan 06/04/2019 FINDINGS: Examination limited by motion artifact. Lower chest: There are moderate-sized bilateral pleural effusions with overlying atelectasis. There is also a large pericardial effusion. This is new since the prior CT scan. Extensive coronary artery calcifications are noted. There is some contrast refluxing down the IVC and into the renal veins. Hepatobiliary: No focal hepatic lesions are identified but there is significant motion artifact. Gallbladder is abnormal. Gallbladder wall appears thickened and there is pericholecystic inflammatory changes. Possible findings of acute cholecystitis. Recommend clinical correlation. Right upper quadrant ultrasound examination may be helpful for further evaluation. No definite common bile duct dilatation. Pancreas: No mass, inflammation or ductal dilatation. Spleen: Normal size.  No focal lesions. Adrenals/Urinary Tract: The adrenal glands are grossly normal. There are renal cysts and chronic renal cortical scarring changes. No hydronephrosis or worrisome mass. Stomach/Bowel: The stomach, duodenum, small bowel and colon are grossly normal. No findings for small bowel obstruction or free air. There is a large amount of stool in the sigmoid colon and rectum suggesting fecal impaction. Vascular/Lymphatic: Advanced atherosclerotic calcifications involving the aorta and branch vessels but no aneurysm or dissection. The venous structures are patent. No retroperitoneal mass or adenopathy. Reproductive: The prostate gland and seminal vesicles are grossly normal. There is a Foley catheter in the bladder. Other: No free pelvic fluid collections or pelvic mass. There is a large left inguinal hernia containing fat and fluid. There is a moderate-sized right inguinal hernia containing fat and a small amount of fluid. Diffuse bulging anterior abdominal wall and a small amount of mesenteric edema and abdominal fluid. Musculoskeletal: Severe  fatty atrophy of the pelvic and hip musculature and bilateral hip prostheses are noted. IMPRESSION: 1. Small to moderate-sized bilateral pleural effusions with overlying atelectasis. 2. Large pericardial effusion.  Cardiology consult suggested. 3. Abnormal CT appearance of the gallbladder. Suspect acute cholecystitis. Recommend clinical correlation and right upper quadrant ultrasound examination for further evaluation. 4. Large amount of stool in the rectum and sigmoid colon suggesting fecal impaction. These results will be called to the ordering clinician or representative by the Radiologist Assistant, and communication documented in the PACS or zVision Dashboard. Electronically Signed   By: Marijo Sanes M.D.   On: 07/01/2019 17:14   Dg Chest Port 1 View  Result Date: 07-04-19 CLINICAL DATA:  Post CPR, NG tube and endotracheal tube placement EXAM: PORTABLE CHEST 1 VIEW COMPARISON:  07/01/2019 FINDINGS: Endotracheal tube is approximately 15 mm above the carina. NG tube appears to be looped in the cervical region. Cardiomegaly, vascular congestion. Bilateral airspace disease and layering effusions. Right PICC line remains in place, unchanged. IMPRESSION: Endotracheal tube tip 15 mm above the carina. NG tube appears to be located in the cervical region, likely coiling in the neck. This likely is to be completely removed and replaced. Diffuse airspace disease and layering effusions. Electronically Signed   By: Rolm Baptise M.D.   On: 07/04/2019 00:31   Dg Chest Port 1 View  Result Date: 07/05/2019 CLINICAL DATA:  Hypotension EXAM: PORTABLE CHEST 1 VIEW COMPARISON:  06/04/2019 FINDINGS:  Cardiomegaly with vascular congestion. Large hiatal hernia. Left lower lobe atelectasis or infiltrate. No overt edema or definite significant effusions. No acute bony abnormality. IMPRESSION: Cardiomegaly.  Left lower lobe atelectasis or infiltrate. Large hiatal hernia. Electronically Signed   By: Rolm Baptise M.D.   On:  06/20/2019 19:25   [place] Ekg Site Rite  Result Date: 07/01/2019 If Site Rite image not attached, placement could not be confirmed due to current cardiac rhythm.   Medications:  Prior to Admission:  Medications Prior to Admission  Medication Sig Dispense Refill Last Dose  . albuterol (PROVENTIL) (2.5 MG/3ML) 0.083% nebulizer solution Take 2.5 mg by nebulization every 6 (six) hours as needed for wheezing or shortness of breath.    06/29/2019 at Unknown time  . allopurinol (ZYLOPRIM) 100 MG tablet Take 100 mg by mouth every evening.    07/06/2019 at Unknown time  . ANORO ELLIPTA 62.5-25 MCG/INH AEPB Inhale 1 puff into the lungs daily.   06/29/2019 at Unknown time  . ascorbic acid (VITAMIN C) 500 MG tablet Take 500 mg by mouth daily.    07/01/2019 at Unknown time  . atorvastatin (LIPITOR) 80 MG tablet Take 80 mg by mouth every evening.    06/29/2019 at Unknown time  . benzonatate (TESSALON) 100 MG capsule Take 100 mg by mouth 3 (three) times daily as needed for cough.    07/07/2019 at Unknown time  . calcium-vitamin D (OSCAL WITH D) 500-200 MG-UNIT per tablet Take 1 tablet by mouth daily.    06/29/2019 at Unknown time  . carbidopa-levodopa (SINEMET IR) 25-100 MG tablet Take 1 tablet by mouth 2 (two) times a day.   07/14/2019 at Unknown time  . carvedilol (COREG) 12.5 MG tablet Take 12.5 mg by mouth 2 (two) times daily with a meal.   07/01/2019 at 1000  . COLCRYS 0.6 MG tablet Take 1 tablet by mouth daily as needed (gout pain).    06/28/2019 at Unknown time  . dicyclomine (BENTYL) 10 MG capsule Take 1 capsule (10 mg total) by mouth daily. 30 capsule 3 Past Week at Unknown time  . digoxin (LANOXIN) 0.125 MG tablet Take 1 tablet (0.125 mg total) by mouth daily. 90 tablet 2 Past Week at Unknown time  . diltiazem (CARDIZEM CD) 120 MG 24 hr capsule Take 1 capsule (120 mg total) by mouth daily. As directed based off blood pressure readings 90 capsule 2 07/09/2019 at Unknown time  . DULoxetine (CYMBALTA)  30 MG capsule Take 1 capsule by mouth daily.   07/09/2019 at Unknown time  . fenofibrate (TRICOR) 145 MG tablet Take 1 tablet (145 mg total) by mouth daily. (Patient taking differently: Take 145 mg by mouth every evening. ) 30 tablet 6 07/09/2019 at Unknown time  . fentaNYL (DURAGESIC - DOSED MCG/HR) 25 MCG/HR patch Place 25 mcg onto the skin every 3 (three) days.   Past Week at Unknown time  . ferrous sulfate 325 (65 FE) MG tablet Take 325 mg by mouth daily with breakfast.   07/01/2019 at Unknown time  . finasteride (PROSCAR) 5 MG tablet Take 5 mg by mouth daily.     06/29/2019 at Unknown time  . furosemide (LASIX) 40 MG tablet Take 40 mg by mouth every evening. *May take 80mg  as needed for swelling   06/19/2019 at Unknown time  . HYDROcodone-acetaminophen (NORCO) 10-325 MG tablet Take 1 tablet by mouth every 6 (six) hours as needed for moderate pain or severe pain.    06/29/2019 at Unknown time  .  hydrOXYzine (VISTARIL) 25 MG capsule Take 1 capsule by mouth 2 (two) times daily as needed.   06/29/2019 at Unknown time  . ipratropium-albuterol (DUONEB) 0.5-2.5 (3) MG/3ML SOLN Take 3 mLs by nebulization 3 (three) times daily. 360 mL 6 06/29/2019 at Unknown time  . lisinopril (PRINIVIL,ZESTRIL) 20 MG tablet Take 20 mg by mouth daily. Take one tablet by mouth if blood pressure is over 150   07/14/2019 at Unknown time  . LORazepam (ATIVAN) 0.5 MG tablet Take 0.5 mg by mouth daily as needed for anxiety.   06/18/2019 at Unknown time  . magnesium oxide (MAG-OX) 400 (241.3 MG) MG tablet Take 1 tablet (400 mg total) by mouth 2 (two) times daily. 60 tablet 2 06/29/2019 at Unknown time  . methocarbamol (ROBAXIN) 500 MG tablet Take 1 tablet by mouth 4 (four) times daily as needed.   07/01/2019 at Unknown time  . Multiple Vitamin (MULTIVITAMIN) tablet Take 1 tablet by mouth daily.     06/29/2019 at Unknown time  . Omega-3 Fatty Acids (FISH OIL) 1000 MG CAPS Take 2 capsules by mouth daily.    06/29/2019 at Unknown  time  . pantoprazole (PROTONIX) 40 MG tablet Take 40 mg by mouth 2 (two) times daily.    07/12/2019 at Unknown time  . potassium chloride SA (K-DUR) 20 MEQ tablet Take 1 tablet (20 mEq total) by mouth daily. 90 tablet 2 07/16/2019 at Unknown time  . PROAIR HFA 108 (90 Base) MCG/ACT inhaler Inhale 2 puffs into the lungs 2 (two) times daily as needed for wheezing or shortness of breath. 2 puffs twice daily   06/18/2019 at Unknown time  . rivaroxaban (XARELTO) 20 MG TABS tablet Take 1 tablet (20 mg total) by mouth daily. 30 tablet 10 06/18/2019 at 1000  . senna (SENOKOT) 8.6 MG TABS tablet Take 1 tablet (8.6 mg total) by mouth daily. 30 tablet 3 Past Week at Unknown time  . tamsulosin (FLOMAX) 0.4 MG CAPS capsule Take 0.4 mg by mouth daily after supper.   06/29/2019 at Unknown time  . Amantadine HCl 100 MG tablet Take 1 tablet by mouth daily.     . diclofenac sodium (VOLTAREN) 1 % GEL Apply 1 application topically daily. Applied to feet     . dicyclomine (BENTYL) 10 MG capsule Take 1 capsule (10 mg total) by mouth 2 (two) times daily as needed for up to 14 days for spasms. 30 capsule 0   . hydrALAZINE (APRESOLINE) 25 MG tablet Take 1 tablet by mouth 2 (two) times daily.     Marland Kitchen lidocaine (LIDODERM) 5 % Place 1 patch onto the skin daily as needed (for pain). Every 12 hours as needed     . metoprolol tartrate (LOPRESSOR) 50 MG tablet Take 50 mg by mouth 2 (two) times daily.     . nitroGLYCERIN (NITROSTAT) 0.4 MG SL tablet Place 1 tablet (0.4 mg total) under the tongue every 5 (five) minutes x 3 doses as needed. 25 tablet 3   . rivastigmine (EXELON) 9.5 mg/24hr APPLY 1 PATCH DAILY. 90 patch 6    Scheduled: . amantadine  100 mg Oral Daily  . atorvastatin  80 mg Oral QPM  . carbidopa-levodopa  1 tablet Oral BID  . Chlorhexidine Gluconate Cloth  6 each Topical Q0600  . diltiazem  120 mg Oral Daily  . DULoxetine  30 mg Oral Daily  . fenofibrate  160 mg Oral Daily  . finasteride  5 mg Oral Daily  .  hydrocortisone sod succinate (  SOLU-CORTEF) inj  50 mg Intravenous Q6H  . influenza vaccine adjuvanted  0.5 mL Intramuscular Tomorrow-1000  . insulin aspart  0-15 Units Subcutaneous TID WC  . insulin aspart  0-5 Units Subcutaneous QHS  . mouth rinse  15 mL Mouth Rinse BID  . mupirocin ointment   Nasal BID  . pantoprazole  40 mg Oral BID  . senna  1 tablet Oral Daily  . sodium bicarbonate      . tamsulosin  0.4 mg Oral QPC supper  . umeclidinium-vilanterol  1 puff Inhalation Daily   Continuous: . sodium chloride Stopped (07/01/19 2156)  . sodium chloride Stopped (07/01/19 2055)  . sodium chloride    . azithromycin 500 mg (07/01/19 2230)  . ceFEPime (MAXIPIME) IV 200 mL/hr at 07/01/19 2200  . epinephrine 3 mcg/min (07/04/2019 0741)  . fentaNYL infusion INTRAVENOUS 25 mcg/hr (07/04/19 0555)  . norepinephrine (LEVOPHED) Adult infusion 40 mcg/min (Jul 04, 2019 0247)  . phenylephrine (NEO-SYNEPHRINE) Adult infusion 80 mcg/min (07/04/2019 0458)  . sodium bicarbonate 150 mEq in dextrose 5% 1000 mL 150 mEq (04-Jul-2019 0702)  . sodium chloride 999 mL/hr at 07/01/19 1230  . vancomycin 166.7 mL/hr at 07/01/19 2200  . vasopressin (PITRESSIN) infusion - *FOR SHOCK* 0.03 Units/min (2019/07/04 0100)   [id number] chloride, Place/Maintain arterial line **AND** sodium chloride, albuterol, benzonatate, dicyclomine, fentaNYL (SUBLIMAZE) injection, HYDROcodone-acetaminophen, hydrOXYzine, lidocaine, LORazepam, methocarbamol  Assesment: He was admitted with severe sepsis with septic shock.  He has pneumonia and may also have cholecystitis.  He is on antibiotics.  He is on pressors and has had prolonged resuscitative efforts last night.  He has maxed out on all of the pressors and still has blood pressure in the 70s.  He remains acidemic with a pH of 7.13 this morning and bicarbonate on his metabolic panel of 12.  He is on a bicarb drip and has received supplemental bicarb as well.  He has acute renal injury  He is  critically and likely fatally ill Principal Problem:   Severe sepsis with septic shock ([place]) Active Problems:   Essential hypertension   Chronic pain   Atrial fibrillation (H[facility])   Acute renal injury ([place])   COPD (chronic obstructive pulmonary disease) (HCC)   GERD (gastroesophageal reflux disease)   Hypertension   CAD (coronary artery disease)   History of pulmonary embolism   CAP (community acquired pneumonia)   Elevated blood sugar   Severe sepsis ([place])   Encounter for hospice care    Plan: I do not think there is really anything to add.  Discussed with Dr. Wynetta Emery hospitalist attending.  He plans to discuss with patient's wife regarding medical futility at this point.    LOS: 2 days   Alonza Bogus 2019-07-04, 7:47 AM

## 2019-07-17 DEATH — deceased

## 2019-12-11 ENCOUNTER — Ambulatory Visit (INDEPENDENT_AMBULATORY_CARE_PROVIDER_SITE_OTHER): Payer: TRICARE For Life (TFL) | Admitting: Nurse Practitioner
